# Patient Record
Sex: Female | Born: 1950 | Race: White | Hispanic: No | Marital: Single | Smoking: Former smoker
Health system: Southern US, Community
[De-identification: ages and names within clinical notes are randomized; demographics above are authoritative.]

## PROBLEM LIST (undated history)

## (undated) DIAGNOSIS — F329 Major depressive disorder, single episode, unspecified: Secondary | ICD-10-CM

## (undated) DIAGNOSIS — Z780 Asymptomatic menopausal state: Secondary | ICD-10-CM

## (undated) DIAGNOSIS — C801 Malignant (primary) neoplasm, unspecified: Secondary | ICD-10-CM

## (undated) DIAGNOSIS — O24419 Gestational diabetes mellitus in pregnancy, unspecified control: Secondary | ICD-10-CM

## (undated) DIAGNOSIS — K5790 Diverticulosis of intestine, part unspecified, without perforation or abscess without bleeding: Secondary | ICD-10-CM

## (undated) DIAGNOSIS — Z9889 Other specified postprocedural states: Secondary | ICD-10-CM

## (undated) DIAGNOSIS — D225 Melanocytic nevi of trunk: Secondary | ICD-10-CM

## (undated) DIAGNOSIS — J45909 Unspecified asthma, uncomplicated: Secondary | ICD-10-CM

## (undated) DIAGNOSIS — E782 Mixed hyperlipidemia: Secondary | ICD-10-CM

## (undated) DIAGNOSIS — R112 Nausea with vomiting, unspecified: Secondary | ICD-10-CM

## (undated) DIAGNOSIS — Z7184 Encounter for health counseling related to travel: Secondary | ICD-10-CM

## (undated) DIAGNOSIS — E039 Hypothyroidism, unspecified: Secondary | ICD-10-CM

## (undated) DIAGNOSIS — L719 Rosacea, unspecified: Secondary | ICD-10-CM

## (undated) DIAGNOSIS — M8580 Other specified disorders of bone density and structure, unspecified site: Secondary | ICD-10-CM

## (undated) DIAGNOSIS — M5432 Sciatica, left side: Secondary | ICD-10-CM

## (undated) DIAGNOSIS — F32A Depression, unspecified: Secondary | ICD-10-CM

## (undated) DIAGNOSIS — F419 Anxiety disorder, unspecified: Secondary | ICD-10-CM

## (undated) DIAGNOSIS — C44310 Basal cell carcinoma of skin of unspecified parts of face: Secondary | ICD-10-CM

## (undated) DIAGNOSIS — D2370 Other benign neoplasm of skin of unspecified lower limb, including hip: Secondary | ICD-10-CM

## (undated) DIAGNOSIS — F909 Attention-deficit hyperactivity disorder, unspecified type: Secondary | ICD-10-CM

## (undated) HISTORY — DX: Rosacea, unspecified: L71.9

## (undated) HISTORY — DX: Attention-deficit hyperactivity disorder, unspecified type: F90.9

## (undated) HISTORY — PX: OTHER SURGICAL HISTORY: SHX169

## (undated) HISTORY — DX: Sciatica, left side: M54.32

## (undated) HISTORY — DX: Gestational diabetes mellitus in pregnancy, unspecified control: O24.419

## (undated) HISTORY — DX: Melanocytic nevi of trunk: D22.5

## (undated) HISTORY — DX: Asymptomatic menopausal state: Z78.0

## (undated) HISTORY — DX: Hypothyroidism, unspecified: E03.9

## (undated) HISTORY — PX: TONSILLECTOMY AND ADENOIDECTOMY: SUR1326

## (undated) HISTORY — DX: Depression, unspecified: F32.A

## (undated) HISTORY — DX: Major depressive disorder, single episode, unspecified: F32.9

## (undated) HISTORY — DX: Diverticulosis of intestine, part unspecified, without perforation or abscess without bleeding: K57.90

## (undated) HISTORY — DX: Other benign neoplasm of skin of unspecified lower limb, including hip: D23.70

## (undated) HISTORY — PX: BROW LIFT: SHX178

## (undated) HISTORY — PX: DILATION AND CURETTAGE OF UTERUS: SHX78

## (undated) HISTORY — DX: Malignant (primary) neoplasm, unspecified: C80.1

## (undated) HISTORY — DX: Unspecified asthma, uncomplicated: J45.909

## (undated) HISTORY — PX: SKIN CANCER EXCISION: SHX779

## (undated) HISTORY — DX: Anxiety disorder, unspecified: F41.9

## (undated) HISTORY — DX: Mixed hyperlipidemia: E78.2

## (undated) HISTORY — DX: Other specified disorders of bone density and structure, unspecified site: M85.80

## (undated) HISTORY — DX: Basal cell carcinoma of skin of unspecified parts of face: C44.310

## (undated) HISTORY — DX: Encounter for health counseling related to travel: Z71.84

---

## 1998-11-23 ENCOUNTER — Other Ambulatory Visit: Admission: RE | Admit: 1998-11-23 | Discharge: 1998-11-23 | Payer: Self-pay | Admitting: Family Medicine

## 1999-04-11 ENCOUNTER — Encounter: Admission: RE | Admit: 1999-04-11 | Discharge: 1999-04-11 | Payer: Self-pay | Admitting: Family Medicine

## 1999-04-11 ENCOUNTER — Encounter: Payer: Self-pay | Admitting: Family Medicine

## 1999-04-11 ENCOUNTER — Encounter: Payer: Self-pay | Admitting: Sports Medicine

## 2000-02-04 ENCOUNTER — Encounter (INDEPENDENT_AMBULATORY_CARE_PROVIDER_SITE_OTHER): Payer: Self-pay | Admitting: Specialist

## 2000-02-04 ENCOUNTER — Other Ambulatory Visit: Admission: RE | Admit: 2000-02-04 | Discharge: 2000-02-04 | Payer: Self-pay | Admitting: Gastroenterology

## 2000-03-04 ENCOUNTER — Other Ambulatory Visit: Admission: RE | Admit: 2000-03-04 | Discharge: 2000-03-04 | Payer: Self-pay | Admitting: *Deleted

## 2000-03-04 ENCOUNTER — Encounter (INDEPENDENT_AMBULATORY_CARE_PROVIDER_SITE_OTHER): Payer: Self-pay | Admitting: Specialist

## 2001-01-05 ENCOUNTER — Other Ambulatory Visit: Admission: RE | Admit: 2001-01-05 | Discharge: 2001-01-05 | Payer: Self-pay | Admitting: Family Medicine

## 2002-02-04 ENCOUNTER — Other Ambulatory Visit: Admission: RE | Admit: 2002-02-04 | Discharge: 2002-02-04 | Payer: Self-pay | Admitting: Family Medicine

## 2002-02-04 ENCOUNTER — Encounter: Admission: RE | Admit: 2002-02-04 | Discharge: 2002-02-04 | Payer: Self-pay | Admitting: Family Medicine

## 2002-02-04 ENCOUNTER — Encounter: Payer: Self-pay | Admitting: Family Medicine

## 2003-03-17 ENCOUNTER — Other Ambulatory Visit: Admission: RE | Admit: 2003-03-17 | Discharge: 2003-03-17 | Payer: Self-pay | Admitting: Family Medicine

## 2004-11-22 ENCOUNTER — Other Ambulatory Visit: Admission: RE | Admit: 2004-11-22 | Discharge: 2004-11-22 | Payer: Self-pay | Admitting: Family Medicine

## 2005-07-10 ENCOUNTER — Ambulatory Visit: Payer: Self-pay | Admitting: Gastroenterology

## 2005-07-24 ENCOUNTER — Ambulatory Visit: Payer: Self-pay | Admitting: Gastroenterology

## 2008-04-25 ENCOUNTER — Other Ambulatory Visit: Admission: RE | Admit: 2008-04-25 | Discharge: 2008-04-25 | Payer: Self-pay | Admitting: Internal Medicine

## 2008-05-04 ENCOUNTER — Ambulatory Visit (HOSPITAL_COMMUNITY): Admission: RE | Admit: 2008-05-04 | Discharge: 2008-05-04 | Payer: Self-pay | Admitting: Internal Medicine

## 2008-12-14 ENCOUNTER — Ambulatory Visit: Payer: Self-pay | Admitting: Sports Medicine

## 2008-12-14 DIAGNOSIS — M722 Plantar fascial fibromatosis: Secondary | ICD-10-CM | POA: Insufficient documentation

## 2008-12-14 DIAGNOSIS — Q6689 Other  specified congenital deformities of feet: Secondary | ICD-10-CM | POA: Insufficient documentation

## 2008-12-14 DIAGNOSIS — M7660 Achilles tendinitis, unspecified leg: Secondary | ICD-10-CM | POA: Insufficient documentation

## 2009-03-15 ENCOUNTER — Ambulatory Visit: Payer: Self-pay | Admitting: Sports Medicine

## 2009-03-15 DIAGNOSIS — M2040 Other hammer toe(s) (acquired), unspecified foot: Secondary | ICD-10-CM | POA: Insufficient documentation

## 2009-03-15 HISTORY — DX: Other hammer toe(s) (acquired), unspecified foot: M20.40

## 2009-04-06 ENCOUNTER — Ambulatory Visit (HOSPITAL_COMMUNITY): Admission: RE | Admit: 2009-04-06 | Discharge: 2009-04-06 | Payer: Self-pay | Admitting: Internal Medicine

## 2009-04-17 ENCOUNTER — Ambulatory Visit: Payer: Self-pay | Admitting: Sports Medicine

## 2009-04-17 DIAGNOSIS — M7750 Other enthesopathy of unspecified foot: Secondary | ICD-10-CM | POA: Insufficient documentation

## 2010-03-06 ENCOUNTER — Ambulatory Visit: Payer: Self-pay | Admitting: Sports Medicine

## 2010-03-06 DIAGNOSIS — M25559 Pain in unspecified hip: Secondary | ICD-10-CM | POA: Insufficient documentation

## 2010-05-29 NOTE — Assessment & Plan Note (Signed)
Summary: 1/2 MARATHON IN DEC/RT. BACK, HIP KNEE   Vital Signs:  Patient profile:   60 year old female Height:      66 inches Weight:      170 pounds BMI:     27.54 BP sitting:   119 / 80  Vitals Entered By: Lillia Pauls [name] (March 06, 2010 9:02 AM)   History of Present Illness: 60 yo female here for for right sided gluteal pain and left sided hip weakness.  pt has been increasing her exercise regimen  more recently helping her become prepared for a 1/2 marathon she has in December. Pt states at the end of running on the days she runs she will have right sided gluteal pain the seems to cause some stiffness but usually imprves the next day or so.  Pt states she has had this for a while and denies that the pain is getting any worse. Pt though would like to know what it is and see if anyway to help it.  Pt states the pain seems mildly improved with orthotics and pt goes to intergrated therapy multiple times a week. New problem left sided hip weakness started after a 11 mile run on saturday. Pt states did not start til after the run and pt was at the store and noticed some trouble lifting the left leg while walking and then through the night.  Pt states on monday had more strength but then had some pain in the left hip and a little bit in the back.  Denies any pain shooting down the leg, numbness, tingling loss of function or bowel or bladder control. Of note pt does have hx of left sided bulging disc seen on MRI in 2003 at L4-L5  Preventive Screening-Counseling & Management  Alcohol-Tobacco     Smoking Status: never  Current Medications (verified): 1)  None  Allergies (verified): No Known Drug Allergies  Past History:  Past medical, surgical, family and social histories (including risk factors) reviewed, and no changes noted (except as noted below).  Family History: Reviewed history and no changes required.  Social History: Reviewed history and no changes required. Smoking  Status:  never  Review of Systems       denies fever, chills, nausea, vomiting, diarrhea or constipation   Physical Exam  General:  Well-developed,well-nourished,in no acute distress; alert,appropriate and cooperative throughout examination Msk:  right hip tight hamstrings with tender to palpation at insertion of hamstrings.  No swelling or discolration no pain with internal or external rotation of the hip, 5/5 strength, NVI, DTR intact, abduction and adduction good strength. Left hip:  no pain with internal or external rotation, mild + Faber, no gross deformity, negative SLT, mild focal finding of weakness of 4/5 strength with abduction, otherwise NVI.  widening of forefoot drop of MT arch bilat there is splaying of toes 1 and 2 There is some hammering of toes 4 and 5  review of Xrays did show a fusion at L1/2 but more degenerative change at L4/5 asnd 5/S1   Impression & Recommendations:  Problem # 1:  PAIN IN JOINT PELVIC REGION AND THIGH (ICD-719.45) likely due to poor hip abductor/gluteal medius strength.  No red flags seen that would think of back .  Pt likely ocmpensating on right side wich is giving tightening of quadratus laborum and hamstrings as well.  Pt already wearing custom orthotics, gave pt exercises to do to help strengthen muscles and told pt to continue integrated therapy.  Pt can  continue to do her same running regimen but Sacred need to back off on conditioning she has weekly for a little time. NSAIDS as needed.   Pt to f/u as needed    Orders Added: 1)  Est. Patient Level III [16109]

## 2010-07-11 ENCOUNTER — Encounter: Payer: Self-pay | Admitting: Gastroenterology

## 2010-07-17 NOTE — Letter (Signed)
Summary: Colonoscopy Letter  [facility]  [address] [place], [place] [address]   Phone: [phone]  Fax: [phone]      July 11, 2010 MRN: [id number]   Shannon [name] [address] [place], [place]  [address]   Dear [name],   According to your medical record, it is time for you to schedule a Colonoscopy. The American Cancer Society recommends this procedure as a method to detect early colon cancer. Patients with a family history of colon cancer, or a personal history of colon polyps or inflammatory bowel disease are at increased risk.  This letter has been generated based on the recommendations made at the time of your procedure. If you feel that in your particular situation this [place]ag[place] no longer apply, please contact our office.  Please call our office at [phone] to schedule this appointment or to update your records at your earliest convenience.  Thank you for cooperating with [place] to provide you with the very best care possible.   Sincerely,  Judie Petit T. Russella Dar, M.D.  [facility] Gastroenterology Division [phone]

## 2010-07-26 ENCOUNTER — Encounter (INDEPENDENT_AMBULATORY_CARE_PROVIDER_SITE_OTHER): Payer: Self-pay | Admitting: *Deleted

## 2010-07-31 NOTE — Letter (Signed)
Summary: Pre Visit Letter Revised  [facility]  [address] [place], [place] [address]   Phone: [phone]  Fax: [phone]        07/26/2010 MRN: [id number] Shannon [name] [address] [place], [place]  [address]             Procedure Date:  09/13/2010 @ 9:30   Recall colon-Dr. Russella Dar   Welcome to the Gastroenterology Division at [facility].    You are scheduled to see a nurse for your pre-procedure visit on 09/03/2010 at 2:00 on the 3rd floor at [facility], [address]  We ask that you try to arrive at our office 15 minutes prior to your appointment time to allow for check-in.  Please take a minute to review the attached form.  If you answer "Yes" to one or more of the questions on the first page, we ask that you call the person listed at your earliest opportunity.  If you answer "No" to all of the questions, please complete the rest of the form and bring it to your appointment.    Your nurse visit will consist of discussing your medical and surgical history, your immediate family medical history, and your medications.   If you are unable to list all of your medications on the form, please bring the medication bottles to your appointment and we will list them.  We will need to be aware of both prescribed and over the counter drugs.  We will need to know exact dosage information as well.    Please be prepared to read and sign documents such as consent forms, a financial agreement, and acknowledgement forms.  If necessary, and with your consent, a friend or relative is welcome to sit-in on the nurse visit with you.  Please bring your insurance card so that we Kadie make a copy of it.  If your insurance requires a referral to see a specialist, please bring your referral form from your primary care physician.  No co-pay is required for this nurse visit.     If you cannot keep your appointment, please call [phone] to cancel or reschedule prior to your  appointment date.  This allows [place] the opportunity to schedule an appointment for another patient in need of care.    Thank you for choosing [facility] for your medical needs.  We appreciate the opportunity to care for you.  Please visit [place] at our website  to learn more about our practice.  Sincerely, The Gastroenterology Division

## 2010-08-20 ENCOUNTER — Ambulatory Visit (AMBULATORY_SURGERY_CENTER): Payer: BC Managed Care – PPO | Admitting: *Deleted

## 2010-08-20 VITALS — Ht 66.0 in | Wt 179.2 lb

## 2010-08-20 DIAGNOSIS — Z8371 Family history of colonic polyps: Secondary | ICD-10-CM

## 2010-08-20 DIAGNOSIS — Z800 Family history of malignant neoplasm of digestive organs: Secondary | ICD-10-CM

## 2010-08-20 MED ORDER — PEG-KCL-NACL-NASULF-NA ASC-C 100 G PO SOLR: ORAL | Status: DC

## 2010-08-21 ENCOUNTER — Encounter: Payer: Self-pay | Admitting: Gastroenterology

## 2010-09-05 ENCOUNTER — Encounter: Payer: Self-pay | Admitting: Gastroenterology

## 2010-09-06 ENCOUNTER — Ambulatory Visit (AMBULATORY_SURGERY_CENTER): Payer: BC Managed Care – PPO | Admitting: Gastroenterology

## 2010-09-06 ENCOUNTER — Encounter: Payer: Self-pay | Admitting: Gastroenterology

## 2010-09-06 ENCOUNTER — Other Ambulatory Visit: Payer: Self-pay | Admitting: Gastroenterology

## 2010-09-06 ENCOUNTER — Other Ambulatory Visit (INDEPENDENT_AMBULATORY_CARE_PROVIDER_SITE_OTHER): Payer: BC Managed Care – PPO

## 2010-09-06 DIAGNOSIS — R197 Diarrhea, unspecified: Secondary | ICD-10-CM

## 2010-09-06 DIAGNOSIS — Z860100 Personal history of colon polyps, unspecified: Secondary | ICD-10-CM

## 2010-09-06 DIAGNOSIS — Z800 Family history of malignant neoplasm of digestive organs: Secondary | ICD-10-CM

## 2010-09-06 DIAGNOSIS — K5730 Diverticulosis of large intestine without perforation or abscess without bleeding: Secondary | ICD-10-CM

## 2010-09-06 DIAGNOSIS — Z1211 Encounter for screening for malignant neoplasm of colon: Secondary | ICD-10-CM

## 2010-09-06 MED ORDER — SODIUM CHLORIDE 0.9 % IV SOLN: 500.0000 mL | INTRAVENOUS | Status: DC

## 2010-09-06 NOTE — Patient Instructions (Signed)
Resume all medications. Information given for high fiber diet and diverticulosis

## 2010-09-06 NOTE — Progress Notes (Signed)
Pressure applied to abdomen to reach cecum.  

## 2010-09-07 ENCOUNTER — Telehealth: Payer: Self-pay | Admitting: *Deleted

## 2010-09-07 LAB — GLIA (IGA/G) + TTG IGA
Gliadin IgA: 7 U/mL (ref <20)
Gliadin IgG: 5.2 U/mL (ref <20)

## 2010-09-07 NOTE — Telephone Encounter (Signed)
Follow up Call- Patient questions:  Do you have a fever, pain , or abdominal swelling? no Pain Score  0 *  Have you tolerated food without any problems? yes  Have you been able to return to your normal activities? yes  Do you have any questions about your discharge instructions: Diet   no Medications  no Follow up visit  no  Do you have questions or concerns about your Care? yes   Patient states that she thought that she was discharged too soon.  States could barely walk when she got home.  Supervisor notified.  Actions: * If pain score is 4 or above: No action needed, pain <4.

## 2010-09-13 ENCOUNTER — Other Ambulatory Visit: Payer: Self-pay | Admitting: Gastroenterology

## 2010-09-13 ENCOUNTER — Telehealth: Payer: Self-pay | Admitting: Gastroenterology

## 2010-09-13 NOTE — Telephone Encounter (Signed)
Left message for patient to call back  

## 2010-09-14 NOTE — Telephone Encounter (Signed)
All questions about celiac disease answered.  She will call back for further questions or concerns

## 2010-09-14 NOTE — Telephone Encounter (Signed)
Left message for patient to call back  

## 2010-09-26 ENCOUNTER — Ambulatory Visit (INDEPENDENT_AMBULATORY_CARE_PROVIDER_SITE_OTHER): Payer: BC Managed Care – PPO | Admitting: Sports Medicine

## 2010-09-26 ENCOUNTER — Encounter: Payer: Self-pay | Admitting: Sports Medicine

## 2010-09-26 VITALS — BP 123/80

## 2010-09-26 DIAGNOSIS — M25561 Pain in right knee: Secondary | ICD-10-CM

## 2010-09-26 DIAGNOSIS — M25559 Pain in unspecified hip: Secondary | ICD-10-CM

## 2010-09-26 DIAGNOSIS — M2040 Other hammer toe(s) (acquired), unspecified foot: Secondary | ICD-10-CM

## 2010-09-26 DIAGNOSIS — M7750 Other enthesopathy of unspecified foot: Secondary | ICD-10-CM

## 2010-09-26 DIAGNOSIS — M25569 Pain in unspecified knee: Secondary | ICD-10-CM

## 2010-09-26 HISTORY — DX: Pain in unspecified knee: M25.569

## 2010-09-26 NOTE — Assessment & Plan Note (Signed)
I reassured her that I saw no signs of arthritis of the left hip. I did encourage her to restart exercises for the hip to include abduction rotation and step up exercises.  She also needs to continue doing stretches to loosen mostly hip rotation and the SI joints.  Recheck this in 2-3 months.

## 2010-09-26 NOTE — Assessment & Plan Note (Signed)
Please correlate with her transverse arch breakdown. We will add support to her sports orthotics. Consider making a dress orthotic for other shoes. I did not feel that surgery wasn't an option at this point.

## 2010-09-26 NOTE — Assessment & Plan Note (Signed)
Metatarsal cookie tried to give water support for the foot when she is with metatarsal pad. If this works well I think we should have metatarsal cookies to her sandals and other shoes.

## 2010-09-26 NOTE — Progress Notes (Signed)
  Subjective:    Patient ID: Shannon [name], female    DOB: 11/03/1950, 60 y.o.   MRN: [id number]  HPI  Pt here for evaluation of rt knee pain which goes across anterior portion knee.  Pain started around the same time she began cycling 6 years ago.  Only bothers her sometimes with cycling and if her form is not good when working legs in the gym.   She is planning a bike trip across country March and April of 2013.  Completed 1/2 marathon last fall.   Knee pain is worse with running.  Her other issue is left hip pain. This did resolve with a series of exercises she was given a visit last year. She has not been doing the exercises recently and the pain is a similar pattern. It starts on the outside of the left hip and sometimes radiates down the outer leg.  Third issue is that she has bilateral hammertoes. Her feet have felt better since being put in orthotics 2 years ago. However she only uses those were running in in her regular walking shoes and biking she she's getting some pressure over the feet. Metatarsal pad to help relieve some of the stress.  Review of Systems     Objective:   Physical Exam     Bilat cavus foot Hammering of toes 4 and 5 bilat but L > R Loss of transverse arch L > R  Weak abduction bilat Full flexion and extension of knees bilat Good quad strength RT Knee: Normal to inspection with no erythema or effusion or obvious bony abnormalities. Palpation normal with no warmth or joint line tenderness or patellar tenderness or condyle tenderness. ROM normal in flexion and extension and lower leg rotation. Ligaments with solid consistent endpoints including ACL, PCL, LCL, MCL. Negative Mcmurray's and provocative meniscal tests. Non painful patellar compression. Patellar and quadriceps tendons unremarkable. Hamstring and quadriceps strength is normal. Crepitation over rt knee with bending   Full hip flexion on lt Normal IR and ER rt and left hip Good hip flexion  strength bilaterally Adductor strength good SI joint moves better on rt than lt     Assessment & Plan:

## 2010-09-26 NOTE — Assessment & Plan Note (Signed)
I think the recurrence of right anterior knee pain is also related to her hip abduction weakness. She does need good arch support in all of her shoes and that probably contributes as well. She is given exercises for both the hip and the quadriceps.

## 2010-09-26 NOTE — Patient Instructions (Signed)
Do suggested hip exercises on both sides daily- abduction, rotation, and step up exercises. Pick 2 stretches for buttocks and low back area and do these daily

## 2011-03-26 ENCOUNTER — Ambulatory Visit (INDEPENDENT_AMBULATORY_CARE_PROVIDER_SITE_OTHER): Payer: BC Managed Care – PPO | Admitting: Sports Medicine

## 2011-03-26 VITALS — BP 100/60

## 2011-03-26 DIAGNOSIS — M7750 Other enthesopathy of unspecified foot: Secondary | ICD-10-CM

## 2011-03-26 NOTE — Assessment & Plan Note (Signed)
Her cycling shoes appear to be high-quality inappropriate that. She needs metatarsal pads in both shoes. Plan to add metatarsal cookie on the left shoe and metatarsal pad on the right shoe. We'll add this to the insoles that come with the cycling shoes. This calms we'll try this for 2-3 weeks and return for custom orthotics if she feels like she is not getting sufficient comfort.  We will followup as needed prior to this long bike ride

## 2011-03-26 NOTE — Patient Instructions (Signed)
Thank you for coming in today. Wear the pads in your bike shoes.  If it fits well stick it down.  Ride and see how it goes for 2 weeks.  Keep stuffing your shoes at night.  If what we did does not work come back.  Come back for orthotics soon.

## 2011-03-26 NOTE — Progress Notes (Signed)
[name] presents to clinic today for left foot pain with or cycling shoes. She is an active cyclist. She plans to right across [place] in March and April of 2013.  She recently had a new bite fit and purchased some new bicycling shoes.  She noted after 40 mile ride the other day she had some left forefoot pain along her metatarsal head. She comes to clinic today to wonder if she needs orthotics for her cycling shoes. She does have custom cushioned orthotics in her running shoes with metatarsal pads.    PMH reviewed.  ROS as above otherwise neg Medications reviewed. Current Outpatient Prescriptions  Medication Sig Dispense Refill  . Azelaic Acid (FINACEA) 15 % cream Apply topically 2 (two) times daily. After skin is thoroughly washed and patted dry, gently but thoroughly massage a thin film of azelaic acid cream into the affected area twice daily, in the morning and evening.       . Diclofenac Sodium (SOLARAZE) 3 % GEL Place onto the skin. 2xday / 3 month topically       . L-Glutamic Acid HCl POWD by Does not apply route. By mouth daily       . Levothyroxine Sodium (TIROSINT) 100 MCG CAPS Take by mouth. By mouth daily       . Cholecalciferol (VITAMIN D) 2000 UNITS CAPS Take by mouth daily.        . [employer]S BIOTIN PO Take 10,000 mcg by mouth daily.        [name] dextroamphetamine (DEXTROSTAT) 10 MG tablet Take 10 mg by mouth as needed. ADD       . escitalopram (LEXAPRO) 5 MG tablet Take 5 mg by mouth daily.        . Fish Oil OIL by Does not apply route daily. 1 tsp daily       . levothyroxine (SYNTHROID, LEVOTHROID) 75 MCG tablet Take 75 mcg by mouth daily.        . Probiotic Product (PROBIOTIC PO) Take 1 capsule by mouth daily.        [name] tretinoin (RETIN-A) 0.025 % cream Apply topically at bedtime.        [name] UNABLE TO FIND Take 1 capsule by mouth 2 (two) times daily. Med Name: GABA 100-200 mg       . UNABLE TO FIND Med Name: Iodoral 12.5 mg. Every other day (supplement)        Current  Facility-Administered Medications  Medication Dose Route Frequency Provider Last Rate Last Dose  . 0.9 %  sodium chloride infusion  500 mL Intravenous Continuous Eliezer Bottom., MD,FACG        Exam:  BP 100/60 Gen: Well NAD MSK: Collapse of the transverse arch with splayed toes bilaterally. Some tenderness to palpation along the metatarsal heads.   She has a very broad forefoot bilaterally She also has more lateral changes of early hammertoes

## 2011-03-27 NOTE — Progress Notes (Signed)
Addended by: Annita Brod on: 03/27/2011 02:41 PM   Modules accepted: Orders

## 2011-03-28 ENCOUNTER — Encounter: Payer: Self-pay | Admitting: Gastroenterology

## 2011-03-28 ENCOUNTER — Ambulatory Visit (INDEPENDENT_AMBULATORY_CARE_PROVIDER_SITE_OTHER): Payer: BC Managed Care – PPO | Admitting: Gastroenterology

## 2011-03-28 VITALS — BP 102/70 | HR 68 | Ht 66.0 in | Wt 168.0 lb

## 2011-03-28 DIAGNOSIS — L309 Dermatitis, unspecified: Secondary | ICD-10-CM

## 2011-03-28 DIAGNOSIS — K5900 Constipation, unspecified: Secondary | ICD-10-CM

## 2011-03-28 DIAGNOSIS — L259 Unspecified contact dermatitis, unspecified cause: Secondary | ICD-10-CM

## 2011-03-28 NOTE — Progress Notes (Signed)
History of Present Illness: This is a 60 year old female who relates a several year history of recurrent perianal irritation and rashes. She states she has been diagnosed with candida and skin fissure by her PCP, dermatologist and naturalist. Her symptoms have responded to steroid creams and anti-candidal creams at various times in the past. She has no perianal symptoms at this time. She brings with her food allergy testing and stool assays of digestion/absorption, gut immunity and gut metabolic function ordered by her naturalist in [place]. Calprotectin and pancreatic elastase 1 levels were normal. She notes a frequent difficulty in fully evacuating her stool. She underwent colonoscopy in [place] 2012 that  showed a tortuous and redundant colon to the ascending colon with mild diverticulosis and no other abnormalities. Denies weight loss, abdominal pain, diarrhea, change in stool caliber, melena, hematochezia, nausea, vomiting, dysphagia, reflux symptoms, chest pain.  Current Medications, Allergies, Past Medical History, Past Surgical History, Family History and Social History were reviewed in [employer] record.  Physical Exam: General: Well developed , well nourished, no acute distress Head: Normocephalic and atraumatic Eyes:  sclerae anicteric, EOMI Ears: Normal auditory acuity Mouth: No deformity or lesions Lungs: Clear throughout to auscultation Heart: Regular rate and rhythm; no murmurs, rubs or bruits Abdomen: Soft, non tender and non distended. No masses, hepatosplenomegaly or hernias noted. Normal Bowel sounds Rectal: Normal perianal skin, no external lesions, no internal lesions, Hemoccult-negative brown stool in the vault Musculoskeletal: Symmetrical with no gross deformities  Pulses:  Normal pulses noted Extremities: No clubbing, cyanosis, edema or deformities noted Neurological: Alert oriented x 4, grossly nonfocal Psychological:  Alert and cooperative. Normal mood  and affect  Assessment and Recommendations:  1. Recurrent perianal dermatitis. Her exam is normal today and she is asymptomatic. I advised her to followup with her dermatologist for recurrent symptoms as this appear to be a skin disorder.  2. Mild chronic constipation. Increase dietary fiber and water intake.

## 2011-03-28 NOTE — Patient Instructions (Addendum)
Continue current medications. cc: Lucky Cowboy, MD

## 2011-03-29 ENCOUNTER — Encounter: Payer: Self-pay | Admitting: Gastroenterology

## 2011-04-16 ENCOUNTER — Ambulatory Visit (INDEPENDENT_AMBULATORY_CARE_PROVIDER_SITE_OTHER): Payer: BC Managed Care – PPO | Admitting: Sports Medicine

## 2011-04-16 VITALS — BP 104/60

## 2011-04-16 DIAGNOSIS — M7750 Other enthesopathy of unspecified foot: Secondary | ICD-10-CM

## 2011-04-16 NOTE — Progress Notes (Signed)
  Subjective:    Patient ID: Shannon [name], female    DOB: 1951-02-11, 60 y.o.   MRN: [id number]  HPI Patient returns for custom orthotics to use on her bike shoes She plans right across the country and she is getting foot pain she rides at present She has good relief with her custom orthotics and walking and running shoes but she got doing much of running activity at this point In the back she has some metatarsalgia has been worse Her Achilles tendinitis and plantar fasciitis have pretty much resolved   Review of Systems     Objective:   Physical Exam No acute distress  Both forefeet show significant breakdown There are hammertoes laterally left more so than right Left transverse arch is fully collapsed but the metatarsal heads are mobile Right transverse arch is collapsed as well Right metatarsal heads 3 and 4 soon to be subluxed plantar area through the capsule Tenderness to palpation on standing or pressure on the forefoot       Assessment & Plan:

## 2011-04-16 NOTE — Assessment & Plan Note (Signed)
With chronic forefoot pain we decided to make her a special orthotic to set her bike shoes to see if we can lessen this She will continue to use orthotics in her walking and running shoes  Patient was fitted for a standard, cushioned, semi-rigid orthotic.  The orthotic was heated and the patient stood on the orthotic blank positioned on the orthotic stand. The patient was positioned in subtalar neutral position and 10 degrees of ankle dorsiflexion in a weight bearing stance. After molding, a stable [employer] EVA base was applied to the orthotic blank.   The blank was ground to a stable position for weight bearing. Size:8 dress orthotic blank base: [place] [employer]: none additional orthotic padding: small MT pads bilat onto orthotic surface  These felt comfortable after completion She will test them with biking to see if they will take the pressure off fore foot

## 2011-09-11 ENCOUNTER — Ambulatory Visit (INDEPENDENT_AMBULATORY_CARE_PROVIDER_SITE_OTHER): Payer: BC Managed Care – PPO | Admitting: Sports Medicine

## 2011-09-11 ENCOUNTER — Encounter: Payer: Self-pay | Admitting: Sports Medicine

## 2011-09-11 VITALS — BP 114/76 | HR 48 | Ht 66.0 in | Wt 164.0 lb

## 2011-09-11 DIAGNOSIS — M79671 Pain in right foot: Secondary | ICD-10-CM

## 2011-09-11 DIAGNOSIS — M79609 Pain in unspecified limb: Secondary | ICD-10-CM

## 2011-09-11 MED ORDER — MELOXICAM 15 MG PO TABS: ORAL_TABLET | ORAL | Status: DC

## 2011-09-11 MED ORDER — CAPSAICIN 0.1 % EX CREA: TOPICAL_CREAM | CUTANEOUS | Status: DC

## 2011-09-11 NOTE — Assessment & Plan Note (Addendum)
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  (1st MTP/medial cuneiform) NSAIDS prn. Topical capsaicin. Arch strap vs Ankle sleeve. RTC and inject 1st MT/medial cuneiform joint under guidance if no better.  Pt desires additional orthotics, we can have her f/u for this.

## 2011-09-11 NOTE — Progress Notes (Signed)
  Subjective:    Patient ID: Shannon [name], female    DOB: Jul 30, 1950, 61 y.o.   MRN: [id number]  HPI Has several issues to discuss, main issue is 10 months pain over RT TMT joint dorsum after inversion injury.  Hasn't tried any other tx for this.  Has custom orthotics that are comfortable.  Described as burning.  No numbness on plantar foot.  She completed bike ride x [place] Amount of burning is affected by shoe type   Review of Systems    No fevers, chills, night sweats, weight loss, chest pain, or shortness of breath.  Objective:   Physical Exam General:  Well developed, well nourished, and in no acute distress. Neuro:  Alert and oriented x3, extra-ocular muscles intact. Skin: Warm and dry, no rashes noted. Respiratory:  Not using accessory muscles, speaking in full sentences. Right  Ankle: No visible erythema or swelling. Range of motion is full in all directions. Strength is 5/5 in all directions. Stable lateral and medial ligaments; squeeze test and kleiger test unremarkable; Talar dome nontender; No pain at base of 5th MT; No tenderness over cuboid; No tenderness over N spot or navicular prominence No tenderness on posterior aspects of lateral and medial malleolus No sign of peroneal tendon subluxations; Negative tarsal tunnel tinel's Able to walk 4 steps.   TTP RT 1st MT/medial cuneiform joint.   TMT bossing.   Cavus foot.     Assessment & Plan:

## 2011-09-11 NOTE — Patient Instructions (Signed)
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

## 2012-03-10 ENCOUNTER — Encounter: Payer: Self-pay | Admitting: Sports Medicine

## 2012-03-10 ENCOUNTER — Ambulatory Visit (INDEPENDENT_AMBULATORY_CARE_PROVIDER_SITE_OTHER): Payer: BC Managed Care – PPO | Admitting: Sports Medicine

## 2012-03-10 VITALS — BP 117/79 | HR 54

## 2012-03-10 DIAGNOSIS — M7750 Other enthesopathy of unspecified foot: Secondary | ICD-10-CM

## 2012-03-10 DIAGNOSIS — M79673 Pain in unspecified foot: Secondary | ICD-10-CM

## 2012-03-10 DIAGNOSIS — M79609 Pain in unspecified limb: Secondary | ICD-10-CM

## 2012-03-10 NOTE — Progress Notes (Signed)
Patient ID: Shannon [name], female   DOB: Apr 28, 1951, 60 y.o.   MRN: [id number]  Patient returns for Bilateral foot pain Orthotics have worked well for past 2 yrs Now these are not keeping her forefoot from hurting after walking or jogging MT pads still help  PF sxs have stayed in control as long as she uses orthotics  AT is OK now  P Exam Bilateral forefoot pain on deep palpation over MTs No abnormal calluses now Standing shows marked loww of transverse arch bilat Splaying of toes with left > RT Hammertoes laterally  Standings shows loss of long arch

## 2012-03-10 NOTE — Assessment & Plan Note (Signed)
New orthotics created  Patient was fitted for a : standard, cushioned, semi-rigid orthotic. The orthotic was heated and afterward the patient stood on the orthotic blank positioned on the orthotic stand. The patient was positioned in subtalar neutral position and 10 degrees of ankle dorsiflexion in a weight bearing stance. After completion of molding, a stable base was applied to the orthotic blank. The blank was ground to a stable position for weight bearing. Size: 9 red EVA Base: blue EVA Posting: MT padding Additional orthotic padding: o  Time 40 mins  Walking gait was comfortable in these and in neutral position

## 2012-03-10 NOTE — Assessment & Plan Note (Signed)
New MT pads added to orthotics

## 2012-04-25 ENCOUNTER — Ambulatory Visit (INDEPENDENT_AMBULATORY_CARE_PROVIDER_SITE_OTHER): Payer: BC Managed Care – PPO | Admitting: Family Medicine

## 2012-04-25 VITALS — BP 116/73 | HR 73 | Temp 99.7°F | Resp 18 | Ht 66.5 in | Wt 181.0 lb

## 2012-04-25 DIAGNOSIS — J111 Influenza due to unidentified influenza virus with other respiratory manifestations: Secondary | ICD-10-CM

## 2012-04-25 DIAGNOSIS — R059 Cough, unspecified: Secondary | ICD-10-CM

## 2012-04-25 DIAGNOSIS — R05 Cough: Secondary | ICD-10-CM

## 2012-04-25 DIAGNOSIS — R509 Fever, unspecified: Secondary | ICD-10-CM

## 2012-04-25 LAB — POCT INFLUENZA A/B
Influenza A, POC: POSITIVE
Influenza B, POC: NEGATIVE

## 2012-04-25 MED ORDER — HYDROCODONE-HOMATROPINE 5-1.5 MG/5ML PO SYRP: ORAL_SOLUTION | ORAL | Status: DC

## 2012-04-25 MED ORDER — OSELTAMIVIR PHOSPHATE 75 MG PO CAPS: 75.0000 mg | ORAL_CAPSULE | Freq: Two times a day (BID) | ORAL | Status: DC

## 2012-04-25 NOTE — Progress Notes (Signed)
Subjective:    Patient ID: Shannon [name], female    DOB: August 27, 1950, 61 y.o.   MRN: [id number]  HPI Shannon [name] is a 61 y.o. female Started 2 nights ago - cough, burning with cough.  Fever yesterday with chills and bodyaches.  Sick contacts possible at resort recently. Was staying at resort recently, dtr who is pregnant - 6 months pregnant, and 2 and 1/61 yo dtr.  No flu shot this year.    Review of Systems  Constitutional: Positive for fever and chills.  Respiratory: Positive for cough. Negative for shortness of breath.   Neurological: Positive for headaches.      Objective:   Physical Exam  Vitals reviewed. Constitutional: She is oriented to person, place, and time. She appears well-developed and well-nourished. No distress.  HENT:  Head: Normocephalic and atraumatic.  Right Ear: Hearing, tympanic membrane, external ear and ear canal normal.  Left Ear: Hearing, tympanic membrane, external ear and ear canal normal.  Nose: Nose normal.  Mouth/Throat: Oropharynx is clear and moist. No oropharyngeal exudate.  Eyes: Conjunctivae normal and EOM are normal. Pupils are equal, round, and reactive to light.  Cardiovascular: Normal rate, regular rhythm, normal heart sounds and intact distal pulses.   No murmur heard. Pulmonary/Chest: Effort normal and breath sounds normal. No respiratory distress. She has no wheezes. She has no rhonchi.  Neurological: She is alert and oriented to person, place, and time.  Skin: Skin is warm and dry. No rash noted.  Psychiatric: She has a normal mood and affect. Her behavior is normal.      Results for orders placed in visit on 04/25/12  POCT INFLUENZA A/B      Component Value Range   Influenza A, POC Positive     Influenza B, POC Negative         Assessment & Plan:  Shannon [name] is a 61 y.o. female 1. Fever, unspecified  POCT Influenza A/B  2. Cough  POCT Influenza A/B, HYDROcodone-homatropine (HYCODAN) 5-1.5 MG/5ML syrup  3. Influenza  oseltamivir  (TAMIFLU) 75 MG capsule   Sx care, tamiflu, mucinex or delsym during day. Hycodan at night if needed. rtc precautions discussed.  Also advised about dtr and granddtr possible prophylaxis to discuss with their care providers.   Patient Instructions  Start mucinex or mucinex dm as needed for cough, cough syrup at night if needed. Start tamiflu tonight. Return to the clinic or go to the nearest emergency room if any of your symptoms worsen or new symptoms occur. Have your daughter discuss with her obgyn and granddaughter's provider whether prophylaxis with a flu medicine is needed.    Influenza, Adult Influenza ("the flu") is a viral infection of the respiratory tract. It occurs more often in winter months because people spend more time in close contact with one another. Influenza can make you feel very sick. Influenza easily spreads from person to person (contagious). CAUSES  Influenza is caused by a virus that infects the respiratory tract. You can catch the virus by breathing in droplets from an infected person's cough or sneeze. You can also catch the virus by touching something that was recently contaminated with the virus and then touching your mouth, nose, or eyes. SYMPTOMS  Symptoms typically last 4 to 10 days and Bailynn include:  Fever.  Chills.  Headache, body aches, and muscle aches.  Sore throat.  Chest discomfort and cough.  Poor appetite.  Weakness or feeling tired.  Dizziness.  Nausea or vomiting. DIAGNOSIS  Diagnosis of influenza is often made based on your history and a physical exam. A nose or throat swab test can be done to confirm the diagnosis. RISKS AND COMPLICATIONS You Yajaira be at risk for a more severe case of influenza if you smoke cigarettes, have diabetes, have chronic heart disease (such as heart failure) or lung disease (such as asthma), or if you have a weakened immune system. Elderly people and pregnant women are also at risk for more serious infections. The  most common complication of influenza is a lung infection (pneumonia). Sometimes, this complication can require emergency medical care and Addalyn be life-threatening. PREVENTION  An annual influenza vaccination (flu shot) is the best way to avoid getting influenza. An annual flu shot is now routinely recommended for all adults in the [place] TREATMENT  In mild cases, influenza goes away on its own. Treatment is directed at relieving symptoms. For more severe cases, your caregiver Maddux prescribe antiviral medicines to shorten the sickness. Antibiotic medicines are not effective, because the infection is caused by a virus, not by bacteria. HOME CARE INSTRUCTIONS  Only take over-the-counter or prescription medicines for pain, discomfort, or fever as directed by your caregiver.  Use a cool mist humidifier to make breathing easier.  Get plenty of rest until your temperature returns to normal. This usually takes 3 to 4 days.  Drink enough fluids to keep your urine clear or pale yellow.  Cover your mouth and nose when coughing or sneezing, and wash your hands well to avoid spreading the virus.  Stay home from work or school until your fever has been gone for at least 1 full day. SEEK MEDICAL CARE IF:   You have chest pain or a deep cough that worsens or produces more mucus.  You have nausea, vomiting, or diarrhea. SEEK IMMEDIATE MEDICAL CARE IF:   You have difficulty breathing, shortness of breath, or your skin or nails turn bluish.  You have severe neck pain or stiffness.  You have a severe headache, facial pain, or earache.  You have a worsening or recurring fever.  You have nausea or vomiting that cannot be controlled. MAKE SURE YOU:  Understand these instructions.  Will watch your condition.  Will get help right away if you are not doing well or get worse. Document Released: 04/12/2000 Document Revised: 10/15/2011 Document Reviewed: 07/15/2011 [facility] Patient Information 2013  [place], [place].

## 2012-04-25 NOTE — Patient Instructions (Addendum)
Start mucinex or mucinex dm as needed for cough, cough syrup at night if needed. Start tamiflu tonight. Return to the clinic or go to the nearest emergency room if any of your symptoms worsen or new symptoms occur. Have your daughter discuss with her obgyn and granddaughter's provider whether prophylaxis with a flu medicine is needed.    Influenza, Adult Influenza ("the flu") is a viral infection of the respiratory tract. It occurs more often in winter months because people spend more time in close contact with one another. Influenza can make you feel very sick. Influenza easily spreads from person to person (contagious). CAUSES  Influenza is caused by a virus that infects the respiratory tract. You can catch the virus by breathing in droplets from an infected person's cough or sneeze. You can also catch the virus by touching something that was recently contaminated with the virus and then touching your mouth, nose, or eyes. SYMPTOMS  Symptoms typically last 4 to 10 days and Kordelia include:  Fever.  Chills.  Headache, body aches, and muscle aches.  Sore throat.  Chest discomfort and cough.  Poor appetite.  Weakness or feeling tired.  Dizziness.  Nausea or vomiting. DIAGNOSIS  Diagnosis of influenza is often made based on your history and a physical exam. A nose or throat swab test can be done to confirm the diagnosis. RISKS AND COMPLICATIONS You Faryn be at risk for a more severe case of influenza if you smoke cigarettes, have diabetes, have chronic heart disease (such as heart failure) or lung disease (such as asthma), or if you have a weakened immune system. Elderly people and pregnant women are also at risk for more serious infections. The most common complication of influenza is a lung infection (pneumonia). Sometimes, this complication can require emergency medical care and Cristyn be life-threatening. PREVENTION  An annual influenza vaccination (flu shot) is the best way to avoid getting  influenza. An annual flu shot is now routinely recommended for all adults in the [place] TREATMENT  In mild cases, influenza goes away on its own. Treatment is directed at relieving symptoms. For more severe cases, your caregiver Jozalynn prescribe antiviral medicines to shorten the sickness. Antibiotic medicines are not effective, because the infection is caused by a virus, not by bacteria. HOME CARE INSTRUCTIONS  Only take over-the-counter or prescription medicines for pain, discomfort, or fever as directed by your caregiver.  Use a cool mist humidifier to make breathing easier.  Get plenty of rest until your temperature returns to normal. This usually takes 3 to 4 days.  Drink enough fluids to keep your urine clear or pale yellow.  Cover your mouth and nose when coughing or sneezing, and wash your hands well to avoid spreading the virus.  Stay home from work or school until your fever has been gone for at least 1 full day. SEEK MEDICAL CARE IF:   You have chest pain or a deep cough that worsens or produces more mucus.  You have nausea, vomiting, or diarrhea. SEEK IMMEDIATE MEDICAL CARE IF:   You have difficulty breathing, shortness of breath, or your skin or nails turn bluish.  You have severe neck pain or stiffness.  You have a severe headache, facial pain, or earache.  You have a worsening or recurring fever.  You have nausea or vomiting that cannot be controlled. MAKE SURE YOU:  Understand these instructions.  Will watch your condition.  Will get help right away if you are not doing well or get worse.  Document Released: 04/12/2000 Document Revised: 10/15/2011 Document Reviewed: 07/15/2011 [facility] Patient Information 2013 [place], [place].

## 2012-04-26 ENCOUNTER — Telehealth: Payer: Self-pay

## 2012-04-26 NOTE — Telephone Encounter (Signed)
Patient advised.

## 2012-04-26 NOTE — Telephone Encounter (Signed)
PT WOULD LIKE TO KNOW WHAT KIND OF FLU SHE HAD AND HOW LONG SHE IS GOING TO BE CONTAIGIOUS [phone]

## 2012-04-26 NOTE — Telephone Encounter (Signed)
1wk  Was positive for Flu A

## 2012-04-26 NOTE — Telephone Encounter (Signed)
LMOM to CB. 

## 2012-06-13 ENCOUNTER — Other Ambulatory Visit: Payer: Self-pay

## 2012-07-23 DIAGNOSIS — H2513 Age-related nuclear cataract, bilateral: Secondary | ICD-10-CM | POA: Insufficient documentation

## 2012-07-23 HISTORY — DX: Age-related nuclear cataract, bilateral: H25.13

## 2012-09-08 ENCOUNTER — Ambulatory Visit (INDEPENDENT_AMBULATORY_CARE_PROVIDER_SITE_OTHER): Payer: BC Managed Care – PPO | Admitting: Sports Medicine

## 2012-09-08 ENCOUNTER — Encounter: Payer: Self-pay | Admitting: Sports Medicine

## 2012-09-08 VITALS — BP 121/84 | HR 52 | Ht 66.0 in | Wt 176.0 lb

## 2012-09-08 DIAGNOSIS — S83207A Unspecified tear of unspecified meniscus, current injury, left knee, initial encounter: Secondary | ICD-10-CM | POA: Insufficient documentation

## 2012-09-08 DIAGNOSIS — M79609 Pain in unspecified limb: Secondary | ICD-10-CM

## 2012-09-08 DIAGNOSIS — M79671 Pain in right foot: Secondary | ICD-10-CM

## 2012-09-08 DIAGNOSIS — IMO0002 Reserved for concepts with insufficient information to code with codable children: Secondary | ICD-10-CM

## 2012-09-08 MED ORDER — MELOXICAM 15 MG PO TABS: ORAL_TABLET | ORAL | Status: DC

## 2012-09-08 NOTE — Progress Notes (Signed)
  Subjective:    Patient ID: Shannon [name], female    DOB: 08/03/1950, 62 y.o.   MRN: [id number]  Knee Pain     1. Left knee swelling. Noted some intermittent swelling on the medial aspect of left knee. Started just after completing a half marathon in December. Overall is slightly improved, but still flares with cycling, squats, or increased physical activity. There is not pain associated in this area. She does note some bilateral knee cap pain when standing on her bike or climbing stairs.  Denies popping, locking or instabilty, redness, fever, decreased ROM.   Review of Systems See HPI otherwise negative.    Objective:   Physical Exam  Musculoskeletal:  Knee ROM intact bilaterally.  Trace edema at medial joint line and at pes anserine area. Moderate left medial joint line tenderness.  Bilateral positive patellar compression test, crepitus noted. No excess joint laxity. No calf tenderness. No warmth, erythema.     MSK [place]: quad and patellar tendon intact. No effusion. Medial/posterior meniscus shows fragmentation and tear with pseudocyst formation. Pes anserine bursa without significant edema.      Assessment & Plan:

## 2012-09-08 NOTE — Patient Instructions (Addendum)
Do home exercises for strengthening to off load the mensicus. 1. Knee flexion limit to 30-45 degree 2. Lateral leg raises 3. Straight leg raises. Can add ankle weights when confident. 4. Quad raises.  Use compression sleeve for knee. Ice would help. Avoid running for one month at least.  Avoid standing on the bike and using high gears.

## 2012-09-08 NOTE — Assessment & Plan Note (Addendum)
Seen on [place] and explains current symptoms. Likely caused by distance running or forceful cycling. Discussed hip/quad strengthening exercises. Recommend compression sleeve. Modification of activities, to avoid running and cycling at high gears at least one month.   Reck in 6 to 8 weeks

## 2013-02-21 ENCOUNTER — Encounter (HOSPITAL_COMMUNITY): Payer: Self-pay | Admitting: Emergency Medicine

## 2013-02-21 ENCOUNTER — Emergency Department (HOSPITAL_COMMUNITY)
Admission: EM | Admit: 2013-02-21 | Discharge: 2013-02-21 | Disposition: A | Discharge status: Home / Self Care | Payer: BC Managed Care – PPO | Attending: Emergency Medicine | Admitting: Emergency Medicine

## 2013-02-21 DIAGNOSIS — Y9289 Other specified places as the place of occurrence of the external cause: Secondary | ICD-10-CM | POA: Insufficient documentation

## 2013-02-21 DIAGNOSIS — F909 Attention-deficit hyperactivity disorder, unspecified type: Secondary | ICD-10-CM | POA: Insufficient documentation

## 2013-02-21 DIAGNOSIS — F3289 Other specified depressive episodes: Secondary | ICD-10-CM | POA: Insufficient documentation

## 2013-02-21 DIAGNOSIS — Y9389 Activity, other specified: Secondary | ICD-10-CM | POA: Insufficient documentation

## 2013-02-21 DIAGNOSIS — F411 Generalized anxiety disorder: Secondary | ICD-10-CM | POA: Insufficient documentation

## 2013-02-21 DIAGNOSIS — Z789 Other specified health status: Secondary | ICD-10-CM | POA: Insufficient documentation

## 2013-02-21 DIAGNOSIS — H43399 Other vitreous opacities, unspecified eye: Secondary | ICD-10-CM | POA: Insufficient documentation

## 2013-02-21 DIAGNOSIS — H43391 Other vitreous opacities, right eye: Secondary | ICD-10-CM

## 2013-02-21 DIAGNOSIS — F329 Major depressive disorder, single episode, unspecified: Secondary | ICD-10-CM | POA: Insufficient documentation

## 2013-02-21 DIAGNOSIS — Z79899 Other long term (current) drug therapy: Secondary | ICD-10-CM | POA: Insufficient documentation

## 2013-02-21 DIAGNOSIS — Z85820 Personal history of malignant melanoma of skin: Secondary | ICD-10-CM | POA: Insufficient documentation

## 2013-02-21 DIAGNOSIS — IMO0002 Reserved for concepts with insufficient information to code with codable children: Secondary | ICD-10-CM | POA: Insufficient documentation

## 2013-02-21 DIAGNOSIS — Z8719 Personal history of other diseases of the digestive system: Secondary | ICD-10-CM | POA: Insufficient documentation

## 2013-02-21 DIAGNOSIS — Z87891 Personal history of nicotine dependence: Secondary | ICD-10-CM | POA: Insufficient documentation

## 2013-02-21 NOTE — ED Notes (Signed)
Pt hit right eye last night aND denies pain at this time but visual issues.

## 2013-02-21 NOTE — ED Provider Notes (Signed)
Medical screening examination/treatment/procedure(s) were performed by non-physician practitioner and as supervising physician I was immediately available for consultation/collaboration.   Kemesha Mosey T Coben Godshall, MD 02/21/13 2319 

## 2013-02-21 NOTE — ED Provider Notes (Signed)
CSN: [id number]     Arrival date & time 02/21/13  1703 History  This chart was scribed for non-physician practitioner Emilia Beck, PA-C working with Toy Baker, MD by Joaquin Music, ED Scribe. This patient was seen in room WTR8/WTR8 and the patient's care was started at 5:30 PM .    Chief Complaint  Patient presents with  . Eye Injury    right   The history is provided by the patient. No language interpreter was used.   HPI Comments: Shannon [name] is a 62 y.o. female who presents to the Emergency Department complaining of R eye injury onset 24 hours. Pt states she hit her eye with her car sun visor 24 hours ago. Pt states she wears eye contacts. She states she has visual disturbance to the R eye that she describes as "black dots and lines". She states the visual disturbances affect her ability to read. She states she put her contacts in today to see if the visual disturbances would change. She states "there was no change". She describes the visual disturbances as "somebody taking a marker and writing on your glasses. That is what I see". Pt is concerned with a torn retina. Pt denies pain in area. She denies ever having visual disturbances. Pt denies any other injuries. Her current Ophthalmologist is Dr. Julian Reil in [place], [place].  Pt states she has a hx of hypothyroid and depression.    Past Medical History  Diagnosis Date  . Anxiety   . Cancer     melanoma- lip 2011  . Depression   . ADD (attention deficit disorder with hyperactivity)   . Diverticulosis    Past Surgical History  Procedure Laterality Date  . Tonsillectomy and adenoidectomy    . Dilation and curettage of uterus     Family History  Problem Relation Age of Onset  . Colon polyps Mother   . Colon cancer Maternal Grandfather    History  Substance Use Topics  . Smoking status: Former Smoker    Quit date: 08/20/1970  . Smokeless tobacco: Never Used  . Alcohol Use: 0.0 oz/week    1-2 Glasses of  wine per week     Comment: rare   OB History   Grav Para Term Preterm Abortions TAB SAB Ect Mult Living                 Review of Systems  Eyes: Positive for visual disturbance. Negative for pain, discharge, redness and itching.  Musculoskeletal: Negative for neck stiffness.    Allergies  Review of patient's allergies indicates no known allergies.  Home Medications   Current Outpatient Rx  Name  Route  Sig  Dispense  Refill  . Azelaic Acid (FINACEA) 15 % cream   Topical   Apply topically 2 (two) times daily. After skin is thoroughly washed and patted dry, gently but thoroughly massage a thin film of azelaic acid cream into the affected area twice daily, in the morning and evening.          . Cholecalciferol (VITAMIN D) 2000 UNITS CAPS   Oral   Take by mouth daily.          . Coenzyme Q10 (CO Q 10) 100 MG CAPS   Oral   Take 1 capsule by mouth daily.         [name] dextroamphetamine (DEXTROSTAT) 10 MG tablet   Oral   Take 10 mg by mouth as needed. ADD          .  escitalopram (LEXAPRO) 5 MG tablet   Oral   Take 5 mg by mouth daily.           [name] levothyroxine (SYNTHROID, LEVOTHROID) 75 MCG tablet   Oral   Take 75 mcg by mouth daily before breakfast.         . Probiotic Product (PROBIOTIC PO)   Oral   Take 1 capsule by mouth daily.            Triage Vitals:BP 111/63  Pulse 56  Temp(Src) 98.1 F (36.7 C)  Resp 16  SpO2 98%  Physical Exam  Nursing note and vitals reviewed. Constitutional: She is oriented to person, place, and time. She appears well-developed and well-nourished. No distress.  HENT:  Head: Normocephalic and atraumatic.  Eyes: Conjunctivae and EOM are normal. Pupils are equal, round, and reactive to light. No scleral icterus.  Fundoscopic exam difficult to assess. No conjunctival injection.   Neck: Normal range of motion.  Cardiovascular: Normal rate and regular rhythm.   Pulmonary/Chest: Effort normal. No respiratory distress.   Musculoskeletal: Normal range of motion.  Neurological: She is alert and oriented to person, place, and time.  Skin: Skin is warm and dry.  Psychiatric: She has a normal mood and affect. Her behavior is normal.    ED Course  Procedures  DIAGNOSTIC STUDIES: Oxygen Saturation is 98% on RA, normal by my interpretation.    COORDINATION OF CARE: 5:36 PM-Discussed treatment plan which includes consult with attending provider for further evaluation. Will order visual acuity for pt. Pt agreed to plan.   Labs Review Labs Reviewed - No data to display Imaging Review No results found.  EKG Interpretation   None       MDM   1. Floaters, right     6:23 PM Visual acuity equal on both sides. No peripheral vision loss. I spoke with Dr. Karleen Hampshire who states she can follow up in the office tomorrow morning. Vitals stable and patient afebrile.   I personally performed the services described in this documentation, which was scribed in my presence. The recorded information has been reviewed and is accurate.     Emilia Beck, PA-C 02/21/13 1825

## 2013-02-25 ENCOUNTER — Encounter: Payer: Self-pay | Admitting: Internal Medicine

## 2013-03-04 ENCOUNTER — Other Ambulatory Visit: Payer: Self-pay

## 2013-03-08 ENCOUNTER — Ambulatory Visit: Payer: BC Managed Care – PPO | Admitting: Internal Medicine

## 2013-03-08 ENCOUNTER — Encounter: Payer: Self-pay | Admitting: Internal Medicine

## 2013-03-08 VITALS — BP 122/80 | HR 56 | Temp 98.1°F | Resp 18 | Ht 66.0 in | Wt 176.6 lb

## 2013-03-08 DIAGNOSIS — Z1212 Encounter for screening for malignant neoplasm of rectum: Secondary | ICD-10-CM

## 2013-03-08 DIAGNOSIS — F329 Major depressive disorder, single episode, unspecified: Secondary | ICD-10-CM | POA: Insufficient documentation

## 2013-03-08 DIAGNOSIS — R7401 Elevation of levels of liver transaminase levels: Secondary | ICD-10-CM

## 2013-03-08 DIAGNOSIS — Z0000 Encounter for general adult medical examination without abnormal findings: Secondary | ICD-10-CM

## 2013-03-08 DIAGNOSIS — Z79899 Other long term (current) drug therapy: Secondary | ICD-10-CM

## 2013-03-08 DIAGNOSIS — Z113 Encounter for screening for infections with a predominantly sexual mode of transmission: Secondary | ICD-10-CM

## 2013-03-08 DIAGNOSIS — E559 Vitamin D deficiency, unspecified: Secondary | ICD-10-CM

## 2013-03-08 DIAGNOSIS — Z23 Encounter for immunization: Secondary | ICD-10-CM

## 2013-03-08 DIAGNOSIS — F4323 Adjustment disorder with mixed anxiety and depressed mood: Secondary | ICD-10-CM

## 2013-03-08 DIAGNOSIS — E039 Hypothyroidism, unspecified: Secondary | ICD-10-CM | POA: Insufficient documentation

## 2013-03-08 DIAGNOSIS — R030 Elevated blood-pressure reading, without diagnosis of hypertension: Secondary | ICD-10-CM

## 2013-03-08 HISTORY — DX: Major depressive disorder, single episode, unspecified: F32.9

## 2013-03-08 MED ORDER — TERBINAFINE HCL 250 MG PO TABS: 250.0000 mg | ORAL_TABLET | Freq: Every day | ORAL | Status: DC

## 2013-03-08 NOTE — Progress Notes (Signed)
Patient ID: Shannon [name], female   DOB: [birth year], 62 y.o.   MRN: [id number]   HPI Patient presents for complete physical.  Patient's primary problem is hypothyroidism due to Hashimoto's. She is followed here and at her summer residence in [place] by a physician there who also supplements her with a compounded T3 pill.  Patient reports seasonal mood changes which in part she relates to the geographical changes from [place].   Other problems include Vitamin D Deficiency for which patient is supplementing Vitamin D.    Current outpatient prescriptions:GAMMA AMINOBUTYRIC ACID PO, Take 200 mg by mouth 2 (two) times daily., Disp: , Rfl: ;  LORazepam (ATIVAN) 2 MG tablet, Take 2 mg by mouth. 1/2-1 tab up to tid prn, Disp: , Rfl: ;  Magnesium Oxide (MAG-200 PO), Take 350 mg by mouth daily., Disp: , Rfl: ;  thyroid (ARMOUR THYROID) 15 MG tablet, Take by mouth. 7.5 mg qd, Disp: , Rfl: ;  Zinc Picolinate 50 MG TABS, Take 50 mg by mouth daily., Disp: , Rfl:  Azelaic Acid (FINACEA) 15 % cream, Apply topically 2 (two) times daily. After skin is thoroughly washed and patted dry, gently but thoroughly massage a thin film of azelaic acid cream into the affected area twice daily, in the morning and evening. , Disp: , Rfl: ;  Cholecalciferol (VITAMIN D) 2000 UNITS CAPS, Take by mouth daily. , Disp: , Rfl: ;  Coenzyme Q10 (CO Q 10) 100 MG CAPS, Take 1 capsule by mouth daily., Disp: , Rfl:  dextroamphetamine (DEXTROSTAT) 10 MG tablet, Take 10 mg by mouth as needed. ADD , Disp: , Rfl: ;  escitalopram (LEXAPRO) 5 MG tablet, Take 5 mg by mouth daily.  , Disp: , Rfl: ;  levothyroxine (SYNTHROID, LEVOTHROID) 75 MCG tablet, Take 50 mcg by mouth daily before breakfast. 100 mcg 4 days a week and 3 days a week, Disp: , Rfl: ;  Probiotic Product (PROBIOTIC PO), Take 1 capsule by mouth daily.  , Disp: , Rfl:  terbinafine (LAMISIL) 250 MG tablet, Take 1 tablet (250 mg total) by mouth daily. For 3 months for toenail fungus - need lab  test in 6 weeks to monitor liver, Disp: 90 tablet, Rfl: 0.    No Known Allergies  Past Medical History  Diagnosis Date  . Anxiety   . Cancer     melanoma- lip 2011  . Depression   . ADD (attention deficit disorder with hyperactivity)   . Diverticulosis   . Asthma   . Hypothyroidism   . Osteopenia   . Rosacea   . Menopause     Past Surgical History  Procedure Laterality Date  . Tonsillectomy and adenoidectomy    . Dilation and curettage of uterus      Family History  Problem Relation Age of Onset  . Colon polyps Mother   . Parkinson's disease Mother   . Colon cancer Maternal Grandfather   . Diabetes Father   . Emphysema Father   . Tuberculosis Father     History   Social History  . Marital Status: Single    Spouse Name: N/A    Number of Children: 2  . Years of Education: N/A   Occupational History  . Retired Programmer, systems    Social History Main Topics  . Smoking status: Never Smoker   . Smokeless tobacco: Never Used  . Alcohol Use: 0.0 oz/week    1-2 Glasses of wine per week     Comment: rare  .  Drug Use: No  . Sexual Activity: Not on file   Other Topics Concern  . Not on file   Social History Narrative  . No narrative on file    SYSTEMS REVIEW Constitutional: Denies fever, chills, weight loss/gain, headaches, insomnia, fatigue, night sweats, and change in appetite. Eyes: Denies redness, blurred vision, diplopia, discharge, itchy, watery eyes.  ENT: Denies discharge, congestion, post nasal drip, epistaxis, sore throat, earache, hearing loss, dental pain, Tinnitus, Vertigo, Sinus pain, snoring.  Cardio: Denies chest pain, palpitations, irregular heartbeat, syncope, dyspnea, diaphoresis, orthopnea, PND, claudication, edema Respiratory: denies cough, dyspnea, DOE, pleurisy, hoarseness, laryngitis, wheezing.  Gastrointestinal: Denies dysphagia, heartburn, reflux, water brash, pain, cramps, nausea, vomiting, bloating, diarrhea, constipation, hematemesis,  melena, hematochezia, jaundice, hemorrhoids Genitourinary: Denies dysuria, frequency, urgency, nocturia, hesitancy, discharge, hematuria, flank pain Musculoskeletal: Denies arthralgia, myalgia, stiffness, Jt. Swelling, pain, limp, and strain/sprain. Skin: Denies puritis, rash, hives, warts, acne, eczema, changing in skin lesion Neuro: Weakness, tremor, incoordination, spasms, paresthesia, pain Psychiatric: Denies confusion, memory loss, sensory loss Endocrine: Denies change in weight, skin, hair change, nocturia, and paresthesia, Diabetic Polys, visual blurring, hyper /hypo glycemic episodes.  Heme/Lymph: Excessive bleeding, bruising, enlarged lymph node   Physical Exam: Filed Vitals:   03/08/13 0953  BP: 122/80  Pulse: 56  Temp: 98.1 F (36.7 C)  Resp: 18    General Appearance: Well nourished, in no apparent distress. Eyes: PERRLA, EOMs, conjunctiva no swelling or erythema, normal fundi and vessels. Sinuses: No Frontal/maxillary tenderness ENT/Mouth: EACs Patent / TMs  nl. Nares clear without erythema, swelling, mucoid exudates. Good dentition. No erythema, swelling, or exudate on post pharynx. Tongue normal, non-obstructing. Tonsils not swollen or erythematous. Hearing normal.  Neck: Supple, thyroid normal. No bruits or JVD. Respiratory: Respiratory effort normal.  BS equal bilaterally without rales, rhonci, wheezing or stridor. Cardio: Heart sounds normal, regular rate and rhythm without murmurs, rubs or gallops. Peripheral pulses brisk and equal bilaterally, without edema. No aortic or femoral bruits. Chest: symmetric, with normal excursions and percussion. Breasts: Normal w/ abn. Masses or tenderness Abdomen: Flat, soft, with bowl sounds. Nontender, no guarding, rebound, hernias, masses, or organomegaly.  Lymphatics: Non tender without lymphadenopathy.  Musculoskeletal: Full ROM all peripheral extremities, joint stability, 5/5 strength, and normal gait. Skin: Warm, dry without  rashes, lesions, ecchymosis.  Neuro: Cranial nerves intact, reflexes equal bilaterally. Normal muscle tone, no cerebellar symptoms. Sensation intact.  Pysch: Awake and oriented X 3, normal affect, Insight and Judgment appropriate.   Assessment and Plan  1.Screening Annual Exam  2. Hypothyroidism  3. Anxiety/Depression  4. Vitamin D Deficiency - Supplement as discussed   Plan routine screening labs, EKG, hemoccult, Aortoscan

## 2013-03-08 NOTE — Patient Instructions (Signed)
Continue diet and meds as discussed. Further disposition pending results of labs.  Hypothyroidism The thyroid is a large gland located in the lower front of your neck. The thyroid gland helps control metabolism. Metabolism is how your body handles food. It controls metabolism with the hormone thyroxine. When this gland is underactive (hypothyroid), it produces too little hormone.  CAUSES These include:   Absence or destruction of thyroid tissue.  Goiter due to iodine deficiency.  Goiter due to medications.  Congenital defects (since birth).  Problems with the pituitary. This causes a lack of TSH (thyroid stimulating hormone). This hormone tells the thyroid to turn out more hormone. SYMPTOMS  Lethargy (feeling as though you have no energy)  Cold intolerance  Weight gain (in spite of normal food intake)  Dry skin  Coarse hair  Menstrual irregularity (if severe, Yanel lead to infertility)  Slowing of thought processes Cardiac problems are also caused by insufficient amounts of thyroid hormone. Hypothyroidism in the newborn is cretinism, and is an extreme form. It is important that this form be treated adequately and immediately or it will lead rapidly to retarded physical and mental development. DIAGNOSIS  To prove hypothyroidism, your caregiver Hiba do blood tests and ultrasound tests. Sometimes the signs are hidden. It Rotha be necessary for your caregiver to watch this illness with blood tests either before or after diagnosis and treatment. TREATMENT  Low levels of thyroid hormone are increased by using synthetic thyroid hormone. This is a safe, effective treatment. It usually takes about four weeks to gain the full effects of the medication. After you have the full effect of the medication, it will generally take another four weeks for problems to leave. Your caregiver Aida start you on low doses. If you have had heart problems the dose Leidy be gradually increased. It is generally not an  emergency to get rapidly to normal. HOME CARE INSTRUCTIONS   Take your medications as your caregiver suggests. Let your caregiver know of any medications you are taking or start taking. Your caregiver will help you with dosage schedules.  As your condition improves, your dosage needs Vicy increase. It will be necessary to have continuing blood tests as suggested by your caregiver.  Report all suspected medication side effects to your caregiver. SEEK MEDICAL CARE IF: Seek medical care if you develop:  Sweating.  Tremulousness (tremors).  Anxiety.  Rapid weight loss.  Heat intolerance.  Emotional swings.  Diarrhea.  Weakness. SEEK IMMEDIATE MEDICAL CARE IF:  You develop chest pain, an irregular heart beat (palpitations), or a rapid heart beat. MAKE SURE YOU:   Understand these instructions.  Will watch your condition.  Will get help right away if you are not doing well or get worse. Document Released: 04/15/2005 Document Revised: 07/08/2011 Document Reviewed: 12/04/2007 [facility] Patient Information 2014 [place], [place].

## 2013-03-09 LAB — BASIC METABOLIC PANEL WITH GFR
BUN: 20 mg/dL (ref 6–23)
Calcium: 9.2 mg/dL (ref 8.4–10.5)
GFR, Est African American: 77 mL/min
Glucose, Bld: 89 mg/dL (ref 70–99)
Potassium: 4.8 mEq/L (ref 3.5–5.3)

## 2013-03-09 LAB — URINALYSIS, COMPLETE
Bacteria, UA: NONE SEEN
Bilirubin Urine: NEGATIVE
Crystals: NONE SEEN
Glucose, UA: NEGATIVE mg/dL
Hgb urine dipstick: NEGATIVE
Ketones, ur: 15 mg/dL — AB
Protein, ur: NEGATIVE mg/dL
Specific Gravity, Urine: 1.023 (ref 1.005–1.030)
Urobilinogen, UA: 0.2 mg/dL (ref 0.0–1.0)
pH: 6.5 (ref 5.0–8.0)

## 2013-03-09 LAB — CBC WITH DIFFERENTIAL/PLATELET
Basophils Relative: 1 % (ref 0–1)
Eosinophils Relative: 3 % (ref 0–5)
HCT: 41.2 % (ref 36.0–46.0)
Hemoglobin: 14 g/dL (ref 12.0–15.0)
Lymphocytes Relative: 46 % (ref 12–46)
Lymphs Abs: 1.7 10*3/uL (ref 0.7–4.0)
MCH: 31.9 pg (ref 26.0–34.0)
MCHC: 34 g/dL (ref 30.0–36.0)
Monocytes Absolute: 0.2 10*3/uL (ref 0.1–1.0)
Monocytes Relative: 6 % (ref 3–12)
Neutro Abs: 1.7 10*3/uL (ref 1.7–7.7)
RBC: 4.39 MIL/uL (ref 3.87–5.11)

## 2013-03-09 LAB — HEPATITIS B SURFACE ANTIBODY,QUALITATIVE: Hep B S Ab: POSITIVE — AB

## 2013-03-09 LAB — LIPID PANEL
HDL: 60 mg/dL (ref >39)
LDL Cholesterol: 109 mg/dL — ABNORMAL HIGH (ref 0–99)
Total CHOL/HDL Ratio: 3 Ratio

## 2013-03-09 LAB — HEPATIC FUNCTION PANEL
Albumin: 4.4 g/dL (ref 3.5–5.2)
Alkaline Phosphatase: 61 U/L (ref 39–117)
Indirect Bilirubin: 1 mg/dL — ABNORMAL HIGH (ref 0.0–0.9)
Total Bilirubin: 1.2 mg/dL (ref 0.3–1.2)

## 2013-03-09 LAB — IRON AND TIBC
%SAT: 45 % (ref 20–55)
Iron: 146 ug/dL — ABNORMAL HIGH (ref 42–145)
TIBC: 322 ug/dL (ref 250–470)
UIBC: 176 ug/dL (ref 125–400)

## 2013-03-09 LAB — VITAMIN B12: Vitamin B-12: 524 pg/mL (ref 211–911)

## 2013-03-09 LAB — MICROALBUMIN / CREATININE URINE RATIO
Creatinine, Urine: 208.1 mg/dL
Microalb Creat Ratio: 2.4 mg/g (ref 0.0–30.0)

## 2013-03-09 LAB — MAGNESIUM: Magnesium: 2.1 mg/dL (ref 1.5–2.5)

## 2013-03-09 LAB — TSH: TSH: 0.937 u[IU]/mL (ref 0.350–4.500)

## 2013-03-09 LAB — HEPATITIS A ANTIBODY, TOTAL: Hep A Total Ab: REACTIVE — AB

## 2013-03-09 LAB — RPR

## 2013-03-10 LAB — ZINC: Zinc: 186 ug/dL — ABNORMAL HIGH (ref 60–130)

## 2013-03-11 LAB — HEPATITIS B E ANTIBODY: Hepatitis Be Antibody: NEGATIVE

## 2013-03-31 ENCOUNTER — Other Ambulatory Visit: Payer: Self-pay | Admitting: Physician Assistant

## 2013-03-31 MED ORDER — ESCITALOPRAM OXALATE 10 MG PO TABS: 10.0000 mg | ORAL_TABLET | Freq: Every day | ORAL | Status: DC

## 2013-04-20 ENCOUNTER — Encounter: Payer: Self-pay | Admitting: Sports Medicine

## 2013-04-20 ENCOUNTER — Ambulatory Visit (INDEPENDENT_AMBULATORY_CARE_PROVIDER_SITE_OTHER): Payer: BC Managed Care – PPO | Admitting: Sports Medicine

## 2013-04-20 VITALS — Ht 66.0 in | Wt 176.0 lb

## 2013-04-20 DIAGNOSIS — M25569 Pain in unspecified knee: Secondary | ICD-10-CM

## 2013-04-20 DIAGNOSIS — M25561 Pain in right knee: Secondary | ICD-10-CM

## 2013-04-20 NOTE — Assessment & Plan Note (Signed)
Some knee pain bilaterally now  Left meniscus injury seems to have resolved with little pain  Swelling is actually thickening of bursal sac of pes anserinne  Probably has some deg meniscal issues bilat but not clinically limiting  OK to keep up exercise but limit her degree of knee flexion  Some new padding to forefoot of orthotics/  We will make new pair soon

## 2013-04-20 NOTE — Progress Notes (Signed)
Sports Medicine Office Visit Note   Subjective:   Patient ID: Shannon [name], female  DOB: 05-21-1950, 62 y.o.. MRN: [id number]   Pt that comes today to f/u her knees issues and to receive advice about what activities and level of intensity she can do to avoid further injury.   Left knee: no pain but swelling that has never went away, moderately worsens with after intense training.   Right knee:  Mild intermittent pain on medial aspect. Certain positions triggers it. No noticeable swelling or redness.  PMHx significant for evaluation here in [name]/2013 with tear of meniscus of left knee.  Review of Systems:  Denies fever, chills, nausea, vomiting or other systemic symptoms.   Objective:   Physical Exam: Gen:  NAD MSK: Knees: moderated swelling in medial aspect below left knee.  This is over pes anserine bursa.. No erythema or calor. No knee deformity. ROM intact. Flexion up to 160 degrees. Mild crepitus with flexion extension. No joint laxity. Mcmurray is negative. Neurovascular intact.  Right Knee: no deformity, no erythema or edema. Intact ROM.  Also mild crepitus with flexion and extension. Mcmurray is positive but does not limit pt knee function. Neurovascular intact.  Flexion 150 deg She can do squat to 45 deg with no severe pain even while standing on 1 leg bilaterally  No leg discrepancy.  Feet: High longitudinal arches. Decreased transversal arches bilaterally.  Hips: normal ROM, no weakness. Normal SI joint.    Assessment & Plan:

## 2013-04-20 NOTE — Patient Instructions (Signed)
It has been a pleasure to see you today. Please don't do squats more than 45 degree angle.  You can run but with moderation.

## 2013-04-20 NOTE — Assessment & Plan Note (Addendum)
Discussion about extreme flexion of knees and duration as well as length of running. No other limitation of activity.  Orthotics fixed in office today.

## 2013-05-03 ENCOUNTER — Other Ambulatory Visit: Payer: Self-pay | Admitting: Internal Medicine

## 2013-05-03 DIAGNOSIS — B351 Tinea unguium: Secondary | ICD-10-CM

## 2013-05-03 DIAGNOSIS — Z79899 Other long term (current) drug therapy: Secondary | ICD-10-CM

## 2013-05-04 ENCOUNTER — Other Ambulatory Visit: Payer: BC Managed Care – PPO

## 2013-05-04 ENCOUNTER — Ambulatory Visit: Payer: BC Managed Care – PPO | Admitting: Sports Medicine

## 2013-05-04 DIAGNOSIS — Z79899 Other long term (current) drug therapy: Secondary | ICD-10-CM

## 2013-05-04 DIAGNOSIS — B351 Tinea unguium: Secondary | ICD-10-CM

## 2013-05-04 LAB — HEPATIC FUNCTION PANEL
ALBUMIN: 4.6 g/dL (ref 3.5–5.2)
ALT: 18 U/L (ref 0–35)
AST: 25 U/L (ref 0–37)
Alkaline Phosphatase: 68 U/L (ref 39–117)
BILIRUBIN TOTAL: 0.9 mg/dL (ref 0.3–1.2)
Bilirubin, Direct: 0.1 mg/dL (ref 0.0–0.3)
Indirect Bilirubin: 0.8 mg/dL (ref 0.0–0.9)
Total Protein: 6.7 g/dL (ref 6.0–8.3)

## 2013-05-05 ENCOUNTER — Other Ambulatory Visit: Payer: Self-pay | Admitting: Emergency Medicine

## 2013-05-05 MED ORDER — LORAZEPAM 2 MG PO TABS: 2.0000 mg | ORAL_TABLET | Freq: Three times a day (TID) | ORAL | Status: DC | PRN

## 2013-05-07 ENCOUNTER — Other Ambulatory Visit: Payer: Self-pay | Admitting: Physician Assistant

## 2013-05-09 ENCOUNTER — Other Ambulatory Visit: Payer: Self-pay | Admitting: Emergency Medicine

## 2013-05-25 ENCOUNTER — Telehealth: Payer: Self-pay | Admitting: *Deleted

## 2013-05-25 NOTE — Telephone Encounter (Signed)
Patient called and asked if we had received a lab report that was ordered by her endocrinologist in [place].  After looking in EPIC , no result seen.  Patient aware.

## 2013-06-25 ENCOUNTER — Other Ambulatory Visit: Payer: Self-pay | Admitting: Sports Medicine

## 2013-09-29 ENCOUNTER — Telehealth: Payer: Self-pay | Admitting: *Deleted

## 2013-09-29 ENCOUNTER — Other Ambulatory Visit: Payer: Self-pay | Admitting: Emergency Medicine

## 2013-09-29 MED ORDER — ESCITALOPRAM OXALATE 10 MG PO TABS: 5.0000 mg | ORAL_TABLET | Freq: Every day | ORAL | Status: DC

## 2013-09-29 NOTE — Telephone Encounter (Signed)
REFILL= LEXAPRO 10MG  #90   [facility] [place]   [phone]

## 2014-02-15 ENCOUNTER — Encounter: Payer: Self-pay | Admitting: Sports Medicine

## 2014-02-15 ENCOUNTER — Ambulatory Visit (INDEPENDENT_AMBULATORY_CARE_PROVIDER_SITE_OTHER): Payer: BC Managed Care – PPO | Admitting: Sports Medicine

## 2014-02-15 VITALS — BP 120/76 | HR 67 | Ht 66.0 in | Wt 174.0 lb

## 2014-02-15 DIAGNOSIS — R269 Unspecified abnormalities of gait and mobility: Secondary | ICD-10-CM

## 2014-02-15 NOTE — Progress Notes (Signed)
  Shannon [name] Shannon [name] - 63 y.o. female MRN [id number]  Date of birth: 1950/10/25  SUBJECTIVE:  Including CC & ROS.  Patient presents today because she was awarded a pair of free orthotics Patient's last orthotic from it one year ago and have been doing well Her only complaint having some dorsal fluid swelling after a long vacation where she did a lot of biking several weeks ago. The swelling has resolved and she is no longer having any pain   ROS: Review of systems otherwise negative except for information present in HPI  HISTORY: Past Medical, Surgical, Social, and Family History Reviewed & Updated per EMR. Pertinent Historical Findings include: History of Achilles tendinitis, plantar fasciitis, metatarsalgia  PHYSICAL EXAM:  VS: BP:120/76 mmHg  HR:67bpm  TEMP: ( )  RESP:   HT:5\' 6"  (167.6 cm)   WT:174 lb (78.926 kg)  BMI:28.1 FOOT EXAM:  General: well nourished Skin of LE: warm; dry, no rashes, lesions, ecchymosis or erythema. Vascular: Dorsal pedal pulses 2+ bilaterally Neurologically: Sensation to light touch lower extremities equal and intact bilaterally.  Observation - no ecchymosis, no edema, or hematoma present  Palpation: no pain Normal ankle motion and strength bilaterally  Extension/flexion 5/5 strength bilaterally in toes Weight-bearing foot exam:  First ray: nuetral  Second ray: nuetral Longitudinal arch: high Heel position: mild valgus Gait analysis:  Striking location: midfoot Foot motion: slight supination  Knee motion: neutral  Hip motion: neutral   ASSESSMENT & PLAN: See problem based charting & AVS for pt instructions. Orthotic Fitting and Adjustment note: Patient was fitted for a : standard, cushioned, semi-rigid orthotic.  The orthotic was heated and afterward the patient stood on the orthotic blank positioned on the orthotic stand.  The patient was positioned in subtalar neutral position and 10 degrees of ankle dorsiflexion in a weight bearing stance.  After  completion of molding, a stable base was applied to the orthotic blank.  The blank was ground to a stable position for weight bearing.  Size: 9 Base: Blue EVA Posting: none Additional orthotic padding: bilateral small metatarsal pad  No change to visit

## 2014-02-28 ENCOUNTER — Ambulatory Visit (INDEPENDENT_AMBULATORY_CARE_PROVIDER_SITE_OTHER): Payer: BC Managed Care – PPO | Admitting: Internal Medicine

## 2014-02-28 ENCOUNTER — Encounter: Payer: Self-pay | Admitting: Internal Medicine

## 2014-02-28 VITALS — BP 100/60 | HR 64 | Resp 16 | Ht 65.75 in | Wt 177.2 lb

## 2014-02-28 DIAGNOSIS — Z111 Encounter for screening for respiratory tuberculosis: Secondary | ICD-10-CM

## 2014-02-28 DIAGNOSIS — R6889 Other general symptoms and signs: Secondary | ICD-10-CM

## 2014-02-28 DIAGNOSIS — Z23 Encounter for immunization: Secondary | ICD-10-CM

## 2014-02-28 DIAGNOSIS — Z1212 Encounter for screening for malignant neoplasm of rectum: Secondary | ICD-10-CM

## 2014-02-28 DIAGNOSIS — Z8249 Family history of ischemic heart disease and other diseases of the circulatory system: Secondary | ICD-10-CM

## 2014-02-28 DIAGNOSIS — E559 Vitamin D deficiency, unspecified: Secondary | ICD-10-CM

## 2014-02-28 DIAGNOSIS — Z0001 Encounter for general adult medical examination with abnormal findings: Secondary | ICD-10-CM

## 2014-02-28 DIAGNOSIS — E039 Hypothyroidism, unspecified: Secondary | ICD-10-CM

## 2014-02-28 DIAGNOSIS — Z79899 Other long term (current) drug therapy: Secondary | ICD-10-CM

## 2014-02-28 HISTORY — DX: Vitamin D deficiency, unspecified: E55.9

## 2014-02-28 NOTE — Progress Notes (Signed)
Patient ID: Shannon [name], female   DOB: 09-May-1950, 63 y.o.   MRN: [id number]  Annual Screening Comprehensive Examination  This very nice 63 y.o.female presents for complete physical.  Patient has been followed for Hypothyroidism, mild Hyperlipidemia and Vitamin D Deficiency.   In 2005 , patient was dx'd with Hypothyroidism due to Hashimoto's Thyroiditis and was started on replacement therapy.   Patient's BP has been controlled at home and patient denies any cardiac symptoms as chest pain, palpitations, shortness of breath, dizziness or ankle swelling. Today's BP: 100/60 mmHg    Patient's hyperlipidemia is controlled with diet. Patient denies myalgias or other medication SE's. Last lipids were Total Chol 181; HDL 60; LDL  109*; Trig 58 on 03/08/2013:   Patient is screened for  prediabetes and patient denies reactive hypoglycemic symptoms, visual blurring, diabetic polys, or paresthesias. Last A1c was 5.4% in Dec 2013.   Finally, patient has history of Vitamin D Deficiency and last Vitamin D was  75 on 03/08/2013.  Medication Sig  . Cholecalciferol (VITAMIN D) 2000 UNITS CAPS Take by mouth 2 (two) times daily.   . Coenzyme Q10 (CO Q 10) 100 MG CAPS Take 1 capsule by mouth daily.  [name] escitalopram (LEXAPRO) 10 MG tablet Take 0.5 tablets (5 mg total) by mouth daily.  [name] GAMMA AMINOBUTYRIC ACID PO Take 200 mg by mouth 2 (two) times daily.  [name] LORazepam (ATIVAN) 2 MG tablet Take 1 tablet (2 mg total) by mouth every 8 (eight) hours as needed for anxiety. 1/2-1 tab up to tid prn  . Probiotic Product (PROBIOTIC PO) Take 1 capsule by mouth daily.    [name] thyroid (ARMOUR THYROID) 15 MG tablet Take by mouth. 7.5 mg qd   No Known Allergies  Past Medical History  Diagnosis Date  . Anxiety   . Cancer     melanoma- lip 2011  . Depression   . ADD (attention deficit disorder with hyperactivity)   . Diverticulosis   . Asthma   . Hypothyroidism   . Osteopenia   . Rosacea   . Menopause    Health  Maintenance  Topic Date Due  . PAP SMEAR  09/27/2012  . INFLUENZA VACCINE  11/27/2013  . MAMMOGRAM  05/20/2015  . COLONOSCOPY  09/06/2015  . TETANUS/TDAP  09/06/2019  . ZOSTAVAX  Completed   Immunization History  Administered Date(s) Administered  . Influenza Split 03/08/2013  . PPD Test 03/08/2013, 02/28/2014  . Pneumococcal-Unspecified 04/08/2011  . Tdap 09/05/2009  . Zoster 02/28/2014   Past Surgical History  Procedure Laterality Date  . Tonsillectomy and adenoidectomy    . Dilation and curettage of uterus     Family History  Problem Relation Age of Onset  . Colon polyps Mother   . Parkinson's disease Mother   . Colon cancer Maternal Grandfather   . Diabetes Father   . Emphysema Father    ASHD/MI Father died 68 yo with a "massive heart attack"   . Tuberculosis Father    History  Substance Use Topics  . Smoking status: Never Smoker   . Smokeless tobacco: Never Used  . Alcohol Use: 0.0 oz/week    1-2 Glasses of wine per week     Comment: rare    ROS Constitutional: Denies fever, chills, weight loss/gain, headaches, insomnia, fatigue, night sweats, and change in appetite. Eyes: Denies redness, blurred vision, diplopia, discharge, itchy, watery eyes.  ENT: Denies discharge, congestion, post nasal drip, epistaxis, sore throat, earache, hearing loss, dental pain, Tinnitus, Vertigo, Sinus  pain, snoring.  Cardio: Denies chest pain, palpitations, irregular heartbeat, syncope, dyspnea, diaphoresis, orthopnea, PND, claudication, edema Respiratory: denies cough, dyspnea, DOE, pleurisy, hoarseness, laryngitis, wheezing.  Gastrointestinal: Denies dysphagia, heartburn, reflux, water brash, pain, cramps, nausea, vomiting, bloating, diarrhea, constipation, hematemesis, melena, hematochezia, jaundice, hemorrhoids Genitourinary: Denies dysuria, frequency, urgency, nocturia, hesitancy, discharge, hematuria, flank pain Breast: Breast lumps, nipple discharge, bleeding.  Musculoskeletal:  Denies arthralgia, myalgia, stiffness, Jt. Swelling, pain, limp, and strain/sprain. Denies falls. Skin: Denies puritis, rash, hives, warts, acne, eczema, changing in skin lesion Neuro: No weakness, tremor, incoordination, spasms, paresthesia, pain Psychiatric: Denies confusion, memory loss, sensory loss. Denies Depression. Endocrine: Denies change in weight, skin, hair change, nocturia, and paresthesia, diabetic polys, visual blurring, hyper / hypo glycemic episodes.  Heme/Lymph: No excessive bleeding, bruising, enlarged lymph nodes.  Physical Exam  BP 100/60 mmHg  Pulse 64  Resp 16  Ht 5' 5.75"   Wt 177 lb 3.2 oz   BMI 28.82  LMP 2005  General Appearance: Well nourished and in no apparent distress. Eyes: PERRLA, EOMs, conjunctiva no swelling or erythema, normal fundi and vessels. Sinuses: No frontal/maxillary tenderness ENT/Mouth: EACs patent / TMs  nl. Nares clear without erythema, swelling, mucoid exudates. Oral hygiene is good. No erythema, swelling, or exudate. Tongue normal, non-obstructing. Tonsils not swollen or erythematous. Hearing normal.  Neck: Supple, thyroid normal. No bruits, nodes or JVD. Respiratory: Respiratory effort normal.  BS equal and clear bilateral without rales, rhonci, wheezing or stridor. Cardio: Heart sounds are normal with regular rate and rhythm and no murmurs, rubs or gallops. Peripheral pulses are normal and equal bilaterally without edema. No aortic or femoral bruits. Chest: symmetric with normal excursions and percussion. Breasts: Symmetric, without lumps, nipple discharge, retractions, or fibrocystic changes.  Abdomen: Flat, soft, with bowl sounds. Nontender, no guarding, rebound, hernias, masses, or organomegaly.  Lymphatics: Non tender without lymphadenopathy.  Musculoskeletal: Full ROM all peripheral extremities, joint stability, 5/5 strength, and normal gait. Skin: Warm and dry without rashes, lesions, cyanosis, clubbing or  ecchymosis.  Neuro:  Cranial nerves intact, reflexes equal bilaterally. Normal muscle tone, no cerebellar symptoms. Sensation intact.  Pysch: Awake and oriented X 3, normal affect, Insight and Judgment appropriate.  Assessment and Plan  1. Annual Screening Examination 2. Hypothyroidism 3. Vitamin D Deficiency   Continue prudent diet as discussed, weight control, BP monitoring, regular exercise, and medications. Discussed med's effects and SE's. Screening labs and tests as requested with regular follow-up as recommended.

## 2014-02-28 NOTE — Patient Instructions (Signed)
Recommend the book "The END of DIETING" by Dr Baker Janus   and the book "The END of DIABETES " by Dr Excell Seltzer  At [email or web address] - get book & Audio CD's      Being diabetic has a  300% increased risk for heart attack, stroke, cancer, and alzheimer- type vascular dementia. It is very important that you work harder with diet by avoiding all foods that are white except chicken & fish. Avoid white rice (brown & wild rice is OK), white potatoes (sweetpotatoes in moderation is OK), White bread or wheat bread or anything made out of white flour like bagels, donuts, rolls, buns, biscuits, cakes, pastries, cookies, pizza crust, and pasta (made from white flour & egg whites) - vegetarian pasta or spinach or wheat pasta is OK. Multigrain breads like Arnold's or [employer], or multigrain sandwich thins or flatbreads.  Diet, exercise and weight loss can reverse and cure diabetes in the early stages.  Diet, exercise and weight loss is very important in the control and prevention of complications of diabetes which affects every system in your body, ie. Brain - dementia/stroke, eyes - glaucoma/blindness, heart - heart attack/heart failure, kidneys - dialysis, stomach - gastric paralysis, intestines - malabsorption, nerves - severe painful neuritis, circulation - gangrene & loss of a leg(s), and finally cancer and Alzheimers.    I recommend avoid fried & greasy foods,  sweets/candy, white rice (brown or wild rice or Quinoa is OK), white potatoes (sweet potatoes are OK) - anything made from white flour - bagels, doughnuts, rolls, buns, biscuits,white and wheat breads, pizza crust and traditional pasta made of white flour & egg white(vegetarian pasta or spinach or wheat pasta is OK).  Multi-grain bread is OK - like multi-grain flat bread or sandwich thins. Avoid alcohol in excess. Exercise is also important.    Eat all the vegetables you want - avoid meat, especially red meat and dairy - especially cheese.  Cheese  is the most concentrated form of trans-fats which is the worst thing to clog up our arteries. Veggie cheese is OK which can be found in the fresh produce section at [employer] or [employer] or [employer]   Preventive Care for Adults A healthy lifestyle and preventive care can promote health and wellness. Preventive health guidelines for women include the following key practices.  A routine yearly physical is a good way to check with your health care provider about your health and preventive screening. It is a chance to share any concerns and updates on your health and to receive a thorough exam.  Visit your dentist for a routine exam and preventive care every 6 months. Brush your teeth twice a day and floss once a day. Good oral hygiene prevents tooth decay and gum disease.  The frequency of eye exams is based on your age, health, family medical history, use of contact lenses, and other factors. Follow your health care provider's recommendations for frequency of eye exams.  Eat a healthy diet. Foods like vegetables, fruits, whole grains, low-fat dairy products, and lean protein foods contain the nutrients you need without too many calories. Decrease your intake of foods high in solid fats, added sugars, and salt. Eat the right amount of calories for you.Get information about a proper diet from your health care provider, if necessary.  Regular physical exercise is one of the most important things you can do for your health. Most adults should get at least 150 minutes of moderate-intensity exercise (any activity that  increases your heart rate and causes you to sweat) each week. In addition, most adults need muscle-strengthening exercises on 2 or more days a week.  Maintain a healthy weight. The body mass index (BMI) is a screening tool to identify possible weight problems. It provides an estimate of body fat based on height and weight. Your health care provider can find your BMI and can help you  achieve or maintain a healthy weight.For adults 20 years and older:  A BMI below 18.5 is considered underweight.  A BMI of 18.5 to 24.9 is normal.  A BMI of 25 to 29.9 is considered overweight.  A BMI of 30 and above is considered obese.  Maintain normal blood lipids and cholesterol levels by exercising and minimizing your intake of saturated fat. Eat a balanced diet with plenty of fruit and vegetables. Blood tests for lipids and cholesterol should begin at age 92 and be repeated every 5 years. If your lipid or cholesterol levels are high, you are over 50, or you are at high risk for heart disease, you Clayton need your cholesterol levels checked more frequently.Ongoing high lipid and cholesterol levels should be treated with medicines if diet and exercise are not working.  If you smoke, find out from your health care provider how to quit. If you do not use tobacco, do not start.  Lung cancer screening is recommended for adults aged 47-80 years who are at high risk for developing lung cancer because of a history of smoking. A yearly low-dose CT scan of the lungs is recommended for people who have at least a 30-pack-year history of smoking and are a current smoker or have quit within the past 15 years. A pack year of smoking is smoking an average of 1 pack of cigarettes a day for 1 year (for example: 1 pack a day for 30 years or 2 packs a day for 15 years). Yearly screening should continue until the smoker has stopped smoking for at least 15 years. Yearly screening should be stopped for people who develop a health problem that would prevent them from having lung cancer treatment.  High blood pressure causes heart disease and increases the risk of stroke. Your blood pressure should be checked at least every 1 to 2 years. Ongoing high blood pressure should be treated with medicines if weight loss and exercise do not work.  If you are 49-49 years old, ask your health care provider if you should take  aspirin to prevent strokes.  Diabetes screening involves taking a blood sample to check your fasting blood sugar level. This should be done once every 3 years, after age 54, if you are within normal weight and without risk factors for diabetes. Testing should be considered at a younger age or be carried out more frequently if you are overweight and have at least 1 risk factor for diabetes.  Breast cancer screening is essential preventive care for women. You should practice "breast self-awareness." This means understanding the normal appearance and feel of your breasts and [place] include breast self-examination. Any changes detected, no matter how small, should be reported to a health care provider. Women in their 23s and 30s should have a clinical breast exam (CBE) by a health care provider as part of a regular health exam every 1 to 3 years. After age 1, women should have a CBE every year. Starting at age 71, women should consider having a mammogram (breast X-ray test) every year. Women who have a family history of  breast cancer should talk to their health care provider about genetic screening. Women at a high risk of breast cancer should talk to their health care providers about having an MRI and a mammogram every year.  Breast cancer gene (BRCA)-related cancer risk assessment is recommended for women who have family members with BRCA-related cancers. BRCA-related cancers include breast, ovarian, tubal, and peritoneal cancers. Having family members with these cancers [place] be associated with an increased risk for harmful changes (mutations) in the breast cancer genes BRCA1 and BRCA2. Results of the assessment will determine the need for genetic counseling and BRCA1 and BRCA2 testing.  Routine pelvic exams to screen for cancer are no longer recommended for nonpregnant women who are considered low risk for cancer of the pelvic organs (ovaries, uterus, and vagina) and who do not have symptoms. Ask your health  care provider if a screening pelvic exam is right for you.  If you have had past treatment for cervical cancer or a condition that could lead to cancer, you need Pap tests and screening for cancer for at least 20 years after your treatment. If Pap tests have been discontinued, your risk factors (such as having a new sexual partner) need to be reassessed to determine if screening should be resumed. Some women have medical problems that increase the chance of getting cervical cancer. In these cases, your health care provider [place] recommend more frequent screening and Pap tests.  Colorectal cancer can be detected and often prevented. Most routine colorectal cancer screening begins at the age of 12 years and continues through age 29 years. However, your health care provider [place] recommend screening at an earlier age if you have risk factors for colon cancer. On a yearly basis, your health care provider [place] provide home test kits to check for hidden blood in the stool. Use of a small camera at the end of a tube, to directly examine the colon (sigmoidoscopy or colonoscopy), can detect the earliest forms of colorectal cancer. Talk to your health care provider about this at age 11, when routine screening begins. Direct exam of the colon should be repeated every 5-10 years through age 13 years, unless early forms of pre-cancerous polyps or small growths are found.  Hepatitis C blood testing is recommended for all people born from [birth year] through 1965 and any individual with known risks for hepatitis C.  Pra  Osteoporosis is a disease in which the bones lose minerals and strength with aging. This can result in serious bone fractures or breaks. The risk of osteoporosis can be identified using a bone density scan. Women ages 37 years and over and women at risk for fractures or osteoporosis should discuss screening with their health care providers. Ask your health care provider whether you should take a calcium supplement  or vitamin D to reduce the rate of osteoporosis.  Menopause can be associated with physical symptoms and risks. Hormone replacement therapy is available to decrease symptoms and risks. You should talk to your health care provider about whether hormone replacement therapy is right for you.  Use sunscreen. Apply sunscreen liberally and repeatedly throughout the day. You should seek shade when your shadow is shorter than you. Protect yourself by wearing long sleeves, pants, a wide-brimmed hat, and sunglasses year round, whenever you are outdoors.  Once a month, do a whole body skin exam, using a mirror to look at the skin on your back. Tell your health care provider of new moles, moles that have irregular borders, moles that  are larger than a pencil eraser, or moles that have changed in shape or color.  Stay current with required vaccines (immunizations).  Influenza vaccine. All adults should be immunized every year.  Tetanus, diphtheria, and acellular pertussis (Td, Tdap) vaccine. Pregnant women should receive 1 dose of Tdap vaccine during each pregnancy. The dose should be obtained regardless of the length of time since the last dose. Immunization is preferred during the 27th-36th week of gestation. An adult who has not previously received Tdap or who does not know her vaccine status should receive 1 dose of Tdap. This initial dose should be followed by tetanus and diphtheria toxoids (Td) booster doses every 10 years. Adults with an unknown or incomplete history of completing a 3-dose immunization series with Td-containing vaccines should begin or complete a primary immunization series including a Tdap dose. Adults should receive a Td booster every 10 years.  Varicella vaccine. An adult without evidence of immunity to varicella should receive 2 doses or a second dose if she has previously received 1 dose. Pregnant females who do not have evidence of immunity should receive the first dose after  pregnancy. This first dose should be obtained before leaving the health care facility. The second dose should be obtained 4-8 weeks after the first dose.  Human papillomavirus (HPV) vaccine. Females aged 13-26 years who have not received the vaccine previously should obtain the 3-dose series. The vaccine is not recommended for use in pregnant females. However, pregnancy testing is not needed before receiving a dose. If a female is found to be pregnant after receiving a dose, no treatment is needed. In that case, the remaining doses should be delayed until after the pregnancy. Immunization is recommended for any person with an immunocompromised condition through the age of 56 years if she did not get any or all doses earlier. During the 3-dose series, the second dose should be obtained 4-8 weeks after the first dose. The third dose should be obtained 24 weeks after the first dose and 16 weeks after the second dose.  Zoster vaccine. One dose is recommended for adults aged 67 years or older unless certain conditions are present.  Measles, mumps, and rubella (MMR) vaccine. Adults born before [birth year] generally are considered immune to measles and mumps. Adults born in [birth year] or later should have 1 or more doses of MMR vaccine unless there is a contraindication to the vaccine or there is laboratory evidence of immunity to each of the three diseases. A routine second dose of MMR vaccine should be obtained at least 28 days after the first dose for students attending postsecondary schools, health care workers, or international travelers. People who received inactivated measles vaccine or an unknown type of measles vaccine during 1963-1967 should receive 2 doses of MMR vaccine. People who received inactivated mumps vaccine or an unknown type of mumps vaccine before 1979 and are at high risk for mumps infection should consider immunization with 2 doses of MMR vaccine. For females of childbearing age, rubella immunity should  be determined. If there is no evidence of immunity, females who are not pregnant should be vaccinated. If there is no evidence of immunity, females who are pregnant should delay immunization until after pregnancy. Unvaccinated health care workers born before [birth year] who lack laboratory evidence of measles, mumps, or rubella immunity or laboratory confirmation of disease should consider measles and mumps immunization with 2 doses of MMR vaccine or rubella immunization with 1 dose of MMR vaccine.  Pneumococcal 13-valent conjugate (PCV13) vaccine.  When indicated, a person who is uncertain of her immunization history and has no record of immunization should receive the PCV13 vaccine. An adult aged 73 years or older who has certain medical conditions and has not been previously immunized should receive 1 dose of PCV13 vaccine. This PCV13 should be followed with a dose of pneumococcal polysaccharide (PPSV23) vaccine. The PPSV23 vaccine dose should be obtained at least 8 weeks after the dose of PCV13 vaccine. An adult aged 68 years or older who has certain medical conditions and previously received 1 or more doses of PPSV23 vaccine should receive 1 dose of PCV13. The PCV13 vaccine dose should be obtained 1 or more years after the last PPSV23 vaccine dose.    Pneumococcal polysaccharide (PPSV23) vaccine. When PCV13 is also indicated, PCV13 should be obtained first. All adults aged 32 years and older should be immunized. An adult younger than age 71 years who has certain medical conditions should be immunized. Any person who resides in a nursing home or long-term care facility should be immunized. An adult smoker should be immunized. People with an immunocompromised condition and certain other conditions should receive both PCV13 and PPSV23 vaccines. People with human immunodeficiency virus (HIV) infection should be immunized as soon as possible after diagnosis. Immunization during chemotherapy or radiation therapy should  be avoided. Routine use of PPSV23 vaccine is not recommended for American Indians, [place] Natives, or people younger than 65 years unless there are medical conditions that require PPSV23 vaccine. When indicated, people who have unknown immunization and have no record of immunization should receive PPSV23 vaccine. One-time revaccination 5 years after the first dose of PPSV23 is recommended for people aged 19-64 years who have chronic kidney failure, nephrotic syndrome, asplenia, or immunocompromised conditions. People who received 1-2 doses of PPSV23 before age 33 years should receive another dose of PPSV23 vaccine at age 54 years or later if at least 5 years have passed since the previous dose. Doses of PPSV23 are not needed for people immunized with PPSV23 at or after age 33 years.  Preventive Services / Frequency   Ages 36 to 47 years  Blood pressure check.  Lipid and cholesterol check.  Lung cancer screening. / Every year if you are aged 58-80 years and have a 30-pack-year history of smoking and currently smoke or have quit within the past 15 years. Yearly screening is stopped once you have quit smoking for at least 15 years or develop a health problem that would prevent you from having lung cancer treatment.  Clinical breast exam.** / Every year after age 52 years.  BRCA-related cancer risk assessment.** / For women who have family members with a BRCA-related cancer (breast, ovarian, tubal, or peritoneal cancers).  Mammogram.** / Every year beginning at age 45 years and continuing for as long as you are in good health. Consult with your health care provider.  Pap test.** / Every 3 years starting at age 66 years through age 82 or 49 years with a history of 3 consecutive normal Pap tests.  HPV screening.** / Every 3 years from ages 78 years through ages 58 to 23 years with a history of 3 consecutive normal Pap tests.  Fecal occult blood test (FOBT) of stool. / Every year beginning at age 86  years and continuing until age 61 years. You [place] not need to do this test if you get a colonoscopy every 10 years.  Flexible sigmoidoscopy or colonoscopy.** / Every 5 years for a flexible sigmoidoscopy or every 10  years for a colonoscopy beginning at age 17 years and continuing until age 44 years.  Hepatitis C blood test.** / For all people born from [birth year] through 1965 and any individual with known risks for hepatitis C.  Skin self-exam. / Monthly.  Influenza vaccine. / Every year.  Tetanus, diphtheria, and acellular pertussis (Tdap/Td) vaccine.** / Consult your health care provider. Pregnant women should receive 1 dose of Tdap vaccine during each pregnancy. 1 dose of Td every 10 years.  Zoster vaccine.** / 1 dose for adults aged 33 years or older.  Pneumococcal 13-valent conjugate (PCV13) vaccine.** / Consult your health care provider.  Pneumococcal polysaccharide (PPSV23) vaccine.** / 1 to 2 doses if you smoke cigarettes or if you have certain conditions.  Meningococcal vaccine.** / Consult your health care provider.  Hepatitis A vaccine.** / Consult your health care provider.  Hepatitis B vaccine.** / Consult your health care provider.

## 2014-03-01 LAB — CBC WITH DIFFERENTIAL/PLATELET
Basophils Absolute: 0.1 10*3/uL (ref 0.0–0.1)
Basophils Relative: 3 % — ABNORMAL HIGH (ref 0–1)
EOS ABS: 0.1 10*3/uL (ref 0.0–0.7)
Eosinophils Relative: 4 % (ref 0–5)
HEMATOCRIT: 40.3 % (ref 36.0–46.0)
HEMOGLOBIN: 13.6 g/dL (ref 12.0–15.0)
LYMPHS ABS: 1.6 10*3/uL (ref 0.7–4.0)
Lymphocytes Relative: 54 % — ABNORMAL HIGH (ref 12–46)
MCH: 32.4 pg (ref 26.0–34.0)
MCHC: 33.7 g/dL (ref 30.0–36.0)
MCV: 96 fL (ref 78.0–100.0)
Monocytes Absolute: 0.2 10*3/uL (ref 0.1–1.0)
Monocytes Relative: 7 % (ref 3–12)
NEUTROS ABS: 0.9 10*3/uL — AB (ref 1.7–7.7)
NEUTROS PCT: 32 % — AB (ref 43–77)
Platelets: 171 10*3/uL (ref 150–400)
RBC: 4.2 MIL/uL (ref 3.87–5.11)
RDW: 12.8 % (ref 11.5–15.5)
WBC: 2.9 10*3/uL — ABNORMAL LOW (ref 4.0–10.5)

## 2014-03-01 LAB — LIPID PANEL
CHOL/HDL RATIO: 2.9 ratio
Cholesterol: 174 mg/dL (ref 0–200)
HDL: 61 mg/dL (ref >39)
LDL CALC: 103 mg/dL — AB (ref 0–99)
Triglycerides: 52 mg/dL (ref <150)
VLDL: 10 mg/dL (ref 0–40)

## 2014-03-01 LAB — IRON AND TIBC
%SAT: 39 % (ref 20–55)
IRON: 122 ug/dL (ref 42–145)
TIBC: 316 ug/dL (ref 250–470)
UIBC: 194 ug/dL (ref 125–400)

## 2014-03-01 LAB — URINALYSIS, MICROSCOPIC ONLY
Bacteria, UA: NONE SEEN
Casts: NONE SEEN
Crystals: NONE SEEN
SQUAMOUS EPITHELIAL / LPF: NONE SEEN

## 2014-03-01 LAB — MICROALBUMIN / CREATININE URINE RATIO
Creatinine, Urine: 99 mg/dL
Microalb Creat Ratio: 2 mg/g (ref 0.0–30.0)
Microalb, Ur: 0.2 mg/dL (ref <2.0)

## 2014-03-01 LAB — BASIC METABOLIC PANEL WITH GFR
BUN: 15 mg/dL (ref 6–23)
CO2: 29 meq/L (ref 19–32)
Calcium: 9.3 mg/dL (ref 8.4–10.5)
Chloride: 104 mEq/L (ref 96–112)
Creat: 0.96 mg/dL (ref 0.50–1.10)
GFR, Est African American: 73 mL/min
GFR, Est Non African American: 63 mL/min
GLUCOSE: 95 mg/dL (ref 70–99)
POTASSIUM: 4.4 meq/L (ref 3.5–5.3)
Sodium: 141 mEq/L (ref 135–145)

## 2014-03-01 LAB — VITAMIN D 25 HYDROXY (VIT D DEFICIENCY, FRACTURES): Vit D, 25-Hydroxy: 77 ng/mL (ref 30–89)

## 2014-03-01 LAB — HEPATIC FUNCTION PANEL
ALT: 21 U/L (ref 0–35)
AST: 28 U/L (ref 0–37)
Albumin: 4 g/dL (ref 3.5–5.2)
Alkaline Phosphatase: 58 U/L (ref 39–117)
BILIRUBIN INDIRECT: 0.9 mg/dL (ref 0.2–1.2)
Bilirubin, Direct: 0.2 mg/dL (ref 0.0–0.3)
Total Bilirubin: 1.1 mg/dL (ref 0.2–1.2)
Total Protein: 6.4 g/dL (ref 6.0–8.3)

## 2014-03-01 LAB — TSH: TSH: 1.652 u[IU]/mL (ref 0.350–4.500)

## 2014-03-01 LAB — HEMOGLOBIN A1C
HEMOGLOBIN A1C: 5.5 % (ref <5.7)
Mean Plasma Glucose: 111 mg/dL (ref <117)

## 2014-03-01 LAB — VITAMIN B12: Vitamin B-12: 581 pg/mL (ref 211–911)

## 2014-03-01 LAB — MAGNESIUM: Magnesium: 2.1 mg/dL (ref 1.5–2.5)

## 2014-03-01 LAB — INSULIN, FASTING: Insulin fasting, serum: 15.9 u[IU]/mL (ref 2.0–19.6)

## 2014-03-16 ENCOUNTER — Encounter: Payer: Self-pay | Admitting: Internal Medicine

## 2014-03-16 ENCOUNTER — Other Ambulatory Visit (INDEPENDENT_AMBULATORY_CARE_PROVIDER_SITE_OTHER): Payer: BC Managed Care – PPO | Admitting: *Deleted

## 2014-03-16 DIAGNOSIS — Z1212 Encounter for screening for malignant neoplasm of rectum: Secondary | ICD-10-CM

## 2014-03-16 LAB — POC HEMOCCULT BLD/STL (HOME/3-CARD/SCREEN)
Card #3 Fecal Occult Blood, POC: NEGATIVE
FECAL OCCULT BLD: NEGATIVE
FECAL OCCULT BLD: NEGATIVE

## 2014-03-16 LAB — TB SKIN TEST
Induration: 0 mm
TB Skin Test: NEGATIVE

## 2014-03-17 ENCOUNTER — Encounter: Payer: Self-pay | Admitting: Internal Medicine

## 2014-04-01 ENCOUNTER — Other Ambulatory Visit: Payer: Self-pay | Admitting: Physician Assistant

## 2014-04-05 ENCOUNTER — Other Ambulatory Visit: Payer: Self-pay | Admitting: *Deleted

## 2014-04-05 DIAGNOSIS — R899 Unspecified abnormal finding in specimens from other organs, systems and tissues: Secondary | ICD-10-CM

## 2014-04-06 ENCOUNTER — Other Ambulatory Visit: Payer: BC Managed Care – PPO

## 2014-04-06 DIAGNOSIS — R899 Unspecified abnormal finding in specimens from other organs, systems and tissues: Secondary | ICD-10-CM

## 2014-04-06 DIAGNOSIS — R799 Abnormal finding of blood chemistry, unspecified: Secondary | ICD-10-CM

## 2014-04-06 LAB — CBC WITH DIFFERENTIAL/PLATELET
BASOS ABS: 0 10*3/uL (ref 0.0–0.1)
Basophils Relative: 0 % (ref 0–1)
EOS PCT: 3 % (ref 0–5)
Eosinophils Absolute: 0.1 10*3/uL (ref 0.0–0.7)
HCT: 39.7 % (ref 36.0–46.0)
Hemoglobin: 13.7 g/dL (ref 12.0–15.0)
Lymphocytes Relative: 39 % (ref 12–46)
Lymphs Abs: 1.8 10*3/uL (ref 0.7–4.0)
MCH: 32.5 pg (ref 26.0–34.0)
MCHC: 34.5 g/dL (ref 30.0–36.0)
MCV: 94.1 fL (ref 78.0–100.0)
MPV: 8.2 fL — ABNORMAL LOW (ref 9.4–12.4)
Monocytes Absolute: 0.4 10*3/uL (ref 0.1–1.0)
Monocytes Relative: 8 % (ref 3–12)
NEUTROS PCT: 50 % (ref 43–77)
Neutro Abs: 2.3 10*3/uL (ref 1.7–7.7)
PLATELETS: 214 10*3/uL (ref 150–400)
RBC: 4.22 MIL/uL (ref 3.87–5.11)
RDW: 12.8 % (ref 11.5–15.5)
WBC: 4.6 10*3/uL (ref 4.0–10.5)

## 2014-05-16 ENCOUNTER — Telehealth: Payer: Self-pay | Admitting: *Deleted

## 2014-05-16 NOTE — Telephone Encounter (Signed)
ORDER FOR BMD TO [facility] FOR 05/23/14 AT 8:30 PLEASE SEND IF QUESTIONS CONTACT PT  THX

## 2014-08-27 ENCOUNTER — Other Ambulatory Visit: Payer: Self-pay | Admitting: Internal Medicine

## 2014-08-29 ENCOUNTER — Other Ambulatory Visit: Payer: Self-pay | Admitting: *Deleted

## 2014-08-29 MED ORDER — LORAZEPAM 2 MG PO TABS: 2.0000 mg | ORAL_TABLET | Freq: Three times a day (TID) | ORAL | Status: DC | PRN

## 2014-09-10 ENCOUNTER — Encounter: Payer: Self-pay | Admitting: *Deleted

## 2014-09-25 ENCOUNTER — Encounter: Payer: Self-pay | Admitting: *Deleted

## 2015-03-13 ENCOUNTER — Ambulatory Visit (INDEPENDENT_AMBULATORY_CARE_PROVIDER_SITE_OTHER): Payer: BLUE CROSS/BLUE SHIELD | Admitting: Internal Medicine

## 2015-03-13 ENCOUNTER — Encounter: Payer: Self-pay | Admitting: Internal Medicine

## 2015-03-13 VITALS — BP 106/68 | HR 64 | Temp 97.5°F | Resp 16 | Ht 65.75 in | Wt 182.2 lb

## 2015-03-13 DIAGNOSIS — Z6829 Body mass index (BMI) 29.0-29.9, adult: Secondary | ICD-10-CM

## 2015-03-13 DIAGNOSIS — R030 Elevated blood-pressure reading, without diagnosis of hypertension: Secondary | ICD-10-CM

## 2015-03-13 DIAGNOSIS — E559 Vitamin D deficiency, unspecified: Secondary | ICD-10-CM | POA: Diagnosis not present

## 2015-03-13 DIAGNOSIS — Z0000 Encounter for general adult medical examination without abnormal findings: Secondary | ICD-10-CM

## 2015-03-13 DIAGNOSIS — Z111 Encounter for screening for respiratory tuberculosis: Secondary | ICD-10-CM | POA: Diagnosis not present

## 2015-03-13 DIAGNOSIS — Z1212 Encounter for screening for malignant neoplasm of rectum: Secondary | ICD-10-CM

## 2015-03-13 DIAGNOSIS — E039 Hypothyroidism, unspecified: Secondary | ICD-10-CM

## 2015-03-13 DIAGNOSIS — E7800 Pure hypercholesterolemia, unspecified: Secondary | ICD-10-CM

## 2015-03-13 DIAGNOSIS — R5383 Other fatigue: Secondary | ICD-10-CM

## 2015-03-13 DIAGNOSIS — Z0001 Encounter for general adult medical examination with abnormal findings: Secondary | ICD-10-CM

## 2015-03-13 DIAGNOSIS — Z79899 Other long term (current) drug therapy: Secondary | ICD-10-CM | POA: Diagnosis not present

## 2015-03-13 DIAGNOSIS — IMO0001 Reserved for inherently not codable concepts without codable children: Secondary | ICD-10-CM

## 2015-03-13 DIAGNOSIS — R7309 Other abnormal glucose: Secondary | ICD-10-CM

## 2015-03-13 LAB — BASIC METABOLIC PANEL WITH GFR
BUN: 13 mg/dL (ref 7–25)
CHLORIDE: 104 mmol/L (ref 98–110)
CO2: 28 mmol/L (ref 20–31)
Calcium: 9.1 mg/dL (ref 8.6–10.4)
Creat: 0.81 mg/dL (ref 0.50–0.99)
GFR, EST NON AFRICAN AMERICAN: 77 mL/min (ref >=60)
GFR, Est African American: 89 mL/min (ref >=60)
GLUCOSE: 64 mg/dL — AB (ref 65–99)
POTASSIUM: 4.1 mmol/L (ref 3.5–5.3)
Sodium: 141 mmol/L (ref 135–146)

## 2015-03-13 LAB — IRON AND TIBC
%SAT: 42 % (ref 11–50)
IRON: 126 ug/dL (ref 45–160)
TIBC: 299 ug/dL (ref 250–450)
UIBC: 173 ug/dL (ref 125–400)

## 2015-03-13 LAB — CBC WITH DIFFERENTIAL/PLATELET
BASOS ABS: 0 10*3/uL (ref 0.0–0.1)
Basophils Relative: 1 % (ref 0–1)
EOS PCT: 3 % (ref 0–5)
Eosinophils Absolute: 0.1 10*3/uL (ref 0.0–0.7)
HEMATOCRIT: 40.4 % (ref 36.0–46.0)
HEMOGLOBIN: 13.7 g/dL (ref 12.0–15.0)
LYMPHS ABS: 1.6 10*3/uL (ref 0.7–4.0)
LYMPHS PCT: 37 % (ref 12–46)
MCH: 32.4 pg (ref 26.0–34.0)
MCHC: 33.9 g/dL (ref 30.0–36.0)
MCV: 95.5 fL (ref 78.0–100.0)
MPV: 8.7 fL (ref 8.6–12.4)
Monocytes Absolute: 0.2 10*3/uL (ref 0.1–1.0)
Monocytes Relative: 5 % (ref 3–12)
NEUTROS ABS: 2.4 10*3/uL (ref 1.7–7.7)
Neutrophils Relative %: 54 % (ref 43–77)
Platelets: 166 10*3/uL (ref 150–400)
RBC: 4.23 MIL/uL (ref 3.87–5.11)
RDW: 12.8 % (ref 11.5–15.5)
WBC: 4.4 10*3/uL (ref 4.0–10.5)

## 2015-03-13 LAB — HEPATIC FUNCTION PANEL
ALBUMIN: 4 g/dL (ref 3.6–5.1)
ALK PHOS: 61 U/L (ref 33–130)
ALT: 13 U/L (ref 6–29)
AST: 23 U/L (ref 10–35)
BILIRUBIN INDIRECT: 0.7 mg/dL (ref 0.2–1.2)
Bilirubin, Direct: 0.1 mg/dL (ref <=0.2)
TOTAL PROTEIN: 6.2 g/dL (ref 6.1–8.1)
Total Bilirubin: 0.8 mg/dL (ref 0.2–1.2)

## 2015-03-13 LAB — LIPID PANEL
Cholesterol: 179 mg/dL (ref 125–200)
HDL: 57 mg/dL (ref >=46)
LDL Cholesterol: 107 mg/dL (ref <130)
Total CHOL/HDL Ratio: 3.1 Ratio (ref <=5.0)
Triglycerides: 75 mg/dL (ref <150)
VLDL: 15 mg/dL (ref <30)

## 2015-03-13 LAB — HEMOGLOBIN A1C
HEMOGLOBIN A1C: 5.3 % (ref <5.7)
MEAN PLASMA GLUCOSE: 105 mg/dL (ref <117)

## 2015-03-13 LAB — TSH: TSH: 1.118 u[IU]/mL (ref 0.350–4.500)

## 2015-03-13 LAB — MAGNESIUM: Magnesium: 2.1 mg/dL (ref 1.5–2.5)

## 2015-03-13 NOTE — Progress Notes (Signed)
Patient ID: Shannon [name], female   DOB: 01/13/1951, 64 y.o.   MRN: [id number]   Annual Wellness/Preventative Visit  & Comprehensive Evaluation &  Examination     This very nice 64 y.o. DWF presents for presents for a Wellness/Preventative Visit & comprehensive evaluation and management of multiple medical co-morbidities.  Patient has been followed expectantly and screened for elevated BP, Cholesterol, Glucose and Vitamin D Deficiency.    Patient was found Hypothyroid in 2005 due to Hashimoto's thyroiditis and begun on thyroid replacement.  A previous doctor had placed her on [place] and she has been advised of my opinion that [place] Thyroid is not felt appropriate treatment as endorsed by the [employer] [facility] [employer]. Patient is also screened expectantly for elevated BP. Patient's BP has been controlled at home and patient denies any cardiac symptoms as chest pain, palpitations, shortness of breath, dizziness or ankle swelling. Today's BP: 106/68 mmHg      Patient's hyperlipidemia is fairly well controlled with diet. Last lipids were marginally at goal with Total Chol 174, HDL 61, Trig 52 and borderline elevated LDL 103.    Patient has hx/o gestational diabetes with her 2sd pregnancy 31 years ago and is monitored and screened expectantly since and patient denies reactive hypoglycemic symptoms, visual blurring, diabetic polys, or paresthesias. Last A1c was 5.5% in Nov 2015.      Finally, patient has history of Vitamin D Deficiency and last Vitamin D was 68 in Nov 2015.    Medication Sig  . VITAMIN D 2000 UNITS CAPS Take by mouth 2 (two) times daily.   [name] GAMMA AMINOBUTYRIC ACID PO Take 200 mg by mouth 2 (two) times daily.  [name] LEVOXYL 50 MCG tablet Takes 50 mcg on M,W,F and 100 mcg the other 4 days of the week.  [name] LEXAPRO 10 MG tablet TAKE 1 TABLET ONCE DAILY.  [name] LORazepam  2 MG tablet Take 1 tablet (2 mg total) by mouth every 8 (eight) hours as needed for anxiety.  [name] MAGNESIUM GLYCINATE  PLUS Take 100 mg by mouth 2 (two) times daily.  . meloxicam  15 MG tablet Take 15 mg by mouth daily. PRN  . Omega-3 FISH OIL Take 1,200 mg by mouth daily.  [name] PROBIOTIC Take 1 capsule by mouth daily.    Francia Greaves THYROID 15 MG tablet Take by mouth. 7.5 mg qd   Vit B12 1000 mcg tab daily   Influenzinum  takes 4 Pellets weekly to avoid taking the flu shot  . Coenzyme Q10 100 MG CAPS Take 1 capsule by mouth daily.   No Known Allergies   Past Medical History  Diagnosis Date  . Anxiety   . Cancer ([place])     melanoma- lip 2011  . Depression   . ADD (attention deficit disorder with hyperactivity)   . Diverticulosis   . Asthma   . Hypothyroidism   . Osteopenia   . Rosacea   . Menopause    Health Maintenance  Topic Date Due  . PAP SMEAR  10/17/2014  . INFLUENZA VACCINE  11/28/2014  . COLONOSCOPY  09/06/2015  . MAMMOGRAM  05/23/2016  . TETANUS/TDAP  09/06/2019  . ZOSTAVAX  Completed  . Hepatitis C Screening  Completed  . HIV Screening  Completed   Immunization History  Administered Date(s) Administered  . Influenza Split 03/08/2013  . Influenza-Unspecified 03/07/2014  . PPD Test 03/08/2013, 02/28/2014  . Pneumococcal-Unspecified 04/08/2011  . Tdap 09/05/2009  . Zoster 02/28/2014   Past Surgical  History  Procedure Laterality Date  . Tonsillectomy and adenoidectomy    . Dilation and curettage of uterus     Family History  Problem Relation Age of Onset  . Colon polyps Mother   . Parkinson's disease Mother   . Colon cancer Maternal Grandfather   . Diabetes Father   . Emphysema Father   . Tuberculosis Father    Social History  Substance Use Topics  . Smoking status: Never Smoker   . Smokeless tobacco: Never Used  . Alcohol Use: 0.0 oz/week    1-2 Glasses of wine per week     Comment: rare    ROS Constitutional: Denies fever, chills, weight loss/gain, headaches, insomnia,  night sweats, and change in appetite. Does c/o fatigue. Eyes: Denies redness, blurred vision,  diplopia, discharge, itchy, watery eyes.  ENT: Denies discharge, congestion, post nasal drip, epistaxis, sore throat, earache, hearing loss, dental pain, Tinnitus, Vertigo, Sinus pain, snoring.  Cardio: Denies chest pain, palpitations, irregular heartbeat, syncope, dyspnea, diaphoresis, orthopnea, PND, claudication, edema Respiratory: denies cough, dyspnea, DOE, pleurisy, hoarseness, laryngitis, wheezing.  Gastrointestinal: Denies dysphagia, heartburn, reflux, water brash, pain, cramps, nausea, vomiting, bloating, diarrhea, constipation, hematemesis, melena, hematochezia, jaundice, hemorrhoids Genitourinary: Denies dysuria, frequency, urgency, nocturia, hesitancy, discharge, hematuria, flank pain Breast: Breast lumps, nipple discharge, bleeding.  Musculoskeletal: Denies arthralgia, myalgia, stiffness, Jt. Swelling, pain, limp, and strain/sprain. Denies falls. Skin: Denies puritis, rash, hives, warts, acne, eczema, changing in skin lesion Neuro: No weakness, tremor, incoordination, spasms, paresthesia, pain Psychiatric: Denies confusion, memory loss, sensory loss. Denies Depression. Endocrine: Denies change in weight, skin, hair change, nocturia, and paresthesia, diabetic polys, visual blurring, hyper / hypo glycemic episodes.  Heme/Lymph: No excessive bleeding, bruising, enlarged lymph nodes.  Physical Exam  BP 106/68 mmHg  Pulse 64  Temp(Src) 97.5 F (36.4 C)  Resp 16  Ht 5' 5.75" (1.67 m)  Wt 182 lb 3.2 oz (82.645 kg)  BMI 29.63 kg/m2  LMP 03/29/2004  General Appearance: Well nourished and in no apparent distress. Eyes: PERRLA, EOMs, conjunctiva no swelling or erythema, normal fundi and vessels. Sinuses: No frontal/maxillary tenderness ENT/Mouth: EACs patent / TMs  nl. Nares clear without erythema, swelling, mucoid exudates. Oral hygiene is good. No erythema, swelling, or exudate. Tongue normal, non-obstructing. Tonsils not swollen or erythematous. Hearing normal.  Neck: Supple,  thyroid normal. No bruits, nodes or JVD. Respiratory: Respiratory effort normal.  BS equal and clear bilateral without rales, rhonci, wheezing or stridor. Cardio: Heart sounds are normal with regular rate and rhythm and no murmurs, rubs or gallops. Peripheral pulses are normal and equal bilaterally without edema. No aortic or femoral bruits. Chest: symmetric with normal excursions and percussion. Breasts: Symmetric, without lumps, nipple discharge, retractions, or fibrocystic changes.  Abdomen: Flat, soft, with bowel sounds. Nontender, no guarding, rebound, hernias, masses, or organomegaly.  Lymphatics: Non tender without lymphadenopathy.  Musculoskeletal: Full ROM all peripheral extremities, joint stability, 5/5 strength, and normal gait. Skin: Warm and dry without rashes, lesions, cyanosis, clubbing or  ecchymosis.  Neuro: Cranial nerves intact, reflexes equal bilaterally. Normal muscle tone, no cerebellar symptoms. Sensation intact.  Pysch: Alert and oriented X 3, normal affect, Insight and Judgment appropriate.   Assessment and Plan  1. Annual Preventative Visit  - Iron and TIBC - Urinalysis, Routine w reflex microscopic - CBC with Differential/Platelet - BASIC METABOLIC PANEL WITH GFR - Hepatic function panel - Magnesium - Lipid panel - TSH - Hemoglobin A1c - Insulin, random - VITAMIN D 25 Hydroxy   2. Elevated  BP  - TSH  3. Elevated cholesterol  - Lipid panel  4. Other abnormal glucose  - Hemoglobin A1c - Insulin, random  5. Vitamin D deficiency  - VITAMIN D 25 Hydroxy  6. Hypothyroidism   7. Screening for rectal cancer   8. Medication management  - Urinalysis, Routine w reflex microscopic  - CBC with Differential/Platelet - BASIC METABOLIC PANEL WITH GFR - Hepatic function panel - Magnesium  9. Other fatigue  - Iron and TIBC - TSH  10. Screening examination for pulmonary tuberculosis  - PPD  11. BMI 29.0-29.9,adult   Continue prudent diet  as discussed, weight control, BP monitoring, regular exercise, and medications. Discussed med's effects and SE's. Screening labs and tests as requested with regular follow-up as recommended. Patient declined recommended EKG & AoScan.   Patient declined recommended Fluvax. Over 40 minutes of exam, counseling, chart review was performed.

## 2015-03-13 NOTE — Patient Instructions (Signed)
Recommend Adult Low Dose Aspirin or   coated  Aspirin 81 mg daily   To reduce risk of Colon Cancer 20 %,   Skin Cancer 26 % ,   Melanoma 46%   and   Pancreatic cancer 60%   ++++++++++++++++++++++++++++++++++++++++++++++++++++++  Vitamin D goal   is between 70-100.   Please make sure that you are taking your Vitamin D as directed.   It is very important as a natural anti-inflammatory   helping hair, skin, and nails, as well as reducing stroke and heart attack risk.   It helps your bones and helps with mood.  It also decreases numerous cancer risks so please take it as directed.   Low Vit D is associated with a 200-300% higher risk for CANCER   and 200-300% higher risk for HEART   ATTACK  &  STROKE.   ......................................  It is also associated with higher death rate at younger ages,   autoimmune diseases like Rheumatoid arthritis, Lupus, Multiple Sclerosis.     Also many other serious conditions, like depression, Alzheimer's  Dementia, infertility, muscle aches, fatigue, fibromyalgia - just to name a few.  ++++++++++++++++++++++++++++++++++++++++++++++++  Recommend the book "The END of DIETING" by Dr Joel Fuhrman   & the book "The END of DIABETES " by Dr Joel Fuhrman  At [employer] - get book & Audio CD's     Being diabetic has a  300% increased risk for heart attack, stroke, cancer, and alzheimer- type vascular dementia. It is very important that you work harder with diet by avoiding all foods that are white. Avoid white rice (brown & wild rice is OK), white potatoes (sweetpotatoes in moderation is OK), White bread or wheat bread or anything made out of white flour like bagels, donuts, rolls, buns, biscuits, cakes, pastries, cookies, pizza crust, and pasta (made from white flour & egg whites) - vegetarian pasta or spinach or wheat pasta is OK. Multigrain breads like Arnold's or Pepperidge [employer], or multigrain sandwich thins or flatbreads.  Diet,  exercise and weight loss can reverse and cure diabetes in the early stages.  Diet, exercise and weight loss is very important in the control and prevention of complications of diabetes which affects every system in your body, ie. Brain - dementia/stroke, eyes - glaucoma/blindness, heart - heart attack/heart failure, kidneys - dialysis, stomach - gastric paralysis, intestines - malabsorption, nerves - severe painful neuritis, circulation - gangrene & loss of a leg(s), and finally cancer and Alzheimers.    I recommend avoid fried & greasy foods,  sweets/candy, white rice (brown or wild rice or Quinoa is OK), white potatoes (sweet potatoes are OK) - anything made from white flour - bagels, doughnuts, rolls, buns, biscuits,white and wheat breads, pizza crust and traditional pasta made of white flour & egg white(vegetarian pasta or spinach or wheat pasta is OK).  Multi-grain bread is OK - like multi-grain flat bread or sandwich thins. Avoid alcohol in excess. Exercise is also important.    Eat all the vegetables you want - avoid meat, especially red meat and dairy - especially cheese.  Cheese is the most concentrated form of trans-fats which is the worst thing to clog up our arteries. Veggie cheese is OK which can be found in the fresh produce section at [employer] or [employer] or [employer]  ++++++++++++++++++++++++++++++++++++++++++++++++++ DASH Eating Plan  DASH stands for "Dietary Approaches to Stop Hypertension."   The DASH eating plan is a healthy eating plan that has been shown to reduce high   blood pressure (hypertension). Additional health benefits Sonji include reducing the risk of type 2 diabetes mellitus, heart disease, and stroke. The DASH eating plan Mariell also help with weight loss.  WHAT DO I NEED TO KNOW ABOUT THE DASH EATING PLAN? For the DASH eating plan, you will follow these general guidelines:  Choose foods with a percent daily value for sodium of less than 5% (as listed on the food  label).  Use salt-free seasonings or herbs instead of table salt or sea salt.  Check with your health care provider or pharmacist before using salt substitutes.  Eat lower-sodium products, often labeled as "lower sodium" or "no salt added."  Eat fresh foods.  Eat more vegetables, fruits, and low-fat dairy products.    Choose whole grains. Look for the word "whole" as the first word in the ingredient list.  Choose fish   Limit sweets, desserts, sugars, and sugary drinks.  Choose heart-healthy fats.  Eat veggie cheese   Eat more home-cooked food and less restaurant, buffet, and fast food.  Limit fried foods.  Cook foods using methods other than frying.  Limit canned vegetables. If you do use them, rinse them well to decrease the sodium.  When eating at a restaurant, ask that your food be prepared with less salt, or no salt if possible.                      WHAT FOODS CAN I EAT?  Seek help from a dietitian for individual calorie needs.  Grains Whole grain or whole wheat bread. Brown rice. Whole grain or whole wheat pasta. Quinoa, bulgur, and whole grain cereals. Low-sodium cereals. Corn or whole wheat flour tortillas. Whole grain cornbread. Whole grain crackers. Low-sodium crackers.  Vegetables Fresh or frozen vegetables (raw, steamed, roasted, or grilled). Low-sodium or reduced-sodium tomato and vegetable juices. Low-sodium or reduced-sodium tomato sauce and paste. Low-sodium or reduced-sodium canned vegetables.   Fruits All fresh, canned (in natural juice), or frozen fruits.  Protein Products  All fish and seafood.  Dried beans, peas, or lentils. Unsalted nuts and seeds. Unsalted canned beans.  Dairy Low-fat dairy products, such as skim or 1% milk, 2% or reduced-fat cheeses, low-fat ricotta or cottage cheese, or plain low-fat yogurt. Low-sodium or reduced-sodium cheeses.  Fats and Oils Tub margarines without trans fats. Light or reduced-fat mayonnaise and salad  dressings (reduced sodium). Avocado. Safflower, olive, or canola oils. Natural peanut or almond butter.  Other Unsalted popcorn and pretzels. The items listed above Cicilia not be a complete list of recommended foods or beverages. Contact your dietitian for more options.  +++++++++++++++++++++++++++++++++++++++++++  WHAT FOODS ARE NOT RECOMMENDED?  Grains/ White flour or wheat flour  White bread. White pasta. White rice. Refined cornbread. Bagels and croissants. Crackers that contain trans fat.  Vegetables  Creamed or fried vegetables. Vegetables in a . Regular canned vegetables. Regular canned tomato sauce and paste. Regular tomato and vegetable juices.  Fruits Dried fruits. Canned fruit in light or heavy syrup. Fruit juice.  Meat and Other Protein Products Meat in general. Fatty cuts of meat. Ribs, chicken wings, bacon, sausage, bologna, salami, chitterlings, fatback, hot dogs, bratwurst, and packaged luncheon meats.  Dairy Whole or 2% milk, cream, half-and-half, and cream cheese. Whole-fat or sweetened yogurt. Full-fat cheeses or blue cheese. Nondairy creamers and whipped toppings. Processed cheese, cheese spreads, or cheese curds.  Condiments Onion and garlic salt, seasoned salt, table salt, and sea salt. Canned and packaged gravies. Worcestershire sauce. Tartar sauce.  Barbecue sauce. Teriyaki sauce. Soy sauce, including reduced sodium. Steak sauce. Fish sauce. Oyster sauce. Cocktail sauce. Horseradish. Ketchup and mustard. Meat flavorings and tenderizers. Bouillon cubes. Hot sauce. Tabasco sauce. Marinades. Taco seasonings. Relishes.  Fats and Oils Butter, stick margarine, lard, shortening, ghee, and bacon fat. Coconut, palm kernel, or palm oils. Regular salad dressings.  Pickles and olives. Salted popcorn and pretzels. The items listed above Maytte not be a complete list of foods and beverages to avoid.   Preventive Care for Adults  A healthy lifestyle and preventive care can  promote health and wellness. Preventive health guidelines for women include the following key practices.  A routine yearly physical is a good way to check with your health care provider about your health and preventive screening. It is a chance to share any concerns and updates on your health and to receive a thorough exam.  Visit your dentist for a routine exam and preventive care every 6 months. Brush your teeth twice a day and floss once a day. Good oral hygiene prevents tooth decay and gum disease.  The frequency of eye exams is based on your age, health, family medical history, use of contact lenses, and other factors. Follow your health care provider's recommendations for frequency of eye exams.  Eat a healthy diet. Foods like vegetables, fruits, whole grains, low-fat dairy products, and lean protein foods contain the nutrients you need without too many calories. Decrease your intake of foods high in solid fats, added sugars, and salt. Eat the right amount of calories for you.Get information about a proper diet from your health care provider, if necessary.  Regular physical exercise is one of the most important things you can do for your health. Most adults should get at least 150 minutes of moderate-intensity exercise (any activity that increases your heart rate and causes you to sweat) each week. In addition, most adults need muscle-strengthening exercises on 2 or more days a week.  Maintain a healthy weight. The body mass index (BMI) is a screening tool to identify possible weight problems. It provides an estimate of body fat based on height and weight. Your health care provider can find your BMI and can help you achieve or maintain a healthy weight.For adults 20 years and older:  A BMI below 18.5 is considered underweight.  A BMI of 18.5 to 24.9 is normal.  A BMI of 25 to 29.9 is considered overweight.  A BMI of 30 and above is considered obese.  Maintain normal blood lipids and  cholesterol levels by exercising and minimizing your intake of saturated fat. Eat a balanced diet with plenty of fruit and vegetables. Blood tests for lipids and cholesterol should begin at age 31 and be repeated every 5 years. If your lipid or cholesterol levels are high, you are over 50, or you are at high risk for heart disease, you Jennett need your cholesterol levels checked more frequently.Ongoing high lipid and cholesterol levels should be treated with medicines if diet and exercise are not working.  If you smoke, find out from your health care provider how to quit. If you do not use tobacco, do not start.  Lung cancer screening is recommended for adults aged 83-80 years who are at high risk for developing lung cancer because of a history of smoking. A yearly low-dose CT scan of the lungs is recommended for people who have at least a 30-pack-year history of smoking and are a current smoker or have quit within the past 15 years.  A pack year of smoking is smoking an average of 1 pack of cigarettes a day for 1 year (for example: 1 pack a day for 30 years or 2 packs a day for 15 years). Yearly screening should continue until the smoker has stopped smoking for at least 15 years. Yearly screening should be stopped for people who develop a health problem that would prevent them from having lung cancer treatment.  High blood pressure causes heart disease and increases the risk of stroke. Your blood pressure should be checked at least every 1 to 2 years. Ongoing high blood pressure should be treated with medicines if weight loss and exercise do not work.  If you are 61-30 years old, ask your health care provider if you should take aspirin to prevent strokes.  Diabetes screening involves taking a blood sample to check your fasting blood sugar level. This should be done once every 3 years, after age 9, if you are within normal weight and without risk factors for diabetes. Testing should be considered at a  younger age or be carried out more frequently if you are overweight and have at least 1 risk factor for diabetes.  Breast cancer screening is essential preventive care for women. You should practice "breast self-awareness." This means understanding the normal appearance and feel of your breasts and Ardean include breast self-examination. Any changes detected, no matter how small, should be reported to a health care provider. Women in their 74s and 30s should have a clinical breast exam (CBE) by a health care provider as part of a regular health exam every 1 to 3 years. After age 75, women should have a CBE every year. Starting at age 19, women should consider having a mammogram (breast X-ray test) every year. Women who have a family history of breast cancer should talk to their health care provider about genetic screening. Women at a high risk of breast cancer should talk to their health care providers about having an MRI and a mammogram every year.  Breast cancer gene (BRCA)-related cancer risk assessment is recommended for women who have family members with BRCA-related cancers. BRCA-related cancers include breast, ovarian, tubal, and peritoneal cancers. Having family members with these cancers Maricruz be associated with an increased risk for harmful changes (mutations) in the breast cancer genes BRCA1 and BRCA2. Results of the assessment will determine the need for genetic counseling and BRCA1 and BRCA2 testing.  Routine pelvic exams to screen for cancer are no longer recommended for nonpregnant women who are considered low risk for cancer of the pelvic organs (ovaries, uterus, and vagina) and who do not have symptoms. Ask your health care provider if a screening pelvic exam is right for you.  If you have had past treatment for cervical cancer or a condition that could lead to cancer, you need Pap tests and screening for cancer for at least 20 years after your treatment. If Pap tests have been discontinued, your  risk factors (such as having a new sexual partner) need to be reassessed to determine if screening should be resumed. Some women have medical problems that increase the chance of getting cervical cancer. In these cases, your health care provider Basilia recommend more frequent screening and Pap tests.  Colorectal cancer can be detected and often prevented. Most routine colorectal cancer screening begins at the age of 23 years and continues through age 42 years. However, your health care provider Caresse recommend screening at an earlier age if you have risk factors for colon  cancer. On a yearly basis, your health care provider Ebonye provide home test kits to check for hidden blood in the stool. Use of a small camera at the end of a tube, to directly examine the colon (sigmoidoscopy or colonoscopy), can detect the earliest forms of colorectal cancer. Talk to your health care provider about this at age 50, when routine screening begins. Direct exam of the colon should be repeated every 5-10 years through age 75 years, unless early forms of pre-cancerous polyps or small growths are found.  Hepatitis C blood testing is recommended for all people born from 1945 through 1965 and any individual with known risks for hepatitis C.  Pra  Osteoporosis is a disease in which the bones lose minerals and strength with aging. This can result in serious bone fractures or breaks. The risk of osteoporosis can be identified using a bone density scan. Women ages 65 years and over and women at risk for fractures or osteoporosis should discuss screening with their health care providers. Ask your health care provider whether you should take a calcium supplement or vitamin D to reduce the rate of osteoporosis.  Menopause can be associated with physical symptoms and risks. Hormone replacement therapy is available to decrease symptoms and risks. You should talk to your health care provider about whether hormone replacement therapy is right  for you.  Use sunscreen. Apply sunscreen liberally and repeatedly throughout the day. You should seek shade when your shadow is shorter than you. Protect yourself by wearing long sleeves, pants, a wide-brimmed hat, and sunglasses year round, whenever you are outdoors.  Once a month, do a whole body skin exam, using a mirror to look at the skin on your back. Tell your health care provider of new moles, moles that have irregular borders, moles that are larger than a pencil eraser, or moles that have changed in shape or color.  Stay current with required vaccines (immunizations).  Influenza vaccine. All adults should be immunized every year.  Tetanus, diphtheria, and acellular pertussis (Td, Tdap) vaccine. Pregnant women should receive 1 dose of Tdap vaccine during each pregnancy. The dose should be obtained regardless of the length of time since the last dose. Immunization is preferred during the 27th-36th week of gestation. An adult who has not previously received Tdap or who does not know her vaccine status should receive 1 dose of Tdap. This initial dose should be followed by tetanus and diphtheria toxoids (Td) booster doses every 10 years. Adults with an unknown or incomplete history of completing a 3-dose immunization series with Td-containing vaccines should begin or complete a primary immunization series including a Tdap dose. Adults should receive a Td booster every 10 years.  Varicella vaccine. An adult without evidence of immunity to varicella should receive 2 doses or a second dose if she has previously received 1 dose. Pregnant females who do not have evidence of immunity should receive the first dose after pregnancy. This first dose should be obtained before leaving the health care facility. The second dose should be obtained 4-8 weeks after the first dose.  Human papillomavirus (HPV) vaccine. Females aged 13-26 years who have not received the vaccine previously should obtain the 3-dose  series. The vaccine is not recommended for use in pregnant females. However, pregnancy testing is not needed before receiving a dose. If a female is found to be pregnant after receiving a dose, no treatment is needed. In that case, the remaining doses should be delayed until after the pregnancy. Immunization   is recommended for any person with an immunocompromised condition through the age of 26 years if she did not get any or all doses earlier. During the 3-dose series, the second dose should be obtained 4-8 weeks after the first dose. The third dose should be obtained 24 weeks after the first dose and 16 weeks after the second dose.  Zoster vaccine. One dose is recommended for adults aged 60 years or older unless certain conditions are present.  Measles, mumps, and rubella (MMR) vaccine. Adults born before 1957 generally are considered immune to measles and mumps. Adults born in 1957 or later should have 1 or more doses of MMR vaccine unless there is a contraindication to the vaccine or there is laboratory evidence of immunity to each of the three diseases. A routine second dose of MMR vaccine should be obtained at least 28 days after the first dose for students attending postsecondary schools, health care workers, or international travelers. People who received inactivated measles vaccine or an unknown type of measles vaccine during 1963-1967 should receive 2 doses of MMR vaccine. People who received inactivated mumps vaccine or an unknown type of mumps vaccine before 1979 and are at high risk for mumps infection should consider immunization with 2 doses of MMR vaccine. For females of childbearing age, rubella immunity should be determined. If there is no evidence of immunity, females who are not pregnant should be vaccinated. If there is no evidence of immunity, females who are pregnant should delay immunization until after pregnancy. Unvaccinated health care workers born before 1957 who lack laboratory  evidence of measles, mumps, or rubella immunity or laboratory confirmation of disease should consider measles and mumps immunization with 2 doses of MMR vaccine or rubella immunization with 1 dose of MMR vaccine.  Pneumococcal 13-valent conjugate (PCV13) vaccine. When indicated, a person who is uncertain of her immunization history and has no record of immunization should receive the PCV13 vaccine. An adult aged 19 years or older who has certain medical conditions and has not been previously immunized should receive 1 dose of PCV13 vaccine. This PCV13 should be followed with a dose of pneumococcal polysaccharide (PPSV23) vaccine. The PPSV23 vaccine dose should be obtained at least 8 weeks after the dose of PCV13 vaccine. An adult aged 19 years or older who has certain medical conditions and previously received 1 or more doses of PPSV23 vaccine should receive 1 dose of PCV13. The PCV13 vaccine dose should be obtained 1 or more years after the last PPSV23 vaccine dose.    Pneumococcal polysaccharide (PPSV23) vaccine. When PCV13 is also indicated, PCV13 should be obtained first. All adults aged 65 years and older should be immunized. An adult younger than age 65 years who has certain medical conditions should be immunized. Any person who resides in a nursing home or long-term care facility should be immunized. An adult smoker should be immunized. People with an immunocompromised condition and certain other conditions should receive both PCV13 and PPSV23 vaccines. People with human immunodeficiency virus (HIV) infection should be immunized as soon as possible after diagnosis. Immunization during chemotherapy or radiation therapy should be avoided. Routine use of PPSV23 vaccine is not recommended for American Indians, [place] Natives, or people younger than 65 years unless there are medical conditions that require PPSV23 vaccine. When indicated, people who have unknown immunization and have no record of immunization  should receive PPSV23 vaccine. One-time revaccination 5 years after the first dose of PPSV23 is recommended for people aged 19-64 years who have   chronic kidney failure, nephrotic syndrome, asplenia, or immunocompromised conditions. People who received 1-2 doses of PPSV23 before age 25 years should receive another dose of PPSV23 vaccine at age 49 years or later if at least 5 years have passed since the previous dose. Doses of PPSV23 are not needed for people immunized with PPSV23 at or after age 44 years.  Preventive Services / Frequency   Ages 69 to 14 years  Blood pressure check.  Lipid and cholesterol check.  Lung cancer screening. / Every year if you are aged 80-80 years and have a 30-pack-year history of smoking and currently smoke or have quit within the past 15 years. Yearly screening is stopped once you have quit smoking for at least 15 years or develop a health problem that would prevent you from having lung cancer treatment.  Clinical breast exam.** / Every year after age 58 years.  BRCA-related cancer risk assessment.** / For women who have family members with a BRCA-related cancer (breast, ovarian, tubal, or peritoneal cancers).  Mammogram.** / Every year beginning at age 64 years and continuing for as long as you are in good health. Consult with your health care provider.  Pap test.** / Every 3 years starting at age 7 years through age 67 or 40 years with a history of 3 consecutive normal Pap tests.  HPV screening.** / Every 3 years from ages 76 years through ages 72 to 31 years with a history of 3 consecutive normal Pap tests.  Fecal occult blood test (FOBT) of stool. / Every year beginning at age 92 years and continuing until age 73 years. You Shakeda not need to do this test if you get a colonoscopy every 10 years.  Flexible sigmoidoscopy or colonoscopy.** / Every 5 years for a flexible sigmoidoscopy or every 10 years for a colonoscopy beginning at age 91 years and continuing  until age 14 years.  Hepatitis C blood test.** / For all people born from [birth year] through 1965 and any individual with known risks for hepatitis C.  Skin self-exam. / Monthly.  Influenza vaccine. / Every year.  Tetanus, diphtheria, and acellular pertussis (Tdap/Td) vaccine.** / Consult your health care provider. Pregnant women should receive 1 dose of Tdap vaccine during each pregnancy. 1 dose of Td every 10 years.  Varicella vaccine.** / Consult your health care provider. Pregnant females who do not have evidence of immunity should receive the first dose after pregnancy.  Zoster vaccine.** / 1 dose for adults aged 39 years or older.  Pneumococcal 13-valent conjugate (PCV13) vaccine.** / Consult your health care provider.  Pneumococcal polysaccharide (PPSV23) vaccine.** / 1 to 2 doses if you smoke cigarettes or if you have certain conditions.  Meningococcal vaccine.** / Consult your health care provider.  Hepatitis A vaccine.** / Consult your health care provider.  Hepatitis B vaccine.** / Consult your health care provider. Screening for abdominal aortic aneurysm (AAA)  by ultrasound is recommended for people over 50 who have history of high blood pressure or who are current or former smokers.  armour thyroid  is not recommended to use by the [employer]    Most doctors greatly prefer synthetic thyroid hormones for a variety of reasons, mainly because: .Animal thyroid pills aren't purified, which means that they contain substances that aren't naturally found in humans. Researchers have yet to determine how these substances affect the human body. Synthetic pills, on the other hand, are a pure form of the hormones produced in your body. While they are  called "synthetic," these medications are actually identical to the compound (T4) that the body produces naturally. .The amounts of T4 and T3 in animal thyroid pills vary among manufacturers and brands, so they're not as  precise as their synthetic counterparts. This can make it even harder to find your ideal hormone dosage. Also, the preparations from any single manufacturer over time will also vary. For instance, a perfect dose one month Lilian be too much or too little a couple months down the road when the next batch comes on the market. .The hormone balances in animals aren't the same as humans, so it's difficult to argue that animal thyroid hormones are truly natural for humans.  For these reasons, the great majority of physicians believe synthetic thyroid hormones are the safer treatment option. Animal thyroid hormones, however, can still help manage hypothyroidism for patients who prefer that alternative knowing the disadvantages outlined above.

## 2015-03-14 LAB — URINALYSIS, ROUTINE W REFLEX MICROSCOPIC
BILIRUBIN URINE: NEGATIVE
Glucose, UA: NEGATIVE
Hgb urine dipstick: NEGATIVE
KETONES UR: NEGATIVE
Leukocytes, UA: NEGATIVE
Nitrite: NEGATIVE
PH: 7 (ref 5.0–8.0)
Protein, ur: NEGATIVE
SPECIFIC GRAVITY, URINE: 1.005 (ref 1.001–1.035)

## 2015-03-14 LAB — INSULIN, RANDOM: Insulin: 16.1 u[IU]/mL (ref 2.0–19.6)

## 2015-03-14 LAB — VITAMIN D 25 HYDROXY (VIT D DEFICIENCY, FRACTURES): VIT D 25 HYDROXY: 54 ng/mL (ref 30–100)

## 2015-03-17 ENCOUNTER — Encounter: Payer: Self-pay | Admitting: Internal Medicine

## 2015-03-17 LAB — TB SKIN TEST
INDURATION: 0 mm
TB Skin Test: NEGATIVE

## 2015-04-05 ENCOUNTER — Ambulatory Visit (INDEPENDENT_AMBULATORY_CARE_PROVIDER_SITE_OTHER): Payer: BLUE CROSS/BLUE SHIELD | Admitting: Sports Medicine

## 2015-04-05 ENCOUNTER — Ambulatory Visit
Admission: RE | Admit: 2015-04-05 | Discharge: 2015-04-05 | Disposition: A | Discharge status: Home / Self Care | Payer: BLUE CROSS/BLUE SHIELD | Source: Ambulatory Visit | Attending: Sports Medicine | Admitting: Sports Medicine

## 2015-04-05 ENCOUNTER — Other Ambulatory Visit: Payer: Self-pay | Admitting: Sports Medicine

## 2015-04-05 VITALS — BP 100/60 | Ht 66.0 in | Wt 180.0 lb

## 2015-04-05 DIAGNOSIS — M25561 Pain in right knee: Secondary | ICD-10-CM

## 2015-04-05 DIAGNOSIS — S99922D Unspecified injury of left foot, subsequent encounter: Secondary | ICD-10-CM | POA: Diagnosis not present

## 2015-04-05 NOTE — Progress Notes (Signed)
   Subjective:    Patient ID: Shannon [name], female    DOB: 07-Jan-1951, 64 y.o.   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 She injured the left fifth toe back in April and since then has had intermittent pain at the DIP joint. She has a hammertoe and notices discomfort with trying to passively extend it. X-rays done by her chiropractor 6 weeks after the injury were negative for fracture per her report. She is simply wanting to know if her symptoms will get better. She does not notice any swelling. No pain elsewhere in the foot. No pain at rest. Pain is most noticeable with wearing certain shoes.  She is also complaining of intermittent right knee pain. Pain is around the anterior medial portion of the knee. No swelling. Is been present intermittently for the past 12 years. She notices it primarily with going upstairs and at times will notice it while riding her bike. She has a long bike ride planned along the [facility] for next fall.    Review of Systems     Objective:   Physical Exam  Well-developed, well-nourished. No acute distress  Left foot: Examination of the left foot with attention to the left fifth toe shows an obvious hammertoe deformity. Mild tenderness to palpation at the dorsum of the DIP joint. No other bony or soft tissue tenderness to direct palpation. No clinical angulation or malrotation  Right knee: Full range of motion. No effusion. 3+ patellofemoral crepitus. Slight tenderness along the medial joint line but negative McMurray's. Good joint stability. Neurovascularly intact distally.  Ultrasound of the left fifth toe shows degenerative changes at the DIP joint but no obvious signs of fracture. Extensor tendon is intact.      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 I will go ahead and repeat a plain x-ray just to make sure that there is no obvious bony abnormality. I also want to get x-rays of her right knee specifically to evaluate the degree of patellofemoral osteoarthritis present. I've given her a comprehensive home exercise program with quad and hamstring strengthening exercises. Phone follow-up with the x-ray results once available.    Addendum: X-rays reviewed with the patient. She does have evidence of an old fracture at the base of the fifth middle phalanx as well as degenerative changes of the interphalangeal joints. She also has some mild patellofemoral DJD but the remainder of her knee looks good. Follow-up with me as needed.

## 2015-05-08 ENCOUNTER — Other Ambulatory Visit: Payer: Self-pay | Admitting: *Deleted

## 2015-05-08 DIAGNOSIS — Z1212 Encounter for screening for malignant neoplasm of rectum: Secondary | ICD-10-CM

## 2015-05-08 LAB — POC HEMOCCULT BLD/STL (HOME/3-CARD/SCREEN)
Card #2 Fecal Occult Blod, POC: NEGATIVE
Card #3 Fecal Occult Blood, POC: NEGATIVE
FECAL OCCULT BLD: NEGATIVE

## 2015-07-06 ENCOUNTER — Encounter: Payer: Self-pay | Admitting: Gastroenterology

## 2015-08-15 ENCOUNTER — Ambulatory Visit (INDEPENDENT_AMBULATORY_CARE_PROVIDER_SITE_OTHER): Payer: BLUE CROSS/BLUE SHIELD | Admitting: Physician Assistant

## 2015-08-15 ENCOUNTER — Encounter: Payer: Self-pay | Admitting: Physician Assistant

## 2015-08-15 VITALS — Ht 66.0 in

## 2015-08-15 DIAGNOSIS — R059 Cough, unspecified: Secondary | ICD-10-CM

## 2015-08-15 DIAGNOSIS — R05 Cough: Secondary | ICD-10-CM | POA: Diagnosis not present

## 2015-08-15 MED ORDER — DEXAMETHASONE SODIUM PHOSPHATE 100 MG/10ML IJ SOLN
10.0000 mg | Freq: Once | INTRAMUSCULAR | Status: AC
  Administered 2015-08-15: 10 mg via INTRAMUSCULAR

## 2015-08-15 MED ORDER — AZITHROMYCIN 250 MG PO TABS: ORAL_TABLET | ORAL | Status: AC

## 2015-08-15 MED ORDER — ALBUTEROL SULFATE HFA 108 (90 BASE) MCG/ACT IN AERS: 2.0000 | INHALATION_SPRAY | Freq: Four times a day (QID) | RESPIRATORY_TRACT | Status: DC | PRN

## 2015-08-15 MED ORDER — IPRATROPIUM-ALBUTEROL 0.5-2.5 (3) MG/3ML IN SOLN
3.0000 mL | Freq: Once | RESPIRATORY_TRACT | Status: AC
  Administered 2015-08-15: 3 mL via RESPIRATORY_TRACT

## 2015-08-15 NOTE — Progress Notes (Signed)
Subjective:    Patient ID: Shannon [name], female    DOB: 1951-03-06, 65 y.o.   MRN: [id number]  HPI 65 y.o. WF presents with fatigue/lower grade temp x 1 week. Feels fatigued, body aches, sore throat, ear aches, low grade temperature, cough, sinus congestion, hoarseness. Tylenol, cough drops, sinus immune booster from whole foods, some yellow mucus, some chest tightness. Felt like she was getting better but then felt worse this past afternoon.  Going to go hiking in [place] next week.   Height 5\' 6"  (1.676 m), last menstrual period 03/29/2004.  Past Medical History  Diagnosis Date  . Anxiety   . Cancer ([place])     melanoma- lip 2011  . Depression   . ADD (attention deficit disorder with hyperactivity)   . Diverticulosis   . Asthma   . Hypothyroidism   . Osteopenia   . Rosacea   . Menopause    Current Outpatient Prescriptions on File Prior to Visit  Medication Sig Dispense Refill  . Cholecalciferol (VITAMIN D) 2000 UNITS CAPS Take by mouth 2 (two) times daily.     [name] escitalopram (LEXAPRO) 5 MG tablet Take 5 mg by mouth.    [name] GAMMA AMINOBUTYRIC ACID PO Take 200 mg by mouth 2 (two) times daily.    [name] LEVOXYL 50 MCG tablet Takes 50 mcg on M,W,F and 100 mcg the other 4 days of the week.    [name] LORazepam (ATIVAN) 2 MG tablet Take 1 tablet (2 mg total) by mouth every 8 (eight) hours as needed for anxiety. 90 tablet 0  . MAGNESIUM GLYCINATE PLUS PO Take 100 mg by mouth 2 (two) times daily.    . meloxicam (MOBIC) 15 MG tablet Take 15 mg by mouth daily. PRN    . Omega-3 1000 MG CAPS Take 1 g by mouth.    . Omega-3 Fatty Acids (FISH OIL PO) Take 1,200 mg by mouth daily.    [name] OVER THE COUNTER MEDICATION Influenzinum 30x   4 pellets 1 time a week.    . Probiotic Product (PROBIOTIC PO) Take 1 capsule by mouth daily.      [name] thyroid (ARMOUR THYROID) 15 MG tablet Take by mouth. 7.5 mg qd    . Cyanocobalamin (VITAMIN B 12 PO) Take 1,000 mcg by mouth daily. Reported on 08/15/2015     No current  facility-administered medications on file prior to visit.    Review of Systems  Constitutional: Negative for chills and diaphoresis.  HENT: Positive for congestion, postnasal drip, sinus pressure and sneezing. Negative for ear pain and sore throat.   Respiratory: Positive for cough and chest tightness. Negative for shortness of breath and wheezing.   Cardiovascular: Negative.   Gastrointestinal: Negative.   Genitourinary: Negative.   Musculoskeletal: Negative for neck pain.  Neurological: Negative for headaches.       Objective:   Physical Exam  Constitutional: She is oriented to person, place, and time. She appears well-developed and well-nourished.  HENT:  Head: Normocephalic and atraumatic.  Right Ear: External ear normal.  Left Ear: External ear normal.  Nose: Nose normal.  Mouth/Throat: Oropharynx is clear and moist.  Eyes: Conjunctivae are normal. Pupils are equal, round, and reactive to light.  Neck: Normal range of motion. Neck supple.  Cardiovascular: Normal rate and regular rhythm.   Pulmonary/Chest: Effort normal. No respiratory distress. She has wheezes. She has no rales. She exhibits no tenderness.  Abdominal: Soft. Bowel sounds are normal.  Lymphadenopathy:    She has  no cervical adenopathy.  Neurological: She is alert and oriented to person, place, and time.  Skin: Skin is warm and dry.        Assessment & Plan:  1. Cough - azithromycin (ZITHROMAX) 250 MG tablet; Take 2 tablets (500 mg) on  Day 1,  followed by 1 tablet (250 mg) once daily on Days 2 through 5.  Dispense: 6 each; Refill: 1 - dexamethasone (DECADRON) injection 10 mg; Inject 1 mL (10 mg total) into the muscle once. - ipratropium-albuterol (DUONEB) 0.5-2.5 (3) MG/3ML nebulizer solution 3 mL; Take 3 mLs by nebulization once. - albuterol (PROVENTIL HFA;VENTOLIN HFA) 108 (90 Base) MCG/ACT inhaler; Inhale 2 puffs into the lungs every 6 (six) hours as needed for wheezing or shortness of breath.   Dispense: 1 Inhaler; Refill: 2

## 2015-08-15 NOTE — Patient Instructions (Signed)
Please pick one of the over the counter allergy medications below and take it once daily for allergies.  Claritin or loratadine cheapest but likely the weakest  Zyrtec or certizine at night because it can make you sleepy The strongest is allegra or fexafinadine  Cheapest at [facility], [employer], [employer]  HOW TO TREAT VIRAL COUGH AND COLD SYMPTOMS:  -Symptoms usually last at least 1 week with the worst symptoms being around day 4.  - colds usually start with a sore throat and end with a cough, and the cough can take 2 weeks to get better.  -No antibiotics are needed for colds, flu, sore throats, cough, bronchitis UNLESS symptoms are longer than 7 days OR if you are getting better then get drastically worse.  -There are a lot of combination medications (Dayquil, Nyquil, Vicks 44, tyelnol cold and sinus, ETC). Please look at the ingredients on the back so that you are treating the correct symptoms and not doubling up on medications/ingredients.    Medicines you can use  Nasal congestion  - pseudoephedrine (Sudafed)- behind the counter, do not use if you have high blood pressure, medicine that have -D in them.  - phenylephrine (Sudafed PE) -Dextormethorphan + chlorpheniramine (Coridcidin HBP)- okay if you have high blood pressure -Oxymetazoline (Afrin) nasal spray- LIMIT to 3 days -Saline nasal spray -Neti pot (used distilled or bottled water)  Ear pain/congestion  -pseudoephedrine (sudafed) - Nasonex/flonase nasal spray  Fever  -Acetaminophen (Tyelnol) -Ibuprofen (Advil, motrin, aleve)  Sore Throat  -Acetaminophen (Tyelnol) -Ibuprofen (Advil, motrin, aleve) -Drink a lot of water -Gargle with salt water - Rest your voice (don't talk) -Throat sprays -Cough drops  Body Aches  -Acetaminophen (Tyelnol) -Ibuprofen (Advil, motrin, aleve)  Headache  -Acetaminophen (Tyelnol) -Ibuprofen (Advil, motrin, aleve) - Exedrin, Exedrin Migraine  Allergy symptoms (cough, sneeze, runny nose, itchy  eyes) -Claritin or loratadine cheapest but likely the weakest  -Zyrtec or certizine at night because it can make you sleepy -The strongest is allegra or fexafinadine  Cheapest at [employer], [employer], [employer]  Cough  -Dextromethorphan (Delsym)- medicine that has DM in it -Guafenesin (Mucinex/Robitussin) - cough drops - drink lots of water  Chest Congestion  -Guafenesin (Mucinex/Robitussin)  Red Itchy Eyes  - Naphcon-A  Upset Stomach  - Bland diet (nothing spicy, greasy, fried, and high acid foods like tomatoes, oranges, berries) -OKAY- cereal, bread, soup, crackers, rice -Eat smaller more frequent meals -reduce caffeine, no alcohol -Loperamide (Imodium-AD) if diarrhea -Prevacid for heart burn  General health when sick  -Hydration -wash your hands frequently -keep surfaces clean -change pillow cases and sheets often -Get fresh air but do not exercise strenuously -Vitamin D, double up on it - Vitamin C -Zinc       

## 2015-08-17 ENCOUNTER — Other Ambulatory Visit: Payer: Self-pay | Admitting: Internal Medicine

## 2015-08-19 ENCOUNTER — Encounter: Payer: Self-pay | Admitting: *Deleted

## 2015-09-07 ENCOUNTER — Telehealth: Payer: Self-pay | Admitting: *Deleted

## 2015-09-07 MED ORDER — FLUCONAZOLE 150 MG PO TABS: ORAL_TABLET | ORAL | Status: DC

## 2015-09-07 NOTE — Telephone Encounter (Signed)
Patient called and states she needs an RX for yeast infection after taking antibiotics. Per Dr Melford Aase, send in an RX for [place].

## 2015-09-13 DIAGNOSIS — B070 Plantar wart: Secondary | ICD-10-CM | POA: Diagnosis not present

## 2015-09-13 DIAGNOSIS — D230 Other benign neoplasm of skin of lip: Secondary | ICD-10-CM | POA: Diagnosis not present

## 2015-09-13 DIAGNOSIS — I781 Nevus, non-neoplastic: Secondary | ICD-10-CM | POA: Diagnosis not present

## 2015-09-28 DIAGNOSIS — M542 Cervicalgia: Secondary | ICD-10-CM | POA: Diagnosis not present

## 2015-09-28 DIAGNOSIS — M9901 Segmental and somatic dysfunction of cervical region: Secondary | ICD-10-CM | POA: Diagnosis not present

## 2015-09-28 DIAGNOSIS — M9903 Segmental and somatic dysfunction of lumbar region: Secondary | ICD-10-CM | POA: Diagnosis not present

## 2015-09-28 DIAGNOSIS — M545 Low back pain: Secondary | ICD-10-CM | POA: Diagnosis not present

## 2015-10-02 DIAGNOSIS — M9901 Segmental and somatic dysfunction of cervical region: Secondary | ICD-10-CM | POA: Diagnosis not present

## 2015-10-02 DIAGNOSIS — M542 Cervicalgia: Secondary | ICD-10-CM | POA: Diagnosis not present

## 2015-10-02 DIAGNOSIS — M545 Low back pain: Secondary | ICD-10-CM | POA: Diagnosis not present

## 2015-10-02 DIAGNOSIS — M9904 Segmental and somatic dysfunction of sacral region: Secondary | ICD-10-CM | POA: Diagnosis not present

## 2015-10-06 DIAGNOSIS — M545 Low back pain: Secondary | ICD-10-CM | POA: Diagnosis not present

## 2015-10-06 DIAGNOSIS — M9904 Segmental and somatic dysfunction of sacral region: Secondary | ICD-10-CM | POA: Diagnosis not present

## 2015-10-06 DIAGNOSIS — M9901 Segmental and somatic dysfunction of cervical region: Secondary | ICD-10-CM | POA: Diagnosis not present

## 2015-10-06 DIAGNOSIS — M542 Cervicalgia: Secondary | ICD-10-CM | POA: Diagnosis not present

## 2015-10-16 DIAGNOSIS — M9903 Segmental and somatic dysfunction of lumbar region: Secondary | ICD-10-CM | POA: Diagnosis not present

## 2015-10-16 DIAGNOSIS — M542 Cervicalgia: Secondary | ICD-10-CM | POA: Diagnosis not present

## 2015-10-16 DIAGNOSIS — M545 Low back pain: Secondary | ICD-10-CM | POA: Diagnosis not present

## 2015-10-16 DIAGNOSIS — M9901 Segmental and somatic dysfunction of cervical region: Secondary | ICD-10-CM | POA: Diagnosis not present

## 2015-10-17 ENCOUNTER — Telehealth: Payer: Self-pay | Admitting: Internal Medicine

## 2015-10-17 NOTE — Telephone Encounter (Signed)
Shannon [name] [id number] patient of yours referred Shannon [name] to establish care with Dr. Charlett Blake. Please advise

## 2015-10-18 NOTE — Telephone Encounter (Signed)
OK to schedule new patient appointment for this patient.

## 2015-10-19 NOTE — Telephone Encounter (Signed)
LVM advising patient of message below °

## 2015-10-20 NOTE — Telephone Encounter (Signed)
Patient scheduled for 03/11/2016 at 2:30pm

## 2015-10-23 DIAGNOSIS — M5412 Radiculopathy, cervical region: Secondary | ICD-10-CM | POA: Diagnosis not present

## 2015-10-23 DIAGNOSIS — M9901 Segmental and somatic dysfunction of cervical region: Secondary | ICD-10-CM | POA: Diagnosis not present

## 2015-10-30 DIAGNOSIS — M542 Cervicalgia: Secondary | ICD-10-CM | POA: Diagnosis not present

## 2015-10-30 DIAGNOSIS — M9903 Segmental and somatic dysfunction of lumbar region: Secondary | ICD-10-CM | POA: Diagnosis not present

## 2015-10-30 DIAGNOSIS — M9901 Segmental and somatic dysfunction of cervical region: Secondary | ICD-10-CM | POA: Diagnosis not present

## 2015-10-30 DIAGNOSIS — M545 Low back pain: Secondary | ICD-10-CM | POA: Diagnosis not present

## 2015-11-06 DIAGNOSIS — M9901 Segmental and somatic dysfunction of cervical region: Secondary | ICD-10-CM | POA: Diagnosis not present

## 2015-11-06 DIAGNOSIS — M5412 Radiculopathy, cervical region: Secondary | ICD-10-CM | POA: Diagnosis not present

## 2015-11-24 DIAGNOSIS — E039 Hypothyroidism, unspecified: Secondary | ICD-10-CM | POA: Diagnosis not present

## 2015-12-04 DIAGNOSIS — M545 Low back pain: Secondary | ICD-10-CM | POA: Diagnosis not present

## 2015-12-04 DIAGNOSIS — M9901 Segmental and somatic dysfunction of cervical region: Secondary | ICD-10-CM | POA: Diagnosis not present

## 2015-12-04 DIAGNOSIS — M9904 Segmental and somatic dysfunction of sacral region: Secondary | ICD-10-CM | POA: Diagnosis not present

## 2015-12-04 DIAGNOSIS — M542 Cervicalgia: Secondary | ICD-10-CM | POA: Diagnosis not present

## 2015-12-07 ENCOUNTER — Encounter: Payer: Self-pay | Admitting: Gastroenterology

## 2015-12-18 DIAGNOSIS — M9901 Segmental and somatic dysfunction of cervical region: Secondary | ICD-10-CM | POA: Diagnosis not present

## 2015-12-18 DIAGNOSIS — M5412 Radiculopathy, cervical region: Secondary | ICD-10-CM | POA: Diagnosis not present

## 2016-01-03 DIAGNOSIS — E039 Hypothyroidism, unspecified: Secondary | ICD-10-CM | POA: Diagnosis not present

## 2016-01-07 DIAGNOSIS — Z23 Encounter for immunization: Secondary | ICD-10-CM | POA: Diagnosis not present

## 2016-02-22 ENCOUNTER — Ambulatory Visit (INDEPENDENT_AMBULATORY_CARE_PROVIDER_SITE_OTHER): Payer: Medicare Other | Admitting: Sports Medicine

## 2016-02-22 ENCOUNTER — Encounter: Payer: Self-pay | Admitting: Sports Medicine

## 2016-02-22 ENCOUNTER — Other Ambulatory Visit: Payer: Self-pay | Admitting: *Deleted

## 2016-02-22 ENCOUNTER — Ambulatory Visit (AMBULATORY_SURGERY_CENTER): Payer: Self-pay

## 2016-02-22 VITALS — Ht 65.25 in | Wt 174.0 lb

## 2016-02-22 VITALS — BP 102/71 | HR 50 | Ht 65.0 in | Wt 170.0 lb

## 2016-02-22 DIAGNOSIS — S92352A Displaced fracture of fifth metatarsal bone, left foot, initial encounter for closed fracture: Secondary | ICD-10-CM | POA: Diagnosis not present

## 2016-02-22 DIAGNOSIS — S92353A Displaced fracture of fifth metatarsal bone, unspecified foot, initial encounter for closed fracture: Secondary | ICD-10-CM | POA: Diagnosis not present

## 2016-02-22 DIAGNOSIS — Z800 Family history of malignant neoplasm of digestive organs: Secondary | ICD-10-CM

## 2016-02-22 MED ORDER — SUPREP BOWEL PREP KIT 17.5-3.13-1.6 GM/177ML PO SOLN: 1.0000 | Freq: Once | ORAL | 0 refills | Status: AC

## 2016-02-22 NOTE — Progress Notes (Signed)
No allergies to eggs or soy No past problems with anesthesia EXCEPT felt she was rushed out the door too quickly.  She lives alone. No diet meds No home oxygen  Declined emmi

## 2016-02-22 NOTE — Progress Notes (Signed)
  Shannon [name] - 65 y.o. female MRN [id number]  Date of birth: 1950/09/17  SUBJECTIVE:  Including CC & ROS.  CC: Left foot pain Presents with left foot pain that is been ongoing for 2 weeks after she rolled it while hiking. She was biking on the [facility] and on her day off rolled her left ankle. She had a lot of pain actually in the lateral aspect of her foot and it improved over the next few days. It was better when she wore her biking shoes.  It is worse when she walks on it for long periods of time. Denies any pain along the ankle. She reports that she had bruising along her ankle and along her second and third toes. She reports some pain at those areas. Denies any numbness or tingling.   ROS: No unexpected weight loss, fever, chills, swelling, instability, muscle pain, numbness/tingling, redness, otherwise see HPI   PMHx - Updated and reviewed.  Contributory factors include: Hypothyroid, vitamin D deficiency PSHx - Updated and reviewed.  Contributory factors include:  Negative FHx - Updated and reviewed.  Contributory factors include:  Negative Social Hx - Updated and reviewed. Contributory factors include: Enjoys biking Medications - reviewed   DATA REVIEWED: None  PHYSICAL EXAM:  VS: BP:102/71  HR:(!) 50bpm  TEMP: ( )  RESP:   HT:5\' 5"  (165.1 cm)   WT:170 lb (77.1 kg)  BMI:28.3 PHYSICAL EXAM: Gen: NAD, alert, cooperative with exam, well-appearing HEENT: clear conjunctiva,  CV:  no edema, capillary refill brisk, normal rate Resp: non-labored Skin: no rashes, normal turgor  Neuro: no gross deficits.  Psych:  alert and oriented  Ankle & Foot: Has some ecchymosis distal to the ankle on left. She had mild ecchymosis also along the second and third metatarsals. There was no erythema, ulcers, calluses, blister Arch: pes cavus   Achilles tendon without nodules or tenderness No swelling of retrocalcaneal bursa Mild pain at MT heads Mild pain at base of 5th MT; No tenderness over  cuboid; No tenderness over N spot or navicular prominence No tenderness on lateral and medial malleolus No sign of peroneal tendon subluxations or tenderness to palpation Full in plantarflexion, dorsiflexion, inversion, and eversion of the foot; flexion and extension of the toes Strength: 5/5 in all directions. Sensation: intact Vascular: intact w/ dorsalis pedis & posterior tibialis pulses 2+ Stable lateral and medial ligaments; Negative Anterior drawer test  Ultrasound: Limited ultrasound of her left foot reveals swelling and a small bone chip off of her base of her fifth metatarsal. No other fractures or swelling seen along the fifth metatarsal. No evidence of Jones fracture. Second and third metatarsal is with soft tissue swelling but no bony swelling or fractures present. Findings consistent with fifth metatarsal avulsion fracture and no evidence of a Jones fracture.  ASSESSMENT & PLAN:   Closed fracture of base of fifth metatarsal bone She can perform activities as tolerated. Biking is encouraged for cross-country training. We recommend wearing shoes with support. Recommend using orthotics as needed. Arch strap given for compression and support. Patient felt immediate relief.

## 2016-02-22 NOTE — Assessment & Plan Note (Addendum)
She can perform activities as tolerated. Biking is encouraged for cross-country training. We recommend wearing shoes with support. Recommend using orthotics as needed. Arch strap given for compression and support. Patient felt immediate relief.

## 2016-02-22 NOTE — Patient Instructions (Signed)
5th metatarsal avulsion fracture: do activities as tolerated.  Wear arch strap when doing activities where walking a lot.

## 2016-02-29 DIAGNOSIS — L821 Other seborrheic keratosis: Secondary | ICD-10-CM | POA: Diagnosis not present

## 2016-02-29 DIAGNOSIS — B079 Viral wart, unspecified: Secondary | ICD-10-CM | POA: Diagnosis not present

## 2016-02-29 DIAGNOSIS — Z23 Encounter for immunization: Secondary | ICD-10-CM | POA: Diagnosis not present

## 2016-03-06 DIAGNOSIS — H43393 Other vitreous opacities, bilateral: Secondary | ICD-10-CM | POA: Diagnosis not present

## 2016-03-06 DIAGNOSIS — H25813 Combined forms of age-related cataract, bilateral: Secondary | ICD-10-CM | POA: Diagnosis not present

## 2016-03-06 DIAGNOSIS — H52203 Unspecified astigmatism, bilateral: Secondary | ICD-10-CM | POA: Diagnosis not present

## 2016-03-06 DIAGNOSIS — H524 Presbyopia: Secondary | ICD-10-CM | POA: Diagnosis not present

## 2016-03-06 DIAGNOSIS — H02831 Dermatochalasis of right upper eyelid: Secondary | ICD-10-CM | POA: Diagnosis not present

## 2016-03-06 DIAGNOSIS — H5213 Myopia, bilateral: Secondary | ICD-10-CM | POA: Diagnosis not present

## 2016-03-06 DIAGNOSIS — H02834 Dermatochalasis of left upper eyelid: Secondary | ICD-10-CM | POA: Diagnosis not present

## 2016-03-07 ENCOUNTER — Encounter: Payer: Self-pay | Admitting: Gastroenterology

## 2016-03-07 ENCOUNTER — Ambulatory Visit (AMBULATORY_SURGERY_CENTER): Payer: Medicare Other | Admitting: Gastroenterology

## 2016-03-07 VITALS — BP 110/57 | HR 50 | Temp 97.5°F | Resp 18 | Ht 65.0 in | Wt 174.0 lb

## 2016-03-07 DIAGNOSIS — Z800 Family history of malignant neoplasm of digestive organs: Secondary | ICD-10-CM

## 2016-03-07 DIAGNOSIS — Z8371 Family history of colonic polyps: Secondary | ICD-10-CM

## 2016-03-07 DIAGNOSIS — Z1211 Encounter for screening for malignant neoplasm of colon: Secondary | ICD-10-CM | POA: Diagnosis not present

## 2016-03-07 DIAGNOSIS — E079 Disorder of thyroid, unspecified: Secondary | ICD-10-CM | POA: Diagnosis not present

## 2016-03-07 MED ORDER — SODIUM CHLORIDE 0.9 % IV SOLN: 500.0000 mL | INTRAVENOUS | Status: DC

## 2016-03-07 NOTE — Op Note (Signed)
[facility] Patient Name: Shannon [name] Procedure Date: 03/07/2016 9:41 AM MRN: [id number] Endoscopist: Ladene Artist , MD Age: 65 Referring MD:  Date of Birth: 04-Nov-1950 Gender: Female Account #: [id number] Procedure:                Colonoscopy Indications:              Screening for colon cancer: Family history of                            colorectal cancer in distant relative, Colon cancer                            screening in patient at increased risk: Family                            history of 1st-degree relative with colon polyps Medicines:                Monitored Anesthesia Care Procedure:                Pre-Anesthesia Assessment:                           - Prior to the procedure, a History and Physical                            was performed, and patient medications and                            allergies were reviewed. The patient's tolerance of                            previous anesthesia was also reviewed. The risks                            and benefits of the procedure and the sedation                            options and risks were discussed with the patient.                            All questions were answered, and informed consent                            was obtained. Prior Anticoagulants: The patient has                            taken no previous anticoagulant or antiplatelet                            agents. ASA Grade Assessment: II - A patient with                            mild systemic disease. After reviewing the risks  and benefits, the patient was deemed in                            satisfactory condition to undergo the procedure.                           After obtaining informed consent, the colonoscope                            was passed under direct vision. Throughout the                            procedure, the patient's blood pressure, pulse, and                            oxygen saturations  were monitored continuously. The                            Model PCF-H190DL [id number]) scope was introduced                            through the anus and advanced to the the cecum,                            identified by appendiceal orifice and ileocecal                            valve. The ileocecal valve, appendiceal orifice,                            and rectum were photographed. The quality of the                            bowel preparation was excellent. The colonoscopy                            was performed without difficulty. The patient                            tolerated the procedure well. Scope In: 9:54:35 AM Scope Out: 10:09:15 AM Scope Withdrawal Time: 0 hours 10 minutes 7 seconds  Total Procedure Duration: 0 hours 14 minutes 40 seconds  Findings:                 The perianal and digital rectal examinations were                            normal.                           A few small-mouthed diverticula were found in the                            sigmoid colon.  The exam was otherwise without abnormality on                            direct and retroflexion views. Complications:            No immediate complications. Estimated blood loss:                            None. Estimated Blood Loss:     Estimated blood loss: none. Impression:               - Mild sigmoid colon diverticulosis                           - The colon was otherwise normal on direct and                            retroflexion views.                           - No specimens collected. Recommendation:           - Given absence of polyps on todays and prior                            colonoscopies will plan for a repeat colonoscopy in                            10 years for screening purposes.                           - Patient has a contact number available for                            emergencies. The signs and symptoms of potential                             delayed complications were discussed with the                            patient. Return to normal activities tomorrow.                            Written discharge instructions were provided to the                            patient.                           - Resume previous diet.                           - Continue present medications. Ladene Artist, MD 03/07/2016 10:14:34 AM This report has been signed electronically.

## 2016-03-07 NOTE — Progress Notes (Signed)
Patient awakening,vss,report to rn 

## 2016-03-07 NOTE — Patient Instructions (Signed)
Impression/Recommendations:  Diverticulosis handout given to patient.  Repeat colonoscopy in 10 years for screening purposes.  YOU HAD AN ENDOSCOPIC PROCEDURE TODAY AT THE [facility]:   Refer to the procedure report that was given to you for any specific questions about what was found during the examination.  If the procedure report does not answer your questions, please call your gastroenterologist to clarify.  If you requested that your care partner not be given the details of your procedure findings, then the procedure report has been included in a sealed envelope for you to review at your convenience later.  YOU SHOULD EXPECT: Some feelings of bloating in the abdomen. Passage of more gas than usual.  Walking can help get rid of the air that was put into your GI tract during the procedure and reduce the bloating. If you had a lower endoscopy (such as a colonoscopy or flexible sigmoidoscopy) you Shannon [name] notice spotting of blood in your stool or on the toilet paper. If you underwent a bowel prep for your procedure, you Shannon [name] not have a normal bowel movement for a few days.  Please Note:  You might notice some irritation and congestion in your nose or some drainage.  This is from the oxygen used during your procedure.  There is no need for concern and it should clear up in a day or so.  SYMPTOMS TO REPORT IMMEDIATELY:   Following lower endoscopy (colonoscopy or flexible sigmoidoscopy):  Excessive amounts of blood in the stool  Significant tenderness or worsening of abdominal pains  Swelling of the abdomen that is new, acute  Fever of 100F or higher   For urgent or emergent issues, a gastroenterologist can be reached at any hour by calling [phone].   DIET:  We do recommend a small meal at first, but then you Shannon [name] proceed to your regular diet.  Drink plenty of fluids but you should avoid alcoholic beverages for 24 hours.  ACTIVITY:  You should plan to take it easy for the rest  of today and you should NOT DRIVE or use heavy machinery until tomorrow (because of the sedation medicines used during the test).    FOLLOW UP: Our staff will call the number listed on your records the next business day following your procedure to check on you and address any questions or concerns that you Shannon [name] have regarding the information given to you following your procedure. If we do not reach you, we will leave a message.  However, if you are feeling well and you are not experiencing any problems, there is no need to return our call.  We will assume that you have returned to your regular daily activities without incident.  If any biopsies were taken you will be contacted by phone or by letter within the next 1-3 weeks.  Please call us at [phone] if you have not heard about the biopsies in 3 weeks.    SIGNATURES/CONFIDENTIALITY: You and/or your care partner have signed paperwork which will be entered into your electronic medical record.  These signatures attest to the fact that that the information above on your After Visit Summary has been reviewed and is understood.  Full responsibility of the confidentiality of this discharge information lies with you and/or your care-partner. 

## 2016-03-08 ENCOUNTER — Telehealth: Payer: Self-pay | Admitting: *Deleted

## 2016-03-08 ENCOUNTER — Telehealth: Payer: Self-pay

## 2016-03-08 NOTE — Telephone Encounter (Signed)
Pre Visit call completed. 

## 2016-03-08 NOTE — Telephone Encounter (Signed)
  Follow up Call-  Call back number 03/07/2016  Post procedure Call Back phone  # [phone]  Permission to leave phone message Yes  Some recent data might be hidden     Patient questions:  Do you have a fever, pain , or abdominal swelling? No. Pain Score  0 *  Have you tolerated food without any problems? Yes.    Have you been able to return to your normal activities? yes  Do you have any questions about your discharge instructions: Diet   No. Medications  No. Follow up visit  No.  Do you have questions or concerns about your Care? No.  Actions: * If pain score is 4 or above: No action needed, pain <4.

## 2016-03-11 ENCOUNTER — Encounter: Payer: Self-pay | Admitting: Family Medicine

## 2016-03-11 ENCOUNTER — Ambulatory Visit (INDEPENDENT_AMBULATORY_CARE_PROVIDER_SITE_OTHER): Payer: Medicare Other | Admitting: Family Medicine

## 2016-03-11 VITALS — BP 98/72 | HR 52 | Temp 98.7°F | Ht 65.5 in | Wt 174.6 lb

## 2016-03-11 DIAGNOSIS — M25561 Pain in right knee: Secondary | ICD-10-CM

## 2016-03-11 DIAGNOSIS — E782 Mixed hyperlipidemia: Secondary | ICD-10-CM | POA: Diagnosis not present

## 2016-03-11 DIAGNOSIS — Z23 Encounter for immunization: Secondary | ICD-10-CM | POA: Diagnosis not present

## 2016-03-11 DIAGNOSIS — R739 Hyperglycemia, unspecified: Secondary | ICD-10-CM | POA: Diagnosis not present

## 2016-03-11 DIAGNOSIS — C439 Malignant melanoma of skin, unspecified: Secondary | ICD-10-CM

## 2016-03-11 DIAGNOSIS — T753XXA Motion sickness, initial encounter: Secondary | ICD-10-CM

## 2016-03-11 DIAGNOSIS — E559 Vitamin D deficiency, unspecified: Secondary | ICD-10-CM

## 2016-03-11 DIAGNOSIS — E079 Disorder of thyroid, unspecified: Secondary | ICD-10-CM

## 2016-03-11 DIAGNOSIS — E039 Hypothyroidism, unspecified: Secondary | ICD-10-CM

## 2016-03-11 DIAGNOSIS — K6289 Other specified diseases of anus and rectum: Secondary | ICD-10-CM | POA: Diagnosis not present

## 2016-03-11 DIAGNOSIS — Z7189 Other specified counseling: Secondary | ICD-10-CM

## 2016-03-11 DIAGNOSIS — Z7184 Encounter for health counseling related to travel: Secondary | ICD-10-CM

## 2016-03-11 DIAGNOSIS — F4323 Adjustment disorder with mixed anxiety and depressed mood: Secondary | ICD-10-CM

## 2016-03-11 HISTORY — DX: Mixed hyperlipidemia: E78.2

## 2016-03-11 MED ORDER — SCOPOLAMINE 1 MG/3DAYS TD PT72: 1.0000 | MEDICATED_PATCH | TRANSDERMAL | 0 refills | Status: DC

## 2016-03-11 MED ORDER — HYDROCORTISONE 2.5 % RE CREA: 1.0000 "application " | TOPICAL_CREAM | Freq: Two times a day (BID) | RECTAL | 0 refills | Status: DC

## 2016-03-11 NOTE — Progress Notes (Signed)
Patient ID: Shannon [name], female   DOB: 1951-03-06, 65 y.o.   MRN: [id number]   Subjective:    Patient ID: Shannon [name], female    DOB: [birth year], 65 y.o.   MRN: [id number]    HPI Patient is in today for new patient appointment. She has a PMH significant for thyroid disease, vitamin d deficiency, depression and anxiety. Also notes a recent left foot fracture. Also notes some trouble with rectal irritation bbut this has improved since stopping gluten a few months ago. She feels well today. No recent hospitalization or acute concerns. Denies CP/palp/SOB/HA/congestion/fevers/GI or GU c/o. Taking meds as prescribed  Past Medical History:  Diagnosis Date  . ADD (attention deficit disorder with hyperactivity)   . Anxiety   . Asthma   . Cancer ([place])    melanoma- lip 2011  . Depression   . Diverticulosis   . Hypothyroidism   . Menopause   . Osteopenia   . Rosacea     Past Surgical History:  Procedure Laterality Date  . blephoroplasty    . BROW LIFT    . DILATION AND CURETTAGE OF UTERUS    . TONSILLECTOMY AND ADENOIDECTOMY      Family History  Problem Relation Age of Onset  . Colon polyps Mother   . Parkinson's disease Mother   . Diabetes Father   . Emphysema Father   . Tuberculosis Father   . Colon cancer Maternal Grandfather   . Alcohol abuse Sister   . Cancer Sister     skin cancer, melanoma cell, basil cell  . ADD / ADHD Daughter   . Anxiety disorder Daughter   . Depression Daughter   . ADD / ADHD Son   . Anxiety disorder Son   . Depression Son   . Cancer Sister     Breast cancer  . Anxiety disorder Brother     Social History   Social History  . Marital status: Single    Spouse name: N/A  . Number of children: 2  . Years of education: N/A   Occupational History  . Retired Tourist information centre manager    Social History Main Topics  . Smoking status: Never Smoker  . Smokeless tobacco: Never Used  . Alcohol use 1.8 - 2.4 oz/week    1 - 2 Glasses of wine, 1 Cans of beer, 1 Shots  of liquor per week     Comment: rare  . Drug use: No  . Sexual activity: Not on file   Other Topics Concern  . Not on file   Social History Narrative  . No narrative on file    Outpatient Medications Prior to Visit  Medication Sig Dispense Refill  . Cholecalciferol (VITAMIN D) 2000 UNITS CAPS Take by mouth 2 (two) times daily.     [name] escitalopram (LEXAPRO) 5 MG tablet Take 5 mg by mouth.    [name] GAMMA AMINOBUTYRIC ACID PO Take 200 mg by mouth as needed. Up to bid    . LEVOXYL 50 MCG tablet Takes 50 mcg on M,W,F and 100 mcg the other 4 days of the week.    [name] LORazepam (ATIVAN) 2 MG tablet Take 1 tablet (2 mg total) by mouth every 8 (eight) hours as needed for anxiety. 90 tablet 0  . MAGNESIUM GLYCINATE PLUS PO Take 100 mg by mouth 2 (two) times daily.    . Omega-3 Fatty Acids (FISH OIL PO) Take 1,200 mg by mouth daily.    [name] OVER THE COUNTER MEDICATION Influenzinum 30x  4 pellets 1 time a week.    [name] OVER THE COUNTER MEDICATION thytrophin pmg daily    . Probiotic Product (PROBIOTIC PO) Take 1 capsule by mouth daily.      [name] thyroid (ARMOUR THYROID) 15 MG tablet Take by mouth. 7.5 mg qd     Facility-Administered Medications Prior to Visit  Medication Dose Route Frequency Provider Last Rate Last Dose  . 0.9 %  sodium chloride infusion  500 mL Intravenous Continuous Ladene Artist, MD        Allergies  Allergen Reactions  . Hydrocodone-Acetaminophen Nausea And Vomiting  . Gluten Meal     GI symptoms; has BM everytime she urinates  . Oxycodone Other (See Comments)    Review of Systems  Constitutional: Negative for fever.  Eyes: Negative for blurred vision.  Respiratory: Negative for cough and shortness of breath.   Cardiovascular: Negative for chest pain and palpitations.  Gastrointestinal: Negative for vomiting.  Musculoskeletal: Negative for back pain.  Skin: Negative for rash.  Neurological: Negative for loss of consciousness and headaches.       Objective:    Physical  Exam  Constitutional: She is oriented to person, place, and time. She appears well-developed and well-nourished. No distress.  HENT:  Head: Normocephalic and atraumatic.  Eyes: Conjunctivae are normal.  Neck: Normal range of motion. No thyromegaly present.  Cardiovascular: Normal rate and regular rhythm.   Pulmonary/Chest: Effort normal and breath sounds normal. She has no wheezes.  Abdominal: Soft. Bowel sounds are normal. There is no tenderness.  Musculoskeletal: Normal range of motion. She exhibits no edema or deformity.  Neurological: She is alert and oriented to person, place, and time.  Skin: Skin is warm and dry. She is not diaphoretic.  Psychiatric: She has a normal mood and affect.    BP 98/72 (BP Location: Left Arm, Patient Position: Sitting, Cuff Size: Normal)   Pulse (!) 52   Temp 98.7 F (37.1 C)   Ht 5' 5.5" (1.664 m)   Wt 174 lb 9.6 oz (79.2 kg)   LMP 03/29/2004   SpO2 96%   BMI 28.61 kg/m  Wt Readings from Last 3 Encounters:  03/11/16 174 lb 9.6 oz (79.2 kg)  03/07/16 174 lb (78.9 kg)  02/22/16 174 lb (78.9 kg)     Lab Results  Component Value Date   WBC 4.4 03/13/2015   HGB 13.7 03/13/2015   HCT 40.4 03/13/2015   PLT 166 03/13/2015   GLUCOSE 64 (L) 03/13/2015   CHOL 179 03/13/2015   TRIG 75 03/13/2015   HDL 57 03/13/2015   LDLCALC 107 03/13/2015   ALT 13 03/13/2015   AST 23 03/13/2015   NA 141 03/13/2015   K 4.1 03/13/2015   CL 104 03/13/2015   CREATININE 0.81 03/13/2015   BUN 13 03/13/2015   CO2 28 03/13/2015   TSH 1.118 03/13/2015   HGBA1C 5.3 03/13/2015   MICROALBUR 0.2 02/28/2014    Lab Results  Component Value Date   TSH 1.118 03/13/2015   Lab Results  Component Value Date   WBC 4.4 03/13/2015   HGB 13.7 03/13/2015   HCT 40.4 03/13/2015   MCV 95.5 03/13/2015   PLT 166 03/13/2015   Lab Results  Component Value Date   NA 141 03/13/2015   K 4.1 03/13/2015   CO2 28 03/13/2015   GLUCOSE 64 (L) 03/13/2015   BUN 13 03/13/2015    CREATININE 0.81 03/13/2015   BILITOT 0.8 03/13/2015   ALKPHOS 61 03/13/2015  AST 23 03/13/2015   ALT 13 03/13/2015   PROT 6.2 03/13/2015   ALBUMIN 4.0 03/13/2015   CALCIUM 9.1 03/13/2015   Lab Results  Component Value Date   CHOL 179 03/13/2015   Lab Results  Component Value Date   HDL 57 03/13/2015   Lab Results  Component Value Date   LDLCALC 107 03/13/2015   Lab Results  Component Value Date   TRIG 75 03/13/2015   Lab Results  Component Value Date   CHOLHDL 3.1 03/13/2015   Lab Results  Component Value Date   HGBA1C 5.3 03/13/2015       Assessment & Plan:   Problem List Items Addressed This Visit    None      I am having Ms. Etienne maintain her Probiotic Product (PROBIOTIC PO), Vitamin D, GAMMA AMINOBUTYRIC ACID PO, thyroid, LEVOXYL, MAGNESIUM GLYCINATE PLUS PO, Omega-3 Fatty Acids (FISH OIL PO), LORazepam, OVER THE COUNTER MEDICATION, escitalopram, and OVER THE COUNTER MEDICATION. We will continue to administer sodium chloride.  No orders of the defined types were placed in this encounter.    Magdalene Molly, [place]

## 2016-03-11 NOTE — Progress Notes (Signed)
Pre visit review using our clinic review tool, if applicable. No additional management support is needed unless otherwise documented below in the visit note. 

## 2016-03-11 NOTE — Patient Instructions (Addendum)
Gravol for nausea Ginger caps for nausea.  Try witch hazel astringent for irritation after bowel movement.  Preventive Care for Adults, Female A healthy lifestyle and preventive care can promote health and wellness. Preventive health guidelines for women include the following key practices.  A routine yearly physical is a good way to check with your health care provider about your health and preventive screening. It is a chance to share any concerns and updates on your health and to receive a thorough exam.  Visit your dentist for a routine exam and preventive care every 6 months. Brush your teeth twice a day and floss once a day. Good oral hygiene prevents tooth decay and gum disease.  The frequency of eye exams is based on your age, health, family medical history, use of contact lenses, and other factors. Follow your health care provider's recommendations for frequency of eye exams.  Eat a healthy diet. Foods like vegetables, fruits, whole grains, low-fat dairy products, and lean protein foods contain the nutrients you need without too many calories. Decrease your intake of foods high in solid fats, added sugars, and salt. Eat the right amount of calories for you.Get information about a proper diet from your health care provider, if necessary.  Regular physical exercise is one of the most important things you can do for your health. Most adults should get at least 150 minutes of moderate-intensity exercise (any activity that increases your heart rate and causes you to sweat) each week. In addition, most adults need muscle-strengthening exercises on 2 or more days a week.  Maintain a healthy weight. The body mass index (BMI) is a screening tool to identify possible weight problems. It provides an estimate of body fat based on height and weight. Your health care provider can find your BMI and can help you achieve or maintain a healthy weight.For adults 20 years and older:  A BMI below 18.5 is  considered underweight.  A BMI of 18.5 to 24.9 is normal.  A BMI of 25 to 29.9 is considered overweight.  A BMI of 30 and above is considered obese.  Maintain normal blood lipids and cholesterol levels by exercising and minimizing your intake of saturated fat. Eat a balanced diet with plenty of fruit and vegetables. Blood tests for lipids and cholesterol should begin at age 15 and be repeated every 5 years. If your lipid or cholesterol levels are high, you are over 50, or you are at high risk for heart disease, you Pattie need your cholesterol levels checked more frequently.Ongoing high lipid and cholesterol levels should be treated with medicines if diet and exercise are not working.  If you smoke, find out from your health care provider how to quit. If you do not use tobacco, do not start.  Lung cancer screening is recommended for adults aged 55-80 years who are at high risk for developing lung cancer because of a history of smoking. A yearly low-dose CT scan of the lungs is recommended for people who have at least a 30-pack-year history of smoking and are a current smoker or have quit within the past 15 years. A pack year of smoking is smoking an average of 1 pack of cigarettes a day for 1 year (for example: 1 pack a day for 30 years or 2 packs a day for 15 years). Yearly screening should continue until the smoker has stopped smoking for at least 15 years. Yearly screening should be stopped for people who develop a health problem that would prevent  them from having lung cancer treatment.  If you are pregnant, do not drink alcohol. If you are breastfeeding, be very cautious about drinking alcohol. If you are not pregnant and choose to drink alcohol, do not have more than 1 drink per day. One drink is considered to be 12 ounces (355 mL) of beer, 5 ounces (148 mL) of wine, or 1.5 ounces (44 mL) of liquor.  Avoid use of street drugs. Do not share needles with anyone. Ask for help if you need support or  instructions about stopping the use of drugs.  High blood pressure causes heart disease and increases the risk of stroke. Your blood pressure should be checked at least every 1 to 2 years. Ongoing high blood pressure should be treated with medicines if weight loss and exercise do not work.  If you are 21-58 years old, ask your health care provider if you should take aspirin to prevent strokes.  Diabetes screening is done by taking a blood sample to check your blood glucose level after you have not eaten for a certain period of time (fasting). If you are not overweight and you do not have risk factors for diabetes, you should be screened once every 3 years starting at age 84. If you are overweight or obese and you are 93-20 years of age, you should be screened for diabetes every year as part of your cardiovascular risk assessment.  Breast cancer screening is essential preventive care for women. You should practice "breast self-awareness." This means understanding the normal appearance and feel of your breasts and Davan include breast self-examination. Any changes detected, no matter how small, should be reported to a health care provider. Women in their 40s and 30s should have a clinical breast exam (CBE) by a health care provider as part of a regular health exam every 1 to 3 years. After age 62, women should have a CBE every year. Starting at age 23, women should consider having a mammogram (breast X-ray test) every year. Women who have a family history of breast cancer should talk to their health care provider about genetic screening. Women at a high risk of breast cancer should talk to their health care providers about having an MRI and a mammogram every year.  Breast cancer gene (BRCA)-related cancer risk assessment is recommended for women who have family members with BRCA-related cancers. BRCA-related cancers include breast, ovarian, tubal, and peritoneal cancers. Having family members with these  cancers Lesa be associated with an increased risk for harmful changes (mutations) in the breast cancer genes BRCA1 and BRCA2. Results of the assessment will determine the need for genetic counseling and BRCA1 and BRCA2 testing.  Your health care provider Paticia recommend that you be screened regularly for cancer of the pelvic organs (ovaries, uterus, and vagina). This screening involves a pelvic examination, including checking for microscopic changes to the surface of your cervix (Pap test). You Kinslea be encouraged to have this screening done every 3 years, beginning at age 69.  For women ages 23-65, health care providers Armonee recommend pelvic exams and Pap testing every 3 years, or they Andera recommend the Pap and pelvic exam, combined with testing for human papilloma virus (HPV), every 5 years. Some types of HPV increase your risk of cervical cancer. Testing for HPV Dierdre also be done on women of any age with unclear Pap test results.  Other health care providers Lovell not recommend any screening for nonpregnant women who are considered low risk for pelvic cancer and  who do not have symptoms. Ask your health care provider if a screening pelvic exam is right for you.  If you have had past treatment for cervical cancer or a condition that could lead to cancer, you need Pap tests and screening for cancer for at least 20 years after your treatment. If Pap tests have been discontinued, your risk factors (such as having a new sexual partner) need to be reassessed to determine if screening should resume. Some women have medical problems that increase the chance of getting cervical cancer. In these cases, your health care provider Raja recommend more frequent screening and Pap tests.  Colorectal cancer can be detected and often prevented. Most routine colorectal cancer screening begins at the age of 69 years and continues through age 49 years. However, your health care provider Reece recommend screening at an earlier age if you  have risk factors for colon cancer. On a yearly basis, your health care provider Laylana provide home test kits to check for hidden blood in the stool. Use of a small camera at the end of a tube, to directly examine the colon (sigmoidoscopy or colonoscopy), can detect the earliest forms of colorectal cancer. Talk to your health care provider about this at age 6, when routine screening begins. Direct exam of the colon should be repeated every 5-10 years through age 41 years, unless early forms of precancerous polyps or small growths are found.  People who are at an increased risk for hepatitis B should be screened for this virus. You are considered at high risk for hepatitis B if:  You were born in a country where hepatitis B occurs often. Talk with your health care provider about which countries are considered high risk.  Your parents were born in a high-risk country and you have not received a shot to protect against hepatitis B (hepatitis B vaccine).  You have HIV or AIDS.  You use needles to inject street drugs.  You live with, or have sex with, someone who has hepatitis B.  You get hemodialysis treatment.  You take certain medicines for conditions like cancer, organ transplantation, and autoimmune conditions.  Hepatitis C blood testing is recommended for all people born from [birth year] through 1965 and any individual with known risks for hepatitis C.  Practice safe sex. Use condoms and avoid high-risk sexual practices to reduce the spread of sexually transmitted infections (STIs). STIs include gonorrhea, chlamydia, syphilis, trichomonas, herpes, HPV, and human immunodeficiency virus (HIV). Herpes, HIV, and HPV are viral illnesses that have no cure. They can result in disability, cancer, and death.  You should be screened for sexually transmitted illnesses (STIs) including gonorrhea and chlamydia if:  You are sexually active and are younger than 24 years.  You are older than 24 years and your  health care provider tells you that you are at risk for this type of infection.  Your sexual activity has changed since you were last screened and you are at an increased risk for chlamydia or gonorrhea. Ask your health care provider if you are at risk.  If you are at risk of being infected with HIV, it is recommended that you take a prescription medicine daily to prevent HIV infection. This is called preexposure prophylaxis (PrEP). You are considered at risk if:  You are sexually active and do not regularly use condoms or know the HIV status of your partner(s).  You take drugs by injection.  You are sexually active with a partner who has HIV.  Talk  with your health care provider about whether you are at high risk of being infected with HIV. If you choose to begin PrEP, you should first be tested for HIV. You should then be tested every 3 months for as long as you are taking PrEP.  Osteoporosis is a disease in which the bones lose minerals and strength with aging. This can result in serious bone fractures or breaks. The risk of osteoporosis can be identified using a bone density scan. Women ages 37 years and over and women at risk for fractures or osteoporosis should discuss screening with their health care providers. Ask your health care provider whether you should take a calcium supplement or vitamin D to reduce the rate of osteoporosis.  Menopause can be associated with physical symptoms and risks. Hormone replacement therapy is available to decrease symptoms and risks. You should talk to your health care provider about whether hormone replacement therapy is right for you.  Use sunscreen. Apply sunscreen liberally and repeatedly throughout the day. You should seek shade when your shadow is shorter than you. Protect yourself by wearing long sleeves, pants, a wide-brimmed hat, and sunglasses year round, whenever you are outdoors.  Once a month, do a whole body skin exam, using a mirror to look  at the skin on your back. Tell your health care provider of new moles, moles that have irregular borders, moles that are larger than a pencil eraser, or moles that have changed in shape or color.  Stay current with required vaccines (immunizations).  Influenza vaccine. All adults should be immunized every year.  Tetanus, diphtheria, and acellular pertussis (Td, Tdap) vaccine. Pregnant women should receive 1 dose of Tdap vaccine during each pregnancy. The dose should be obtained regardless of the length of time since the last dose. Immunization is preferred during the 27th-36th week of gestation. An adult who has not previously received Tdap or who does not know her vaccine status should receive 1 dose of Tdap. This initial dose should be followed by tetanus and diphtheria toxoids (Td) booster doses every 10 years. Adults with an unknown or incomplete history of completing a 3-dose immunization series with Td-containing vaccines should begin or complete a primary immunization series including a Tdap dose. Adults should receive a Td booster every 10 years.  Varicella vaccine. An adult without evidence of immunity to varicella should receive 2 doses or a second dose if she has previously received 1 dose. Pregnant females who do not have evidence of immunity should receive the first dose after pregnancy. This first dose should be obtained before leaving the health care facility. The second dose should be obtained 4-8 weeks after the first dose.  Human papillomavirus (HPV) vaccine. Females aged 13-26 years who have not received the vaccine previously should obtain the 3-dose series. The vaccine is not recommended for use in pregnant females. However, pregnancy testing is not needed before receiving a dose. If a female is found to be pregnant after receiving a dose, no treatment is needed. In that case, the remaining doses should be delayed until after the pregnancy. Immunization is recommended for any person  with an immunocompromised condition through the age of 49 years if she did not get any or all doses earlier. During the 3-dose series, the second dose should be obtained 4-8 weeks after the first dose. The third dose should be obtained 24 weeks after the first dose and 16 weeks after the second dose.  Zoster vaccine. One dose is recommended for adults aged  60 years or older unless certain conditions are present.  Measles, mumps, and rubella (MMR) vaccine. Adults born before [birth year] generally are considered immune to measles and mumps. Adults born in [birth year] or later should have 1 or more doses of MMR vaccine unless there is a contraindication to the vaccine or there is laboratory evidence of immunity to each of the three diseases. A routine second dose of MMR vaccine should be obtained at least 28 days after the first dose for students attending postsecondary schools, health care workers, or international travelers. People who received inactivated measles vaccine or an unknown type of measles vaccine during 1963-1967 should receive 2 doses of MMR vaccine. People who received inactivated mumps vaccine or an unknown type of mumps vaccine before 1979 and are at high risk for mumps infection should consider immunization with 2 doses of MMR vaccine. For females of childbearing age, rubella immunity should be determined. If there is no evidence of immunity, females who are not pregnant should be vaccinated. If there is no evidence of immunity, females who are pregnant should delay immunization until after pregnancy. Unvaccinated health care workers born before [birth year] who lack laboratory evidence of measles, mumps, or rubella immunity or laboratory confirmation of disease should consider measles and mumps immunization with 2 doses of MMR vaccine or rubella immunization with 1 dose of MMR vaccine.  Pneumococcal 13-valent conjugate (PCV13) vaccine. When indicated, a person who is uncertain of his immunization history and has  no record of immunization should receive the PCV13 vaccine. All adults 36 years of age and older should receive this vaccine. An adult aged 63 years or older who has certain medical conditions and has not been previously immunized should receive 1 dose of PCV13 vaccine. This PCV13 should be followed with a dose of pneumococcal polysaccharide (PPSV23) vaccine. Adults who are at high risk for pneumococcal disease should obtain the PPSV23 vaccine at least 8 weeks after the dose of PCV13 vaccine. Adults older than 65 years of age who have normal immune system function should obtain the PPSV23 vaccine dose at least 1 year after the dose of PCV13 vaccine.  Pneumococcal polysaccharide (PPSV23) vaccine. When PCV13 is also indicated, PCV13 should be obtained first. All adults aged 67 years and older should be immunized. An adult younger than age 28 years who has certain medical conditions should be immunized. Any person who resides in a nursing home or long-term care facility should be immunized. An adult smoker should be immunized. People with an immunocompromised condition and certain other conditions should receive both PCV13 and PPSV23 vaccines. People with human immunodeficiency virus (HIV) infection should be immunized as soon as possible after diagnosis. Immunization during chemotherapy or radiation therapy should be avoided. Routine use of PPSV23 vaccine is not recommended for American Indians, [place] Natives, or people younger than 65 years unless there are medical conditions that require PPSV23 vaccine. When indicated, people who have unknown immunization and have no record of immunization should receive PPSV23 vaccine. One-time revaccination 5 years after the first dose of PPSV23 is recommended for people aged 19-64 years who have chronic kidney failure, nephrotic syndrome, asplenia, or immunocompromised conditions. People who received 1-2 doses of PPSV23 before age 70 years should receive another dose of PPSV23  vaccine at age 19 years or later if at least 5 years have passed since the previous dose. Doses of PPSV23 are not needed for people immunized with PPSV23 at or after age 57 years.  Meningococcal vaccine. Adults with asplenia or persistent  complement component deficiencies should receive 2 doses of quadrivalent meningococcal conjugate (MenACWY-D) vaccine. The doses should be obtained at least 2 months apart. Microbiologists working with certain meningococcal bacteria, Daphne recruits, people at risk during an outbreak, and people who travel to or live in countries with a high rate of meningitis should be immunized. A first-year college student up through age 1 years who is living in a residence hall should receive a dose if she did not receive a dose on or after her 16th birthday. Adults who have certain high-risk conditions should receive one or more doses of vaccine.  Hepatitis A vaccine. Adults who wish to be protected from this disease, have certain high-risk conditions, work with hepatitis A-infected animals, work in hepatitis A research labs, or travel to or work in countries with a high rate of hepatitis A should be immunized. Adults who were previously unvaccinated and who anticipate close contact with an international adoptee during the first 60 days after arrival in the [place] from a country with a high rate of hepatitis A should be immunized.  Hepatitis B vaccine. Adults who wish to be protected from this disease, have certain high-risk conditions, [place] be exposed to blood or other infectious body fluids, are household contacts or sex partners of hepatitis B positive people, are clients or workers in certain care facilities, or travel to or work in countries with a high rate of hepatitis B should be immunized.  Haemophilus influenzae type b (Hib) vaccine. A previously unvaccinated person with asplenia or sickle cell disease or having a scheduled splenectomy should receive 1 dose of Hib  vaccine. Regardless of previous immunization, a recipient of a hematopoietic stem cell transplant should receive a 3-dose series 6-12 months after her successful transplant. Hib vaccine is not recommended for adults with HIV infection. Preventive Services / Frequency Ages 19 to 68 years  Blood pressure check.** / Every 3-5 years.  Lipid and cholesterol check.** / Every 5 years beginning at age 16.  Clinical breast exam.** / Every 3 years for women in their 32s and 18s.  BRCA-related cancer risk assessment.** / For women who have family members with a BRCA-related cancer (breast, ovarian, tubal, or peritoneal cancers).  Pap test.** / Every 2 years from ages 59 through 41. Every 3 years starting at age 27 through age 62 or 67 with a history of 3 consecutive normal Pap tests.  HPV screening.** / Every 3 years from ages 39 through ages 42 to 47 with a history of 3 consecutive normal Pap tests.  Hepatitis C blood test.** / For any individual with known risks for hepatitis C.  Skin self-exam. / Monthly.  Influenza vaccine. / Every year.  Tetanus, diphtheria, and acellular pertussis (Tdap, Td) vaccine.** / Consult your health care provider. Pregnant women should receive 1 dose of Tdap vaccine during each pregnancy. 1 dose of Td every 10 years.  Varicella vaccine.** / Consult your health care provider. Pregnant females who do not have evidence of immunity should receive the first dose after pregnancy.  HPV vaccine. / 3 doses over 6 months, if 25 and younger. The vaccine is not recommended for use in pregnant females. However, pregnancy testing is not needed before receiving a dose.  Measles, mumps, rubella (MMR) vaccine.** / You need at least 1 dose of MMR if you were born in 1957 or later. You Shalamar also need a 2nd dose. For females of childbearing age, rubella immunity should be determined. If there is no evidence of immunity,  females who are not pregnant should be vaccinated. If there is no  evidence of immunity, females who are pregnant should delay immunization until after pregnancy.  Pneumococcal 13-valent conjugate (PCV13) vaccine.** / Consult your health care provider.  Pneumococcal polysaccharide (PPSV23) vaccine.** / 1 to 2 doses if you smoke cigarettes or if you have certain conditions.  Meningococcal vaccine.** / 1 dose if you are age 35 to 11 years and a Market researcher living in a residence hall, or have one of several medical conditions, you need to get vaccinated against meningococcal disease. You Chinwe also need additional booster doses.  Hepatitis A vaccine.** / Consult your health care provider.  Hepatitis B vaccine.** / Consult your health care provider.  Haemophilus influenzae type b (Hib) vaccine.** / Consult your health care provider. Ages 86 to 46 years  Blood pressure check.** / Every year.  Lipid and cholesterol check.** / Every 5 years beginning at age 40 years.  Lung cancer screening. / Every year if you are aged 83-80 years and have a 30-pack-year history of smoking and currently smoke or have quit within the past 15 years. Yearly screening is stopped once you have quit smoking for at least 15 years or develop a health problem that would prevent you from having lung cancer treatment.  Clinical breast exam.** / Every year after age 2 years.  BRCA-related cancer risk assessment.** / For women who have family members with a BRCA-related cancer (breast, ovarian, tubal, or peritoneal cancers).  Mammogram.** / Every year beginning at age 29 years and continuing for as long as you are in good health. Consult with your health care provider.  Pap test.** / Every 3 years starting at age 56 years through age 18 or 43 years with a history of 3 consecutive normal Pap tests.  HPV screening.** / Every 3 years from ages 53 years through ages 51 to 71 years with a history of 3 consecutive normal Pap tests.  Fecal occult blood test (FOBT) of stool. /  Every year beginning at age 36 years and continuing until age 51 years. You Tifani not need to do this test if you get a colonoscopy every 10 years.  Flexible sigmoidoscopy or colonoscopy.** / Every 5 years for a flexible sigmoidoscopy or every 10 years for a colonoscopy beginning at age 58 years and continuing until age 34 years.  Hepatitis C blood test.** / For all people born from [birth year] through 1965 and any individual with known risks for hepatitis C.  Skin self-exam. / Monthly.  Influenza vaccine. / Every year.  Tetanus, diphtheria, and acellular pertussis (Tdap/Td) vaccine.** / Consult your health care provider. Pregnant women should receive 1 dose of Tdap vaccine during each pregnancy. 1 dose of Td every 10 years.  Varicella vaccine.** / Consult your health care provider. Pregnant females who do not have evidence of immunity should receive the first dose after pregnancy.  Zoster vaccine.** / 1 dose for adults aged 23 years or older.  Measles, mumps, rubella (MMR) vaccine.** / You need at least 1 dose of MMR if you were born in 1957 or later. You Estrellita also need a second dose. For females of childbearing age, rubella immunity should be determined. If there is no evidence of immunity, females who are not pregnant should be vaccinated. If there is no evidence of immunity, females who are pregnant should delay immunization until after pregnancy.  Pneumococcal 13-valent conjugate (PCV13) vaccine.** / Consult your health care provider.  Pneumococcal polysaccharide (PPSV23) vaccine.** /  1 to 2 doses if you smoke cigarettes or if you have certain conditions.  Meningococcal vaccine.** / Consult your health care provider.  Hepatitis A vaccine.** / Consult your health care provider.  Hepatitis B vaccine.** / Consult your health care provider.  Haemophilus influenzae type b (Hib) vaccine.** / Consult your health care provider. Ages 41 years and over  Blood pressure check.** / Every year.  Lipid  and cholesterol check.** / Every 5 years beginning at age 16 years.  Lung cancer screening. / Every year if you are aged 53-80 years and have a 30-pack-year history of smoking and currently smoke or have quit within the past 15 years. Yearly screening is stopped once you have quit smoking for at least 15 years or develop a health problem that would prevent you from having lung cancer treatment.  Clinical breast exam.** / Every year after age 100 years.  BRCA-related cancer risk assessment.** / For women who have family members with a BRCA-related cancer (breast, ovarian, tubal, or peritoneal cancers).  Mammogram.** / Every year beginning at age 65 years and continuing for as long as you are in good health. Consult with your health care provider.  Pap test.** / Every 3 years starting at age 75 years through age 81 or 65 years with 3 consecutive normal Pap tests. Testing can be stopped between 65 and 70 years with 3 consecutive normal Pap tests and no abnormal Pap or HPV tests in the past 10 years.  HPV screening.** / Every 3 years from ages 53 years through ages 41 or 56 years with a history of 3 consecutive normal Pap tests. Testing can be stopped between 65 and 70 years with 3 consecutive normal Pap tests and no abnormal Pap or HPV tests in the past 10 years.  Fecal occult blood test (FOBT) of stool. / Every year beginning at age 66 years and continuing until age 93 years. You Arleigh not need to do this test if you get a colonoscopy every 10 years.  Flexible sigmoidoscopy or colonoscopy.** / Every 5 years for a flexible sigmoidoscopy or every 10 years for a colonoscopy beginning at age 79 years and continuing until age 56 years.  Hepatitis C blood test.** / For all people born from [birth year] through 1965 and any individual with known risks for hepatitis C.  Osteoporosis screening.** / A one-time screening for women ages 46 years and over and women at risk for fractures or osteoporosis.  Skin  self-exam. / Monthly.  Influenza vaccine. / Every year.  Tetanus, diphtheria, and acellular pertussis (Tdap/Td) vaccine.** / 1 dose of Td every 10 years.  Varicella vaccine.** / Consult your health care provider.  Zoster vaccine.** / 1 dose for adults aged 52 years or older.  Pneumococcal 13-valent conjugate (PCV13) vaccine.** / Consult your health care provider.  Pneumococcal polysaccharide (PPSV23) vaccine.** / 1 dose for all adults aged 4 years and older.  Meningococcal vaccine.** / Consult your health care provider.  Hepatitis A vaccine.** / Consult your health care provider.  Hepatitis B vaccine.** / Consult your health care provider.  Haemophilus influenzae type b (Hib) vaccine.** / Consult your health care provider. ** Family history and personal history of risk and conditions Arrionna change your health care provider's recommendations.   This information is not intended to replace advice given to you by your health care provider. Make sure you discuss any questions you have with your health care provider.   Document Released: 06/11/2001 Document Revised: 05/06/2014 Document Reviewed: 09/10/2010 Elsevier  Interactive Patient Education [employer].

## 2016-03-12 LAB — HEMOGLOBIN A1C: HEMOGLOBIN A1C: 5.4 % (ref 4.6–6.5)

## 2016-03-12 LAB — CBC
HEMATOCRIT: 40.5 % (ref 36.0–46.0)
Hemoglobin: 13.7 g/dL (ref 12.0–15.0)
MCHC: 33.9 g/dL (ref 30.0–36.0)
MCV: 95.3 fl (ref 78.0–100.0)
Platelets: 221 10*3/uL (ref 150.0–400.0)
RBC: 4.25 Mil/uL (ref 3.87–5.11)
RDW: 12.5 % (ref 11.5–15.5)
WBC: 5.4 10*3/uL (ref 4.0–10.5)

## 2016-03-12 LAB — COMPREHENSIVE METABOLIC PANEL
ALT: 20 U/L (ref 0–35)
AST: 29 U/L (ref 0–37)
Albumin: 4.4 g/dL (ref 3.5–5.2)
Alkaline Phosphatase: 67 U/L (ref 39–117)
BILIRUBIN TOTAL: 1 mg/dL (ref 0.2–1.2)
BUN: 16 mg/dL (ref 6–23)
CHLORIDE: 104 meq/L (ref 96–112)
CO2: 30 meq/L (ref 19–32)
Calcium: 9.4 mg/dL (ref 8.4–10.5)
Creatinine, Ser: 0.93 mg/dL (ref 0.40–1.20)
GFR: 64.21 mL/min (ref >60.00)
GLUCOSE: 83 mg/dL (ref 70–99)
Potassium: 4.5 mEq/L (ref 3.5–5.1)
Sodium: 141 mEq/L (ref 135–145)
Total Protein: 7 g/dL (ref 6.0–8.3)

## 2016-03-12 LAB — TSH: TSH: 2.76 u[IU]/mL (ref 0.35–4.50)

## 2016-03-12 LAB — LIPID PANEL
CHOL/HDL RATIO: 3
Cholesterol: 203 mg/dL — ABNORMAL HIGH (ref 0–200)
HDL: 58.2 mg/dL (ref >39.00)
LDL CALC: 133 mg/dL — AB (ref 0–99)
NONHDL: 144.71
TRIGLYCERIDES: 57 mg/dL (ref 0.0–149.0)
VLDL: 11.4 mg/dL (ref 0.0–40.0)

## 2016-03-12 LAB — T4, FREE: FREE T4: 0.78 ng/dL (ref 0.60–1.60)

## 2016-03-13 ENCOUNTER — Encounter: Payer: Self-pay | Admitting: Internal Medicine

## 2016-03-15 ENCOUNTER — Telehealth: Payer: Self-pay | Admitting: Family Medicine

## 2016-03-15 NOTE — Telephone Encounter (Signed)
Patient called requesting that she have her follow up appointment in April be a 30 min appointment. If this is approved I will add extra time or call patient to reschedule in a slot that allows for 30 mins. Please advise.   Patient phone: [phone]

## 2016-03-18 ENCOUNTER — Telehealth: Payer: Self-pay | Admitting: Family Medicine

## 2016-03-18 MED ORDER — ESCITALOPRAM OXALATE 5 MG PO TABS
5.0000 mg | ORAL_TABLET | Freq: Every day | ORAL | 2 refills | Status: DC
Start: 2016-03-18 — End: 2016-12-08

## 2016-03-18 NOTE — Telephone Encounter (Signed)
Refill done as requested to [facility] in [place]

## 2016-03-18 NOTE — Telephone Encounter (Signed)
Relation to [id number] Call back [phone] Pharmacy: [place], [place] [phone] (Phone) [phone] (Fax)    Reason for call:  Patient requesting a 90 day supply of  escitalopram (LEXAPRO) 5 MG tablet

## 2016-03-25 ENCOUNTER — Encounter: Payer: Self-pay | Admitting: Family Medicine

## 2016-03-25 DIAGNOSIS — Z7184 Encounter for health counseling related to travel: Secondary | ICD-10-CM

## 2016-03-25 DIAGNOSIS — Z0000 Encounter for general adult medical examination without abnormal findings: Secondary | ICD-10-CM | POA: Insufficient documentation

## 2016-03-25 HISTORY — DX: Encounter for health counseling related to travel: Z71.84

## 2016-03-25 NOTE — Assessment & Plan Note (Signed)
Doing well on current meds no changes 

## 2016-03-25 NOTE — Assessment & Plan Note (Signed)
Encouraged moist heat and gentle stretching as tolerated. Oswin try NSAIDs and prescription meds as directed and report if symptoms worsen or seek immediate care. Try topical treatments 

## 2016-03-25 NOTE — Assessment & Plan Note (Signed)
On Levothyroxine, continue to monitor 

## 2016-03-25 NOTE — Assessment & Plan Note (Signed)
Encouraged heart healthy diet, increase exercise, avoid trans fats, consider a krill oil cap daily 

## 2016-03-25 NOTE — Assessment & Plan Note (Signed)
Is traveling to [place] in December and will be on a cruise, is requesting Scopolamine patches for motion sickness is given

## 2016-03-25 NOTE — Assessment & Plan Note (Signed)
Maintain supplements and monitor

## 2016-04-02 DIAGNOSIS — D225 Melanocytic nevi of trunk: Secondary | ICD-10-CM | POA: Diagnosis not present

## 2016-04-02 DIAGNOSIS — D216 Benign neoplasm of connective and other soft tissue of trunk, unspecified: Secondary | ICD-10-CM | POA: Diagnosis not present

## 2016-04-17 ENCOUNTER — Encounter: Payer: Self-pay | Admitting: Internal Medicine

## 2016-05-10 ENCOUNTER — Ambulatory Visit (HOSPITAL_COMMUNITY)
Admission: EM | Admit: 2016-05-10 | Discharge: 2016-05-10 | Disposition: A | Discharge status: Home / Self Care | Payer: PRIVATE HEALTH INSURANCE | Attending: Family Medicine | Admitting: Family Medicine

## 2016-05-10 ENCOUNTER — Encounter (HOSPITAL_COMMUNITY): Payer: Self-pay | Admitting: *Deleted

## 2016-05-10 DIAGNOSIS — M542 Cervicalgia: Secondary | ICD-10-CM | POA: Diagnosis not present

## 2016-05-10 NOTE — Discharge Instructions (Signed)
Ice advil and plenty of fluids tonight and sat as needed, return if any concerns

## 2016-05-10 NOTE — ED Triage Notes (Signed)
Pt  Reports  It  Hurts  To  Swallow   As   Well
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
## 2016-05-13 ENCOUNTER — Ambulatory Visit (INDEPENDENT_AMBULATORY_CARE_PROVIDER_SITE_OTHER): Payer: Medicare Other | Admitting: Family Medicine

## 2016-05-13 ENCOUNTER — Encounter: Payer: Self-pay | Admitting: Family Medicine

## 2016-05-13 VITALS — BP 102/68 | HR 54 | Temp 98.0°F | Ht 65.0 in | Wt 171.8 lb

## 2016-05-13 DIAGNOSIS — S199XXA Unspecified injury of neck, initial encounter: Secondary | ICD-10-CM

## 2016-05-13 NOTE — Patient Instructions (Addendum)
If anything changes, let us know.   Heat on the area and stretching will be helpful.
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
## 2016-05-13 NOTE — Progress Notes (Signed)
Pre visit review using our clinic review tool, if applicable. No additional management support is needed unless otherwise documented below in the visit note. 

## 2016-05-14 ENCOUNTER — Telehealth: Payer: Self-pay

## 2016-05-14 NOTE — Telephone Encounter (Signed)
Called patient to follow up after her call to [facility] and to see if appointment could be scheduled for her. Left message on answering machine for return call.

## 2016-05-14 NOTE — Telephone Encounter (Signed)
Patient states she was seen for a follow up yesterday 05/13/16. She does not feel like she needs another follow up at this time, but appreciates someone checking on her.

## 2016-05-31 ENCOUNTER — Encounter: Payer: Self-pay | Admitting: Family Medicine

## 2016-05-31 DIAGNOSIS — M8589 Other specified disorders of bone density and structure, multiple sites: Secondary | ICD-10-CM | POA: Diagnosis not present

## 2016-05-31 DIAGNOSIS — Z1231 Encounter for screening mammogram for malignant neoplasm of breast: Secondary | ICD-10-CM | POA: Diagnosis not present

## 2016-05-31 LAB — HM MAMMOGRAPHY

## 2016-06-11 ENCOUNTER — Encounter: Payer: Self-pay | Admitting: Family Medicine

## 2016-06-11 ENCOUNTER — Telehealth: Payer: Self-pay | Admitting: Family Medicine

## 2016-06-11 NOTE — Telephone Encounter (Signed)
PCP received Bone Denisty Results/ PCP stated osteopenia notes/no changes from previous Patient is aware Copied and mailed to the patient. Original result sent to scan.

## 2016-08-05 ENCOUNTER — Encounter: Payer: Self-pay | Admitting: Family Medicine

## 2016-08-05 ENCOUNTER — Ambulatory Visit (INDEPENDENT_AMBULATORY_CARE_PROVIDER_SITE_OTHER): Payer: Medicare Other | Admitting: Family Medicine

## 2016-08-05 VITALS — BP 104/63 | HR 65 | Temp 98.2°F | Wt 165.2 lb

## 2016-08-05 DIAGNOSIS — E039 Hypothyroidism, unspecified: Secondary | ICD-10-CM

## 2016-08-05 DIAGNOSIS — N3281 Overactive bladder: Secondary | ICD-10-CM

## 2016-08-05 DIAGNOSIS — R350 Frequency of micturition: Secondary | ICD-10-CM

## 2016-08-05 DIAGNOSIS — E559 Vitamin D deficiency, unspecified: Secondary | ICD-10-CM | POA: Diagnosis not present

## 2016-08-05 DIAGNOSIS — E782 Mixed hyperlipidemia: Secondary | ICD-10-CM

## 2016-08-05 HISTORY — DX: Overactive bladder: N32.81

## 2016-08-05 NOTE — Progress Notes (Signed)
Patient ID: Shannon [name], female   DOB: 10-23-1950, 66 y.o.   MRN: [id number]   Subjective:  I acted as a Education administrator for Shannon Homans, MD. [place], [place]   Patient ID: Shannon [name], female    DOB: December 31, 1950, 66 y.o.   MRN: [id number]  Chief Complaint  Patient presents with  . Motion Sickness    64-month F/U.    HPI  Patient is in today for a 71-month F/U for motion sickness. Patient states that she has no current concerns with motion sickness. Patient has a Hx of hypothyroidism, Vitamin D deficiency, hyperlipidemia. Patient would like to have her thyroid levels rechecked. Also wants to discuss her cholesterol levels. Also believes that she either has allergies or sinus problems. No additional concerns noted at this time.No recent febrile illness or hospitalizations. Denies CP/palp/SOB/HA/fevers/GI or GU c/o. Taking meds as prescribed. Is noting some mild eye irritation and nasal congestion. No fevers or chills  Patient Care Team: Mosie Lukes, MD as PCP - General (Family Medicine)   Past Medical History:  Diagnosis Date  . ADD (attention deficit disorder with hyperactivity)   . Anxiety   . Asthma   . Cancer ([place])    melanoma- lip 2011  . Depression   . Diverticulosis   . Hyperlipidemia, mixed 03/11/2016  . Hypothyroidism   . Menopause   . Osteopenia   . Rosacea   . Travel advice encounter 03/25/2016    Past Surgical History:  Procedure Laterality Date  . blephoroplasty    . BROW LIFT    . DILATION AND CURETTAGE OF UTERUS    . SKIN CANCER EXCISION     right lower lip  . TONSILLECTOMY AND ADENOIDECTOMY      Family History  Problem Relation Age of Onset  . Colon polyps Mother   . Parkinson's disease Mother   . Diabetes Father   . Emphysema Father   . Tuberculosis Father   . Colon cancer Maternal Grandfather   . Alcohol abuse Sister   . Cancer Sister     skin cancer, melanoma cell, basil cell  . ADD / ADHD Daughter   . Anxiety disorder Daughter   . Depression Daughter     . ADD / ADHD Son   . Anxiety disorder Son   . Depression Son   . Cancer Sister     melanoma  . Anxiety disorder Brother     Social History   Social History  . Marital status: Single    Spouse name: N/A  . Number of children: 2  . Years of education: N/A   Occupational History  . Retired Tourist information centre manager    Social History Main Topics  . Smoking status: Never Smoker  . Smokeless tobacco: Never Used  . Alcohol use 1.8 - 2.4 oz/week    1 - 2 Glasses of wine, 1 Cans of beer, 1 Shots of liquor per week     Comment: rare  . Drug use: No  . Sexual activity: Not on file   Other Topics Concern  . Not on file   Social History Narrative   Retired from [employer].   No major dietary restrictions except avoid gluten.     Outpatient Medications Prior to Visit  Medication Sig Dispense Refill  . Cholecalciferol (VITAMIN D) 2000 UNITS CAPS Take by mouth 2 (two) times daily.     [name] escitalopram (LEXAPRO) 5 MG tablet Take 1 tablet (5 mg total) by mouth daily. 90 tablet 2  .  GAMMA AMINOBUTYRIC ACID PO Take 200 mg by mouth as needed. Up to bid    . hydrocortisone (PROCTOZONE-HC) 2.5 % rectal cream Place 1 application rectally 2 (two) times daily. 30 g 0  . LEVOXYL 50 MCG tablet Takes 50 mcg on everyday except Wednesday.    [name] LORazepam (ATIVAN) 2 MG tablet Take 1 tablet (2 mg total) by mouth every 8 (eight) hours as needed for anxiety. 90 tablet 0  . MAGNESIUM GLYCINATE PLUS PO Take 100 mg by mouth 2 (two) times daily.    [name] OVER THE COUNTER MEDICATION Influenzinum 30x   4 pellets 1 time a week.    [name] OVER THE COUNTER MEDICATION thytrophin pmg daily    . Probiotic Product (PROBIOTIC PO) Take 1 capsule by mouth daily.      [name] thyroid (ARMOUR THYROID) 15 MG tablet Take by mouth. 7.5 mg qd    . Omega-3 Fatty Acids (FISH OIL PO) Take 1,200 mg by mouth daily.    [name] scopolamine (TRANSDERM-SCOP, 1.5 MG,) 1 MG/3DAYS Place 1 patch (1.5 mg total) onto the skin every 3 (three) days. (Patient not  taking: Reported on 08/05/2016) 2 patch 0   Facility-Administered Medications Prior to Visit  Medication Dose Route Frequency Provider Last Rate Last Dose  . 0.9 %  sodium chloride infusion  500 mL Intravenous Continuous Ladene Artist, MD        Allergies  Allergen Reactions  . Hydrocodone-Acetaminophen Nausea And Vomiting  . Gluten Meal     GI symptoms; has BM everytime she urinates  . Oxycodone Other (See Comments)    Review of Systems  Constitutional: Negative for fever and malaise/fatigue.  HENT: Negative for congestion.   Eyes: Negative for blurred vision.  Respiratory: Negative for cough and shortness of breath.   Cardiovascular: Negative for chest pain, palpitations and leg swelling.  Gastrointestinal: Negative for vomiting.  Musculoskeletal: Negative for back pain.  Skin: Negative for rash.  Neurological: Negative for loss of consciousness and headaches.  Psychiatric/Behavioral: Negative for depression.       Objective:    Physical Exam  Constitutional: She is oriented to person, place, and time. She appears well-developed and well-nourished. No distress.  HENT:  Head: Normocephalic and atraumatic.  Eyes: Conjunctivae are normal.  Neck: Normal range of motion. No thyromegaly present.  Cardiovascular: Normal rate and regular rhythm.   Pulmonary/Chest: Effort normal and breath sounds normal. She has no wheezes.  Abdominal: Soft. Bowel sounds are normal. There is no tenderness.  Musculoskeletal: She exhibits no edema or deformity.  Neurological: She is alert and oriented to person, place, and time.  Skin: Skin is warm and dry. She is not diaphoretic.  Psychiatric: She has a normal mood and affect.    BP 104/63 (BP Location: Left Arm, Patient Position: Sitting, Cuff Size: Normal)   Pulse 65   Temp 98.2 F (36.8 C) (Oral)   Wt 165 lb 3.2 oz (74.9 kg)   LMP 03/29/2004   SpO2 97% Comment: RA  BMI 27.49 kg/m  Wt Readings from Last 3 Encounters:  08/05/16 165 lb  3.2 oz (74.9 kg)  05/13/16 171 lb 12.8 oz (77.9 kg)  03/11/16 174 lb 9.6 oz (79.2 kg)   BP Readings from Last 3 Encounters:  08/05/16 104/63  05/13/16 102/68  05/10/16 130/99     Immunization History  Administered Date(s) Administered  . Influenza Split 03/08/2013  . Influenza-Unspecified 03/07/2014  . PPD Test 03/08/2013, 02/28/2014, 03/13/2015  . Pneumococcal Conjugate-13 03/11/2016  .  Pneumococcal-Unspecified 04/08/2011  . Tdap 09/05/2009  . Zoster 02/28/2014    Health Maintenance  Topic Date Due  . PAP SMEAR  10/17/2014  . INFLUENZA VACCINE  11/27/2016  . PNA vac Low Risk Adult (2 of 2 - PPSV23) 03/11/2017  . MAMMOGRAM  05/31/2018  . TETANUS/TDAP  09/06/2019  . COLONOSCOPY  03/07/2026  . DEXA SCAN  Completed  . Hepatitis C Screening  Completed  . HIV Screening  Completed    Lab Results  Component Value Date   WBC 5.4 03/11/2016   HGB 13.7 03/11/2016   HCT 40.5 03/11/2016   PLT 221.0 03/11/2016   GLUCOSE 83 03/11/2016   CHOL 203 (H) 03/11/2016   TRIG 57.0 03/11/2016   HDL 58.20 03/11/2016   LDLCALC 133 (H) 03/11/2016   ALT 20 03/11/2016   AST 29 03/11/2016   NA 141 03/11/2016   K 4.5 03/11/2016   CL 104 03/11/2016   CREATININE 0.93 03/11/2016   BUN 16 03/11/2016   CO2 30 03/11/2016   TSH 2.76 03/11/2016   HGBA1C 5.4 03/11/2016   MICROALBUR 0.2 02/28/2014    Lab Results  Component Value Date   TSH 2.76 03/11/2016   Lab Results  Component Value Date   WBC 5.4 03/11/2016   HGB 13.7 03/11/2016   HCT 40.5 03/11/2016   MCV 95.3 03/11/2016   PLT 221.0 03/11/2016   Lab Results  Component Value Date   NA 141 03/11/2016   K 4.5 03/11/2016   CO2 30 03/11/2016   GLUCOSE 83 03/11/2016   BUN 16 03/11/2016   CREATININE 0.93 03/11/2016   BILITOT 1.0 03/11/2016   ALKPHOS 67 03/11/2016   AST 29 03/11/2016   ALT 20 03/11/2016   PROT 7.0 03/11/2016   ALBUMIN 4.4 03/11/2016   CALCIUM 9.4 03/11/2016   GFR 64.21 03/11/2016   Lab Results  Component  Value Date   CHOL 203 (H) 03/11/2016   Lab Results  Component Value Date   HDL 58.20 03/11/2016   Lab Results  Component Value Date   LDLCALC 133 (H) 03/11/2016   Lab Results  Component Value Date   TRIG 57.0 03/11/2016   Lab Results  Component Value Date   CHOLHDL 3 03/11/2016   Lab Results  Component Value Date   HGBA1C 5.4 03/11/2016         Assessment & Plan:   Problem List Items Addressed This Visit    Hypothyroidism    Labs wnl with last  Check. Check level today. She takes armour thyroid like with T3 and T4 and levothyroxine 50 mcg 6 days a week and a supplement with some bovine thyroid hormone in  It and she feels well      Relevant Orders   TSH   T4, free   Vitamin D deficiency    Continue daily supplements, check level      Relevant Orders   VITAMIN D 25 Hydroxy (Vit-D Deficiency, Fractures)   Hyperlipidemia, mixed    Encouraged heart healthy diet, increase exercise, avoid trans fats, consider a krill oil cap daily      Relevant Orders   Lipid panel   Urinary frequency - Primary    And some mild incontinence. Encouraged kegel exercises. And check urinalysis and urine culture.       Relevant Orders   Urinalysis   Urine Culture      I have discontinued Ms. Gaspar's Omega-3 Fatty Acids (FISH OIL PO) and scopolamine. I am also having her maintain her  Probiotic Product (PROBIOTIC PO), Vitamin D, GAMMA AMINOBUTYRIC ACID PO, thyroid, LEVOXYL, MAGNESIUM GLYCINATE PLUS PO, LORazepam, OVER THE COUNTER MEDICATION, OVER THE COUNTER MEDICATION, hydrocortisone, escitalopram, and Krill Oil. We will continue to administer sodium chloride.  Meds ordered this encounter  Medications  . DISCONTD: Krill Oil 500 MG CAPS    Sig: Take by mouth.  Javier Docker Oil 500 MG CAPS    [name] served as scribe during this visit. History, Physical and Plan performed by medical provider. Documentation and orders reviewed and attested to.  Shannon Homans, MD

## 2016-08-05 NOTE — Patient Instructions (Addendum)
Zinc, vitamin C, elderberry and aged or black garlic daily with congestion symptoms  Flonase/Fluticasone  Hypothyroidism Hypothyroidism is a disorder of the thyroid. The thyroid is a large gland that is located in the lower front of the neck. The thyroid releases hormones that control how the body works. With hypothyroidism, the thyroid does not make enough of these hormones. What are the causes? Causes of hypothyroidism [place] include:  Viral infections.  Pregnancy.  Your own defense system (immune system) attacking your thyroid.  Certain medicines.  Birth defects.  Past radiation treatments to your head or neck.  Past treatment with radioactive iodine.  Past surgical removal of part or all of your thyroid.  Problems with the gland that is located in the center of your brain (pituitary). What are the signs or symptoms? Signs and symptoms of hypothyroidism Jacqualyn include:  Feeling as though you have no energy (lethargy).  Inability to tolerate cold.  Weight gain that is not explained by a change in diet or exercise habits.  Dry skin.  Coarse hair.  Menstrual irregularity.  Slowing of thought processes.  Constipation.  Sadness or depression. How is this diagnosed? Your health care provider Amika diagnose hypothyroidism with blood tests and ultrasound tests. How is this treated? Hypothyroidism is treated with medicine that replaces the hormones that your body does not make. After you begin treatment, it Oney take several weeks for symptoms to go away. Follow these instructions at home:  Take medicines only as directed by your health care provider.  If you start taking any new medicines, tell your health care provider.  Keep all follow-up visits as directed by your health care provider. This is important. As your condition improves, your dosage needs Richele change. You will need to have blood tests regularly so that your health care provider can watch your condition. Contact  a health care provider if:  Your symptoms do not get better with treatment.  You are taking thyroid replacement medicine and:  You sweat excessively.  You have tremors.  You feel anxious.  You lose weight rapidly.  You cannot tolerate heat.  You have emotional swings.  You have diarrhea.  You feel weak. Get help right away if:  You develop chest pain.  You develop an irregular heartbeat. You develop a rapid heartbeat. Allergies, Adult An allergy is when your body's defense system (immune system) overreacts to an otherwise harmless substance (allergen) that you breathe in or eat or something that touches your skin. When you come into contact with something that you are allergic to, your immune system produces certain proteins (antibodies). These proteins cause cells to release chemicals (histamines) that trigger the symptoms of an allergic reaction. Allergies often affect the nasal passages (allergic rhinitis), eyes (allergic conjunctivitis), skin (atopic dermatitis), and stomach. Allergies can be mild or severe. Allergies cannot spread from person to person (are not contagious). They can develop at any age and Earnesteen be outgrown. What are the causes? Allergies can be caused by any substance that your immune system mistakenly targets as harmful. These Rettie include:  Outdoor allergens, such as pollen, grass, weeds, car exhaust, and mold spores.  Indoor allergens, such as dust, smoke, mold, and pet dander.  Foods, especially peanuts, milk, eggs, fish, shellfish, soy, nuts, and wheat.  Medicines, such as penicillin.  Skin irritants, such as detergents, chemicals, and latex.  Perfume.  Insect bites or stings. What increases the risk? You Maryfrances be at greater risk of allergies if other people in your family  have allergies. What are the signs or symptoms? Symptoms depend on what type of allergy you have. They Cassandra include:  Runny, stuffy nose.  Sneezing.  Itchy mouth, ears, or  throat.  Postnasal drip.  Sore throat.  Itchy, red, watery, or puffy eyes.  Skin rash or hives.  Stomach pain.  Vomiting.  Diarrhea.  Bloating.  Wheezing or coughing. People with a severe allergy to food, medicine, or an insect bite Caprice have a life-threatening allergic reaction (anaphylaxis). Symptoms of anaphylaxis include:  Hives.  Itching.  Flushed face.  Swollen lips, tongue, or mouth.  Tight or swollen throat.  Chest pain or tightness in the chest.  Trouble breathing or shortness of breath.  Rapid heartbeat.  Dizziness or fainting.  Vomiting.  Diarrhea.  Pain in the abdomen. How is this diagnosed? This condition is diagnosed based on:  Your symptoms.  Your family and medical history.  A physical exam. You Shanequia need to see a health care provider who specializes in treating allergies (allergist). You Journey also have tests, including:  Skin tests to see which allergens are causing your symptoms, such as:  Skin prick test. In this test, your skin is pricked with a tiny needle and exposed to small amounts of possible allergens to see if your skin reacts.  Intradermal skin test. In this test, a small amount of allergen is injected under your skin to see if your skin reacts.  Patch test. In this test, a small amount of allergen is placed on your skin and then your skin is covered with a bandage. Your health care provider will check your skin after a couple of days to see if a rash has developed.  Blood tests.  Challenges tests. In this test, you inhale a small amount of allergen by mouth to see if you have an allergic reaction. You Carmell also be asked to:  Keep a food diary. A food diary is a record of all the foods and drinks you have in a day and any symptoms you experience.  Practice an elimination diet. An elimination diet involves eliminating specific foods from your diet and then adding them back in one by one to find out if a certain food causes an  allergic reaction. How is this treated? Treatment for allergies depends on your symptoms. Treatment Lou include:  Cold compresses to soothe itching and swelling.  Eye drops.  Nasal sprays.  Using a saline spray or container (neti pot) to flush out the nose (nasal irrigation). These methods can help clear away mucus and keep the nasal passages moist.  Using a humidifier.  Oral antihistamines or other medicines to block allergic reaction and inflammation.  Skin creams to treat rashes or itching.  Diet changes to eliminate food allergy triggers.  Repeated exposure to tiny amounts of allergens to build up a tolerance and prevent future allergic reactions (immunotherapy). These include:  Allergy shots.  Oral treatment. This involves taking small doses of an allergen under the tongue (sublingual immunotherapy).  Emergency epinephrine injection (auto-injector) in case of an allergic emergency. This is a self-injectable, pre-measured medicine that must be given within the first few minutes of a serious allergic reaction. Follow these instructions at home:  Avoid known allergens whenever possible.  If you suffer from airborne allergens, wash out your nose daily. You can do this with a saline spray or a neti pot to flush out your nose (nasal irrigation).  Take over-the-counter and prescription medicines only as told by your health care  provider.  Keep all follow-up visits as told by your health care provider. This is important.  If you are at risk of a severe allergic reaction (anaphylaxis), keep your auto-injector with you at all times.  If you have ever had anaphylaxis, wear a medical alert bracelet or necklace that states you have a severe allergy. Contact a health care provider if:  Your symptoms do not improve with treatment. Get help right away if:  You have symptoms of anaphylaxis, such as:  Swollen mouth, tongue, or throat.  Pain or tightness in your chest.  Trouble  breathing or shortness of breath.  Dizziness or fainting.  Severe abdominal pain, vomiting, or diarrhea. This information is not intended to replace advice given to you by your health care provider. Make sure you discuss any questions you have with your health care provider. Document Released: 07/09/2002 Document Revised: 12/14/2015 Document Reviewed: 11/01/2015 Elsevier Interactive Patient Education  2017 [employer]. This information is not intended to replace advice given to you by your health care provider. Make sure you discuss any questions you have with your health care provider. Document Released: 04/15/2005 Document Revised: 09/21/2015 Document Reviewed: 08/31/2013 Elsevier Interactive Patient Education  2017 [employer].

## 2016-08-05 NOTE — Assessment & Plan Note (Addendum)
Labs wnl with last  Check. Check level today. She takes armour thyroid like with T3 and T4 and levothyroxine 50 mcg 6 days a week and a supplement with some bovine thyroid hormone in  It and she feels well

## 2016-08-05 NOTE — Assessment & Plan Note (Signed)
Continue daily supplements, check level

## 2016-08-05 NOTE — Assessment & Plan Note (Signed)
Encouraged heart healthy diet, increase exercise, avoid trans fats, consider a krill oil cap daily 

## 2016-08-05 NOTE — Assessment & Plan Note (Signed)
And some mild incontinence. Encouraged kegel exercises. And check urinalysis and urine culture.

## 2016-08-05 NOTE — Progress Notes (Signed)
Pre visit review using our clinic review tool, if applicable. No additional management support is needed unless otherwise documented below in the visit note. 

## 2016-08-06 ENCOUNTER — Encounter: Payer: Self-pay | Admitting: Family Medicine

## 2016-08-06 ENCOUNTER — Other Ambulatory Visit: Payer: Self-pay | Admitting: Family Medicine

## 2016-08-06 LAB — LIPID PANEL
CHOL/HDL RATIO: 3
Cholesterol: 173 mg/dL (ref 0–200)
HDL: 50.3 mg/dL (ref >39.00)
LDL CALC: 113 mg/dL — AB (ref 0–99)
NONHDL: 123.18
TRIGLYCERIDES: 53 mg/dL (ref 0.0–149.0)
VLDL: 10.6 mg/dL (ref 0.0–40.0)

## 2016-08-06 LAB — TSH: TSH: 2.91 u[IU]/mL (ref 0.35–4.50)

## 2016-08-06 LAB — URINALYSIS
Bilirubin Urine: NEGATIVE
Hgb urine dipstick: NEGATIVE
KETONES UR: NEGATIVE
Leukocytes, UA: NEGATIVE
Nitrite: NEGATIVE
PH: 6 (ref 5.0–8.0)
SPECIFIC GRAVITY, URINE: 1.025 (ref 1.000–1.030)
Total Protein, Urine: NEGATIVE
URINE GLUCOSE: NEGATIVE
Urobilinogen, UA: 0.2 (ref 0.0–1.0)

## 2016-08-06 LAB — URINE CULTURE: Organism ID, Bacteria: NO GROWTH

## 2016-08-06 LAB — T4, FREE: Free T4: 0.86 ng/dL (ref 0.60–1.60)

## 2016-08-06 MED ORDER — LEVOTHYROXINE SODIUM 50 MCG PO TABS: 50.0000 ug | ORAL_TABLET | Freq: Every day | ORAL | 1 refills | Status: DC

## 2016-08-07 LAB — VITAMIN D 25 HYDROXY (VIT D DEFICIENCY, FRACTURES): VITD: 98.87 ng/mL (ref 30.00–100.00)

## 2016-09-16 DIAGNOSIS — G5701 Lesion of sciatic nerve, right lower limb: Secondary | ICD-10-CM | POA: Diagnosis not present

## 2016-09-16 DIAGNOSIS — M9905 Segmental and somatic dysfunction of pelvic region: Secondary | ICD-10-CM | POA: Diagnosis not present

## 2016-09-16 DIAGNOSIS — M9904 Segmental and somatic dysfunction of sacral region: Secondary | ICD-10-CM | POA: Diagnosis not present

## 2016-09-16 DIAGNOSIS — M9903 Segmental and somatic dysfunction of lumbar region: Secondary | ICD-10-CM | POA: Diagnosis not present

## 2016-09-18 DIAGNOSIS — M9903 Segmental and somatic dysfunction of lumbar region: Secondary | ICD-10-CM | POA: Diagnosis not present

## 2016-09-18 DIAGNOSIS — G5701 Lesion of sciatic nerve, right lower limb: Secondary | ICD-10-CM | POA: Diagnosis not present

## 2016-09-18 DIAGNOSIS — M9904 Segmental and somatic dysfunction of sacral region: Secondary | ICD-10-CM | POA: Diagnosis not present

## 2016-09-18 DIAGNOSIS — M9905 Segmental and somatic dysfunction of pelvic region: Secondary | ICD-10-CM | POA: Diagnosis not present

## 2016-09-25 DIAGNOSIS — M9903 Segmental and somatic dysfunction of lumbar region: Secondary | ICD-10-CM | POA: Diagnosis not present

## 2016-09-25 DIAGNOSIS — M9905 Segmental and somatic dysfunction of pelvic region: Secondary | ICD-10-CM | POA: Diagnosis not present

## 2016-09-25 DIAGNOSIS — M9904 Segmental and somatic dysfunction of sacral region: Secondary | ICD-10-CM | POA: Diagnosis not present

## 2016-09-25 DIAGNOSIS — G5701 Lesion of sciatic nerve, right lower limb: Secondary | ICD-10-CM | POA: Diagnosis not present

## 2016-10-02 DIAGNOSIS — M9903 Segmental and somatic dysfunction of lumbar region: Secondary | ICD-10-CM | POA: Diagnosis not present

## 2016-10-02 DIAGNOSIS — M9904 Segmental and somatic dysfunction of sacral region: Secondary | ICD-10-CM | POA: Diagnosis not present

## 2016-10-02 DIAGNOSIS — M76892 Other specified enthesopathies of left lower limb, excluding foot: Secondary | ICD-10-CM | POA: Diagnosis not present

## 2016-10-02 DIAGNOSIS — G5702 Lesion of sciatic nerve, left lower limb: Secondary | ICD-10-CM | POA: Diagnosis not present

## 2016-10-04 DIAGNOSIS — M9906 Segmental and somatic dysfunction of lower extremity: Secondary | ICD-10-CM | POA: Diagnosis not present

## 2016-10-04 DIAGNOSIS — G5702 Lesion of sciatic nerve, left lower limb: Secondary | ICD-10-CM | POA: Diagnosis not present

## 2016-10-04 DIAGNOSIS — M545 Low back pain: Secondary | ICD-10-CM | POA: Diagnosis not present

## 2016-10-04 DIAGNOSIS — M9903 Segmental and somatic dysfunction of lumbar region: Secondary | ICD-10-CM | POA: Diagnosis not present

## 2016-10-04 DIAGNOSIS — M5412 Radiculopathy, cervical region: Secondary | ICD-10-CM | POA: Diagnosis not present

## 2016-10-04 DIAGNOSIS — M9901 Segmental and somatic dysfunction of cervical region: Secondary | ICD-10-CM | POA: Diagnosis not present

## 2016-10-10 DIAGNOSIS — M545 Low back pain: Secondary | ICD-10-CM | POA: Diagnosis not present

## 2016-10-10 DIAGNOSIS — M9904 Segmental and somatic dysfunction of sacral region: Secondary | ICD-10-CM | POA: Diagnosis not present

## 2016-10-10 DIAGNOSIS — G5702 Lesion of sciatic nerve, left lower limb: Secondary | ICD-10-CM | POA: Diagnosis not present

## 2016-10-10 DIAGNOSIS — M9903 Segmental and somatic dysfunction of lumbar region: Secondary | ICD-10-CM | POA: Diagnosis not present

## 2016-10-14 DIAGNOSIS — G5702 Lesion of sciatic nerve, left lower limb: Secondary | ICD-10-CM | POA: Diagnosis not present

## 2016-10-14 DIAGNOSIS — M9904 Segmental and somatic dysfunction of sacral region: Secondary | ICD-10-CM | POA: Diagnosis not present

## 2016-10-14 DIAGNOSIS — M9903 Segmental and somatic dysfunction of lumbar region: Secondary | ICD-10-CM | POA: Diagnosis not present

## 2016-10-14 DIAGNOSIS — M9905 Segmental and somatic dysfunction of pelvic region: Secondary | ICD-10-CM | POA: Diagnosis not present

## 2016-10-24 DIAGNOSIS — G5702 Lesion of sciatic nerve, left lower limb: Secondary | ICD-10-CM | POA: Diagnosis not present

## 2016-10-24 DIAGNOSIS — M545 Low back pain: Secondary | ICD-10-CM | POA: Diagnosis not present

## 2016-10-24 DIAGNOSIS — M9904 Segmental and somatic dysfunction of sacral region: Secondary | ICD-10-CM | POA: Diagnosis not present

## 2016-10-24 DIAGNOSIS — M9903 Segmental and somatic dysfunction of lumbar region: Secondary | ICD-10-CM | POA: Diagnosis not present

## 2016-11-19 DIAGNOSIS — M9903 Segmental and somatic dysfunction of lumbar region: Secondary | ICD-10-CM | POA: Diagnosis not present

## 2016-11-19 DIAGNOSIS — G5702 Lesion of sciatic nerve, left lower limb: Secondary | ICD-10-CM | POA: Diagnosis not present

## 2016-11-19 DIAGNOSIS — M9904 Segmental and somatic dysfunction of sacral region: Secondary | ICD-10-CM | POA: Diagnosis not present

## 2016-11-19 DIAGNOSIS — M9905 Segmental and somatic dysfunction of pelvic region: Secondary | ICD-10-CM | POA: Diagnosis not present

## 2016-11-20 DIAGNOSIS — E039 Hypothyroidism, unspecified: Secondary | ICD-10-CM | POA: Diagnosis not present

## 2016-12-08 ENCOUNTER — Other Ambulatory Visit: Payer: Self-pay | Admitting: Family Medicine

## 2016-12-19 DIAGNOSIS — M9905 Segmental and somatic dysfunction of pelvic region: Secondary | ICD-10-CM | POA: Diagnosis not present

## 2016-12-19 DIAGNOSIS — M7602 Gluteal tendinitis, left hip: Secondary | ICD-10-CM | POA: Diagnosis not present

## 2016-12-19 DIAGNOSIS — M9903 Segmental and somatic dysfunction of lumbar region: Secondary | ICD-10-CM | POA: Diagnosis not present

## 2016-12-19 DIAGNOSIS — M9904 Segmental and somatic dysfunction of sacral region: Secondary | ICD-10-CM | POA: Diagnosis not present

## 2016-12-23 DIAGNOSIS — M7602 Gluteal tendinitis, left hip: Secondary | ICD-10-CM | POA: Diagnosis not present

## 2017-01-02 DIAGNOSIS — M7602 Gluteal tendinitis, left hip: Secondary | ICD-10-CM | POA: Diagnosis not present

## 2017-01-02 DIAGNOSIS — M545 Low back pain: Secondary | ICD-10-CM | POA: Diagnosis not present

## 2017-01-16 DIAGNOSIS — M9905 Segmental and somatic dysfunction of pelvic region: Secondary | ICD-10-CM | POA: Diagnosis not present

## 2017-01-16 DIAGNOSIS — M9904 Segmental and somatic dysfunction of sacral region: Secondary | ICD-10-CM | POA: Diagnosis not present

## 2017-01-16 DIAGNOSIS — M7602 Gluteal tendinitis, left hip: Secondary | ICD-10-CM | POA: Diagnosis not present

## 2017-01-16 DIAGNOSIS — M9903 Segmental and somatic dysfunction of lumbar region: Secondary | ICD-10-CM | POA: Diagnosis not present

## 2017-01-21 DIAGNOSIS — M5412 Radiculopathy, cervical region: Secondary | ICD-10-CM | POA: Diagnosis not present

## 2017-01-21 DIAGNOSIS — M9901 Segmental and somatic dysfunction of cervical region: Secondary | ICD-10-CM | POA: Diagnosis not present

## 2017-02-17 DIAGNOSIS — H02831 Dermatochalasis of right upper eyelid: Secondary | ICD-10-CM | POA: Diagnosis not present

## 2017-02-17 DIAGNOSIS — H43813 Vitreous degeneration, bilateral: Secondary | ICD-10-CM | POA: Diagnosis not present

## 2017-02-17 DIAGNOSIS — H02834 Dermatochalasis of left upper eyelid: Secondary | ICD-10-CM | POA: Diagnosis not present

## 2017-02-17 DIAGNOSIS — H2513 Age-related nuclear cataract, bilateral: Secondary | ICD-10-CM | POA: Diagnosis not present

## 2017-02-17 DIAGNOSIS — H16293 Other keratoconjunctivitis, bilateral: Secondary | ICD-10-CM | POA: Diagnosis not present

## 2017-02-24 ENCOUNTER — Encounter: Payer: Self-pay | Admitting: Family Medicine

## 2017-02-24 ENCOUNTER — Ambulatory Visit (INDEPENDENT_AMBULATORY_CARE_PROVIDER_SITE_OTHER): Payer: Medicare Other | Admitting: Family Medicine

## 2017-02-24 VITALS — BP 98/62 | HR 59 | Temp 98.3°F | Resp 18 | Wt 173.8 lb

## 2017-02-24 DIAGNOSIS — M5136 Other intervertebral disc degeneration, lumbar region: Secondary | ICD-10-CM | POA: Insufficient documentation

## 2017-02-24 DIAGNOSIS — C439 Malignant melanoma of skin, unspecified: Secondary | ICD-10-CM

## 2017-02-24 DIAGNOSIS — E782 Mixed hyperlipidemia: Secondary | ICD-10-CM

## 2017-02-24 DIAGNOSIS — R739 Hyperglycemia, unspecified: Secondary | ICD-10-CM | POA: Insufficient documentation

## 2017-02-24 DIAGNOSIS — M5432 Sciatica, left side: Secondary | ICD-10-CM | POA: Diagnosis not present

## 2017-02-24 DIAGNOSIS — Z0000 Encounter for general adult medical examination without abnormal findings: Secondary | ICD-10-CM

## 2017-02-24 DIAGNOSIS — E559 Vitamin D deficiency, unspecified: Secondary | ICD-10-CM | POA: Diagnosis not present

## 2017-02-24 DIAGNOSIS — M51369 Other intervertebral disc degeneration, lumbar region without mention of lumbar back pain or lower extremity pain: Secondary | ICD-10-CM

## 2017-02-24 DIAGNOSIS — E039 Hypothyroidism, unspecified: Secondary | ICD-10-CM | POA: Diagnosis not present

## 2017-02-24 HISTORY — DX: Other intervertebral disc degeneration, lumbar region: M51.36

## 2017-02-24 HISTORY — DX: Hyperglycemia, unspecified: R73.9

## 2017-02-24 HISTORY — DX: Other intervertebral disc degeneration, lumbar region without mention of lumbar back pain or lower extremity pain: M51.369

## 2017-02-24 HISTORY — DX: Sciatica, left side: M54.32

## 2017-02-24 LAB — LIPID PANEL
CHOLESTEROL: 181 mg/dL (ref 0–200)
HDL: 56.4 mg/dL (ref >39.00)
LDL Cholesterol: 111 mg/dL — ABNORMAL HIGH (ref 0–99)
NonHDL: 124.36
Total CHOL/HDL Ratio: 3
Triglycerides: 68 mg/dL (ref 0.0–149.0)
VLDL: 13.6 mg/dL (ref 0.0–40.0)

## 2017-02-24 LAB — CBC
HEMATOCRIT: 41.5 % (ref 36.0–46.0)
Hemoglobin: 13.7 g/dL (ref 12.0–15.0)
MCHC: 32.9 g/dL (ref 30.0–36.0)
MCV: 98.8 fl (ref 78.0–100.0)
Platelets: 230 10*3/uL (ref 150.0–400.0)
RBC: 4.2 Mil/uL (ref 3.87–5.11)
RDW: 12.2 % (ref 11.5–15.5)
WBC: 5.6 10*3/uL (ref 4.0–10.5)

## 2017-02-24 LAB — COMPREHENSIVE METABOLIC PANEL
ALBUMIN: 4.2 g/dL (ref 3.5–5.2)
ALT: 11 U/L (ref 0–35)
AST: 19 U/L (ref 0–37)
Alkaline Phosphatase: 66 U/L (ref 39–117)
BILIRUBIN TOTAL: 0.8 mg/dL (ref 0.2–1.2)
BUN: 22 mg/dL (ref 6–23)
CO2: 30 mEq/L (ref 19–32)
CREATININE: 0.79 mg/dL (ref 0.40–1.20)
Calcium: 9.5 mg/dL (ref 8.4–10.5)
Chloride: 105 mEq/L (ref 96–112)
GFR: 77.28 mL/min (ref >60.00)
Glucose, Bld: 90 mg/dL (ref 70–99)
Potassium: 4.5 mEq/L (ref 3.5–5.1)
Sodium: 140 mEq/L (ref 135–145)
Total Protein: 6.7 g/dL (ref 6.0–8.3)

## 2017-02-24 LAB — T4, FREE: Free T4: 0.88 ng/dL (ref 0.60–1.60)

## 2017-02-24 LAB — HEMOGLOBIN A1C: HEMOGLOBIN A1C: 5.4 % (ref 4.6–6.5)

## 2017-02-24 LAB — VITAMIN D 25 HYDROXY (VIT D DEFICIENCY, FRACTURES): VITD: 58.16 ng/mL (ref 30.00–100.00)

## 2017-02-24 LAB — TSH: TSH: 1.25 u[IU]/mL (ref 0.35–4.50)

## 2017-02-24 MED ORDER — ESCITALOPRAM OXALATE 5 MG PO TABS: 5.0000 mg | ORAL_TABLET | Freq: Every day | ORAL | 1 refills | Status: DC

## 2017-02-24 NOTE — Assessment & Plan Note (Signed)
Check level Vitamin D today

## 2017-02-24 NOTE — Assessment & Plan Note (Signed)
Intermittent since January after she flew back from [place], with chiropractic and massage improved but not fully then it flared again after driving to [place]. Now she has the pain again daily. Encouraged to return to her PT and go to sports med if continues. Try Lidocaine patches

## 2017-02-24 NOTE — Progress Notes (Signed)
Subjective:  I acted as a Education administrator for Dr. Charlett Blake. [place], [place]   Patient ID: Shannon [name], female    DOB: December 16, 1950, 66 y.o.   MRN: [id number]  No chief complaint on file.   HPI  Patient is in today for Annual exam and follow-up on chronic medical conditions. Overall she feels well. She has been part of the remaining and has practitioners up there she sees as well. She has been taking some homeopathic and naturopathic medications for heavy metal issue she feels she had dating back years. Her greatest complaint today is of a left sided sciatica. Back in January she flew to [place] and upon arrival home has had a lot of posterior left hip pain and radiation down to her ankle. With massage and chiropractic improved only to recur when she drove to maintain. At this point she is back doing chiropractic and massage it is helping some but not enough and she is in agreement that she has decided to return to physical therapy which she has done previously with good results as well. If symptoms do not improve she will then return to sports medicine. She overall feels as well as she stopped quite some time and is less worried about her memory than she was last year. No recent febrile illness or acute hospitalizations. No polyuria or polydipsia. Denies CP/palp/SOB/HA/congestion/fevers/GI or GU c/o. Taking meds as prescribed. Doing well with ADLs at home  Patient Care Team: Mosie Lukes, MD as PCP - General (Family Medicine)   Past Medical History:  Diagnosis Date  . ADD (attention deficit disorder with hyperactivity)   . Anxiety   . Asthma   . Cancer ([place])    melanoma- lip 2011  . Depression   . Diverticulosis   . Hyperlipidemia, mixed 03/11/2016  . Hypothyroidism   . Menopause   . Osteopenia   . Rosacea   . Sciatica of left side 02/24/2017  . Travel advice encounter 03/25/2016    Past Surgical History:  Procedure Laterality Date  . blephoroplasty    . BROW LIFT    . DILATION AND  CURETTAGE OF UTERUS    . SKIN CANCER EXCISION     right lower lip  . TONSILLECTOMY AND ADENOIDECTOMY      Family History  Problem Relation Age of Onset  . Colon polyps Mother   . Parkinson's disease Mother   . Diabetes Father   . Emphysema Father   . Tuberculosis Father   . Colon cancer Maternal Grandfather   . Alcohol abuse Sister   . Cancer Sister        skin cancer, melanoma cell, basil cell  . ADD / ADHD Daughter   . Anxiety disorder Daughter   . Depression Daughter   . ADD / ADHD Son   . Anxiety disorder Son   . Depression Son   . Cancer Sister        melanoma  . Anxiety disorder Brother     Social History   Social History  . Marital status: Single    Spouse name: N/A  . Number of children: 2  . Years of education: N/A   Occupational History  . Retired Tourist information centre manager    Social History Main Topics  . Smoking status: Never Smoker  . Smokeless tobacco: Never Used  . Alcohol use 1.8 - 2.4 oz/week    1 - 2 Glasses of wine, 1 Cans of beer, 1 Shots of liquor per week     Comment:  rare  . Drug use: No  . Sexual activity: Not on file   Other Topics Concern  . Not on file   Social History Narrative   Retired from [employer].   No major dietary restrictions except avoid gluten.           Outpatient Medications Prior to Visit  Medication Sig Dispense Refill  . Cholecalciferol (VITAMIN D) 2000 UNITS CAPS Take by mouth 2 (two) times daily.     [name] GAMMA AMINOBUTYRIC ACID PO Take 200 mg by mouth as needed. Up to bid    . hydrocortisone (PROCTOZONE-HC) 2.5 % rectal cream Place 1 application rectally 2 (two) times daily. 30 g 0  . Krill Oil 500 MG CAPS     . levothyroxine (SYNTHROID, LEVOTHROID) 50 MCG tablet Take 1 tablet (50 mcg total) by mouth daily. 90 tablet 1  . LORazepam (ATIVAN) 2 MG tablet Take 1 tablet (2 mg total) by mouth every 8 (eight) hours as needed for anxiety. 90 tablet 0  . MAGNESIUM GLYCINATE PLUS PO Take 100 mg by mouth 2 (two) times daily.     [name] OVER THE COUNTER MEDICATION Influenzinum 30x   4 pellets 1 time a week.    . Probiotic Product (PROBIOTIC PO) Take 1 capsule by mouth daily.      [name] thyroid (ARMOUR THYROID) 15 MG tablet Take by mouth. 7.5 mg qd    . escitalopram (LEXAPRO) 5 MG tablet take 1 tablet by mouth daily 90 tablet 0  . OVER THE COUNTER MEDICATION thytrophin pmg daily    . 0.9 %  sodium chloride infusion      No facility-administered medications prior to visit.     Allergies  Allergen Reactions  . Hydrocodone-Acetaminophen Nausea And Vomiting  . Hydrocodone-Acetaminophen Nausea And Vomiting  . Gluten Meal Other (See Comments)    GI symptoms; has BM everytime she urinates GI symptoms; has BM everytime she urinates  . Oxycodone Other (See Comments)    Review of Systems  Constitutional: Negative for chills, fever and malaise/fatigue.  HENT: Negative for congestion and hearing loss.   Eyes: Negative for discharge.  Respiratory: Negative for cough, sputum production and shortness of breath.   Cardiovascular: Negative for chest pain, palpitations and leg swelling.  Gastrointestinal: Negative for abdominal pain, blood in stool, constipation, diarrhea, heartburn, nausea and vomiting.  Genitourinary: Negative for dysuria, frequency, hematuria and urgency.  Musculoskeletal: Positive for back pain and joint pain. Negative for falls and myalgias.  Skin: Negative for rash.  Neurological: Negative for dizziness, sensory change, loss of consciousness, weakness and headaches.  Endo/Heme/Allergies: Negative for environmental allergies. Does not bruise/bleed easily.  Psychiatric/Behavioral: Negative for depression and suicidal ideas. The patient is not nervous/anxious and does not have insomnia.        Objective:    Physical Exam  Constitutional: She is oriented to person, place, and time. She appears well-developed and well-nourished. No distress.  HENT:  Head: Normocephalic and atraumatic.  Eyes: Conjunctivae  are normal.  Neck: Neck supple. No thyromegaly present.  Cardiovascular: Normal rate, regular rhythm and normal heart sounds.   No murmur heard. Pulmonary/Chest: Effort normal and breath sounds normal. No respiratory distress.  Abdominal: Soft. Bowel sounds are normal. She exhibits no distension and no mass. There is no tenderness.  Musculoskeletal: She exhibits no edema.  Lymphadenopathy:    She has no cervical adenopathy.  Neurological: She is alert and oriented to person, place, and time.  Skin: Skin is warm and dry.  Psychiatric: She has a normal mood and affect. Her behavior is normal.    BP 98/62 (BP Location: Left Arm, Patient Position: Sitting, Cuff Size: Normal)   Pulse (!) 59   Temp 98.3 F (36.8 C) (Oral)   Resp 18   Wt 173 lb 12.8 oz (78.8 kg)   LMP 03/29/2004   SpO2 97%   BMI 28.92 kg/m  Wt Readings from Last 3 Encounters:  02/24/17 173 lb 12.8 oz (78.8 kg)  08/05/16 165 lb 3.2 oz (74.9 kg)  05/13/16 171 lb 12.8 oz (77.9 kg)   BP Readings from Last 3 Encounters:  02/24/17 98/62  08/05/16 104/63  05/13/16 102/68     Immunization History  Administered Date(s) Administered  . Influenza Split 03/08/2013  . Influenza-Unspecified 03/07/2014  . PPD Test 03/08/2013, 02/28/2014, 03/13/2015  . Pneumococcal Conjugate-13 03/11/2016  . Pneumococcal-Unspecified 04/08/2011  . Tdap 09/05/2009  . Zoster 02/28/2014    Health Maintenance  Topic Date Due  . INFLUENZA VACCINE  07/27/2017 (Originally 11/27/2016)  . PNA vac Low Risk Adult (2 of 2 - PPSV23) 03/11/2017  . MAMMOGRAM  05/31/2018  . TETANUS/TDAP  09/06/2019  . COLONOSCOPY  03/07/2026  . DEXA SCAN  Completed  . Hepatitis C Screening  Completed    Lab Results  Component Value Date   WBC 5.6 02/24/2017   HGB 13.7 02/24/2017   HCT 41.5 02/24/2017   PLT 230.0 02/24/2017   GLUCOSE 90 02/24/2017   CHOL 181 02/24/2017   TRIG 68.0 02/24/2017   HDL 56.40 02/24/2017   LDLCALC 111 (H) 02/24/2017   ALT 11  02/24/2017   AST 19 02/24/2017   NA 140 02/24/2017   K 4.5 02/24/2017   CL 105 02/24/2017   CREATININE 0.79 02/24/2017   BUN 22 02/24/2017   CO2 30 02/24/2017   TSH 1.25 02/24/2017   HGBA1C 5.4 02/24/2017   MICROALBUR 0.2 02/28/2014    Lab Results  Component Value Date   TSH 1.25 02/24/2017   Lab Results  Component Value Date   WBC 5.6 02/24/2017   HGB 13.7 02/24/2017   HCT 41.5 02/24/2017   MCV 98.8 02/24/2017   PLT 230.0 02/24/2017   Lab Results  Component Value Date   NA 140 02/24/2017   K 4.5 02/24/2017   CO2 30 02/24/2017   GLUCOSE 90 02/24/2017   BUN 22 02/24/2017   CREATININE 0.79 02/24/2017   BILITOT 0.8 02/24/2017   ALKPHOS 66 02/24/2017   AST 19 02/24/2017   ALT 11 02/24/2017   PROT 6.7 02/24/2017   ALBUMIN 4.2 02/24/2017   CALCIUM 9.5 02/24/2017   GFR 77.28 02/24/2017   Lab Results  Component Value Date   CHOL 181 02/24/2017   Lab Results  Component Value Date   HDL 56.40 02/24/2017   Lab Results  Component Value Date   LDLCALC 111 (H) 02/24/2017   Lab Results  Component Value Date   TRIG 68.0 02/24/2017   Lab Results  Component Value Date   CHOLHDL 3 02/24/2017   Lab Results  Component Value Date   HGBA1C 5.4 02/24/2017         Assessment & Plan:   Problem List Items Addressed This Visit    Hypothyroidism    On Levothyroxine, continue to monitor      Relevant Orders   CBC (Completed)   TSH (Completed)   T4, free (Completed)   Vitamin D deficiency    Check level Vitamin D today      Relevant  Orders   CBC (Completed)   Comprehensive metabolic panel (Completed)   VITAMIN D 25 Hydroxy (Vit-D Deficiency, Fractures) (Completed)   Hyperlipidemia, mixed    Encouraged heart healthy diet, increase exercise, avoid trans fats, consider a krill oil cap daily      Relevant Orders   CBC (Completed)   Lipid panel (Completed)   Melanoma of skin ([place])    Stage 0 follows annually with dermatology      Medicare welcome exam -  Primary    Patient denies any difficulties at home. No trouble with ADLs, depression or falls. See EMR for functional status screen and depression screen. No recent changes to vision or hearing. Is UTD with immunizations. Is UTD with screening. Discussed Advanced Directives. Encouraged heart healthy diet, exercise as tolerated and adequate sleep. See patient's problem list for health risk factors to monitor. See AVS for preventative healthcare recommendation schedule. Labs ordered today. She has advanced directives asked to bring [place] a copy. MGM in February 2018, repeat annually. Colonoscopy in 02/2016, repeat in 5 years. No recent vision or hearing changes.       Hyperglycemia    hgba1c acceptable, minimize simple carbs. Increase exercise as tolerated      Relevant Orders   CBC (Completed)   Comprehensive metabolic panel (Completed)   Hemoglobin A1c (Completed)   Sciatica of left side    Intermittent since January after she flew back from [place], with chiropractic and massage improved but not fully then it flared again after driving to [place]. Now she has the pain again daily. Encouraged to return to her PT and go to sports med if continues. Try Lidocaine patches      Relevant Medications   escitalopram (LEXAPRO) 5 MG tablet      I have changed Ms. Kepner's escitalopram. I am also having her maintain her Probiotic Product (PROBIOTIC PO), Vitamin D, GAMMA AMINOBUTYRIC ACID PO, thyroid, MAGNESIUM GLYCINATE PLUS PO, LORazepam, OVER THE COUNTER MEDICATION, hydrocortisone, Krill Oil, and levothyroxine. We will stop administering sodium chloride.  Meds ordered this encounter  Medications  . escitalopram (LEXAPRO) 5 MG tablet    Sig: Take 1 tablet (5 mg total) by mouth daily.    Dispense:  90 tablet    Refill:  1    [name] served as Education administrator during this visit. History, Physical and Plan performed by medical provider. Documentation and orders reviewed and attested to.  Penni Homans, MD

## 2017-02-24 NOTE — Assessment & Plan Note (Signed)
On Levothyroxine, continue to monitor 

## 2017-02-24 NOTE — Assessment & Plan Note (Addendum)
Patient denies any difficulties at home. No trouble with ADLs, depression or falls. See EMR for functional status screen and depression screen. No recent changes to vision or hearing. Is UTD with immunizations. Is UTD with screening. Discussed Advanced Directives. Encouraged heart healthy diet, exercise as tolerated and adequate sleep. See patient's problem list for health risk factors to monitor. See AVS for preventative healthcare recommendation schedule. Labs ordered today. She has advanced directives asked to bring [place] a copy. MGM in February 2018, repeat annually. Colonoscopy in 02/2016, repeat in 5 years. No recent vision or hearing changes.

## 2017-02-24 NOTE — Assessment & Plan Note (Signed)
hgba1c acceptable, minimize simple carbs. Increase exercise as tolerated.  

## 2017-02-24 NOTE — Assessment & Plan Note (Signed)
Stage 0 follows annually with dermatology

## 2017-02-24 NOTE — Patient Instructions (Addendum)
Lidocaine patches by Jenel Lucks, Icy Hot, Aspercreme makes them to the hip  New shingles shot, Shingrix is 2 shots over 2-6 months, can get at [place] 65 Years and Older, Female Preventive care refers to lifestyle choices and visits with your health care provider that can promote health and wellness. What does preventive care include?  A yearly physical exam. This is also called an annual well check.  Dental exams once or twice a year.  Routine eye exams. Ask your health care provider how often you should have your eyes checked.  Personal lifestyle choices, including: ? Daily care of your teeth and gums. ? Regular physical activity. ? Eating a healthy diet. ? Avoiding tobacco and drug use. ? Limiting alcohol use. ? Practicing safe sex. ? Taking low-dose aspirin every day. ? Taking vitamin and mineral supplements as recommended by your health care provider. What happens during an annual well check? The services and screenings done by your health care provider during your annual well check will depend on your age, overall health, lifestyle risk factors, and family history of disease. Counseling Your health care provider Makalya ask you questions about your:  Alcohol use.  Tobacco use.  Drug use.  Emotional well-being.  Home and relationship well-being.  Sexual activity.  Eating habits.  History of falls.  Memory and ability to understand (cognition).  Work and work Statistician.  Reproductive health.  Screening You Jeanae have the following tests or measurements:  Height, weight, and BMI.  Blood pressure.  Lipid and cholesterol levels. These Evelena be checked every 5 years, or more frequently if you are over 59 years old.  Skin check.  Lung cancer screening. You Aranda have this screening every year starting at age 78 if you have a 30-pack-year history of smoking and currently smoke or have quit within the past 15 years.  Fecal occult blood test  (FOBT) of the stool. You Media have this test every year starting at age 37.  Flexible sigmoidoscopy or colonoscopy. You Roniqua have a sigmoidoscopy every 5 years or a colonoscopy every 10 years starting at age 10.  Hepatitis C blood test.  Hepatitis B blood test.  Sexually transmitted disease (STD) testing.  Diabetes screening. This is done by checking your blood sugar (glucose) after you have not eaten for a while (fasting). You Temitope have this done every 1-3 years.  Bone density scan. This is done to screen for osteoporosis. You Aella have this done starting at age 66.  Mammogram. This Oluwadara be done every 1-2 years. Talk to your health care provider about how often you should have regular mammograms.  Talk with your health care provider about your test results, treatment options, and if necessary, the need for more tests. Vaccines Your health care provider Marcel recommend certain vaccines, such as:  Influenza vaccine. This is recommended every year.  Tetanus, diphtheria, and acellular pertussis (Tdap, Td) vaccine. You Niyah need a Td booster every 10 years.  Varicella vaccine. You Garyn need this if you have not been vaccinated.  Zoster vaccine. You Maneh need this after age 10.  Measles, mumps, and rubella (MMR) vaccine. You Isabele need at least one dose of MMR if you were born in 1957 or later. You Neka also need a second dose.  Pneumococcal 13-valent conjugate (PCV13) vaccine. One dose is recommended after age 86.  Pneumococcal polysaccharide (PPSV23) vaccine. One dose is recommended after age 30.  Meningococcal vaccine. You Eliane need this if you have  certain conditions.  Hepatitis A vaccine. You Mariana need this if you have certain conditions or if you travel or work in places where you Jamarria be exposed to hepatitis A.  Hepatitis B vaccine. You Meliza need this if you have certain conditions or if you travel or work in places where you Sora be exposed to hepatitis B.  Haemophilus influenzae type b  (Hib) vaccine. You Jalyne need this if you have certain conditions.  Talk to your health care provider about which screenings and vaccines you need and how often you need them. This information is not intended to replace advice given to you by your health care provider. Make sure you discuss any questions you have with your health care provider. Document Released: 05/12/2015 Document Revised: 01/03/2016 Document Reviewed: 02/14/2015 Elsevier Interactive Patient Education  2017 [employer].

## 2017-02-24 NOTE — Assessment & Plan Note (Signed)
Encouraged heart healthy diet, increase exercise, avoid trans fats, consider a krill oil cap daily 

## 2017-02-27 DIAGNOSIS — D2371 Other benign neoplasm of skin of right lower limb, including hip: Secondary | ICD-10-CM | POA: Diagnosis not present

## 2017-02-27 DIAGNOSIS — D2261 Melanocytic nevi of right upper limb, including shoulder: Secondary | ICD-10-CM | POA: Diagnosis not present

## 2017-02-27 DIAGNOSIS — Z86008 Personal history of in-situ neoplasm of other site: Secondary | ICD-10-CM | POA: Diagnosis not present

## 2017-02-27 DIAGNOSIS — Z23 Encounter for immunization: Secondary | ICD-10-CM | POA: Diagnosis not present

## 2017-02-27 DIAGNOSIS — Z86018 Personal history of other benign neoplasm: Secondary | ICD-10-CM | POA: Diagnosis not present

## 2017-02-27 DIAGNOSIS — D2272 Melanocytic nevi of left lower limb, including hip: Secondary | ICD-10-CM | POA: Diagnosis not present

## 2017-02-27 DIAGNOSIS — L821 Other seborrheic keratosis: Secondary | ICD-10-CM | POA: Diagnosis not present

## 2017-02-27 DIAGNOSIS — D2271 Melanocytic nevi of right lower limb, including hip: Secondary | ICD-10-CM | POA: Diagnosis not present

## 2017-02-27 DIAGNOSIS — D225 Melanocytic nevi of trunk: Secondary | ICD-10-CM | POA: Diagnosis not present

## 2017-02-27 DIAGNOSIS — D2361 Other benign neoplasm of skin of right upper limb, including shoulder: Secondary | ICD-10-CM | POA: Diagnosis not present

## 2017-02-27 DIAGNOSIS — D2230 Melanocytic nevi of unspecified part of face: Secondary | ICD-10-CM | POA: Diagnosis not present

## 2017-04-01 ENCOUNTER — Encounter: Payer: Self-pay | Admitting: Sports Medicine

## 2017-04-01 ENCOUNTER — Ambulatory Visit (INDEPENDENT_AMBULATORY_CARE_PROVIDER_SITE_OTHER): Payer: Medicare Other | Admitting: Sports Medicine

## 2017-04-01 DIAGNOSIS — M67952 Unspecified disorder of synovium and tendon, left thigh: Secondary | ICD-10-CM | POA: Diagnosis present

## 2017-04-01 HISTORY — DX: Unspecified disorder of synovium and tendon, left thigh: M67.952

## 2017-04-01 MED ORDER — AMITRIPTYLINE HCL 25 MG PO TABS: 25.0000 mg | ORAL_TABLET | Freq: Every day | ORAL | 1 refills | Status: DC

## 2017-04-01 NOTE — Assessment & Plan Note (Signed)
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

## 2017-04-01 NOTE — Patient Instructions (Signed)
Given Hip handouts

## 2017-04-01 NOTE — Progress Notes (Signed)
CC: left hip pain and sciatica  Patient with sxs since last Jan. Treated by Dr Hardin Negus and another chiropractor Probably initially started with piriiformis syndrome as her sxs did improve with ART and other manual work Now hurts if she sits too long With walking - increased radiation down lateral leg to knee In past she had some sciatic sxs radiating all the way to the foot No true classic sciatic sxs now but more lateral No specific injury but was exercising and doing a lot of biking at onset  ROS Denies low back pain No cough sneze pain No numbness into foot  PE NAD.  Pleasant F BP 120/76   Ht 5\' 5"  (1.651 m)   Wt 165 lb (74.8 kg)   LMP 03/29/2004   BMI 27.46 kg/m   Neg SLR to 90 deg Hip - full ROM no pain with testing FABER tighter on left TTP just behind grt troch on left only No real TTP over piriformis Piriformis stretch with no pain  Hip abduction weakness significant on left Hip rotation and flexion oare strong Glut Max is strong as is TFL  Gait with minimal limp

## 2017-04-19 IMAGING — CR DG KNEE COMPLETE 4+V*R*
1 series · 1 of 1 positions shown · non-contrast
Comparison: No prior.

CLINICAL DATA: Chronic knee pain. No known injury. Initial
evaluation.

EXAM:
RIGHT KNEE - COMPLETE 4+ VIEW

[view not recorded]
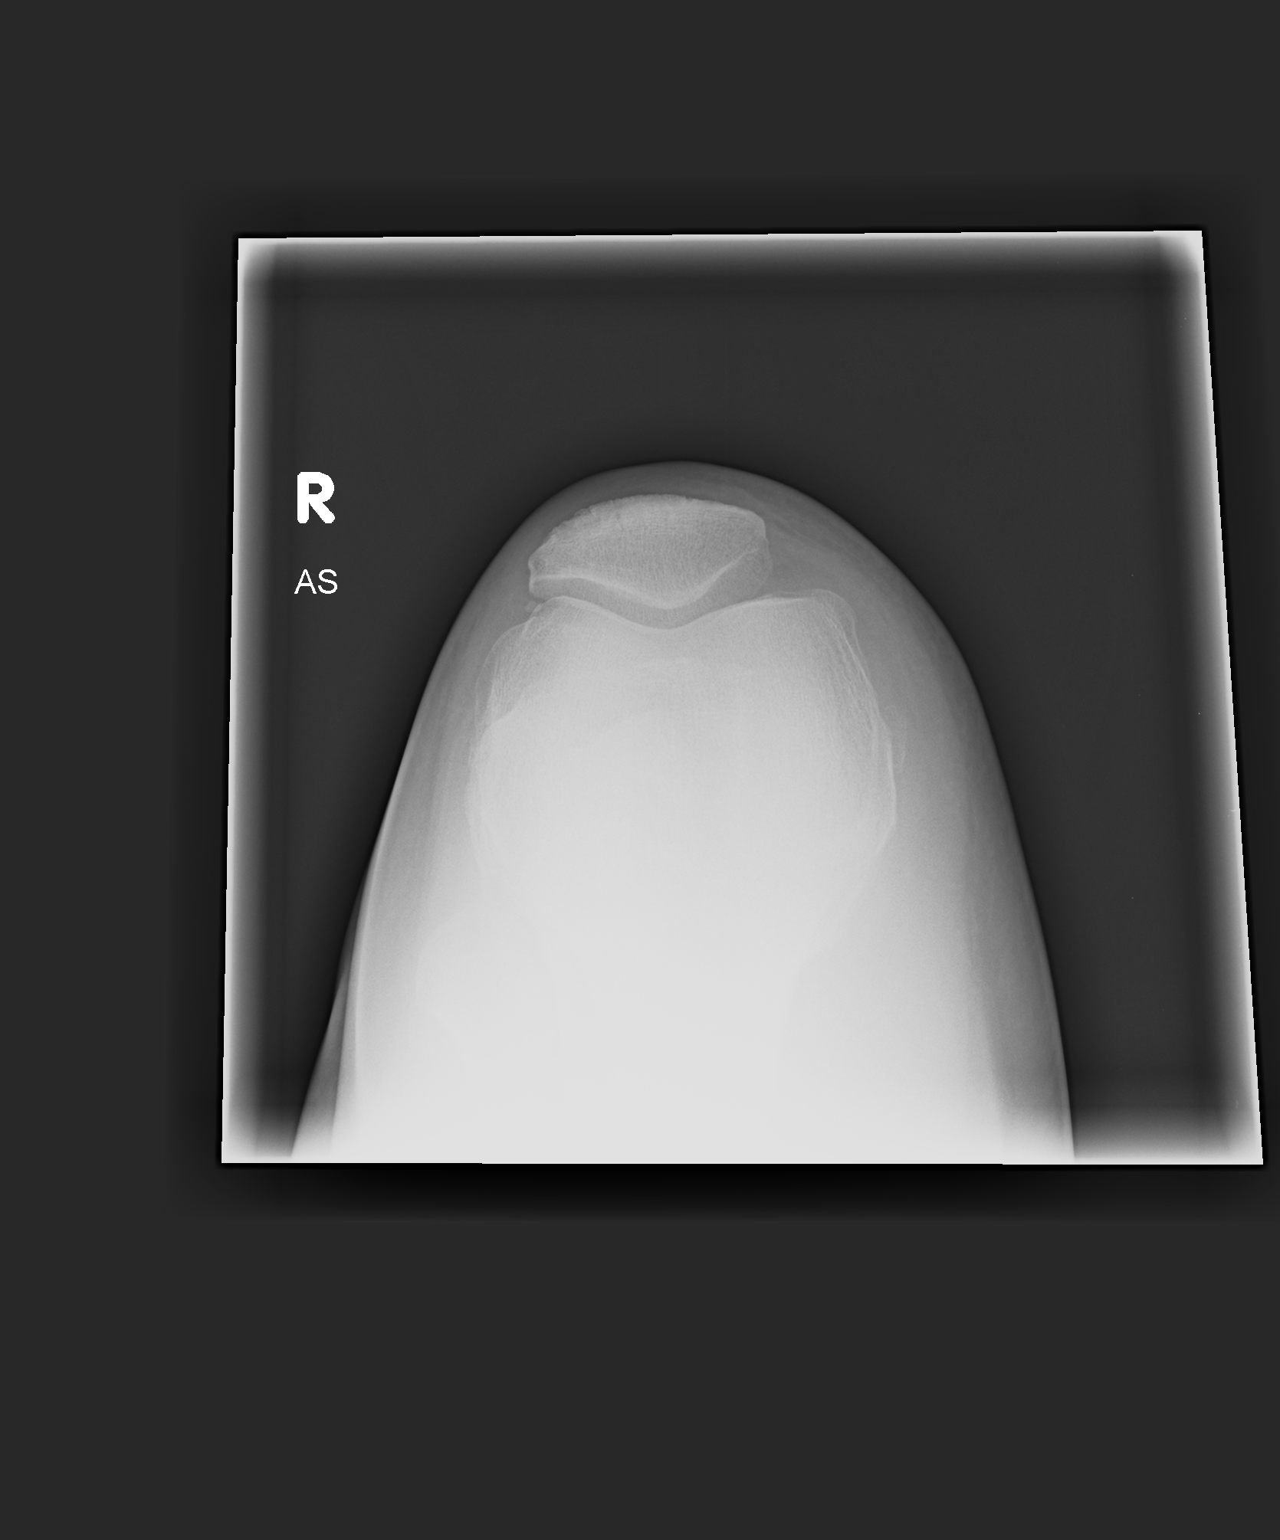

[1 of 1 positions shown; findings below may reference images not displayed]

FINDINGS: Calcification the region the medial collateral ligament consistent
with old injury. No acute bony or joint abnormality identified. Mild
patellofemoral degenerative change. Small loose body cannot be
excluded. No effusion.
IMPRESSION: 1. Calcification in the region the medial collateral ligament
consistent with old injury.

2. Mild patellofemoral degenerative change. Small loose body cannot
be excluded .

## 2017-04-19 IMAGING — CR DG TOE 5TH 2+V*L*
3 series · 3 of 3 positions shown · non-contrast
Comparison: None.

CLINICAL DATA: Left fifth toe pain after injury July 2014.

EXAM:
DG TOE 5TH LEFT

[t toes ap left]
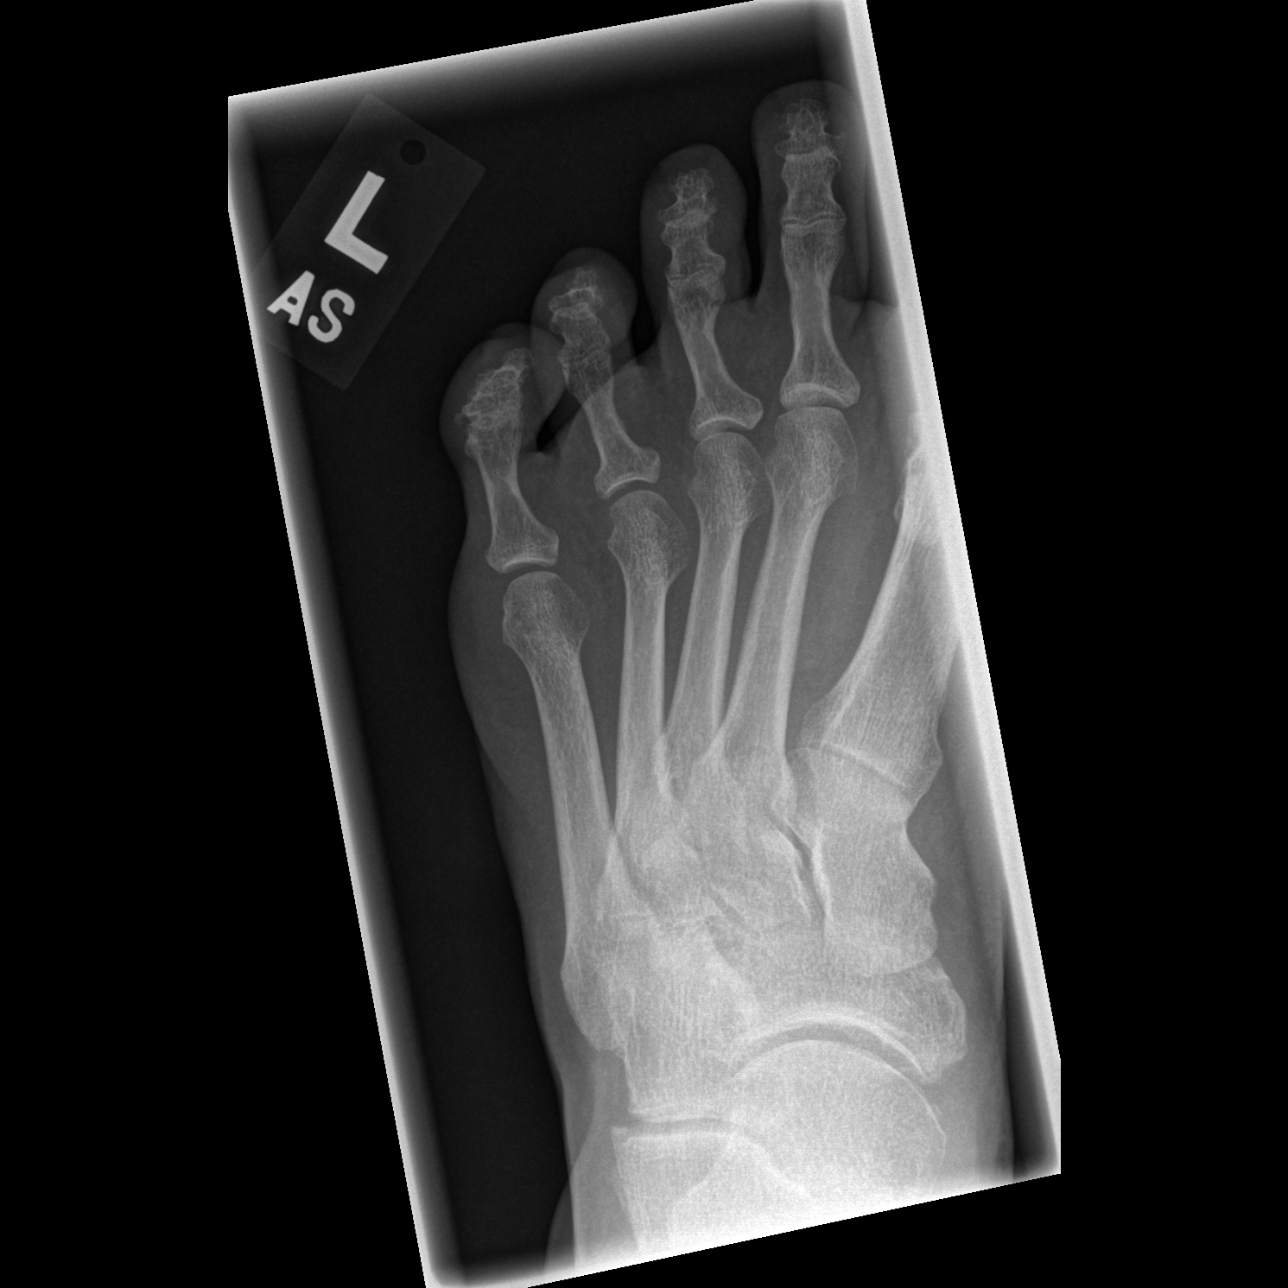

[t toes oblique left]
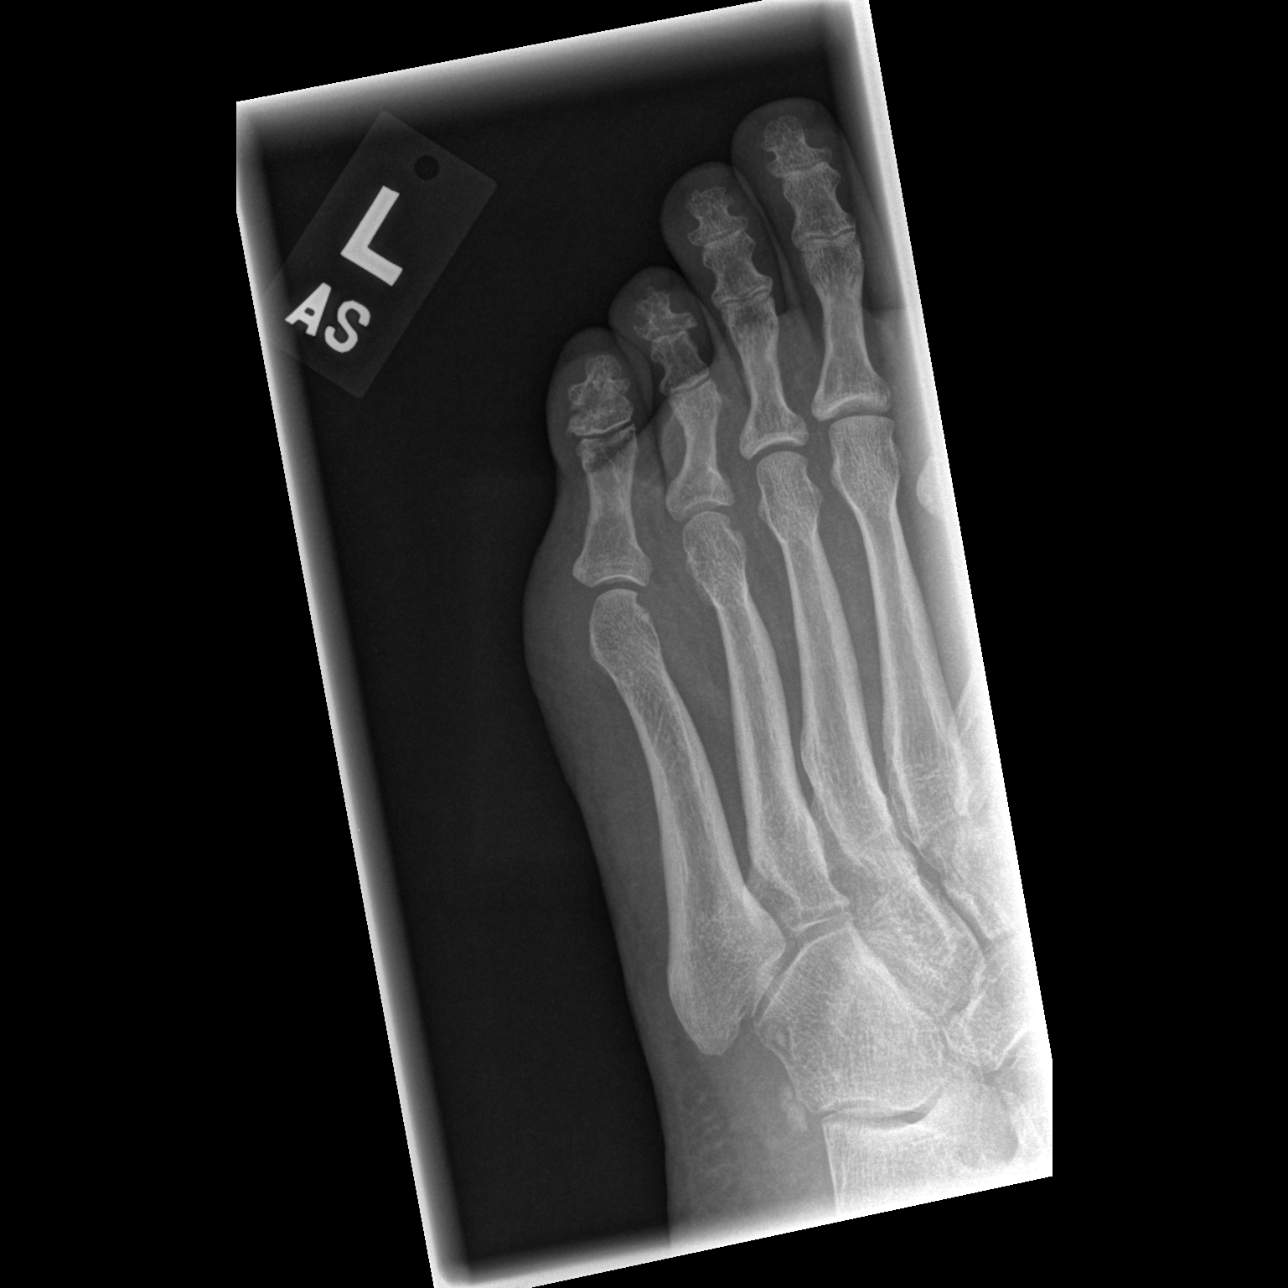

[t toes lateral left]
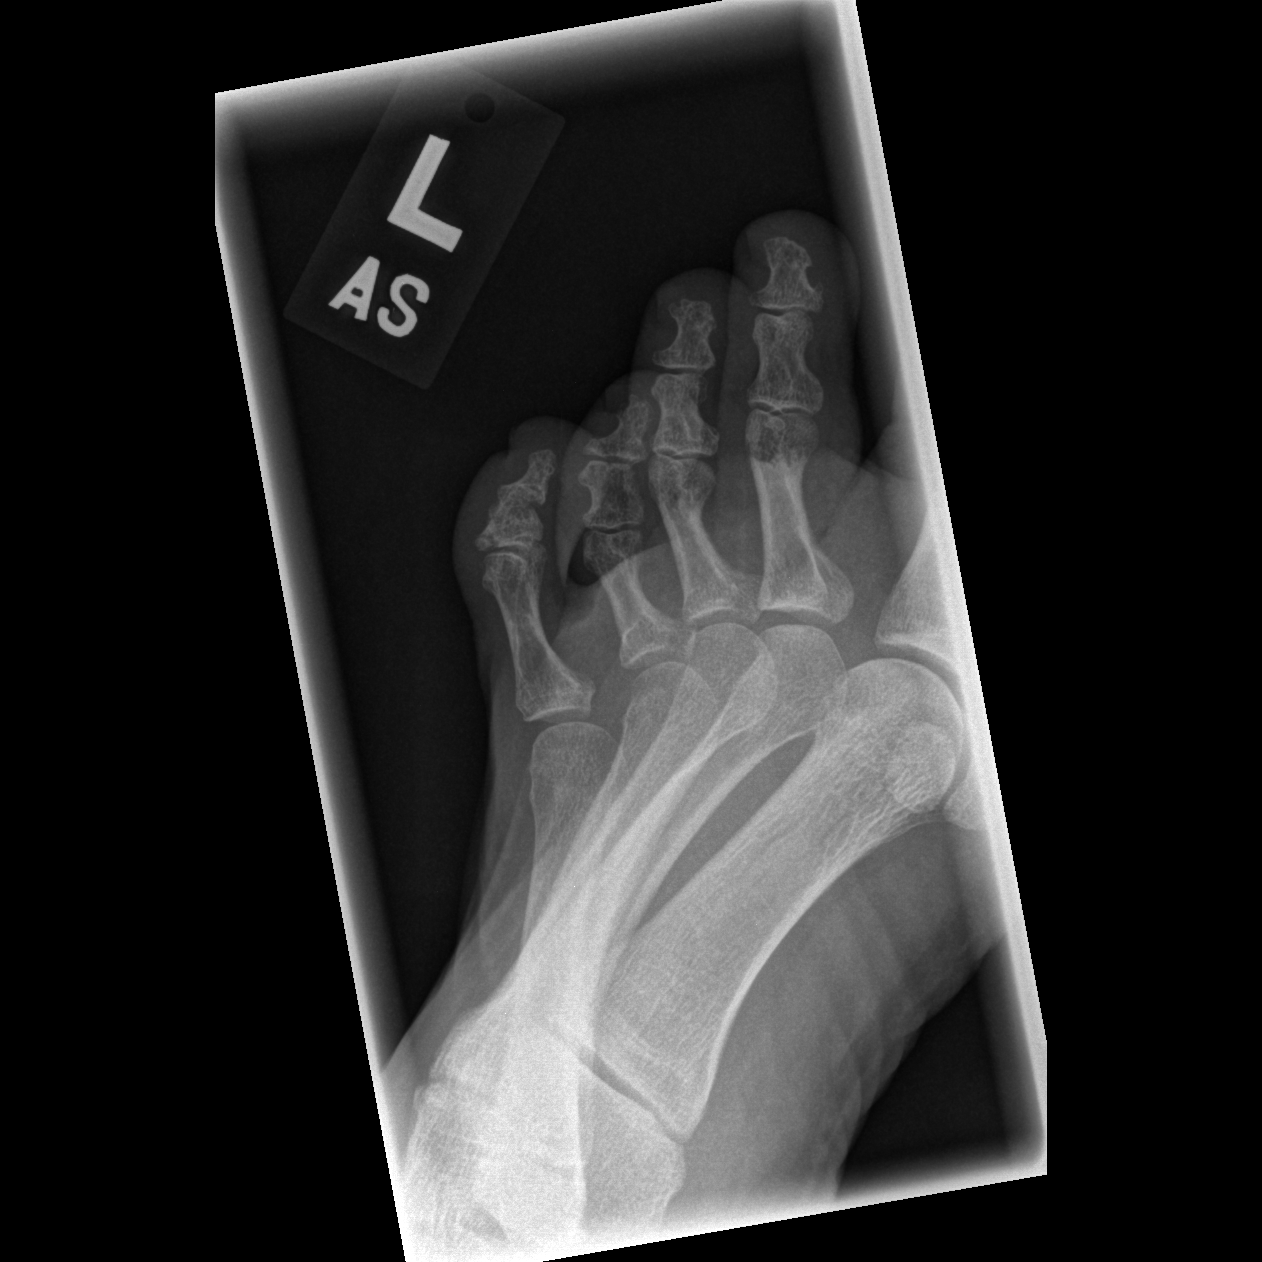

[3 of 3 positions shown; findings below may reference images not displayed]

FINDINGS: There are degenerative changes of the interphalangeal joints. There
is likely a fused fifth middle distal phalanges as there is a small
fragment over the lateral base of the fifth middle phalanx likely
due to old injury. No acute fracture or dislocation.
IMPRESSION: No acute findings.

Tiny fragment adjacent the base of the fifth middle phalanx likely
due to old injury.

Degenerative changes of the interphalangeal joints.

## 2017-05-13 ENCOUNTER — Encounter: Payer: Self-pay | Admitting: Sports Medicine

## 2017-05-13 ENCOUNTER — Ambulatory Visit (INDEPENDENT_AMBULATORY_CARE_PROVIDER_SITE_OTHER): Payer: Medicare Other | Admitting: Sports Medicine

## 2017-05-13 VITALS — BP 104/74 | Ht 65.5 in | Wt 170.0 lb

## 2017-05-13 DIAGNOSIS — M6798 Unspecified disorder of synovium and tendon, other site: Secondary | ICD-10-CM

## 2017-05-13 DIAGNOSIS — M67951 Unspecified disorder of synovium and tendon, right thigh: Secondary | ICD-10-CM

## 2017-05-13 MED ORDER — GABAPENTIN 300 MG PO CAPS: 300.0000 mg | ORAL_CAPSULE | Freq: Every day | ORAL | 1 refills | Status: DC

## 2017-05-13 NOTE — Progress Notes (Signed)
Chief complaint: Left posterior hip and leg pain follow-up 12 months  History of present illness: Shannon [name] Is a 67 year old female who presents to the sports medicine office today for follow-up of left posterior hip and leg pain.  She reports that symptoms have been ongoing for the last 12 months.  She initially presented here for first evaluation last month. She reports that for several months she has been doing chiropractic and massage therapy, doing of both here and in [place], where she spends half of the year.  She is very active with biking, has recently done hiking expeditions to [place] and [place]. At last appointment, she Ultimately diagnosed with gluteus medius syndrome, was started on amitriptyline 25 mg nightly and was given hip abductor exercises to do at home. She reports initially that she was really weak with hip abductors, is only able to do 2 sets of 10 lateral leg raises, has now worked her way up to 3 sets of 15. She reports that she did not really notice much difference with the amitriptyline, other than making her drowsy at nighttime.Today, she reports her pain being 2/10.  With symptoms being ongoing for the last 12 months, she wonders ifthis is all coming from any type of lumbar radiculopathy. Back in December 2000, she did have MRI of her lumbar spine, which did show a small soft disc herniation at L4-5 to the left,, extending into the left neural foramen and compressing the exiting left L4 nerve root.  She also has moderately severe degenerative disc disease at L3-4 with some slight spurring and bulging. 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 She reports that she has a planned trip to [place] and [place] next month.   Review of systems:  As stated above  Interval past medical history, surgical history, family history, and social history obtained and unchanged.  Physical exam: Vital signs are reviewed and are documented in the  chart Gen.: Alert, oriented, appears stated age, in no apparent distress HEENT: Moist oral mucosa Respiratory: Normal respirations, able to speak in full sentences Cardiac: Regular rate, distal pulses 2+ Integumentary: No rashes on visible skin:  Neurologic: She does have weakness with hip abductor, quadriceps, hamstring strength on both sides, sensation intact in bilateral lower extremities Psych: Normal affect, mood is described as good Musculoskeletal: Inspection of left hip reveals no obvious deformity or muscle atrophy, no warmth, erythema, ecchymosis, or effusion, she is tender palpation over the greater trochanter on the left side, she also has some slight tenderness over the piriformis muscle, straight leg negative, FABER and FADIR negative, logroll negative, normal hip internal and external range of motion, she does walk with a slight antalgic gait favoring the right side; Inspection of both of her feet reveal that she does have bilateral pes cavus foot, with toe splaying at toes 1 through 3 bilaterally, with hammering of toes 4-5  Assessment and plan: 1. Left gluteus medius syndrome 2. 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 B/L pes cavus  Plan: Did discuss options with Aleece today to include repeat MRI of her lumbar spine, given the findings noted back in 2000.  Discussed this is mainly to be use for any type of planned surgical intervention.   She does not feel that she is at that point yet, thus decided to hold off on repeat MRI at this time.  Discussed continued  exercises involving her hip abductor, quadriceps, hamstrings.  In regards to the  nerve pain management, will have her started on gabapentin 300 mg nightly. Discussed caution with combination of Lexapro 5 mg daily.  Discussed to monitor for any type of mood changes. We will have her follow-up in 2 weeks, with planned ultrasound at that time,  specifically evaluating her greater trochanteric region. Will plan this prior to her trip to [place] and [place]. Otherwise, will have her follow up sooner as needed.   Greater than 50% of 30 minutes was spent in direct patient coordination of care and counseling regarding the above diagnosis and plan.   [name], M.D. [facility]

## 2017-05-28 ENCOUNTER — Encounter: Payer: Self-pay | Admitting: Family Medicine

## 2017-05-28 ENCOUNTER — Ambulatory Visit (INDEPENDENT_AMBULATORY_CARE_PROVIDER_SITE_OTHER): Payer: Medicare Other | Admitting: Family Medicine

## 2017-05-28 VITALS — BP 98/68 | Ht 65.5 in | Wt 168.0 lb

## 2017-05-28 DIAGNOSIS — M5432 Sciatica, left side: Secondary | ICD-10-CM

## 2017-05-28 DIAGNOSIS — M67952 Unspecified disorder of synovium and tendon, left thigh: Secondary | ICD-10-CM | POA: Diagnosis not present

## 2017-05-28 NOTE — Progress Notes (Signed)
Chief complaint: Follow up of left posterior hip and leg pain x 12 months  History of present illness: Shannon [name] is a 67 year old female who presents to sports medicine office today for follow-up of left posterior hip and leg pain.  In brief review, symptoms have been present for the last 12 months.  She has undergone chiropractic massage therapy, both here and in [place], where she spends half of the year. She is very active with biking, has recently done hiking expeditions to [place] and [place]. She plans to go on a trip to [place] next week.  Has been diagnosed with left gluteus medius syndrome.  She was started on amitriptyline 25 mg nightly was given hip abductor exercises do at home at initial evaluation back on 04/01/17.  If no interval improvement in symptoms, she returned for reevaluation back on 05/13/17.  She was started on gabapentin 300 milligrams nightly and was told to discontinue amitriptyline.  She reports that symptoms have slightly improved in regards to allowing her to sleep at nighttime.  She does feel overall symptoms have slightly worsened with left lateral hip pain.  She reports lateral step ups are causing pain and is now doing 2 sets of 20 with side leg raises.  He does not report of any interval injury or trauma.  She feels pain traveling down the lateral aspect of left lower extremity to her knee.  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  At last appointment, she decided to hold off on repeat MRI.  Today, she questions whether she wants to pursue an MRI of her L-spine given symptoms.  Review of systems:  As stated above  Interval past medical history, surgical history, family history, and social history obtained and unchanged.  Physical exam: Vital signs are reviewed and are documented in the chart Gen.: Alert, oriented, appears stated age, in no apparent distress HEENT:  Moist oral mucosa Respiratory: Normal respirations, able to speak in full sentences Cardiac: Regular rate, distal pulses 2+ Integumentary: No rashes on visible skin:  Neurologic: She continue to have slight weakness with hip abduction and hip flexion on the left side, would categorize strength as 4+/5, otherwise strength 5/5 in bilateral lower extremities, sensation 2+ in bilateral lower extremities Psych: Normal affect, mood is described as good Musculoskeletal: Inspection of left hip reveals no obvious deformity or muscle atrophy, no warmth, erythema, ecchymosis, or effusion, she is tender palpation over the greater trochanter on the left side, she also has some slight tenderness over the piriformis muscle posteriorly and distal tensor fascia latae anteriorly, straight leg negative, FABER and FADIR negative, logroll negative, normal hip internal and external range of motion, she does walk with a slight antalgic gait favoring the right side  Limited musculoskeletal ultrasound was performed in the office today of her left lateral hip. 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 -She does very minimal area of hypoechoic changes seen at the gluteus minimus and gluteus medius insertion on the greater trochanter -She does have slight hypoechoic changes noted on the tensor fascia lata  Impression: Hypoechoic changes of gluteus medius and minimus consistent with gluteus medius syndrome  Interpreted By: [name], MD   Assessment and plan: 1. Left gluteus medius syndrome 2. 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 B/L pes cavus  Plan: Reviewed  ultrasound with Shannon [name] today. Discussed ultrasound fits symptoms consistent with gluteus medius syndrome. Discussed continue with HEP at home and working with physical therapy. Discussed holding off on lateral step ups if it causing her pain, but to continue with the other  exercises. Discussed option of formal referral to Shannon [name] after she returns from [place]. I discussed to have her continue with the gabapentin 300 mg nightly. She reports to me that after thinking about MRI, she would like to have repeat MRI of her L-spine done, preferentially before she leaves next week. Do feel this is very reasonable option given her MRI L-spine history and chronicity of symptoms. Fortunately, we were able to get this set up prior to her trip next week. Will call her after MRI results. Will plan to have her return to office after her trip to [place] or sooner as needed.   [name], M.D. [facility]

## 2017-05-30 ENCOUNTER — Ambulatory Visit
Admission: RE | Admit: 2017-05-30 | Discharge: 2017-05-30 | Disposition: A | Discharge status: Home / Self Care | Payer: Medicare Other | Source: Ambulatory Visit | Attending: Family Medicine | Admitting: Family Medicine

## 2017-05-30 DIAGNOSIS — M5432 Sciatica, left side: Secondary | ICD-10-CM

## 2017-05-30 DIAGNOSIS — M48061 Spinal stenosis, lumbar region without neurogenic claudication: Secondary | ICD-10-CM | POA: Diagnosis not present

## 2017-05-30 DIAGNOSIS — M67952 Unspecified disorder of synovium and tendon, left thigh: Secondary | ICD-10-CM

## 2017-06-02 DIAGNOSIS — Z803 Family history of malignant neoplasm of breast: Secondary | ICD-10-CM | POA: Diagnosis not present

## 2017-06-02 DIAGNOSIS — Z1231 Encounter for screening mammogram for malignant neoplasm of breast: Secondary | ICD-10-CM | POA: Diagnosis not present

## 2017-06-04 ENCOUNTER — Encounter: Payer: Self-pay | Admitting: Family Medicine

## 2017-06-04 ENCOUNTER — Ambulatory Visit (INDEPENDENT_AMBULATORY_CARE_PROVIDER_SITE_OTHER): Payer: Medicare Other | Admitting: Family Medicine

## 2017-06-04 VITALS — BP 104/74 | Ht 65.5 in | Wt 167.0 lb

## 2017-06-04 DIAGNOSIS — E039 Hypothyroidism, unspecified: Secondary | ICD-10-CM | POA: Diagnosis not present

## 2017-06-04 DIAGNOSIS — M5137 Other intervertebral disc degeneration, lumbosacral region: Secondary | ICD-10-CM

## 2017-06-04 MED ORDER — GABAPENTIN 300 MG PO CAPS: 300.0000 mg | ORAL_CAPSULE | Freq: Three times a day (TID) | ORAL | 0 refills | Status: DC | PRN

## 2017-06-04 NOTE — Progress Notes (Signed)
Chief complaint: Review MRI results of her L-spine for left lower back, anterior, and posterior hip pain x 13 months  History of present illness: Shannon [name] is a 67 year old female who presents to the sports medicine office today for review of her lumbar spine MRI results.  She has seen Dr. Oneida Alar and myself a few times previously for left gluteus medius syndrome.  I did see her last week for follow-up of this pain.  Ultrasound at that time did show hypoechoic changes of the gluteus medius and minimus consistent with gluteus medius syndrome.  Discussed option of getting an MRI of her lumbar spine due to her history of congenital L1-L2 fusion and her having pain most recently in her left anterior hip.  She was agreeable to this, especially given the fact that she is going to [place] tomorrow.  She reports that her hip pain has improved slightly.  She is still using the gabapentin 300 mg nightly.  She reports that she does use occasionally.  No interval injury or trauma.  Review of systems:  As stated above  Interval past medical history, surgical history, family history, and social history obtained and unchanged.  Physical exam: Vital signs are reviewed and are documented in the chart Gen.: Alert, oriented, appears stated age, in no apparent distress HEENT: Moist oral mucosa Respiratory: Normal respirations, able to speak in full sentences Cardiac: Regular rate, distal pulses 2+ Integumentary: No rashes on visible skin:  Neurologic: She continues to have slight weakness with hip abduction and hip flexion on the left side, would categorize strength as 4+/5, otherwise strength 5/5 in bilateral lower extremities, sensation 2+ in bilateral lower extremities Psych: Normal affect, mood is described as good Musculoskeletal: Inspection of left hip reveals no obvious deformity or muscle atrophy, no warmth, erythema, ecchymosis, or effusion, she is tender palpation over the greater trochanter on the left side,  she also has some slight tenderness over the piriformis muscle posteriorly and distal tensor fascia latae anteriorly, full hip IR and ER  MRI of her lumbar spine without contrast was performed on 05/30/17.  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  There is moderate spinal stenosis in multiple areas of the lumbar spine, particularly at L2-3 and L4-5.  It also shows multiple areas of severe facet arthropathy, most notable at L4-L5 and L5-S1.  Assessment and plan: 1. Left gluteus medius syndrome 2.  Multilevel degenerative lumbar disc disease, moderate spinal stenosis at L2-L3 and L4-L5, with suspected clinical symptoms of nerve root impingement at L2 and L4 3.  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 B/L pes cavus  Plan: Reviewed MRI results with Shannon [name] in the office today.  Discussed key findings of the MRI as noted above.  I do think she has a few pain generators that are causing causing her left hip and leg pain.  Do feel that given the nerve root impingement suspected at L2 and L4 this would go along partly with the pain that she is having her left anterior hip as well as going down the side.  She is currently on gabapentin 300 mg nightly.  While on her trip, discussed that she can increase this to 300 mg 3 times daily. New prescription was given for her today.  She already has appointment scheduled to see Dr. Oneida Alar when she returns.  At that time, discussed having her being started with formal  physical therapy with Shannon [name].  I do not necessarily feel at this time she needs to urgently see an orthopedic spine surgeon.  This is an option if she wants a second opinion evaluation.  Greater than 50% of 15 minutes was spent in direct, face-to-face discussion with patient, reviewing the MRI results, counseling, and coordination of care in light of the  MRI results.   [name], M.D. [facility]

## 2017-07-14 DIAGNOSIS — H5213 Myopia, bilateral: Secondary | ICD-10-CM | POA: Diagnosis not present

## 2017-07-14 DIAGNOSIS — H02833 Dermatochalasis of right eye, unspecified eyelid: Secondary | ICD-10-CM | POA: Diagnosis not present

## 2017-07-14 DIAGNOSIS — H02836 Dermatochalasis of left eye, unspecified eyelid: Secondary | ICD-10-CM | POA: Diagnosis not present

## 2017-07-14 DIAGNOSIS — H43393 Other vitreous opacities, bilateral: Secondary | ICD-10-CM | POA: Diagnosis not present

## 2017-07-14 DIAGNOSIS — H52203 Unspecified astigmatism, bilateral: Secondary | ICD-10-CM | POA: Diagnosis not present

## 2017-07-14 DIAGNOSIS — H25813 Combined forms of age-related cataract, bilateral: Secondary | ICD-10-CM | POA: Diagnosis not present

## 2017-07-14 DIAGNOSIS — H524 Presbyopia: Secondary | ICD-10-CM | POA: Diagnosis not present

## 2017-07-15 ENCOUNTER — Encounter: Payer: Self-pay | Admitting: Sports Medicine

## 2017-07-15 ENCOUNTER — Ambulatory Visit (INDEPENDENT_AMBULATORY_CARE_PROVIDER_SITE_OTHER): Payer: Medicare Other | Admitting: Sports Medicine

## 2017-07-15 VITALS — BP 102/70 | Ht 65.5 in | Wt 172.0 lb

## 2017-07-15 DIAGNOSIS — M67952 Unspecified disorder of synovium and tendon, left thigh: Secondary | ICD-10-CM

## 2017-07-15 DIAGNOSIS — M5137 Other intervertebral disc degeneration, lumbosacral region: Secondary | ICD-10-CM

## 2017-07-15 DIAGNOSIS — M5136 Other intervertebral disc degeneration, lumbar region: Secondary | ICD-10-CM | POA: Diagnosis not present

## 2017-07-15 MED ORDER — GABAPENTIN 300 MG PO CAPS: 300.0000 mg | ORAL_CAPSULE | Freq: Three times a day (TID) | ORAL | 5 refills | Status: DC

## 2017-07-15 NOTE — Assessment & Plan Note (Signed)
We will ask her to increase frequency of gabapentin to bid to tid 300 mgm Use based on amount of radiating sxs  Trial of PT to work on a good HEP

## 2017-07-15 NOTE — Progress Notes (Signed)
CC; F/U of left hip pain   Patient has hx of left and rt glut med syndrome We did MRI recently and found lots of degenerative and some congential change in lumbar spine Gabapentin 300 hs helped w pain Did a trip to [place] and had more pain with travel Increase in gabapentin to bid or tid really helped  Also beneficial was massage She feels stronger and has done HEP Hip abduction exercise makes pain less as well  ROS Pain still radiates to lat left knee at times Yoga positions are pain free but sometimes cause soreness later No weakness located in legs Biking lessens pain but gets delayed onset pain  PE Pleasant F in NAD BP 102/70   Ht 5' 5.5" (1.664 m)   Wt 172 lb (78 kg)   LMP 03/29/2004   BMI 28.19 kg/m   Full ROM of left hip SLR neg bilat Able to do planks no pain Able to do back bridge no pain  Tight in left lean Only 20 deg of back extension Some lumbar scoliosis  Strength has returned to normal on hip abduction and other tests

## 2017-07-15 NOTE — Assessment & Plan Note (Signed)
By testing she has regained the strength Now with some pain at times but much less  tendinopathy likely resolved

## 2017-07-28 DIAGNOSIS — M545 Low back pain: Secondary | ICD-10-CM | POA: Diagnosis not present

## 2017-08-07 DIAGNOSIS — M545 Low back pain: Secondary | ICD-10-CM | POA: Diagnosis not present

## 2017-08-08 ENCOUNTER — Telehealth: Payer: Self-pay | Admitting: Family Medicine

## 2017-08-08 NOTE — Telephone Encounter (Signed)
Called patients cell phone, phone did not ring. Called patients house number left message letting patient know that we can not order labs for another facility unless [facility] on [address].  She can come in and receive lab orders written on a rx pad and take where she would like.

## 2017-08-08 NOTE — Telephone Encounter (Signed)
Copied from [place] [id number] [phone]. Topic: Quick Communication - See Telephone Encounter >> Aug 08, 2017  9:30 AM Robina Ade, Helene Kelp D wrote: CRM for notification. See Telephone encounter for: 08/08/17. Patient called and would like an order to be put in for her to lab TSH, T3 & T4 done at [facility] on [address]. Please call patient back.

## 2017-08-14 DIAGNOSIS — M545 Low back pain: Secondary | ICD-10-CM | POA: Diagnosis not present

## 2017-08-18 ENCOUNTER — Encounter: Payer: Self-pay | Admitting: Family Medicine

## 2017-08-18 ENCOUNTER — Other Ambulatory Visit: Payer: Medicare Other

## 2017-08-18 DIAGNOSIS — E039 Hypothyroidism, unspecified: Secondary | ICD-10-CM

## 2017-08-19 ENCOUNTER — Other Ambulatory Visit: Payer: Medicare Other

## 2017-08-19 DIAGNOSIS — M47816 Spondylosis without myelopathy or radiculopathy, lumbar region: Secondary | ICD-10-CM | POA: Diagnosis not present

## 2017-08-19 DIAGNOSIS — M549 Dorsalgia, unspecified: Secondary | ICD-10-CM | POA: Diagnosis not present

## 2017-08-19 DIAGNOSIS — M5134 Other intervertebral disc degeneration, thoracic region: Secondary | ICD-10-CM | POA: Diagnosis not present

## 2017-08-19 DIAGNOSIS — M5136 Other intervertebral disc degeneration, lumbar region: Secondary | ICD-10-CM | POA: Diagnosis not present

## 2017-08-19 DIAGNOSIS — M4155 Other secondary scoliosis, thoracolumbar region: Secondary | ICD-10-CM | POA: Diagnosis not present

## 2017-08-19 DIAGNOSIS — Q761 Klippel-Feil syndrome: Secondary | ICD-10-CM | POA: Diagnosis not present

## 2017-08-19 DIAGNOSIS — M546 Pain in thoracic spine: Secondary | ICD-10-CM | POA: Diagnosis not present

## 2017-08-19 DIAGNOSIS — Z6828 Body mass index (BMI) 28.0-28.9, adult: Secondary | ICD-10-CM | POA: Diagnosis not present

## 2017-08-19 DIAGNOSIS — M47814 Spondylosis without myelopathy or radiculopathy, thoracic region: Secondary | ICD-10-CM | POA: Diagnosis not present

## 2017-08-19 DIAGNOSIS — M5416 Radiculopathy, lumbar region: Secondary | ICD-10-CM | POA: Diagnosis not present

## 2017-08-20 DIAGNOSIS — H02831 Dermatochalasis of right upper eyelid: Secondary | ICD-10-CM | POA: Diagnosis not present

## 2017-08-20 DIAGNOSIS — H02834 Dermatochalasis of left upper eyelid: Secondary | ICD-10-CM | POA: Diagnosis not present

## 2017-08-21 DIAGNOSIS — M545 Low back pain: Secondary | ICD-10-CM | POA: Diagnosis not present

## 2017-08-21 DIAGNOSIS — E039 Hypothyroidism, unspecified: Secondary | ICD-10-CM | POA: Diagnosis not present

## 2017-08-25 ENCOUNTER — Ambulatory Visit (INDEPENDENT_AMBULATORY_CARE_PROVIDER_SITE_OTHER): Payer: Medicare Other | Admitting: Family

## 2017-08-25 ENCOUNTER — Encounter: Payer: Self-pay | Admitting: Family

## 2017-08-25 VITALS — BP 120/72 | HR 79 | Temp 99.7°F | Wt 171.0 lb

## 2017-08-25 DIAGNOSIS — J209 Acute bronchitis, unspecified: Secondary | ICD-10-CM | POA: Diagnosis not present

## 2017-08-25 MED ORDER — ALBUTEROL SULFATE HFA 108 (90 BASE) MCG/ACT IN AERS: 2.0000 | INHALATION_SPRAY | Freq: Four times a day (QID) | RESPIRATORY_TRACT | 0 refills | Status: DC | PRN

## 2017-08-25 MED ORDER — FLUTICASONE PROPIONATE 50 MCG/ACT NA SUSP: 2.0000 | Freq: Every day | NASAL | 6 refills | Status: DC

## 2017-08-25 MED ORDER — CEFDINIR 300 MG PO CAPS: 300.0000 mg | ORAL_CAPSULE | Freq: Two times a day (BID) | ORAL | 0 refills | Status: DC

## 2017-08-25 NOTE — Progress Notes (Signed)
Shannon [name] is a 67 y.o. female with the following history as recorded in EpicCare:  Patient Active Problem List   Diagnosis Date Noted  . 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  . Hyperglycemia 02/24/2017  . Lumbar degenerative disc disease 02/24/2017  . Urinary frequency 08/05/2016  . Medicare welcome exam 03/25/2016  . Hyperlipidemia, mixed 03/11/2016  . Melanoma of skin ([place]) 03/11/2016  . BMI 29.0-29.9,adult 03/13/2015  . Vitamin D deficiency 02/28/2014  . Hypothyroidism 03/08/2013  . Adjustment disorder with mixed anxiety and depressed mood 03/08/2013  . Nuclear sclerotic cataract of both eyes 07/23/2012  . Knee pain 09/26/2010  . HAMMER TOE, ACQUIRED 03/15/2009    Current Outpatient Medications  Medication Sig Dispense Refill  . Cholecalciferol (VITAMIN D) 2000 UNITS CAPS Take by mouth 2 (two) times daily.     [name] escitalopram (LEXAPRO) 5 MG tablet Take 1 tablet (5 mg total) by mouth daily. 90 tablet 1  . gabapentin (NEURONTIN) 300 MG capsule Take 1 capsule (300 mg total) by mouth 3 (three) times daily. 90 capsule 5  . GAMMA AMINOBUTYRIC ACID PO Take 200 mg by mouth as needed. Up to bid    . hydrocortisone (PROCTOZONE-HC) 2.5 % rectal cream Place 1 application rectally 2 (two) times daily. 30 g 0  . Krill Oil 500 MG CAPS     . levothyroxine (SYNTHROID, LEVOTHROID) 50 MCG tablet Take 1 tablet (50 mcg total) by mouth daily. 90 tablet 1  . LORazepam (ATIVAN) 2 MG tablet Take 1 tablet (2 mg total) by mouth every 8 (eight) hours as needed for anxiety. 90 tablet 0  . MAGNESIUM GLYCINATE PLUS PO Take 100 mg by mouth 2 (two) times daily.    [name] OVER THE COUNTER MEDICATION Influenzinum 30x   4 pellets 1 time a week.    . thyroid (ARMOUR THYROID) 15 MG tablet Take by mouth. 7.5 mg qd    . albuterol (PROVENTIL HFA;VENTOLIN HFA) 108 (90 Base) MCG/ACT inhaler Inhale 2 puffs into the lungs every 6 (six) hours as needed for wheezing or shortness of breath. 1 Inhaler 0  . amitriptyline  (ELAVIL) 25 MG tablet Take 1 tablet (25 mg total) by mouth at bedtime. (Patient not taking: Reported on 05/28/2017) 30 tablet 1  . cefdinir (OMNICEF) 300 MG capsule Take 1 capsule (300 mg total) by mouth 2 (two) times daily. 20 capsule 0  . fluticasone (FLONASE) 50 MCG/ACT nasal spray Place 2 sprays into both nostrils daily. 16 g 6  . Probiotic Product (PROBIOTIC PO) Take 1 capsule by mouth daily.       No current facility-administered medications for this visit.     Allergies: Hydrocodone-acetaminophen; Hydrocodone-acetaminophen; Gluten meal; and Oxycodone  Past Medical History:  Diagnosis Date  . ADD (attention deficit disorder with hyperactivity)   . Anxiety   . Asthma   . Cancer ([place])    melanoma- lip 2011  . Depression   . Diverticulosis   . Hyperlipidemia, mixed 03/11/2016  . Hypothyroidism   . Menopause   . Osteopenia   . Rosacea   . Sciatica of left side 02/24/2017  . Travel advice encounter 03/25/2016    Past Surgical History:  Procedure Laterality Date  . blephoroplasty    . BROW LIFT    . DILATION AND CURETTAGE OF UTERUS    . SKIN CANCER EXCISION     right lower lip  . TONSILLECTOMY AND ADENOIDECTOMY      Family History  Problem Relation Age of Onset  .  Colon polyps Mother   . Parkinson's disease Mother   . Diabetes Father   . Emphysema Father   . Tuberculosis Father   . Colon cancer Maternal Grandfather   . Alcohol abuse Sister   . Cancer Sister        skin cancer, melanoma cell, basil cell  . ADD / ADHD Daughter   . Anxiety disorder Daughter   . Depression Daughter   . ADD / ADHD Son   . Anxiety disorder Son   . Depression Son   . Cancer Sister        melanoma  . Anxiety disorder Brother     Social History   Tobacco Use  . Smoking status: Never Smoker  . Smokeless tobacco: Never Used  Substance Use Topics  . Alcohol use: Yes    Alcohol/week: 1.8 - 2.4 oz    Types: 1 - 2 Glasses of wine, 1 Cans of beer, 1 Shots of liquor per week    Comment:  rare    Subjective:  Patient presents with worsening cough/ congestion; started at the end of last week; heard herself wheezing last night; not prone to asthma/ allergies; no OTC medications; leaving for [place] today- lives there 6 months out of the year; running a low grade fever;  Has had problems with vertigo for the past 6 weeks- seemed to start after flying; feels "off balance." Does not describe as sensation as the room is spinning around her; worse with movement;   Objective:  Vitals:   08/25/17 0819  BP: 120/72  Pulse: 79  Temp: 99.7 F (37.6 C)  SpO2: 98%  Weight: 171 lb (77.6 kg)    General: Well developed, well nourished, in no acute distress  Skin : Warm and dry.  Head: Normocephalic and atraumatic  Eyes: Sclera and conjunctiva clear; pupils round and reactive to light; extraocular movements intact  Ears: External normal; canals clear; tympanic membranes normal  Oropharynx: Pink, supple. No suspicious lesions  Neck: Supple without thyromegaly, adenopathy  Lungs: Respirations unlabored; clear to auscultation bilaterally without wheeze, rales, rhonchi  CVS exam: normal rate and regular rhythm.  Neurologic: Alert and oriented; speech intact; face symmetrical; moves all extremities well; CNII-XII intact without focal deficit  Assessment:  1. Acute bronchitis, unspecified organism     Plan:  Rx for Flonase and albuterol; increase fluids, rest; Rx for Omnicef 300 mg bid x 10 days- hold and fill if worsening;  She is leaving for [place] today for the next 6 months- follow-up with provider there as needed until she returns to [facility];   No follow-ups on file.  No orders of the defined types were placed in this encounter.   Requested Prescriptions   Signed Prescriptions Disp Refills  . fluticasone (FLONASE) 50 MCG/ACT nasal spray 16 g 6    Sig: Place 2 sprays into both nostrils daily.  [name] albuterol (PROVENTIL HFA;VENTOLIN HFA) 108 (90 Base) MCG/ACT inhaler 1 Inhaler 0    Sig:  Inhale 2 puffs into the lungs every 6 (six) hours as needed for wheezing or shortness of breath.  . cefdinir (OMNICEF) 300 MG capsule 20 capsule 0    Sig: Take 1 capsule (300 mg total) by mouth 2 (two) times daily.

## 2017-11-17 DIAGNOSIS — E039 Hypothyroidism, unspecified: Secondary | ICD-10-CM | POA: Diagnosis not present

## 2017-11-19 DIAGNOSIS — M9902 Segmental and somatic dysfunction of thoracic region: Secondary | ICD-10-CM | POA: Diagnosis not present

## 2017-11-19 DIAGNOSIS — M5414 Radiculopathy, thoracic region: Secondary | ICD-10-CM | POA: Diagnosis not present

## 2017-12-03 ENCOUNTER — Telehealth: Payer: Self-pay | Admitting: Family Medicine

## 2017-12-03 MED ORDER — ESCITALOPRAM OXALATE 5 MG PO TABS: 5.0000 mg | ORAL_TABLET | Freq: Every day | ORAL | 1 refills | Status: DC

## 2017-12-03 NOTE — Telephone Encounter (Signed)
Copied from [place] [id number]. Topic: Quick Communication - Rx Refill/Question >> Dec 03, 2017 10:42 AM Rutherford Nail, NT wrote: **Patient is in [place] for the summer and states that she is leaving on Monday(12/08/17) to go to [place] for a month. Please advise.**  Medication: escitalopram (LEXAPRO) 5 MG tablet   Has the patient contacted their pharmacy? Yes.   (Agent: If no, request that the patient contact the pharmacy for the refill.) (Agent: If yes, when and what did the pharmacy advise?)  Preferred Pharmacy (with phone number or street name): [id number] - [place], [place]: Please be advised that RX refills [place] take up to 3 business days. We ask that you follow-up with your pharmacy.

## 2018-01-07 DIAGNOSIS — E039 Hypothyroidism, unspecified: Secondary | ICD-10-CM | POA: Diagnosis not present

## 2018-01-07 DIAGNOSIS — E349 Endocrine disorder, unspecified: Secondary | ICD-10-CM | POA: Diagnosis not present

## 2018-01-07 DIAGNOSIS — E559 Vitamin D deficiency, unspecified: Secondary | ICD-10-CM | POA: Diagnosis not present

## 2018-01-09 DIAGNOSIS — M5412 Radiculopathy, cervical region: Secondary | ICD-10-CM | POA: Diagnosis not present

## 2018-01-09 DIAGNOSIS — M9901 Segmental and somatic dysfunction of cervical region: Secondary | ICD-10-CM | POA: Diagnosis not present

## 2018-02-02 DIAGNOSIS — M5412 Radiculopathy, cervical region: Secondary | ICD-10-CM | POA: Diagnosis not present

## 2018-02-02 DIAGNOSIS — M9901 Segmental and somatic dysfunction of cervical region: Secondary | ICD-10-CM | POA: Diagnosis not present

## 2018-03-02 ENCOUNTER — Ambulatory Visit (INDEPENDENT_AMBULATORY_CARE_PROVIDER_SITE_OTHER): Payer: Medicare Other | Admitting: Family Medicine

## 2018-03-02 ENCOUNTER — Encounter: Payer: Self-pay | Admitting: Family Medicine

## 2018-03-02 VITALS — BP 106/64 | HR 58 | Temp 97.9°F | Resp 16 | Ht 65.5 in | Wt 176.0 lb

## 2018-03-02 DIAGNOSIS — E039 Hypothyroidism, unspecified: Secondary | ICD-10-CM

## 2018-03-02 DIAGNOSIS — Z77011 Contact with and (suspected) exposure to lead: Secondary | ICD-10-CM | POA: Diagnosis not present

## 2018-03-02 DIAGNOSIS — E782 Mixed hyperlipidemia: Secondary | ICD-10-CM | POA: Diagnosis not present

## 2018-03-02 DIAGNOSIS — E559 Vitamin D deficiency, unspecified: Secondary | ICD-10-CM

## 2018-03-02 DIAGNOSIS — K9041 Non-celiac gluten sensitivity: Secondary | ICD-10-CM

## 2018-03-02 DIAGNOSIS — Z79899 Other long term (current) drug therapy: Secondary | ICD-10-CM

## 2018-03-02 DIAGNOSIS — Z0000 Encounter for general adult medical examination without abnormal findings: Secondary | ICD-10-CM

## 2018-03-02 DIAGNOSIS — R011 Cardiac murmur, unspecified: Secondary | ICD-10-CM

## 2018-03-02 DIAGNOSIS — F4322 Adjustment disorder with anxiety: Secondary | ICD-10-CM

## 2018-03-02 DIAGNOSIS — R739 Hyperglycemia, unspecified: Secondary | ICD-10-CM | POA: Diagnosis not present

## 2018-03-02 DIAGNOSIS — Z6829 Body mass index (BMI) 29.0-29.9, adult: Secondary | ICD-10-CM

## 2018-03-02 DIAGNOSIS — F4323 Adjustment disorder with mixed anxiety and depressed mood: Secondary | ICD-10-CM

## 2018-03-02 DIAGNOSIS — C439 Malignant melanoma of skin, unspecified: Secondary | ICD-10-CM

## 2018-03-02 DIAGNOSIS — Z23 Encounter for immunization: Secondary | ICD-10-CM

## 2018-03-02 HISTORY — DX: Contact with and (suspected) exposure to lead: Z77.011

## 2018-03-02 HISTORY — DX: Cardiac murmur, unspecified: R01.1

## 2018-03-02 LAB — VITAMIN D 25 HYDROXY (VIT D DEFICIENCY, FRACTURES): VITD: 68.89 ng/mL (ref 30.00–100.00)

## 2018-03-02 LAB — CBC
HEMATOCRIT: 41.3 % (ref 36.0–46.0)
HEMOGLOBIN: 14.1 g/dL (ref 12.0–15.0)
MCHC: 34 g/dL (ref 30.0–36.0)
MCV: 97.2 fl (ref 78.0–100.0)
Platelets: 229 10*3/uL (ref 150.0–400.0)
RBC: 4.24 Mil/uL (ref 3.87–5.11)
RDW: 12.3 % (ref 11.5–15.5)
WBC: 4.1 10*3/uL (ref 4.0–10.5)

## 2018-03-02 LAB — COMPREHENSIVE METABOLIC PANEL
ALT: 15 U/L (ref 0–35)
AST: 22 U/L (ref 0–37)
Albumin: 4.5 g/dL (ref 3.5–5.2)
Alkaline Phosphatase: 61 U/L (ref 39–117)
BILIRUBIN TOTAL: 1 mg/dL (ref 0.2–1.2)
BUN: 16 mg/dL (ref 6–23)
CALCIUM: 9.7 mg/dL (ref 8.4–10.5)
CHLORIDE: 104 meq/L (ref 96–112)
CO2: 29 meq/L (ref 19–32)
Creatinine, Ser: 0.87 mg/dL (ref 0.40–1.20)
GFR: 68.93 mL/min (ref >60.00)
Glucose, Bld: 88 mg/dL (ref 70–99)
Potassium: 4.4 mEq/L (ref 3.5–5.1)
SODIUM: 140 meq/L (ref 135–145)
TOTAL PROTEIN: 6.6 g/dL (ref 6.0–8.3)

## 2018-03-02 LAB — HEMOGLOBIN A1C: Hgb A1c MFr Bld: 5.3 % (ref 4.6–6.5)

## 2018-03-02 LAB — LIPID PANEL
CHOL/HDL RATIO: 3
Cholesterol: 188 mg/dL (ref 0–200)
HDL: 57.3 mg/dL (ref >39.00)
LDL CALC: 115 mg/dL — AB (ref 0–99)
NonHDL: 130.61
TRIGLYCERIDES: 79 mg/dL (ref 0.0–149.0)
VLDL: 15.8 mg/dL (ref 0.0–40.0)

## 2018-03-02 MED ORDER — LORAZEPAM 2 MG PO TABS: 2.0000 mg | ORAL_TABLET | Freq: Three times a day (TID) | ORAL | 0 refills | Status: DC | PRN

## 2018-03-02 NOTE — Progress Notes (Signed)
Subjective:    Patient ID: Shannon [name], female    DOB: 27-Oct-1950, 67 y.o.   MRN: [id number]  Chief Complaint  Patient presents with  . Annual Exam    HPI Patient is in today for  Annual preventative exam and follow-up on chronic medical concerns.  These include hyperlipidemia, hypothyroidism, anxiety and more.  She reports staying active and trying to maintain a heart healthy diet.  She denies polyuria or polydipsia.  Does endorse some fatigue.  No recent hospitalization.  She is managing her activities of daily living well.  No acute concerns. Denies CP/palp/SOB/HA/congestion/fevers/GI or GU c/o. Taking meds as prescribed  Past Medical History:  Diagnosis Date  . ADD (attention deficit disorder with hyperactivity)   . Anxiety   . Asthma   . Cancer ([place])    melanoma- lip 2011  . Depression   . Diverticulosis   . Hyperlipidemia, mixed 03/11/2016  . Hypothyroidism   . Menopause   . Osteopenia   . Rosacea   . Sciatica of left side 02/24/2017  . Travel advice encounter 03/25/2016    Past Surgical History:  Procedure Laterality Date  . blephoroplasty    . BROW LIFT    . DILATION AND CURETTAGE OF UTERUS    . SKIN CANCER EXCISION     right lower lip  . TONSILLECTOMY AND ADENOIDECTOMY      Family History  Problem Relation Age of Onset  . Colon polyps Mother   . Parkinson's disease Mother   . Diabetes Father   . Emphysema Father   . Tuberculosis Father   . Colon cancer Maternal Grandfather   . Alcohol abuse Sister   . Cancer Sister        skin cancer, melanoma cell, basil cell  . ADD / ADHD Daughter   . Anxiety disorder Daughter   . Depression Daughter   . ADD / ADHD Son   . Anxiety disorder Son   . Depression Son   . Cancer Sister        melanoma  . Anxiety disorder Brother     Social History   Socioeconomic History  . Marital status: Single    Spouse name: Not on file  . Number of children: 2  . Years of education: Not on file  . Highest education  level: Not on file  Occupational History  . Occupation: Retired Ecologist  . Financial resource strain: Not on file  . Food insecurity:    Worry: Not on file    Inability: Not on file  . Transportation needs:    Medical: Not on file    Non-medical: Not on file  Tobacco Use  . Smoking status: Never Smoker  . Smokeless tobacco: Never Used  Substance and Sexual Activity  . Alcohol use: Yes    Alcohol/week: 3.0 - 4.0 standard drinks    Types: 1 - 2 Glasses of wine, 1 Cans of beer, 1 Shots of liquor per week    Comment: rare  . Drug use: No  . Sexual activity: Not on file  Lifestyle  . Physical activity:    Days per week: Not on file    Minutes per session: Not on file  . Stress: Not on file  Relationships  . Social connections:    Talks on phone: Not on file    Gets together: Not on file    Attends religious service: Not on file    Active member of club or organization:  Not on file    Attends meetings of clubs or organizations: Not on file    Relationship status: Not on file  . Intimate partner violence:    Fear of current or ex partner: Not on file    Emotionally abused: Not on file    Physically abused: Not on file    Forced sexual activity: Not on file  Other Topics Concern  . Not on file  Social History Narrative   Retired from [employer].   No major dietary restrictions except avoid gluten.        Outpatient Medications Prior to Visit  Medication Sig Dispense Refill  . albuterol (PROVENTIL HFA;VENTOLIN HFA) 108 (90 Base) MCG/ACT inhaler Inhale 2 puffs into the lungs every 6 (six) hours as needed for wheezing or shortness of breath. 1 Inhaler 0  . Cholecalciferol (VITAMIN D) 2000 UNITS CAPS Take by mouth 2 (two) times daily.     [name] escitalopram (LEXAPRO) 5 MG tablet Take 1 tablet (5 mg total) by mouth daily. 90 tablet 1  . fluticasone (FLONASE) 50 MCG/ACT nasal spray Place 2 sprays into both nostrils daily. 16 g 6  . GAMMA AMINOBUTYRIC ACID  PO Take 200 mg by mouth as needed. Up to bid    . hydrocortisone (PROCTOZONE-HC) 2.5 % rectal cream Place 1 application rectally 2 (two) times daily. 30 g 0  . Krill Oil 500 MG CAPS     . levothyroxine (SYNTHROID, LEVOTHROID) 50 MCG tablet Take 1 tablet (50 mcg total) by mouth daily. 90 tablet 1  . MAGNESIUM GLYCINATE PLUS PO Take 100 mg by mouth 2 (two) times daily.    [name] OVER THE COUNTER MEDICATION Influenzinum 30x   4 pellets 1 time a week.    . Probiotic Product (PROBIOTIC PO) Take 1 capsule by mouth daily.      [name] thyroid (ARMOUR THYROID) 15 MG tablet Take by mouth. 7.5 mg qd    . cefdinir (OMNICEF) 300 MG capsule Take 1 capsule (300 mg total) by mouth 2 (two) times daily. 20 capsule 0  . LORazepam (ATIVAN) 2 MG tablet Take 1 tablet (2 mg total) by mouth every 8 (eight) hours as needed for anxiety. 90 tablet 0  . amitriptyline (ELAVIL) 25 MG tablet Take 1 tablet (25 mg total) by mouth at bedtime. (Patient not taking: Reported on 05/28/2017) 30 tablet 1  . gabapentin (NEURONTIN) 300 MG capsule Take 1 capsule (300 mg total) by mouth 3 (three) times daily. 90 capsule 5   No facility-administered medications prior to visit.     Allergies  Allergen Reactions  . Hydrocodone-Acetaminophen Nausea And Vomiting  . Hydrocodone-Acetaminophen Nausea And Vomiting  . Gluten Meal Other (See Comments)    GI symptoms; has BM everytime she urinates GI symptoms; has BM everytime she urinates  . Oxycodone Other (See Comments)    Review of Systems  Constitutional: Positive for malaise/fatigue. Negative for chills and fever.  HENT: Negative for congestion and hearing loss.   Eyes: Negative for discharge.  Respiratory: Negative for cough, sputum production and shortness of breath.   Cardiovascular: Negative for chest pain, palpitations and leg swelling.  Gastrointestinal: Negative for abdominal pain, blood in stool, constipation, diarrhea, heartburn, nausea and vomiting.  Genitourinary: Negative for  dysuria, frequency, hematuria and urgency.  Musculoskeletal: Negative for back pain, falls and myalgias.  Skin: Negative for rash.  Neurological: Negative for dizziness, sensory change, loss of consciousness, weakness and headaches.  Endo/Heme/Allergies: Negative for environmental allergies. Does not bruise/bleed easily.  Psychiatric/Behavioral: Negative for depression and suicidal ideas. The patient is not nervous/anxious and does not have insomnia.        Objective:    Physical Exam  Constitutional: She is oriented to person, place, and time. No distress.  HENT:  Head: Normocephalic and atraumatic.  Right Ear: External ear normal.  Left Ear: External ear normal.  Nose: Nose normal.  Mouth/Throat: Oropharynx is clear and moist. No oropharyngeal exudate.  Eyes: Pupils are equal, round, and reactive to light. Conjunctivae are normal. Right eye exhibits no discharge. Left eye exhibits no discharge. No scleral icterus.  Neck: Normal range of motion. Neck supple. No thyromegaly present.  Cardiovascular: Normal rate, regular rhythm, normal heart sounds and intact distal pulses.  No murmur heard. Pulmonary/Chest: Effort normal and breath sounds normal. No respiratory distress. She has no wheezes. She has no rales.  Abdominal: Soft. Bowel sounds are normal. She exhibits no distension and no mass. There is no tenderness.  Musculoskeletal: Normal range of motion. She exhibits no edema or tenderness.  Lymphadenopathy:    She has no cervical adenopathy.  Neurological: She is alert and oriented to person, place, and time. She has normal reflexes. She displays normal reflexes. No cranial nerve deficit. Coordination normal.  Skin: Skin is warm and dry. No rash noted. She is not diaphoretic.    BP 106/64 (BP Location: Right Arm, Patient Position: Sitting, Cuff Size: Small)   Pulse (!) 58   Temp 97.9 F (36.6 C) (Oral)   Resp 16   Ht 5' 5.5" (1.664 m)   Wt 176 lb (79.8 kg)   LMP 03/29/2004    SpO2 98%   BMI 28.84 kg/m  Wt Readings from Last 3 Encounters:  03/02/18 176 lb (79.8 kg)  08/25/17 171 lb (77.6 kg)  07/15/17 172 lb (78 kg)     Lab Results  Component Value Date   WBC 4.1 03/02/2018   HGB 14.1 03/02/2018   HCT 41.3 03/02/2018   PLT 229.0 03/02/2018   GLUCOSE 88 03/02/2018   CHOL 188 03/02/2018   TRIG 79.0 03/02/2018   HDL 57.30 03/02/2018   LDLCALC 115 (H) 03/02/2018   ALT 15 03/02/2018   AST 22 03/02/2018   NA 140 03/02/2018   K 4.4 03/02/2018   CL 104 03/02/2018   CREATININE 0.87 03/02/2018   BUN 16 03/02/2018   CO2 29 03/02/2018   TSH 1.25 02/24/2017   HGBA1C 5.3 03/02/2018   MICROALBUR 0.2 02/28/2014    Lab Results  Component Value Date   TSH 1.25 02/24/2017   Lab Results  Component Value Date   WBC 4.1 03/02/2018   HGB 14.1 03/02/2018   HCT 41.3 03/02/2018   MCV 97.2 03/02/2018   PLT 229.0 03/02/2018   Lab Results  Component Value Date   NA 140 03/02/2018   K 4.4 03/02/2018   CO2 29 03/02/2018   GLUCOSE 88 03/02/2018   BUN 16 03/02/2018   CREATININE 0.87 03/02/2018   BILITOT 1.0 03/02/2018   ALKPHOS 61 03/02/2018   AST 22 03/02/2018   ALT 15 03/02/2018   PROT 6.6 03/02/2018   ALBUMIN 4.5 03/02/2018   CALCIUM 9.7 03/02/2018   GFR 68.93 03/02/2018   Lab Results  Component Value Date   CHOL 188 03/02/2018   Lab Results  Component Value Date   HDL 57.30 03/02/2018   Lab Results  Component Value Date   LDLCALC 115 (H) 03/02/2018   Lab Results  Component Value Date   TRIG 79.0  03/02/2018   Lab Results  Component Value Date   CHOLHDL 3 03/02/2018   Lab Results  Component Value Date   HGBA1C 5.3 03/02/2018       Assessment & Plan:   Problem List Items Addressed This Visit    Hypothyroidism    Follows with [place], [place] endocrinology will request labs and last note      Adjustment disorder with mixed anxiety and depressed mood    Is allowed a refill on her Lorazepam which she has not needed in several years.  UDS and contract are up to date       Vitamin D deficiency    Supplement and monitor      Relevant Orders   Comprehensive metabolic panel (Completed)   VITAMIN D 25 Hydroxy (Vit-D Deficiency, Fractures) (Completed)   BMI 29.0-29.9,adult    Encouraged DASH diet, decrease po intake and increase exercise as tolerated. Needs 7-8 hours of sleep nightly. Avoid trans fats, eat small, frequent meals every 4-5 hours with lean proteins, complex carbs and healthy fats. Minimize simple carbs      Hyperlipidemia, mixed    Encouraged heart healthy diet, increase exercise, avoid trans fats, consider a krill oil cap daily      Relevant Orders   Lipid panel (Completed)   Melanoma of skin ([place])    Is following with dematology      Relevant Medications   LORazepam (ATIVAN) 2 MG tablet   Preventative health care    Patient encouraged to maintain heart healthy diet, regular exercise, adequate sleep. Consider daily probiotics. Take medications as prescribed. Labs ordered and reviewed.       Hyperglycemia    hgba1c acceptable, minimize simple carbs. Increase exercise as tolerated. Continue current meds      Relevant Orders   Hemoglobin A1c (Completed)   Lead exposure    Uses Mineral clear supplement, does work with alternative practitioners.       Relevant Orders   CBC (Completed)   Heart murmur    New onset, was noted by her endocrinologist but is not audible today.       Other Visit Diagnoses    Gluten intolerance    -  Primary   Relevant Orders   Ambulatory referral to Genetics   Encounter for long-term (current) use of high-risk medication       Relevant Orders   Pain Mgmt, Profile 8 w/Conf, U   Need for vaccination against Streptococcus pneumoniae       Relevant Orders   Pneumococcal polysaccharide vaccine 23-valent greater than or equal to 2yo subcutaneous/IM (Completed)      I have discontinued Sevannah H. Bernath's amitriptyline, gabapentin, and cefdinir. I am also having her  maintain her Probiotic Product (PROBIOTIC PO), Vitamin D, GAMMA AMINOBUTYRIC ACID PO, thyroid, MAGNESIUM GLYCINATE PLUS PO, OVER THE COUNTER MEDICATION, hydrocortisone, Krill Oil, levothyroxine, fluticasone, albuterol, escitalopram, and LORazepam.  Meds ordered this encounter  Medications  . DISCONTD: LORazepam (ATIVAN) 2 MG tablet    Sig: Take 1 tablet (2 mg total) by mouth every 8 (eight) hours as needed for anxiety.    Dispense:  40 tablet    Refill:  0  . LORazepam (ATIVAN) 2 MG tablet    Sig: Take 1 tablet (2 mg total) by mouth every 8 (eight) hours as needed for anxiety.    Dispense:  40 tablet    Refill:  0     Penni Homans, MD

## 2018-03-02 NOTE — Assessment & Plan Note (Signed)
Supplement and monitor 

## 2018-03-02 NOTE — Assessment & Plan Note (Signed)
Encouraged heart healthy diet, increase exercise, avoid trans fats, consider a krill oil cap daily 

## 2018-03-02 NOTE — Assessment & Plan Note (Addendum)
New onset, was noted by her endocrinologist but is not audible today.

## 2018-03-02 NOTE — Assessment & Plan Note (Signed)
Is following with dematology

## 2018-03-02 NOTE — Patient Instructions (Addendum)
Shingrix is the new shingles, 2 shots over 2-6 months at pharmacy.   Preventive Care 67 Years and Older, Female Preventive care refers to lifestyle choices and visits with your health care provider that can promote health and wellness. What does preventive care include?  A yearly physical exam. This is also called an annual well check.  Dental exams once or twice a year.  Routine eye exams. Ask your health care provider how often you should have your eyes checked.  Personal lifestyle choices, including: ? Daily care of your teeth and gums. ? Regular physical activity. ? Eating a healthy diet. ? Avoiding tobacco and drug use. ? Limiting alcohol use. ? Practicing safe sex. ? Taking low-dose aspirin every day. ? Taking vitamin and mineral supplements as recommended by your health care provider. What happens during an annual well check? The services and screenings done by your health care provider during your annual well check will depend on your age, overall health, lifestyle risk factors, and family history of disease. Counseling Your health care provider Ainslie ask you questions about your:  Alcohol use.  Tobacco use.  Drug use.  Emotional well-being.  Home and relationship well-being.  Sexual activity.  Eating habits.  History of falls.  Memory and ability to understand (cognition).  Work and work Statistician.  Reproductive health.  Screening You Tarnesha have the following tests or measurements:  Height, weight, and BMI.  Blood pressure.  Lipid and cholesterol levels. These Teneka be checked every 5 years, or more frequently if you are over 67 years old.  Skin check.  Lung cancer screening. You Cashmere have this screening every year starting at age 46 if you have a 30-pack-year history of smoking and currently smoke or have quit within the past 15 years.  Fecal occult blood test (FOBT) of the stool. You Kynisha have this test every year starting at age 31.  Flexible  sigmoidoscopy or colonoscopy. You Tabria have a sigmoidoscopy every 5 years or a colonoscopy every 10 years starting at age 39.  Hepatitis C blood test.  Hepatitis B blood test.  Sexually transmitted disease (STD) testing.  Diabetes screening. This is done by checking your blood sugar (glucose) after you have not eaten for a while (fasting). You Mirai have this done every 1-3 years.  Bone density scan. This is done to screen for osteoporosis. You Yazmina have this done starting at age 2.  Mammogram. This Miski be done every 1-2 years. Talk to your health care provider about how often you should have regular mammograms.  Talk with your health care provider about your test results, treatment options, and if necessary, the need for more tests. Vaccines Your health care provider Karolina recommend certain vaccines, such as:  Influenza vaccine. This is recommended every year.  Tetanus, diphtheria, and acellular pertussis (Tdap, Td) vaccine. You Telesia need a Td booster every 10 years.  Varicella vaccine. You Leandrea need this if you have not been vaccinated.  Zoster vaccine. You Kaleigha need this after age 67.  Measles, mumps, and rubella (MMR) vaccine. You Ayanni need at least one dose of MMR if you were born in 1957 or later. You Chardonay also need a second dose.  Pneumococcal 13-valent conjugate (PCV13) vaccine. One dose is recommended after age 41.  Pneumococcal polysaccharide (PPSV23) vaccine. One dose is recommended after age 62.  Meningococcal vaccine. You Terianne need this if you have certain conditions.  Hepatitis A vaccine. You Waylynn need this if you have certain conditions or  if you travel or work in places where you Alix be exposed to hepatitis A.  Hepatitis B vaccine. You Anaiyah need this if you have certain conditions or if you travel or work in places where you Alba be exposed to hepatitis B.  Haemophilus influenzae type b (Hib) vaccine. You Terrah need this if you have certain conditions.  Talk to your health  care provider about which screenings and vaccines you need and how often you need them. This information is not intended to replace advice given to you by your health care provider. Make sure you discuss any questions you have with your health care provider. Document Released: 05/12/2015 Document Revised: 01/03/2016 Document Reviewed: 02/14/2015 Elsevier Interactive Patient Education  [employer].

## 2018-03-02 NOTE — Assessment & Plan Note (Addendum)
Uses Mineral clear supplement, does work with alternative practitioners.

## 2018-03-02 NOTE — Assessment & Plan Note (Signed)
Encouraged DASH diet, decrease po intake and increase exercise as tolerated. Needs 7-8 hours of sleep nightly. Avoid trans fats, eat small, frequent meals every 4-5 hours with lean proteins, complex carbs and healthy fats. Minimize simple carbs 

## 2018-03-02 NOTE — Assessment & Plan Note (Signed)
Follows with [place], [place] endocrinology will request labs and last note

## 2018-03-02 NOTE — Assessment & Plan Note (Signed)
Is allowed a refill on her Lorazepam which she has not needed in several years. UDS and contract are up to date

## 2018-03-02 NOTE — Assessment & Plan Note (Signed)
hgba1c acceptable, minimize simple carbs. Increase exercise as tolerated. Continue current meds 

## 2018-03-02 NOTE — Assessment & Plan Note (Signed)
Patient encouraged to maintain heart healthy diet, regular exercise, adequate sleep. Consider daily probiotics. Take medications as prescribed. Labs ordered and reviewed 

## 2018-03-03 LAB — PAIN MGMT, PROFILE 8 W/CONF, U
6 Acetylmorphine: NEGATIVE ng/mL (ref <10)
ALCOHOL METABOLITES: NEGATIVE ng/mL (ref <500)
Amphetamines: NEGATIVE ng/mL (ref <500)
BENZODIAZEPINES: NEGATIVE ng/mL (ref <100)
Buprenorphine, Urine: NEGATIVE ng/mL (ref <5)
COCAINE METABOLITE: NEGATIVE ng/mL (ref <150)
CREATININE: 41.7 mg/dL
MARIJUANA METABOLITE: NEGATIVE ng/mL (ref <20)
MDMA: NEGATIVE ng/mL (ref <500)
Opiates: NEGATIVE ng/mL (ref <100)
Oxidant: NEGATIVE ug/mL (ref <200)
Oxycodone: NEGATIVE ng/mL (ref <100)
PH: 6.74 (ref 4.5–9.0)

## 2018-03-05 ENCOUNTER — Ambulatory Visit: Payer: Medicare Other | Admitting: *Deleted

## 2018-03-09 ENCOUNTER — Ambulatory Visit: Payer: Medicare Other | Admitting: *Deleted

## 2018-03-10 DIAGNOSIS — Z23 Encounter for immunization: Secondary | ICD-10-CM | POA: Diagnosis not present

## 2018-03-10 DIAGNOSIS — D225 Melanocytic nevi of trunk: Secondary | ICD-10-CM | POA: Diagnosis not present

## 2018-03-10 DIAGNOSIS — Z86018 Personal history of other benign neoplasm: Secondary | ICD-10-CM | POA: Diagnosis not present

## 2018-03-10 DIAGNOSIS — D2271 Melanocytic nevi of right lower limb, including hip: Secondary | ICD-10-CM | POA: Diagnosis not present

## 2018-03-10 DIAGNOSIS — D2371 Other benign neoplasm of skin of right lower limb, including hip: Secondary | ICD-10-CM | POA: Diagnosis not present

## 2018-03-10 DIAGNOSIS — D2361 Other benign neoplasm of skin of right upper limb, including shoulder: Secondary | ICD-10-CM | POA: Diagnosis not present

## 2018-03-10 DIAGNOSIS — D2272 Melanocytic nevi of left lower limb, including hip: Secondary | ICD-10-CM | POA: Diagnosis not present

## 2018-03-10 DIAGNOSIS — Z86008 Personal history of in-situ neoplasm of other site: Secondary | ICD-10-CM | POA: Diagnosis not present

## 2018-03-10 DIAGNOSIS — D2261 Melanocytic nevi of right upper limb, including shoulder: Secondary | ICD-10-CM | POA: Diagnosis not present

## 2018-03-10 DIAGNOSIS — L570 Actinic keratosis: Secondary | ICD-10-CM | POA: Diagnosis not present

## 2018-03-11 NOTE — Progress Notes (Addendum)
Subjective:   Shannon [name] is a 67 y.o. female who presents for Medicare Annual (Subsequent) preventive examination.  Pt loves to Travel. Going to [place] in Feb. Pt lives in [place] during the summer.  Review of Systems: No ROS.  Medicare Wellness Visit. Additional risk factors are reflected in the social history. Cardiac Risk Factors include: advanced age (>32men, >1 women);dyslipidemia Sleep patterns: no issues.  Home Safety/Smoke Alarms: Feels safe in home. Smoke alarms in place. Lives alone in 2 story home.  Female:        Mammo- Pt reports last 05/2017.      Dexa scan-utd        CCS- 03/07/16-normal      Objective:     Vitals: BP 112/76 (BP Location: Left Arm, Patient Position: Sitting, Cuff Size: Normal)   Pulse 65   Ht 5\' 6"  (1.676 m)   Wt 177 lb 6.4 oz (80.5 kg)   LMP 03/29/2004   SpO2 95%   BMI 28.63 kg/m   Body mass index is 28.63 kg/m.  Advanced Directives 03/12/2018 02/24/2017 02/22/2016 02/22/2016 04/05/2015  Does Patient Have a Medical Advance Directive? Yes Yes Yes Yes No  Type of Paramedic of [place];Living will [place];Living will [place];Living will [place];Living will -  Does patient want to make changes to medical advance directive? - No - Patient declined - - -  Copy of [place] in Chart? Yes - validated most recent copy scanned in chart (See row information) No - copy requested No - copy requested - -  Would patient like information on creating a medical advance directive? - - - - No - patient declined information    Tobacco Social History   Tobacco Use  Smoking Status Never Smoker  Smokeless Tobacco Never Used     Counseling given: Not Answered   Clinical Intake: Pain : No/denies pain   Past Medical History:  Diagnosis Date  . ADD (attention deficit disorder with hyperactivity)   . Anxiety   . Asthma   . Cancer ([place])    melanoma- lip 2011  . Depression   . Diverticulosis   . Hyperlipidemia, mixed 03/11/2016  . Hypothyroidism   . Menopause   . Osteopenia   . Rosacea   . Sciatica of left side 02/24/2017  . Travel advice encounter 03/25/2016   Past Surgical History:  Procedure Laterality Date  . blephoroplasty    . BROW LIFT    . DILATION AND CURETTAGE OF UTERUS    . SKIN CANCER EXCISION     right lower lip  . TONSILLECTOMY AND ADENOIDECTOMY     Family History  Problem Relation Age of Onset  . Colon polyps Mother   . Parkinson's disease Mother   . Diabetes Father   . Emphysema Father   . Tuberculosis Father   . Colon cancer Maternal Grandfather   . Alcohol abuse Sister   . Cancer Sister        skin cancer, melanoma cell, basil cell  . ADD / ADHD Daughter   . Anxiety disorder Daughter   . Depression Daughter   . ADD / ADHD Son   . Anxiety disorder Son   . Depression Son   . Cancer Sister        melanoma  . Anxiety disorder Brother    Social History   Socioeconomic History  . Marital status: Single    Spouse name: Not on  file  . Number of children: 2  . Years of education: Not on file  . Highest education level: Not on file  Occupational History  . Occupation: Retired Ecologist  . Financial resource strain: Not on file  . Food insecurity:    Worry: Not on file    Inability: Not on file  . Transportation needs:    Medical: Not on file    Non-medical: Not on file  Tobacco Use  . Smoking status: Never Smoker  . Smokeless tobacco: Never Used  Substance and Sexual Activity  . Alcohol use: Yes    Alcohol/week: 3.0 - 4.0 standard drinks    Types: 1 - 2 Glasses of wine, 1 Cans of beer, 1 Shots of liquor per week    Comment: not every night.  . Drug use: No  . Sexual activity: Not on file  Lifestyle  . Physical activity:    Days per week: Not on file    Minutes per session: Not on file  . Stress: Not on file  Relationships  . Social connections:    Talks on  phone: Not on file    Gets together: Not on file    Attends religious service: Not on file    Active member of club or organization: Not on file    Attends meetings of clubs or organizations: Not on file    Relationship status: Not on file  Other Topics Concern  . Not on file  Social History Narrative   Retired from [employer].   No major dietary restrictions except avoid gluten.        Outpatient Encounter Medications as of 03/12/2018  Medication Sig  . Cholecalciferol (VITAMIN D) 2000 UNITS CAPS Take by mouth 2 (two) times daily.   [name] escitalopram (LEXAPRO) 5 MG tablet Take 1 tablet (5 mg total) by mouth daily.  [name] GAMMA AMINOBUTYRIC ACID PO Take 200 mg by mouth as needed. Up to bid  . hydrocortisone (PROCTOZONE-HC) 2.5 % rectal cream Place 1 application rectally 2 (two) times daily.  Javier Docker Oil 500 MG CAPS   . levothyroxine (SYNTHROID, LEVOTHROID) 50 MCG tablet Take 1 tablet (50 mcg total) by mouth daily.  [name] MAGNESIUM GLYCINATE PLUS PO Take 100 mg by mouth 2 (two) times daily.  [name] OVER THE COUNTER MEDICATION Influenzinum 30x   4 pellets 1 time a week.  . thyroid (ARMOUR THYROID) 15 MG tablet Take by mouth. 7.5 mg qd  . albuterol (PROVENTIL HFA;VENTOLIN HFA) 108 (90 Base) MCG/ACT inhaler Inhale 2 puffs into the lungs every 6 (six) hours as needed for wheezing or shortness of breath. (Patient not taking: Reported on 03/12/2018)  . fluticasone (FLONASE) 50 MCG/ACT nasal spray Place 2 sprays into both nostrils daily. (Patient not taking: Reported on 03/12/2018)  . LORazepam (ATIVAN) 2 MG tablet Take 1 tablet (2 mg total) by mouth every 8 (eight) hours as needed for anxiety. (Patient not taking: Reported on 03/12/2018)  . [DISCONTINUED] Probiotic Product (PROBIOTIC PO) Take 1 capsule by mouth daily.     No facility-administered encounter medications on file as of 03/12/2018.     Activities of Daily Living In your present state of health, do you have any difficulty performing  the following activities: 03/12/2018  Hearing? N  Vision? N  Difficulty concentrating or making decisions? N  Walking or climbing stairs? N  Dressing or bathing? N  Doing errands, shopping? N  Preparing Food and eating ? N  Using the Toilet?  N  In the past six months, have you accidently leaked urine? N  Do you have problems with loss of bowel control? N  Managing your Medications? N  Managing your Finances? N  Housekeeping or managing your Housekeeping? N  Some recent data might be hidden    Patient Care Team: Mosie Lukes, MD as PCP - General (Family Medicine)    Assessment:   This is a routine wellness examination for Joel. Physical assessment deferred to PCP.  Exercise Activities and Dietary recommendations Current Exercise Habits: Home exercise routine, Type of exercise: walking, Time (Minutes): 30, Frequency (Times/Week): 3, Weekly Exercise (Minutes/Week): 90, Intensity: Mild, Exercise limited by: None identified   Diet (meal preparation, eat out, water intake, caffeinated beverages, dairy products, fruits and vegetables): in general, a "healthy" diet  , well balanced     Goals    . Patient Stated     Control sugar intake        Fall Risk Fall Risk  03/12/2018 02/24/2017 02/22/2016 04/05/2015 03/13/2015  Falls in the past year? 0 No No No No  Risk for fall due to : - - - Other (Comment) -    Depression Screen PHQ 2/9 Scores 03/12/2018 02/24/2017 02/22/2016 04/05/2015  PHQ - 2 Score 0 0 0 0  Exception Documentation - - - Other- indicate reason in comment box     Cognitive Function Ad8 score reviewed for issues:  Issues making decisions:no  Less interest in hobbies / activities:no  Repeats questions, stories (family complaining):no  Trouble using ordinary gadgets (microwave, computer, phone):no  Forgets the month or year: no  Mismanaging finances: no  Remembering appts:no  Daily problems with thinking and/or memory:no Ad8 score is=0         Immunization History  Administered Date(s) Administered  . Influenza Split 03/08/2013  . Influenza-Unspecified 03/07/2014  . PPD Test 03/08/2013, 02/28/2014, 03/13/2015  . Pneumococcal Conjugate-13 03/11/2016  . Pneumococcal Polysaccharide-23 03/02/2018  . Pneumococcal-Unspecified 04/08/2011  . Tdap 09/05/2009  . Zoster 02/28/2014   Screening Tests Health Maintenance  Topic Date Due  . INFLUENZA VACCINE  03/01/2019 (Originally 11/27/2017)  . MAMMOGRAM  05/31/2018  . TETANUS/TDAP  09/06/2019  . COLONOSCOPY  03/07/2026  . DEXA SCAN  Completed  . Hepatitis C Screening  Completed  . PNA vac Low Risk Adult  Completed      Plan:    Please schedule your next medicare wellness visit with me in 1 yr.  Continue to eat heart healthy diet (full of fruits, vegetables, whole grains, lean protein, water--limit salt, fat, and sugar intake) and increase physical activity as tolerated.  Please have bone density scan added on to your mammogram.  I will ask Dr.Blyth to contact you about your genetic testing question  I have personally reviewed and noted the following in the patient's chart:   . Medical and social history . Use of alcohol, tobacco or illicit drugs  . Current medications and supplements . Functional ability and status . Nutritional status . Physical activity . Advanced directives . List of other physicians . Hospitalizations, surgeries, and ER visits in previous 12 months . Vitals . Screenings to include cognitive, depression, and falls . Referrals and appointments  In addition, I have reviewed and discussed with patient certain preventive protocols, quality metrics, and best practice recommendations. A written personalized care plan for preventive services as well as general preventive health recommendations were provided to patient.     Shela Nevin, [place]  03/12/2018   Medical  screening examination/treatment was performed by qualified clinical staff member  and as supervising physician I was immediately available for consultation/collaboration. I have reviewed documentation and agree with assessment and plan.  Penni Homans, MD

## 2018-03-12 ENCOUNTER — Ambulatory Visit (INDEPENDENT_AMBULATORY_CARE_PROVIDER_SITE_OTHER): Payer: Medicare Other | Admitting: *Deleted

## 2018-03-12 ENCOUNTER — Encounter: Payer: Self-pay | Admitting: *Deleted

## 2018-03-12 VITALS — BP 112/76 | HR 65 | Ht 66.0 in | Wt 177.4 lb

## 2018-03-12 DIAGNOSIS — Z0000 Encounter for general adult medical examination without abnormal findings: Secondary | ICD-10-CM

## 2018-03-12 NOTE — Patient Instructions (Signed)
Please schedule your next medicare wellness visit with me in 1 yr.  Continue to eat heart healthy diet (full of fruits, vegetables, whole grains, lean protein, water--limit salt, fat, and sugar intake) and increase physical activity as tolerated.  Please have bone density scan added on to your mammogram.  I will ask Dr.Blyth to contact you about your genetic testing question   [name] , Thank you for taking time to come for your Medicare Wellness Visit. I appreciate your ongoing commitment to your health goals. Please review the following plan we discussed and let me know if I can assist you in the future.   These are the goals we discussed: Goals    . Patient Stated     Control sugar intake        This is a list of the screening recommended for you and due dates:  Health Maintenance  Topic Date Due  . Flu Shot  03/01/2019*  . Mammogram  05/31/2018  . Tetanus Vaccine  09/06/2019  . Colon Cancer Screening  03/07/2026  . DEXA scan (bone density measurement)  Completed  .  Hepatitis C: One time screening is recommended by Center for Disease Control  (CDC) for  adults born from [birth year] through 1965.   Completed  . Pneumonia vaccines  Completed  *Topic was postponed. The date shown is not the original due date.    Health Maintenance for Postmenopausal Women Menopause is a normal process in which your reproductive ability comes to an end. This process happens gradually over a span of months to years, usually between the ages of 73 and 27. Menopause is complete when you have missed 12 consecutive menstrual periods. It is important to talk with your health care provider about some of the most common conditions that affect postmenopausal women, such as heart disease, cancer, and bone loss (osteoporosis). Adopting a healthy lifestyle and getting preventive care can help to promote your health and wellness. Those actions can also lower your chances of developing some of these common  conditions. What should I know about menopause? During menopause, you Shannon [name] experience a number of symptoms, such as:  Moderate-to-severe hot flashes.  Night sweats.  Decrease in sex drive.  Mood swings.  Headaches.  Tiredness.  Irritability.  Memory problems.  Insomnia.  Choosing to treat or not to treat menopausal changes is an individual decision that you make with your health care provider. What should I know about hormone replacement therapy and supplements? Hormone therapy products are effective for treating symptoms that are associated with menopause, such as hot flashes and night sweats. Hormone replacement carries certain risks, especially as you become older. If you are thinking about using estrogen or estrogen with progestin treatments, discuss the benefits and risks with your health care provider. What should I know about heart disease and stroke? Heart disease, heart attack, and stroke become more likely as you age. This Aracelly be due, in part, to the hormonal changes that your body experiences during menopause. These can affect how your body processes dietary fats, triglycerides, and cholesterol. Heart attack and stroke are both medical emergencies. There are many things that you can do to help prevent heart disease and stroke:  Have your blood pressure checked at least every 1-2 years. High blood pressure causes heart disease and increases the risk of stroke.  If you are 40-17 years old, ask your health care provider if you should take aspirin to prevent a heart attack or a stroke.  Do not  use any tobacco products, including cigarettes, chewing tobacco, or electronic cigarettes. If you need help quitting, ask your health care provider.  It is important to eat a healthy diet and maintain a healthy weight. ? Be sure to include plenty of vegetables, fruits, low-fat dairy products, and lean protein. ? Avoid eating foods that are high in solid fats, added sugars, or salt  (sodium).  Get regular exercise. This is one of the most important things that you can do for your health. ? Try to exercise for at least 150 minutes each week. The type of exercise that you do should increase your heart rate and make you sweat. This is known as moderate-intensity exercise. ? Try to do strengthening exercises at least twice each week. Do these in addition to the moderate-intensity exercise.  Know your numbers.Ask your health care provider to check your cholesterol and your blood glucose. Continue to have your blood tested as directed by your health care provider.  What should I know about cancer screening? There are several types of cancer. Take the following steps to reduce your risk and to catch any cancer development as early as possible. Breast Cancer  Practice breast self-awareness. ? This means understanding how your breasts normally appear and feel. ? It also means doing regular breast self-exams. Let your health care provider know about any changes, no matter how small.  If you are 19 or older, have a clinician do a breast exam (clinical breast exam or CBE) every year. Depending on your age, family history, and medical history, it [name] be recommended that you also have a yearly breast X-ray (mammogram).  If you have a family history of breast cancer, talk with your health care provider about genetic screening.  If you are at high risk for breast cancer, talk with your health care provider about having an MRI and a mammogram every year.  Breast cancer (BRCA) gene test is recommended for women who have family members with BRCA-related cancers. Results of the assessment will determine the need for genetic counseling and BRCA1 and for BRCA2 testing. BRCA-related cancers include these types: ? Breast. This occurs in males or females. ? Ovarian. ? Tubal. This Alveria also be called fallopian tube cancer. ? Cancer of the abdominal or pelvic lining (peritoneal  cancer). ? Prostate. ? Pancreatic.  Cervical, Uterine, and Ovarian Cancer Your health care provider [name] recommend that you be screened regularly for cancer of the pelvic organs. These include your ovaries, uterus, and vagina. This screening involves a pelvic exam, which includes checking for microscopic changes to the surface of your cervix (Pap test).  For women ages 21-65, health care providers Inocencia recommend a pelvic exam and a Pap test every three years. For women ages 5-65, they Halei recommend the Pap test and pelvic exam, combined with testing for human papilloma virus (HPV), every five years. Some types of HPV increase your risk of cervical cancer. Testing for HPV Jakala also be done on women of any age who have unclear Pap test results.  Other health care providers Lonita not recommend any screening for nonpregnant women who are considered low risk for pelvic cancer and have no symptoms. Ask your health care provider if a screening pelvic exam is right for you.  If you have had past treatment for cervical cancer or a condition that could lead to cancer, you need Pap tests and screening for cancer for at least 20 years after your treatment. If Pap tests have been discontinued for you,  your risk factors (such as having a new sexual partner) need to be reassessed to determine if you should start having screenings again. Some women have medical problems that increase the chance of getting cervical cancer. In these cases, your health care provider Zarria recommend that you have screening and Pap tests more often.  If you have a family history of uterine cancer or ovarian cancer, talk with your health care provider about genetic screening.  If you have vaginal bleeding after reaching menopause, tell your health care provider.  There are currently no reliable tests available to screen for ovarian cancer.  Lung Cancer Lung cancer screening is recommended for adults 82-47 years old who are at high risk for  lung cancer because of a history of smoking. A yearly low-dose CT scan of the lungs is recommended if you:  Currently smoke.  Have a history of at least 30 pack-years of smoking and you currently smoke or have quit within the past 15 years. A pack-year is smoking an average of one pack of cigarettes per day for one year.  Yearly screening should:  Continue until it has been 15 years since you quit.  Stop if you develop a health problem that would prevent you from having lung cancer treatment.  Colorectal Cancer  This type of cancer can be detected and can often be prevented.  Routine colorectal cancer screening usually begins at age 22 and continues through age 24.  If you have risk factors for colon cancer, your health care provider Latana recommend that you be screened at an earlier age.  If you have a family history of colorectal cancer, talk with your health care provider about genetic screening.  Your health care provider Deiondra also recommend using home test kits to check for hidden blood in your stool.  A small camera at the end of a tube can be used to examine your colon directly (sigmoidoscopy or colonoscopy). This is done to check for the earliest forms of colorectal cancer.  Direct examination of the colon should be repeated every 5-10 years until age 75. However, if early forms of precancerous polyps or small growths are found or if you have a family history or genetic risk for colorectal cancer, you Misbah need to be screened more often.  Skin Cancer  Check your skin from head to toe regularly.  Monitor any moles. Be sure to tell your health care provider: ? About any new moles or changes in moles, especially if there is a change in a mole's shape or color. ? If you have a mole that is larger than the size of a pencil eraser.  If any of your family members has a history of skin cancer, especially at a young age, talk with your health care provider about genetic  screening.  Always use sunscreen. Apply sunscreen liberally and repeatedly throughout the day.  Whenever you are outside, protect yourself by wearing long sleeves, pants, a wide-brimmed hat, and sunglasses.  What should I know about osteoporosis? Osteoporosis is a condition in which bone destruction happens more quickly than new bone creation. After menopause, you Bergen be at an increased risk for osteoporosis. To help prevent osteoporosis or the bone fractures that can happen because of osteoporosis, the following is recommended:  If you are 49-9 years old, get at least 1,000 mg of calcium and at least 600 mg of vitamin D per day.  If you are older than age 66 but younger than age 83, get at least  1,200 mg of calcium and at least 600 mg of vitamin D per day.  If you are older than age 70, get at least 1,200 mg of calcium and at least 800 mg of vitamin D per day.  Smoking and excessive alcohol intake increase the risk of osteoporosis. Eat foods that are rich in calcium and vitamin D, and do weight-bearing exercises several times each week as directed by your health care provider. What should I know about how menopause affects my mental health? Depression Malli occur at any age, but it is more common as you become older. Common symptoms of depression include:  Low or sad mood.  Changes in sleep patterns.  Changes in appetite or eating patterns.  Feeling an overall lack of motivation or enjoyment of activities that you previously enjoyed.  Frequent crying spells.  Talk with your health care provider if you think that you are experiencing depression. What should I know about immunizations? It is important that you get and maintain your immunizations. These include:  Tetanus, diphtheria, and pertussis (Tdap) booster vaccine.  Influenza every year before the flu season begins.  Pneumonia vaccine.  Shingles vaccine.  Your health care provider Bellatrix also recommend other  immunizations. This information is not intended to replace advice given to you by your health care provider. Make sure you discuss any questions you have with your health care provider. Document Released: 06/07/2005 Document Revised: 11/03/2015 Document Reviewed: 01/17/2015 Elsevier Interactive Patient Education  2018 Elsevier Inc.  

## 2018-03-18 ENCOUNTER — Telehealth: Payer: Self-pay | Admitting: Family Medicine

## 2018-03-18 NOTE — Telephone Encounter (Signed)
Copied from [place] [phone]. Topic: Quick Communication - See Telephone Encounter >> Mar 18, 2018  1:13 PM Antonieta Iba C wrote: CRM for notification. See Telephone encounter for: 03/18/18.  Pt called in to be advised. Pt says that she had her flu shot in the office and would like to know how long do she have to wait to have her shingles vac?   ALSO, pt says in her ov she and PCP discuss having genetic testing completed, pt says that she still have not heard anything further about it.   Please advise.

## 2018-03-19 NOTE — Telephone Encounter (Signed)
Spoke with about testing let her know Dr Charlett Blake did place the orders to the Cancer center and they do not do genetic testing on patients

## 2018-03-30 DIAGNOSIS — E559 Vitamin D deficiency, unspecified: Secondary | ICD-10-CM | POA: Diagnosis not present

## 2018-03-30 DIAGNOSIS — E349 Endocrine disorder, unspecified: Secondary | ICD-10-CM | POA: Diagnosis not present

## 2018-03-30 DIAGNOSIS — E039 Hypothyroidism, unspecified: Secondary | ICD-10-CM | POA: Diagnosis not present

## 2018-03-30 LAB — VITAMIN D 25 HYDROXY (VIT D DEFICIENCY, FRACTURES): Vit D, 25-Hydroxy: 57

## 2018-03-30 LAB — TSH: TSH: 3.16 (ref 0.41–5.90)

## 2018-04-09 ENCOUNTER — Encounter: Payer: Self-pay | Admitting: Family Medicine

## 2018-04-16 DIAGNOSIS — M5416 Radiculopathy, lumbar region: Secondary | ICD-10-CM | POA: Diagnosis not present

## 2018-04-21 DIAGNOSIS — J00 Acute nasopharyngitis [common cold]: Secondary | ICD-10-CM | POA: Diagnosis not present

## 2018-04-21 DIAGNOSIS — J309 Allergic rhinitis, unspecified: Secondary | ICD-10-CM | POA: Diagnosis not present

## 2018-04-21 DIAGNOSIS — J069 Acute upper respiratory infection, unspecified: Secondary | ICD-10-CM | POA: Diagnosis not present

## 2018-04-30 DIAGNOSIS — M5416 Radiculopathy, lumbar region: Secondary | ICD-10-CM | POA: Diagnosis not present

## 2018-05-01 DIAGNOSIS — R5383 Other fatigue: Secondary | ICD-10-CM | POA: Diagnosis not present

## 2018-05-04 DIAGNOSIS — M5416 Radiculopathy, lumbar region: Secondary | ICD-10-CM | POA: Diagnosis not present

## 2018-05-07 ENCOUNTER — Telehealth: Payer: Self-pay | Admitting: *Deleted

## 2018-05-07 NOTE — Telephone Encounter (Signed)
Received Physician Orders from [facility]; forwarded to provider/SLS 01/09

## 2018-05-14 DIAGNOSIS — M5416 Radiculopathy, lumbar region: Secondary | ICD-10-CM | POA: Diagnosis not present

## 2018-05-20 DIAGNOSIS — H524 Presbyopia: Secondary | ICD-10-CM | POA: Diagnosis not present

## 2018-05-20 DIAGNOSIS — H25813 Combined forms of age-related cataract, bilateral: Secondary | ICD-10-CM | POA: Diagnosis not present

## 2018-05-20 DIAGNOSIS — H02834 Dermatochalasis of left upper eyelid: Secondary | ICD-10-CM | POA: Diagnosis not present

## 2018-05-20 DIAGNOSIS — H43393 Other vitreous opacities, bilateral: Secondary | ICD-10-CM | POA: Diagnosis not present

## 2018-05-20 DIAGNOSIS — H5213 Myopia, bilateral: Secondary | ICD-10-CM | POA: Diagnosis not present

## 2018-05-20 DIAGNOSIS — H52203 Unspecified astigmatism, bilateral: Secondary | ICD-10-CM | POA: Diagnosis not present

## 2018-05-20 DIAGNOSIS — H18211 Corneal edema secondary to contact lens, right eye: Secondary | ICD-10-CM | POA: Diagnosis not present

## 2018-05-20 DIAGNOSIS — H02831 Dermatochalasis of right upper eyelid: Secondary | ICD-10-CM | POA: Diagnosis not present

## 2018-05-27 DIAGNOSIS — M5416 Radiculopathy, lumbar region: Secondary | ICD-10-CM | POA: Diagnosis not present

## 2018-06-03 DIAGNOSIS — Z1231 Encounter for screening mammogram for malignant neoplasm of breast: Secondary | ICD-10-CM | POA: Diagnosis not present

## 2018-06-03 DIAGNOSIS — M8589 Other specified disorders of bone density and structure, multiple sites: Secondary | ICD-10-CM | POA: Diagnosis not present

## 2018-06-03 DIAGNOSIS — Z803 Family history of malignant neoplasm of breast: Secondary | ICD-10-CM | POA: Diagnosis not present

## 2018-06-03 DIAGNOSIS — Z8262 Family history of osteoporosis: Secondary | ICD-10-CM | POA: Diagnosis not present

## 2018-06-03 LAB — HM MAMMOGRAPHY

## 2018-06-03 LAB — HM DEXA SCAN

## 2018-06-04 ENCOUNTER — Other Ambulatory Visit: Payer: Self-pay | Admitting: Family Medicine

## 2018-06-04 DIAGNOSIS — M5416 Radiculopathy, lumbar region: Secondary | ICD-10-CM | POA: Diagnosis not present

## 2018-06-08 ENCOUNTER — Encounter: Payer: Self-pay | Admitting: Family

## 2018-06-08 ENCOUNTER — Telehealth: Payer: Self-pay

## 2018-06-08 ENCOUNTER — Telehealth: Payer: Self-pay | Admitting: *Deleted

## 2018-06-08 ENCOUNTER — Ambulatory Visit (INDEPENDENT_AMBULATORY_CARE_PROVIDER_SITE_OTHER): Payer: Medicare Other | Admitting: Family

## 2018-06-08 VITALS — BP 116/70 | HR 63 | Temp 98.1°F | Ht 66.0 in | Wt 177.0 lb

## 2018-06-08 DIAGNOSIS — M5134 Other intervertebral disc degeneration, thoracic region: Secondary | ICD-10-CM | POA: Diagnosis not present

## 2018-06-08 DIAGNOSIS — R42 Dizziness and giddiness: Secondary | ICD-10-CM | POA: Diagnosis not present

## 2018-06-08 DIAGNOSIS — Q761 Klippel-Feil syndrome: Secondary | ICD-10-CM | POA: Diagnosis not present

## 2018-06-08 DIAGNOSIS — M47816 Spondylosis without myelopathy or radiculopathy, lumbar region: Secondary | ICD-10-CM | POA: Diagnosis not present

## 2018-06-08 DIAGNOSIS — M5416 Radiculopathy, lumbar region: Secondary | ICD-10-CM | POA: Diagnosis not present

## 2018-06-08 DIAGNOSIS — M47814 Spondylosis without myelopathy or radiculopathy, thoracic region: Secondary | ICD-10-CM | POA: Diagnosis not present

## 2018-06-08 DIAGNOSIS — M5136 Other intervertebral disc degeneration, lumbar region: Secondary | ICD-10-CM | POA: Diagnosis not present

## 2018-06-08 DIAGNOSIS — M4155 Other secondary scoliosis, thoracolumbar region: Secondary | ICD-10-CM | POA: Diagnosis not present

## 2018-06-08 NOTE — Telephone Encounter (Signed)
Received Bone Density results from [facility]; forwarded to provider/SLS 02/10

## 2018-06-08 NOTE — Telephone Encounter (Signed)
Copied from [place] [phone]. Topic: Appointment Scheduling - Scheduling Inquiry for Clinic >> Jun 03, 2018 11:05 AM Margot Ables wrote: Reason for CRM: pt called stating mild dizziness since before Christmas (mostly getting up from laying flat or rolling to left side). She is fine up and moving, no dizziness on driving, riding bike, daily activities. It started around the same time she had an URI and shoulder pain (was treated). She is hoping to be worked in with Dr. Charlett Blake before traveling to [place] and [place]. She is leaving town 2/18. Please advise if Dr. Charlett Blake can see her (cannot come 06/04/2018). Pt states that if Dr. Charlett Blake cannot see her it would be convenient to go to [facility] office if that's ok with Dr. Charlett Blake. Or a recommendation from her of who to see. Please advise.      Anderson Malta can you call patient and have her see an [place]

## 2018-06-08 NOTE — Progress Notes (Signed)
Shannon [name] is a 68 y.o. female with the following history as recorded in EpicCare:  Patient Active Problem List   Diagnosis Date Noted  . Lead exposure 03/02/2018  . Heart murmur 03/02/2018  . 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  . Hyperglycemia 02/24/2017  . Lumbar degenerative disc disease 02/24/2017  . Urinary frequency 08/05/2016  . Preventative health care 03/25/2016  . Hyperlipidemia, mixed 03/11/2016  . Melanoma of skin ([place]) 03/11/2016  . BMI 29.0-29.9,adult 03/13/2015  . Vitamin D deficiency 02/28/2014  . Hypothyroidism 03/08/2013  . Adjustment disorder with mixed anxiety and depressed mood 03/08/2013  . Nuclear sclerotic cataract of both eyes 07/23/2012  . Knee pain 09/26/2010  . HAMMER TOE, ACQUIRED 03/15/2009    Current Outpatient Medications  Medication Sig Dispense Refill  . Cholecalciferol (VITAMIN D) 2000 UNITS CAPS Take by mouth 2 (two) times daily.     [name] escitalopram (LEXAPRO) 5 MG tablet TAKE 1 TABLET ONCE DAILY. 90 tablet 0  . fluticasone (FLONASE) 50 MCG/ACT nasal spray Place 2 sprays into both nostrils daily. 16 g 6  . GAMMA AMINOBUTYRIC ACID PO Take 200 mg by mouth as needed. Up to bid    . hydrocortisone (PROCTOZONE-HC) 2.5 % rectal cream Place 1 application rectally 2 (two) times daily. 30 g 0  . Krill Oil 500 MG CAPS     . levothyroxine (SYNTHROID, LEVOTHROID) 50 MCG tablet Take 1 tablet (50 mcg total) by mouth daily. 90 tablet 1  . MAGNESIUM GLYCINATE PLUS PO Take 100 mg by mouth 2 (two) times daily.    [name] OVER THE COUNTER MEDICATION Influenzinum 30x   4 pellets 1 time a week.    . thyroid (ARMOUR THYROID) 15 MG tablet Take by mouth. 7.5 mg qd    . gabapentin (NEURONTIN) 300 MG capsule     . LORazepam (ATIVAN) 2 MG tablet Take 1 tablet (2 mg total) by mouth every 8 (eight) hours as needed for anxiety. (Patient not taking: Reported on 03/12/2018) 40 tablet 0   No current facility-administered medications for this visit.     Allergies:  Hydrocodone-acetaminophen; Hydrocodone-acetaminophen; Gluten meal; and Oxycodone  Past Medical History:  Diagnosis Date  . ADD (attention deficit disorder with hyperactivity)   . Anxiety   . Asthma   . Cancer ([place])    melanoma- lip 2011  . Depression   . Diverticulosis   . Hyperlipidemia, mixed 03/11/2016  . Hypothyroidism   . Menopause   . Osteopenia   . Rosacea   . Sciatica of left side 02/24/2017  . Travel advice encounter 03/25/2016    Past Surgical History:  Procedure Laterality Date  . blephoroplasty    . BROW LIFT    . DILATION AND CURETTAGE OF UTERUS    . SKIN CANCER EXCISION     right lower lip  . TONSILLECTOMY AND ADENOIDECTOMY      Family History  Problem Relation Age of Onset  . Colon polyps Mother   . Parkinson's disease Mother   . Diabetes Father   . Emphysema Father   . Tuberculosis Father   . Colon cancer Maternal Grandfather   . Alcohol abuse Sister   . Cancer Sister        skin cancer, melanoma cell, basil cell  . ADD / ADHD Daughter   . Anxiety disorder Daughter   . Depression Daughter   . ADD / ADHD Son   . Anxiety disorder Son   . Depression Son   . Cancer Sister  melanoma  . Anxiety disorder Brother     Social History   Tobacco Use  . Smoking status: Never Smoker  . Smokeless tobacco: Never Used  Substance Use Topics  . Alcohol use: Yes    Alcohol/week: 3.0 - 4.0 standard drinks    Types: 1 - 2 Glasses of wine, 1 Cans of beer, 1 Shots of liquor per week    Comment: not every night.    Subjective:  Patient presents with concerns for dizziness on and off x 6 weeks; is concerned because she is leaving for [place]/ [place] next week; notes in the beginning, felt like the room was spinning around her- now feels that symptoms are improved- just feel off balance; most noticeable with getting up/ changing positions; no history of hypertension; does feel like vision Shannon [name] not be as good in the past month; no chest pain, no shortness  of breath; does not feel that her heart is beating irregularly; In reviewing notes from the past year, patient mentioned that she had 6 week history of feeling dizzy at [place] in April 2019- she was leaving for [place] the next day and was encouraged to follow-up there; notes symptoms resolved and did not need to see her provider there.    Objective:  Vitals:   06/08/18 1135  BP: 116/70  Pulse: 63  Temp: 98.1 F (36.7 C)  TempSrc: Oral  SpO2: 96%  Weight: 177 lb (80.3 kg)  Height: 5\' 6"  (1.676 m)    General: Well developed, well nourished, in no acute distress  Skin : Warm and dry.  Head: Normocephalic and atraumatic  Eyes: Sclera and conjunctiva clear; pupils round and reactive to light; extraocular movements intact  Ears: External normal; canals clear; tympanic membranes normal  Oropharynx: Pink, supple. No suspicious lesions  Neck: Supple without thyromegaly, adenopathy  Lungs: Respirations unlabored; clear to auscultation bilaterally without wheeze, rales, rhonchi  CVS exam: normal rate and regular rhythm.  Neurologic: Alert and oriented; speech intact; face symmetrical; moves all extremities well; CNII-XII intact without focal deficit   Assessment:  1. Dizziness     Plan:  Do feel that patient can travel next week- will not have time to get testing done before she leaves; suspect symptoms are related to her inner ear- Shannon [name] benefit from vestibular therapy/ she plans to see her chiropractor this week for possible manipulation that has been beneficial in the past; Am concerned about the recurrent episodes of dizziness she has mentioned to me/ she notes she has not had to see her PCP about them- symptoms usually resolve; discussed brain imaging, carotid dopplers, vestibular therapy- she will plan to see her PCP when she gets back from trip to [place]/ [place] to discuss; she notes she would prefer her PCP to manage testing/ follow-up with I agree is appropriate.     Spent 30+  minutes with patient reviewing symptoms/ treatment plans; No follow-ups on file.  No orders of the defined types were placed in this encounter.   Requested Prescriptions    No prescriptions requested or ordered in this encounter

## 2018-06-09 ENCOUNTER — Telehealth: Payer: Self-pay | Admitting: *Deleted

## 2018-06-09 NOTE — Telephone Encounter (Signed)
Received Bone Density results from [facility]; forwarded to provider/SLS 02/11  

## 2018-06-11 ENCOUNTER — Telehealth: Payer: Self-pay | Admitting: *Deleted

## 2018-06-11 DIAGNOSIS — M5416 Radiculopathy, lumbar region: Secondary | ICD-10-CM | POA: Diagnosis not present

## 2018-06-11 NOTE — Telephone Encounter (Signed)
Received Mammogram results from [facility]; forwarded to provider/SLS 02/13  

## 2018-06-19 ENCOUNTER — Encounter: Payer: Self-pay | Admitting: Family Medicine

## 2018-07-23 ENCOUNTER — Ambulatory Visit: Payer: Medicare Other | Admitting: Family Medicine

## 2018-08-24 ENCOUNTER — Other Ambulatory Visit: Payer: Self-pay | Admitting: Family Medicine

## 2018-09-01 DIAGNOSIS — M5416 Radiculopathy, lumbar region: Secondary | ICD-10-CM | POA: Diagnosis not present

## 2018-09-09 DIAGNOSIS — Z03818 Encounter for observation for suspected exposure to other biological agents ruled out: Secondary | ICD-10-CM | POA: Diagnosis not present

## 2018-09-09 DIAGNOSIS — M5416 Radiculopathy, lumbar region: Secondary | ICD-10-CM | POA: Diagnosis not present

## 2018-09-10 ENCOUNTER — Other Ambulatory Visit: Payer: Self-pay

## 2018-09-10 ENCOUNTER — Other Ambulatory Visit: Payer: Self-pay | Admitting: Sports Medicine

## 2018-09-10 ENCOUNTER — Encounter: Payer: Self-pay | Admitting: Sports Medicine

## 2018-09-10 ENCOUNTER — Ambulatory Visit
Admission: RE | Admit: 2018-09-10 | Discharge: 2018-09-10 | Disposition: A | Discharge status: Home / Self Care | Payer: Medicare Other | Source: Ambulatory Visit | Attending: Sports Medicine | Admitting: Sports Medicine

## 2018-09-10 ENCOUNTER — Ambulatory Visit (INDEPENDENT_AMBULATORY_CARE_PROVIDER_SITE_OTHER): Payer: Medicare Other | Admitting: Sports Medicine

## 2018-09-10 ENCOUNTER — Ambulatory Visit (HOSPITAL_COMMUNITY): Payer: Medicare Other

## 2018-09-10 VITALS — BP 104/70 | Ht 65.0 in | Wt 170.0 lb

## 2018-09-10 DIAGNOSIS — M542 Cervicalgia: Secondary | ICD-10-CM

## 2018-09-10 DIAGNOSIS — R52 Pain, unspecified: Secondary | ICD-10-CM

## 2018-09-10 HISTORY — DX: Cervicalgia: M54.2

## 2018-09-10 NOTE — Progress Notes (Signed)
CC: neck pain  Patient has progressively noted more difficulty looking over left shoulder Neck pain with normal activities Neck pain at HS Sometimes pain with biking if she has to look over shoulder Feels tight in upper neck Has tried dry needling x 4 on trapezius with no relief Tried some PT and muscle release - temporarily helps but back by next day (Sees Katrine Coho who really helps back and jeremy phiilips - both have helped lots of issues but not getting relief with the neck problems)  Past Hx LDDD Other chronic illnesses reviewed and stable  ROS Denies weakness in hand or arm No numbness or tingling in arm or hand  PE Pleasant F in NAD BP 104/70   Ht 5\' 5"  (1.651 m)   Wt 170 lb (77.1 kg)   LMP 03/29/2004   BMI 28.29 kg/m   Neck Limited extension Full flexion Rotation to left is < 50% and about 60% norm to RT Lateral bend only 10 deg left and 20 RT  Neuro testing C5 to T1 normal Reflexes normal  Significant trapezius spasm on left

## 2018-09-10 NOTE — Assessment & Plan Note (Signed)
XR HEP Ibuprofen Still uses gabapentin for Lumbar DDD when needed  Can try for cervical pain  Posture empahsized as she does tend toward head forward position  Reck 6 weeks

## 2018-09-10 NOTE — Patient Instructions (Signed)
Neck exercises Isometric resistance in normal posture forward, back and side to side 5 to 10 repeats Each for a count of 5 to 10  Shake outs - 30 sec. X 3  Stretches - I and T for 5 deep breaths and can repeat  Posture cues are the following: Cheek bone over breast bone Shoulders back with ears over midline

## 2018-09-24 DIAGNOSIS — M5416 Radiculopathy, lumbar region: Secondary | ICD-10-CM | POA: Diagnosis not present

## 2018-09-30 DIAGNOSIS — M5416 Radiculopathy, lumbar region: Secondary | ICD-10-CM | POA: Diagnosis not present

## 2018-10-07 DIAGNOSIS — E349 Endocrine disorder, unspecified: Secondary | ICD-10-CM | POA: Diagnosis not present

## 2018-10-07 DIAGNOSIS — E559 Vitamin D deficiency, unspecified: Secondary | ICD-10-CM | POA: Diagnosis not present

## 2018-10-07 DIAGNOSIS — E039 Hypothyroidism, unspecified: Secondary | ICD-10-CM | POA: Diagnosis not present

## 2018-10-07 DIAGNOSIS — M5416 Radiculopathy, lumbar region: Secondary | ICD-10-CM | POA: Diagnosis not present

## 2018-10-13 DIAGNOSIS — M5416 Radiculopathy, lumbar region: Secondary | ICD-10-CM | POA: Diagnosis not present

## 2018-10-13 DIAGNOSIS — Z20828 Contact with and (suspected) exposure to other viral communicable diseases: Secondary | ICD-10-CM | POA: Diagnosis not present

## 2018-10-20 DIAGNOSIS — M9903 Segmental and somatic dysfunction of lumbar region: Secondary | ICD-10-CM | POA: Diagnosis not present

## 2018-10-20 DIAGNOSIS — M9905 Segmental and somatic dysfunction of pelvic region: Secondary | ICD-10-CM | POA: Diagnosis not present

## 2018-10-20 DIAGNOSIS — M9904 Segmental and somatic dysfunction of sacral region: Secondary | ICD-10-CM | POA: Diagnosis not present

## 2018-10-20 DIAGNOSIS — M545 Low back pain: Secondary | ICD-10-CM | POA: Diagnosis not present

## 2018-10-27 DIAGNOSIS — M9903 Segmental and somatic dysfunction of lumbar region: Secondary | ICD-10-CM | POA: Diagnosis not present

## 2018-10-27 DIAGNOSIS — M9905 Segmental and somatic dysfunction of pelvic region: Secondary | ICD-10-CM | POA: Diagnosis not present

## 2018-10-27 DIAGNOSIS — M9904 Segmental and somatic dysfunction of sacral region: Secondary | ICD-10-CM | POA: Diagnosis not present

## 2018-10-27 DIAGNOSIS — M5442 Lumbago with sciatica, left side: Secondary | ICD-10-CM | POA: Diagnosis not present

## 2018-10-27 DIAGNOSIS — M545 Low back pain: Secondary | ICD-10-CM | POA: Diagnosis not present

## 2018-11-02 DIAGNOSIS — M5442 Lumbago with sciatica, left side: Secondary | ICD-10-CM | POA: Diagnosis not present

## 2018-11-02 DIAGNOSIS — M9905 Segmental and somatic dysfunction of pelvic region: Secondary | ICD-10-CM | POA: Diagnosis not present

## 2018-11-02 DIAGNOSIS — M9903 Segmental and somatic dysfunction of lumbar region: Secondary | ICD-10-CM | POA: Diagnosis not present

## 2018-11-02 DIAGNOSIS — M9904 Segmental and somatic dysfunction of sacral region: Secondary | ICD-10-CM | POA: Diagnosis not present

## 2018-11-02 DIAGNOSIS — M545 Low back pain: Secondary | ICD-10-CM | POA: Diagnosis not present

## 2018-11-11 DIAGNOSIS — M9905 Segmental and somatic dysfunction of pelvic region: Secondary | ICD-10-CM | POA: Diagnosis not present

## 2018-11-11 DIAGNOSIS — M9904 Segmental and somatic dysfunction of sacral region: Secondary | ICD-10-CM | POA: Diagnosis not present

## 2018-11-11 DIAGNOSIS — M5442 Lumbago with sciatica, left side: Secondary | ICD-10-CM | POA: Diagnosis not present

## 2018-11-11 DIAGNOSIS — E039 Hypothyroidism, unspecified: Secondary | ICD-10-CM | POA: Diagnosis not present

## 2018-11-11 DIAGNOSIS — M9903 Segmental and somatic dysfunction of lumbar region: Secondary | ICD-10-CM | POA: Diagnosis not present

## 2018-11-11 DIAGNOSIS — E349 Endocrine disorder, unspecified: Secondary | ICD-10-CM | POA: Diagnosis not present

## 2018-11-17 DIAGNOSIS — M9901 Segmental and somatic dysfunction of cervical region: Secondary | ICD-10-CM | POA: Diagnosis not present

## 2018-11-17 DIAGNOSIS — M545 Low back pain: Secondary | ICD-10-CM | POA: Diagnosis not present

## 2018-11-17 DIAGNOSIS — H8110 Benign paroxysmal vertigo, unspecified ear: Secondary | ICD-10-CM | POA: Diagnosis not present

## 2018-11-17 DIAGNOSIS — M9903 Segmental and somatic dysfunction of lumbar region: Secondary | ICD-10-CM | POA: Diagnosis not present

## 2018-12-01 ENCOUNTER — Other Ambulatory Visit: Payer: Self-pay | Admitting: Family Medicine

## 2018-12-01 DIAGNOSIS — M542 Cervicalgia: Secondary | ICD-10-CM | POA: Diagnosis not present

## 2018-12-01 DIAGNOSIS — M9904 Segmental and somatic dysfunction of sacral region: Secondary | ICD-10-CM | POA: Diagnosis not present

## 2018-12-01 DIAGNOSIS — M5442 Lumbago with sciatica, left side: Secondary | ICD-10-CM | POA: Diagnosis not present

## 2018-12-01 DIAGNOSIS — M9901 Segmental and somatic dysfunction of cervical region: Secondary | ICD-10-CM | POA: Diagnosis not present

## 2018-12-01 MED ORDER — ESCITALOPRAM OXALATE 5 MG PO TABS: 5.0000 mg | ORAL_TABLET | Freq: Every day | ORAL | 0 refills | Status: DC

## 2018-12-01 NOTE — Telephone Encounter (Signed)
Rx sent 

## 2018-12-01 NOTE — Telephone Encounter (Signed)
Copied from [place] [phone]. Topic: Quick Communication - Rx Refill/Question >> Dec 01, 2018  1:46 PM Nils Flack, [name] wrote: Medication: escitalopram (LEXAPRO) 5 MG tablet #90  Has the patient contacted their pharmacy? Yes.   (Agent: If no, request that the patient contact the pharmacy for the refill.) (Agent: If yes, when and what did the pharmacy advise?)  Preferred Pharmacy (with phone number or street name): [facility]   Agent: Please be advised that RX refills Magie take up to 3 business days. We ask that you follow-up with your pharmacy.

## 2018-12-05 ENCOUNTER — Other Ambulatory Visit: Payer: Self-pay | Admitting: Family Medicine

## 2018-12-07 MED ORDER — ESCITALOPRAM OXALATE 5 MG PO TABS: 5.0000 mg | ORAL_TABLET | Freq: Every day | ORAL | 0 refills | Status: DC

## 2018-12-16 DIAGNOSIS — M5442 Lumbago with sciatica, left side: Secondary | ICD-10-CM | POA: Diagnosis not present

## 2018-12-16 DIAGNOSIS — M9901 Segmental and somatic dysfunction of cervical region: Secondary | ICD-10-CM | POA: Diagnosis not present

## 2018-12-16 DIAGNOSIS — M542 Cervicalgia: Secondary | ICD-10-CM | POA: Diagnosis not present

## 2018-12-16 DIAGNOSIS — M9904 Segmental and somatic dysfunction of sacral region: Secondary | ICD-10-CM | POA: Diagnosis not present

## 2018-12-23 ENCOUNTER — Encounter: Payer: Self-pay | Admitting: Family Medicine

## 2018-12-29 NOTE — Telephone Encounter (Signed)
Awaiting on PCP to sign forms

## 2019-01-05 ENCOUNTER — Telehealth: Payer: Self-pay | Admitting: Family Medicine

## 2019-01-05 NOTE — Telephone Encounter (Signed)
Copied from [place] [phone]. Topic: General - Other >> Jan 05, 2019  4:22 PM Keene Breath wrote: Reason for CRM: Patient called to ask if the forms she requested have been sign and mailed.  Patient would like for the nurse or doctor to notify her once these forms have been mailed.  CB# [phone]

## 2019-01-06 DIAGNOSIS — M542 Cervicalgia: Secondary | ICD-10-CM | POA: Diagnosis not present

## 2019-01-06 DIAGNOSIS — M9901 Segmental and somatic dysfunction of cervical region: Secondary | ICD-10-CM | POA: Diagnosis not present

## 2019-01-06 DIAGNOSIS — M9904 Segmental and somatic dysfunction of sacral region: Secondary | ICD-10-CM | POA: Diagnosis not present

## 2019-01-06 DIAGNOSIS — M5442 Lumbago with sciatica, left side: Secondary | ICD-10-CM | POA: Diagnosis not present

## 2019-01-07 NOTE — Telephone Encounter (Signed)
Have you seen these forms? 

## 2019-01-07 NOTE — Telephone Encounter (Signed)
Yes I did them a couple of days ago. Not sure where they are now.

## 2019-01-08 NOTE — Telephone Encounter (Signed)
Patient notified

## 2019-01-08 NOTE — Telephone Encounter (Signed)
Faxed paperwork over today with confirmation today

## 2019-01-14 DIAGNOSIS — Z23 Encounter for immunization: Secondary | ICD-10-CM | POA: Diagnosis not present

## 2019-01-19 DIAGNOSIS — M5442 Lumbago with sciatica, left side: Secondary | ICD-10-CM | POA: Diagnosis not present

## 2019-01-19 DIAGNOSIS — M9901 Segmental and somatic dysfunction of cervical region: Secondary | ICD-10-CM | POA: Diagnosis not present

## 2019-01-19 DIAGNOSIS — M542 Cervicalgia: Secondary | ICD-10-CM | POA: Diagnosis not present

## 2019-01-19 DIAGNOSIS — M9903 Segmental and somatic dysfunction of lumbar region: Secondary | ICD-10-CM | POA: Diagnosis not present

## 2019-02-16 DIAGNOSIS — M5416 Radiculopathy, lumbar region: Secondary | ICD-10-CM | POA: Diagnosis not present

## 2019-02-25 DIAGNOSIS — M5416 Radiculopathy, lumbar region: Secondary | ICD-10-CM | POA: Diagnosis not present

## 2019-03-04 ENCOUNTER — Other Ambulatory Visit: Payer: Self-pay | Admitting: Sports Medicine

## 2019-03-04 DIAGNOSIS — M5416 Radiculopathy, lumbar region: Secondary | ICD-10-CM | POA: Diagnosis not present

## 2019-03-08 ENCOUNTER — Encounter: Payer: Medicare Other | Admitting: Family Medicine

## 2019-03-10 DIAGNOSIS — M5416 Radiculopathy, lumbar region: Secondary | ICD-10-CM | POA: Diagnosis not present

## 2019-03-16 ENCOUNTER — Ambulatory Visit: Payer: Medicare Other | Admitting: *Deleted

## 2019-03-18 ENCOUNTER — Ambulatory Visit: Payer: Medicare Other | Admitting: *Deleted

## 2019-03-18 DIAGNOSIS — L719 Rosacea, unspecified: Secondary | ICD-10-CM | POA: Diagnosis not present

## 2019-03-18 DIAGNOSIS — D2272 Melanocytic nevi of left lower limb, including hip: Secondary | ICD-10-CM | POA: Diagnosis not present

## 2019-03-18 DIAGNOSIS — D2261 Melanocytic nevi of right upper limb, including shoulder: Secondary | ICD-10-CM | POA: Diagnosis not present

## 2019-03-18 DIAGNOSIS — D2371 Other benign neoplasm of skin of right lower limb, including hip: Secondary | ICD-10-CM | POA: Diagnosis not present

## 2019-03-18 DIAGNOSIS — D225 Melanocytic nevi of trunk: Secondary | ICD-10-CM | POA: Diagnosis not present

## 2019-03-18 DIAGNOSIS — D2239 Melanocytic nevi of other parts of face: Secondary | ICD-10-CM | POA: Diagnosis not present

## 2019-03-18 DIAGNOSIS — Z86008 Personal history of in-situ neoplasm of other site: Secondary | ICD-10-CM | POA: Diagnosis not present

## 2019-03-18 DIAGNOSIS — D485 Neoplasm of uncertain behavior of skin: Secondary | ICD-10-CM | POA: Diagnosis not present

## 2019-03-18 DIAGNOSIS — D2271 Melanocytic nevi of right lower limb, including hip: Secondary | ICD-10-CM | POA: Diagnosis not present

## 2019-03-18 DIAGNOSIS — Z23 Encounter for immunization: Secondary | ICD-10-CM | POA: Diagnosis not present

## 2019-03-18 DIAGNOSIS — Z86018 Personal history of other benign neoplasm: Secondary | ICD-10-CM | POA: Diagnosis not present

## 2019-03-18 DIAGNOSIS — D2361 Other benign neoplasm of skin of right upper limb, including shoulder: Secondary | ICD-10-CM | POA: Diagnosis not present

## 2019-03-23 DIAGNOSIS — M5416 Radiculopathy, lumbar region: Secondary | ICD-10-CM | POA: Diagnosis not present

## 2019-03-29 NOTE — Progress Notes (Signed)
Virtual Visit via Video Note  I connected with patient on 03/30/19 at  3:00 PM EST by audio enabled telemedicine application and verified that I am speaking with the correct person using two identifiers.   THIS ENCOUNTER IS A VIRTUAL VISIT DUE TO COVID-19 - PATIENT WAS NOT SEEN IN THE OFFICE. PATIENT HAS CONSENTED TO VIRTUAL VISIT / TELEMEDICINE VISIT   Location of patient: home  Location of provider: office  I discussed the limitations of evaluation and management by telemedicine and the availability of in person appointments. The patient expressed understanding and agreed to proceed.   Subjective:   Shannon [name] is a 68 y.o. female who presents for Medicare Annual (Subsequent) preventive examination.  Review of Systems:  Home Safety/Smoke Alarms: Feels safe in home. Smoke alarms in place.  Lives alone in 2 story home.    Female:   Mammo- 06/03/18       Dexa scan-   06/03/18     CCS- 03/07/16- normal    Objective:     Vitals: Unable to assess. This visit is enabled though telemedicine due to Covid 19.   Advanced Directives 03/30/2019 03/12/2018 02/24/2017 02/22/2016 02/22/2016 04/05/2015  Does Patient Have a Medical Advance Directive? Yes Yes Yes Yes Yes No  Type of Paramedic of [place];Living will [place];Living will [place];Living will [place];Living will [place];Living will -  Does patient want to make changes to medical advance directive? No - Patient declined - No - Patient declined - - -  Copy of [place] in Chart? Yes - validated most recent copy scanned in chart (See row information) Yes - validated most recent copy scanned in chart (See row information) No - copy requested No - copy requested - -  Would patient like information on creating a medical advance directive? - - - - - No - patient declined information    Tobacco Social History    Tobacco Use  Smoking Status Never Smoker  Smokeless Tobacco Never Used     Counseling given: Not Answered   Clinical Intake: Pain : No/denies pain   Past Medical History:  Diagnosis Date   ADD (attention deficit disorder with hyperactivity)    Anxiety    Asthma    Cancer ([place])    melanoma- lip 2011   Depression    Diverticulosis    Hyperlipidemia, mixed 03/11/2016   Hypothyroidism    Menopause    Osteopenia    Rosacea    Sciatica of left side 02/24/2017   Travel advice encounter 03/25/2016   Past Surgical History:  Procedure Laterality Date   blephoroplasty     BROW LIFT     DILATION AND CURETTAGE OF UTERUS     SKIN CANCER EXCISION     right lower lip   TONSILLECTOMY AND ADENOIDECTOMY     Family History  Problem Relation Age of Onset   Colon polyps Mother    Parkinson's disease Mother    Diabetes Father    Emphysema Father    Tuberculosis Father    Colon cancer Maternal Grandfather    Alcohol abuse Sister    Cancer Sister        skin cancer, melanoma cell, basil cell   ADD / ADHD Daughter    Anxiety disorder Daughter    Depression Daughter    ADD / ADHD Son    Anxiety disorder Son    Depression Son  Cancer Sister        melanoma   Anxiety disorder Brother    Social History   Socioeconomic History   Marital status: Single    Spouse name: Not on file   Number of children: 2   Years of education: Not on file   Highest education level: Not on file  Occupational History   Occupation: Retired Probation officer strain: Not on file   Food insecurity    Worry: Not on file    Inability: Not on Lexicographer needs    Medical: Not on file    Non-medical: Not on file  Tobacco Use   Smoking status: Never Smoker   Smokeless tobacco: Never Used  Substance and Sexual Activity   Alcohol use: Yes    Alcohol/week: 3.0 - 4.0 standard drinks    Types: 1 - 2 Glasses of wine,  1 Cans of beer, 1 Shots of liquor per week    Comment: not every night.   Drug use: No   Sexual activity: Not on file  Lifestyle   Physical activity    Days per week: Not on file    Minutes per session: Not on file   Stress: Not on file  Relationships   Social connections    Talks on phone: Not on file    Gets together: Not on file    Attends religious service: Not on file    Active member of club or organization: Not on file    Attends meetings of clubs or organizations: Not on file    Relationship status: Not on file  Other Topics Concern   Not on file  Social History Narrative   Retired from [employer].   No major dietary restrictions except avoid gluten.        Outpatient Encounter Medications as of 03/30/2019  Medication Sig   Cholecalciferol (VITAMIN D) 2000 UNITS CAPS Take by mouth 2 (two) times daily.    escitalopram (LEXAPRO) 5 MG tablet Take 1 tablet (5 mg total) by mouth daily. NEEDS OV FOR FURTHER REFILLS   gabapentin (NEURONTIN) 300 MG capsule TAKE (1) CAPSULE THREE TIMES DAILY.   GAMMA AMINOBUTYRIC ACID PO Take 200 mg by mouth as needed. Up to bid   hydrocortisone (PROCTOZONE-HC) 2.5 % rectal cream Place 1 application rectally 2 (two) times daily.   Krill Oil 500 MG CAPS    LORazepam (ATIVAN) 2 MG tablet Take 1 tablet (2 mg total) by mouth every 8 (eight) hours as needed for anxiety.   MAGNESIUM GLYCINATE PLUS PO Take 100 mg by mouth 2 (two) times daily.   thyroid (ARMOUR THYROID) 15 MG tablet Take by mouth. 7.5 mg qd   levothyroxine (SYNTHROID, LEVOTHROID) 50 MCG tablet Take 1 tablet (50 mcg total) by mouth daily.   nystatin ointment (MYCOSTATIN)    [DISCONTINUED] fluticasone (FLONASE) 50 MCG/ACT nasal spray Place 2 sprays into both nostrils daily.   [DISCONTINUED] OVER THE COUNTER MEDICATION Influenzinum 30x   4 pellets 1 time a week.   No facility-administered encounter medications on file as of 03/30/2019.     Activities of  Daily Living In your present state of health, do you have any difficulty performing the following activities: 03/30/2019  Hearing? N  Vision? N  Difficulty concentrating or making decisions? N  Walking or climbing stairs? N  Dressing or bathing? N  Doing errands, shopping? N  Preparing Food and eating ? N  Using the Toilet?  N  In the past six months, have you accidently leaked urine? N  Do you have problems with loss of bowel control? N  Managing your Medications? N  Managing your Finances? N  Housekeeping or managing your Housekeeping? N  Some recent data might be hidden    Patient Care Team: Mosie Lukes, MD as PCP - General (Family Medicine)    Assessment:   This is a routine wellness examination for [place]. Physical assessment deferred to PCP.  Exercise Activities and Dietary recommendations Current Exercise Habits: Home exercise routine, Type of exercise: walking, Time (Minutes): 30, Frequency (Times/Week): 3, Weekly Exercise (Minutes/Week): 90, Exercise limited by: None identified   Diet (meal preparation, eat out, water intake, caffeinated beverages, dairy products, fruits and vegetables): in general, a "healthy" diet  , well balanced   Goals     Patient Stated     Control sugar intake        Fall Risk Fall Risk  03/12/2018 02/24/2017 02/22/2016 04/05/2015 03/13/2015  Falls in the past year? 0 No No No No  Risk for fall due to : - - - Other (Comment) -    Depression Screen PHQ 2/9 Scores 03/12/2018 02/24/2017 02/22/2016 04/05/2015  PHQ - 2 Score 0 0 0 0  Exception Documentation - - - Other- indicate reason in comment box     Cognitive Functiond Ad8 score reviewed for issues:  Issues making decisions:no  Less interest in hobbies / activities: no  Repeats questions, stories (family complaining):no  Trouble using ordinary gadgets (microwave, computer, phone):no  Forgets the month or year: no  Mismanaging finances: no  Remembering appts:no  Daily  problems with thinking and/or memory:no Ad8 score is=0        Immunization History  Administered Date(s) Administered   Influenza Split 03/08/2013   Influenza-Unspecified 03/07/2014, 03/10/2018   PPD Test 03/08/2013, 02/28/2014, 03/13/2015   Pneumococcal Conjugate-13 03/11/2016   Pneumococcal Polysaccharide-23 03/02/2018   Pneumococcal-Unspecified 04/08/2011   Tdap 09/05/2009   Zoster 02/28/2014   Zoster Recombinat (Shingrix) 03/18/2018, 05/26/2018    Screening Tests Health Maintenance  Topic Date Due   INFLUENZA VACCINE  11/28/2018   TETANUS/TDAP  09/06/2019   MAMMOGRAM  06/03/2020   COLONOSCOPY  03/07/2026   DEXA SCAN  Completed   Hepatitis C Screening  Completed   PNA vac Low Risk Adult  Completed      Plan:   See you next year!  Continue to eat heart healthy diet (full of fruits, vegetables, whole grains, lean protein, water--limit salt, fat, and sugar intake) and increase physical activity as tolerated.  Continue doing brain stimulating activities (puzzles, reading, adult coloring books, staying active) to keep memory sharp.    I have personally reviewed and noted the following in the patients chart:    Medical and social history  Use of alcohol, tobacco or illicit drugs   Current medications and supplements  Functional ability and status  Nutritional status  Physical activity  Advanced directives  List of other physicians  Hospitalizations, surgeries, and ER visits in previous 12 months  Vitals  Screenings to include cognitive, depression, and falls  Referrals and appointments  In addition, I have reviewed and discussed with patient certain preventive protocols, quality metrics, and best practice recommendations. A written personalized care plan for preventive services as well as general preventive health recommendations were provided to patient.     Shela Nevin, [place]  03/30/2019

## 2019-03-30 ENCOUNTER — Ambulatory Visit (INDEPENDENT_AMBULATORY_CARE_PROVIDER_SITE_OTHER): Payer: Medicare Other | Admitting: *Deleted

## 2019-03-30 ENCOUNTER — Encounter: Payer: Self-pay | Admitting: *Deleted

## 2019-03-30 ENCOUNTER — Other Ambulatory Visit: Payer: Self-pay

## 2019-03-30 DIAGNOSIS — Z0000 Encounter for general adult medical examination without abnormal findings: Secondary | ICD-10-CM | POA: Diagnosis not present

## 2019-03-30 NOTE — Patient Instructions (Addendum)
See Shannon next year!  Continue to eat heart healthy diet (full of fruits, vegetables, whole grains, lean protein, water--limit salt, fat, and sugar intake) and increase physical activity as tolerated.  Continue doing brain stimulating activities (puzzles, reading, adult coloring books, staying active) to keep memory sharp.    Shannon [name] , Thank Shannon for taking time to come for your Medicare Wellness Visit. I appreciate your ongoing commitment to your health goals. Please review the following plan we discussed and let me know if I can assist Shannon in the future.   These are the goals we discussed: Goals    . Patient Stated     Control sugar intake        This is a list of the screening recommended for Shannon and due dates:  Health Maintenance  Topic Date Due  . Flu Shot  11/28/2018  . Tetanus Vaccine  09/06/2019  . Mammogram  06/03/2020  . Colon Cancer Screening  03/07/2026  . DEXA scan (bone density measurement)  Completed  .  Hepatitis C: One time screening is recommended by Center for Disease Control  (CDC) for  adults born from [birth year] through 1965.   Completed  . Pneumonia vaccines  Completed    Preventive Care 14 Years and Older, Female Preventive care refers to lifestyle choices and visits with your health care provider that can promote health and wellness. This includes:  A yearly physical exam. This is also called an annual well check.  Regular dental and eye exams.  Immunizations.  Screening for certain conditions.  Healthy lifestyle choices, such as diet and exercise. What can I expect for my preventive care visit? Physical exam Your health care provider will check:  Height and weight. These Shannon [name] be used to calculate body mass index (BMI), which is a measurement that tells if Shannon are at a healthy weight.  Heart rate and blood pressure.  Your skin for abnormal spots. Counseling Your health care provider Shannon [name] ask Shannon questions about:  Alcohol, tobacco, and drug  use.  Emotional well-being.  Home and relationship well-being.  Sexual activity.  Eating habits.  History of falls.  Memory and ability to understand (cognition).  Work and work Statistician.  Pregnancy and menstrual history. What immunizations do I need?  Influenza (flu) vaccine  This is recommended every year. Tetanus, diphtheria, and pertussis (Tdap) vaccine  Shannon [name] need a Td booster every 10 years. Varicella (chickenpox) vaccine  Shannon Shannon [name] need this vaccine if Shannon have not already been vaccinated. Zoster (shingles) vaccine  Shannon Shannon [name] need this after age 61. Pneumococcal conjugate (PCV13) vaccine  One dose is recommended after age 50. Pneumococcal polysaccharide (PPSV23) vaccine  One dose is recommended after age 61. Measles, mumps, and rubella (MMR) vaccine  Shannon Shannon [name] need at least one dose of MMR if Shannon were born in 1957 or later. Shannon Shannon [name] also need a second dose. Meningococcal conjugate (MenACWY) vaccine  Shannon Shannon [name] need this if Shannon have certain conditions. Hepatitis A vaccine  Shannon Shannon [name] need this if Shannon have certain conditions or if Shannon travel or work in places where Shannon Shannon [name] be exposed to hepatitis A. Hepatitis B vaccine  Shannon Shannon [name] need this if Shannon have certain conditions or if Shannon travel or work in places where Shannon Shannon [name] be exposed to hepatitis B. Haemophilus influenzae type b (Hib) vaccine  Shannon Shannon [name] need this if Shannon have certain conditions. Shannon Shannon [name] receive vaccines as individual doses or as more than one vaccine together in  one shot (combination vaccines). Talk with your health care provider about the risks and benefits of combination vaccines. What tests do I need? Blood tests  Lipid and cholesterol levels. These Shannon [name] be checked every 5 years, or more frequently depending on your overall health.  Hepatitis C test.  Hepatitis B test. Screening  Lung cancer screening. Shannon Shannon [name] have this screening every year starting at age 21 if Shannon have a 30-pack-year history of  smoking and currently smoke or have quit within the past 15 years.  Colorectal cancer screening. All adults should have this screening starting at age 66 and continuing until age 42. Your health care provider Shannon [name] recommend screening at age 53 if Shannon are at increased risk. Shannon will have tests every 1-10 years, depending on your results and the type of screening test.  Diabetes screening. This is done by checking your blood sugar (glucose) after Shannon have not eaten for a while (fasting). Shannon Shannon [name] have this done every 1-3 years.  Mammogram. This Shannon [name] be done every 1-2 years. Talk with your health care provider about how often Shannon should have regular mammograms.  BRCA-related cancer screening. This Shannon [name] be done if Shannon have a family history of breast, ovarian, tubal, or peritoneal cancers. Other tests  Sexually transmitted disease (STD) testing.  Bone density scan. This is done to screen for osteoporosis. Shannon [name] have this done starting at age 83. Follow these instructions at home: Eating and drinking  Eat a diet that includes fresh fruits and vegetables, whole grains, lean protein, and low-fat dairy products. Limit your intake of foods with high amounts of sugar, saturated fats, and salt.  Take vitamin and mineral supplements as recommended by your health care provider.  Do not drink alcohol if your health care provider tells Shannon not to drink.  If Shannon drink alcohol: ? Limit how much Shannon have to 0-1 drink a day. ? Be aware of how much alcohol is in your drink. In the [place] one drink equals one 12 oz bottle of beer (355 mL), one 5 oz glass of wine (148 mL), or one 1 oz glass of hard liquor (44 mL). Lifestyle  Take daily care of your teeth and gums.  Stay active. Exercise for at least 30 minutes on 5 or more days each week.  Do not use any products that contain nicotine or tobacco, such as cigarettes, e-cigarettes, and chewing tobacco. If Shannon need help quitting, ask your health care  provider.  If Shannon are sexually active, practice safe sex. Use a condom or other form of protection in order to prevent STIs (sexually transmitted infections).  Talk with your health care provider about taking a low-dose aspirin or statin. What's next?  Go to your health care provider once a year for a well check visit.  Ask your health care provider how often Shannon should have your eyes and teeth checked.  Stay up to date on all vaccines. This information is not intended to replace advice given to Shannon by your health care provider. Make sure Shannon discuss any questions Shannon have with your health care provider. Document Released: 05/12/2015 Document Revised: 04/09/2018 Document Reviewed: 04/09/2018 Elsevier Patient Education  2020 [employer].

## 2019-04-02 ENCOUNTER — Other Ambulatory Visit: Payer: Self-pay

## 2019-04-05 ENCOUNTER — Encounter: Payer: Medicare Other | Admitting: Family Medicine

## 2019-04-06 ENCOUNTER — Ambulatory Visit: Payer: Medicare Other | Admitting: Family Medicine

## 2019-04-07 ENCOUNTER — Encounter: Payer: Self-pay | Admitting: Family Medicine

## 2019-04-07 DIAGNOSIS — E559 Vitamin D deficiency, unspecified: Secondary | ICD-10-CM | POA: Diagnosis not present

## 2019-04-07 DIAGNOSIS — E039 Hypothyroidism, unspecified: Secondary | ICD-10-CM | POA: Diagnosis not present

## 2019-04-07 DIAGNOSIS — E349 Endocrine disorder, unspecified: Secondary | ICD-10-CM | POA: Diagnosis not present

## 2019-04-08 ENCOUNTER — Ambulatory Visit: Payer: Medicare Other | Admitting: Family Medicine

## 2019-04-08 ENCOUNTER — Ambulatory Visit (INDEPENDENT_AMBULATORY_CARE_PROVIDER_SITE_OTHER): Payer: Medicare Other | Admitting: Family Medicine

## 2019-04-08 ENCOUNTER — Other Ambulatory Visit: Payer: Self-pay

## 2019-04-08 ENCOUNTER — Other Ambulatory Visit (HOSPITAL_COMMUNITY)
Admission: RE | Admit: 2019-04-08 | Discharge: 2019-04-08 | Disposition: A | Discharge status: Home / Self Care | Payer: Medicare Other | Source: Ambulatory Visit | Attending: Family Medicine | Admitting: Family Medicine

## 2019-04-08 DIAGNOSIS — R0989 Other specified symptoms and signs involving the circulatory and respiratory systems: Secondary | ICD-10-CM

## 2019-04-08 DIAGNOSIS — Z1151 Encounter for screening for human papillomavirus (HPV): Secondary | ICD-10-CM | POA: Insufficient documentation

## 2019-04-08 DIAGNOSIS — Z124 Encounter for screening for malignant neoplasm of cervix: Secondary | ICD-10-CM

## 2019-04-08 DIAGNOSIS — Z780 Asymptomatic menopausal state: Secondary | ICD-10-CM | POA: Insufficient documentation

## 2019-04-08 DIAGNOSIS — E039 Hypothyroidism, unspecified: Secondary | ICD-10-CM | POA: Diagnosis not present

## 2019-04-08 DIAGNOSIS — R739 Hyperglycemia, unspecified: Secondary | ICD-10-CM

## 2019-04-08 DIAGNOSIS — E782 Mixed hyperlipidemia: Secondary | ICD-10-CM

## 2019-04-08 DIAGNOSIS — R42 Dizziness and giddiness: Secondary | ICD-10-CM

## 2019-04-08 DIAGNOSIS — E559 Vitamin D deficiency, unspecified: Secondary | ICD-10-CM

## 2019-04-08 DIAGNOSIS — Z0000 Encounter for general adult medical examination without abnormal findings: Secondary | ICD-10-CM | POA: Diagnosis not present

## 2019-04-08 MED ORDER — ESCITALOPRAM OXALATE 10 MG PO TABS: 10.0000 mg | ORAL_TABLET | Freq: Every day | ORAL | 5 refills | Status: DC

## 2019-04-08 MED ORDER — LORAZEPAM 2 MG PO TABS: 2.0000 mg | ORAL_TABLET | Freq: Three times a day (TID) | ORAL | 0 refills | Status: DC | PRN

## 2019-04-08 NOTE — Assessment & Plan Note (Signed)
Pap today, no concerns on exam.  

## 2019-04-08 NOTE — Patient Instructions (Addendum)
Multivitamin with minerals, selenium, zinc 30-50 mg vitamin d and c.  Quercetin helps to absorb the Coyle makes all of these  ECASA 81 mg daily   Melatonin 1-5 mg at bedtime   Omron Blood pressure, upper arm  Pulse oximeter, want oxygen in the 90s  Weekly vials   [email or web address]  SL Vitamin B12 Preventive Care 65 Years and Older, Female Preventive care refers to lifestyle choices and visits with your health care provider that can promote health and wellness. This includes:  A yearly physical exam. This is also called an annual well check.  Regular dental and eye exams.  Immunizations.  Screening for certain conditions.  Healthy lifestyle choices, such as diet and exercise. What can I expect for my preventive care visit? Physical exam Your health care provider will check:  Height and weight. These Jaena be used to calculate body mass index (BMI), which is a measurement that tells if you are at a healthy weight.  Heart rate and blood pressure.  Your skin for abnormal spots. Counseling Your health care provider Ashyia ask you questions about:  Alcohol, tobacco, and drug use.  Emotional well-being.  Home and relationship well-being.  Sexual activity.  Eating habits.  History of falls.  Memory and ability to understand (cognition).  Work and work Statistician.  Pregnancy and menstrual history. What immunizations do I need?  Influenza (flu) vaccine  This is recommended every year. Tetanus, diphtheria, and pertussis (Tdap) vaccine  You Lina need a Td booster every 10 years. Varicella (chickenpox) vaccine  You Latorsha need this vaccine if you have not already been vaccinated. Zoster (shingles) vaccine  You Shantae need this after age 66. Pneumococcal conjugate (PCV13) vaccine  One dose is recommended after age 33. Pneumococcal polysaccharide (PPSV23) vaccine  One dose is recommended after age 22. Measles, mumps, and rubella (MMR) vaccine  You  Markan need at least one dose of MMR if you were born in 1957 or later. You Etna also need a second dose. Meningococcal conjugate (MenACWY) vaccine  You Ophia need this if you have certain conditions. Hepatitis A vaccine  You Trenisha need this if you have certain conditions or if you travel or work in places where you Delrae be exposed to hepatitis A. Hepatitis B vaccine  You Francille need this if you have certain conditions or if you travel or work in places where you Ellagrace be exposed to hepatitis B. Haemophilus influenzae type b (Hib) vaccine  You Gabrille need this if you have certain conditions. You Silveria receive vaccines as individual doses or as more than one vaccine together in one shot (combination vaccines). Talk with your health care provider about the risks and benefits of combination vaccines. What tests do I need? Blood tests  Lipid and cholesterol levels. These Eleena be checked every 5 years, or more frequently depending on your overall health.  Hepatitis C test.  Hepatitis B test. Screening  Lung cancer screening. You Solara have this screening every year starting at age 43 if you have a 30-pack-year history of smoking and currently smoke or have quit within the past 15 years.  Colorectal cancer screening. All adults should have this screening starting at age 48 and continuing until age 41. Your health care provider Katya recommend screening at age 43 if you are at increased risk. You will have tests every 1-10 years, depending on your results and the type of screening test.  Diabetes screening. This is done by checking your blood sugar (  glucose) after you have not eaten for a while (fasting). You Emery have this done every 1-3 years.  Mammogram. This Martin be done every 1-2 years. Talk with your health care provider about how often you should have regular mammograms.  BRCA-related cancer screening. This Aleta be done if you have a family history of breast, ovarian, tubal, or peritoneal cancers. Other  tests  Sexually transmitted disease (STD) testing.  Bone density scan. This is done to screen for osteoporosis. You Kissa have this done starting at age 21. Follow these instructions at home: Eating and drinking  Eat a diet that includes fresh fruits and vegetables, whole grains, lean protein, and low-fat dairy products. Limit your intake of foods with high amounts of sugar, saturated fats, and salt.  Take vitamin and mineral supplements as recommended by your health care provider.  Do not drink alcohol if your health care provider tells you not to drink.  If you drink alcohol: ? Limit how much you have to 0-1 drink a day. ? Be aware of how much alcohol is in your drink. In the [place] one drink equals one 12 oz bottle of beer (355 mL), one 5 oz glass of wine (148 mL), or one 1 oz glass of hard liquor (44 mL). Lifestyle  Take daily care of your teeth and gums.  Stay active. Exercise for at least 30 minutes on 5 or more days each week.  Do not use any products that contain nicotine or tobacco, such as cigarettes, e-cigarettes, and chewing tobacco. If you need help quitting, ask your health care provider.  If you are sexually active, practice safe sex. Use a condom or other form of protection in order to prevent STIs (sexually transmitted infections).  Talk with your health care provider about taking a low-dose aspirin or statin. What's next?  Go to your health care provider once a year for a well check visit.  Ask your health care provider how often you should have your eyes and teeth checked.  Stay up to date on all vaccines. This information is not intended to replace advice given to you by your health care provider. Make sure you discuss any questions you have with your health care provider. Document Released: 05/12/2015 Document Revised: 04/09/2018 Document Reviewed: 04/09/2018 Elsevier Patient Education  [place].  Vertigo Vertigo is the feeling that you or your  surroundings are moving when they are not. This feeling can come and go at any time. Vertigo often goes away on its own. Vertigo can be dangerous if it occurs while you are doing something that could endanger you or others, such as driving or operating machinery. Your health care provider will do tests to determine the cause of your vertigo. Tests will also help your health care provider decide how best to treat your condition. Follow these instructions at home: Eating and drinking      Drink enough fluid to keep your urine pale yellow.  Do not drink alcohol. Activity  Return to your normal activities as told by your health care provider. Ask your health care provider what activities are safe for you.  In the morning, first sit up on the side of the bed. When you feel okay, stand slowly while you hold onto something until you know that your balance is fine.  Move slowly. Avoid sudden body or head movements or certain positions, as told by your health care provider.  If you have trouble walking or keeping your balance, try using a  cane for stability. If you feel dizzy or unstable, sit down right away.  Avoid doing any tasks that would cause danger to you or others if vertigo occurs.  Avoid bending down if you feel dizzy. Place items in your home so that they are easy for you to reach without leaning over.  Do not drive or use heavy machinery if you feel dizzy. General instructions  Take over-the-counter and prescription medicines only as told by your health care provider.  Keep all follow-up visits as told by your health care provider. This is important. Contact a health care provider if:  Your medicines do not relieve your vertigo or they make it worse.  You have a fever.  Your condition gets worse or you develop new symptoms.  Your family or friends notice any behavioral changes.  Your nausea or vomiting gets worse.  You have numbness or a prickling and tingling sensation  in part of your body. Get help right away if you:  Have difficulty moving or speaking.  Are always dizzy.  Faint.  Develop severe headaches.  Have weakness in your hands, arms, or legs.  Have changes in your hearing or vision.  Develop a stiff neck.  Develop sensitivity to light. Summary  Vertigo is the feeling that you or your surroundings are moving when they are not.  Your health care provider will do tests to determine the cause of your vertigo.  Follow instructions for home care. You Keenya be told to avoid certain tasks, positions, or movements.  Contact a health care provider if your medicines do not relieve your symptoms, or if you have a fever, nausea, vomiting, or changes in behavior.  Get help right away if you have severe headaches or difficulty speaking, or you develop hearing or vision problems. This information is not intended to replace advice given to you by your health care provider. Make sure you discuss any questions you have with your health care provider. Document Released: 01/23/2005 Document Revised: 03/09/2018 Document Reviewed: 03/09/2018 Elsevier Patient Education  2020 [employer].

## 2019-04-09 LAB — CBC
HCT: 41.6 % (ref 36.0–46.0)
Hemoglobin: 14 g/dL (ref 12.0–15.0)
MCHC: 33.6 g/dL (ref 30.0–36.0)
MCV: 97.7 fl (ref 78.0–100.0)
Platelets: 253 10*3/uL (ref 150.0–400.0)
RBC: 4.26 Mil/uL (ref 3.87–5.11)
RDW: 12.1 % (ref 11.5–15.5)
WBC: 6.3 10*3/uL (ref 4.0–10.5)

## 2019-04-09 LAB — LIPID PANEL
Cholesterol: 227 mg/dL — ABNORMAL HIGH (ref 0–200)
HDL: 60.5 mg/dL (ref >39.00)
LDL Cholesterol: 151 mg/dL — ABNORMAL HIGH (ref 0–99)
NonHDL: 166.84
Total CHOL/HDL Ratio: 4
Triglycerides: 81 mg/dL (ref 0.0–149.0)
VLDL: 16.2 mg/dL (ref 0.0–40.0)

## 2019-04-09 LAB — COMPREHENSIVE METABOLIC PANEL
ALT: 19 U/L (ref 0–35)
AST: 27 U/L (ref 0–37)
Albumin: 4.6 g/dL (ref 3.5–5.2)
Alkaline Phosphatase: 58 U/L (ref 39–117)
BUN: 18 mg/dL (ref 6–23)
CO2: 29 mEq/L (ref 19–32)
Calcium: 9.8 mg/dL (ref 8.4–10.5)
Chloride: 103 mEq/L (ref 96–112)
Creatinine, Ser: 0.93 mg/dL (ref 0.40–1.20)
GFR: 59.85 mL/min — ABNORMAL LOW (ref >60.00)
Glucose, Bld: 89 mg/dL (ref 70–99)
Potassium: 4.6 mEq/L (ref 3.5–5.1)
Sodium: 139 mEq/L (ref 135–145)
Total Bilirubin: 1.1 mg/dL (ref 0.2–1.2)
Total Protein: 7.3 g/dL (ref 6.0–8.3)

## 2019-04-09 LAB — HEMOGLOBIN A1C: Hgb A1c MFr Bld: 5.3 % (ref 4.6–6.5)

## 2019-04-09 LAB — THYROID PEROXIDASE ANTIBODY: Thyroperoxidase Ab SerPl-aCnc: 140 IU/mL — ABNORMAL HIGH (ref <9)

## 2019-04-12 ENCOUNTER — Other Ambulatory Visit: Payer: Self-pay

## 2019-04-12 ENCOUNTER — Ambulatory Visit (HOSPITAL_BASED_OUTPATIENT_CLINIC_OR_DEPARTMENT_OTHER)
Admission: RE | Admit: 2019-04-12 | Discharge: 2019-04-12 | Disposition: A | Discharge status: Home / Self Care | Payer: Medicare Other | Source: Ambulatory Visit | Attending: Family Medicine | Admitting: Family Medicine

## 2019-04-12 DIAGNOSIS — M5416 Radiculopathy, lumbar region: Secondary | ICD-10-CM | POA: Diagnosis not present

## 2019-04-12 DIAGNOSIS — R42 Dizziness and giddiness: Secondary | ICD-10-CM | POA: Insufficient documentation

## 2019-04-12 DIAGNOSIS — R0989 Other specified symptoms and signs involving the circulatory and respiratory systems: Secondary | ICD-10-CM | POA: Diagnosis not present

## 2019-04-12 HISTORY — DX: Dizziness and giddiness: R42

## 2019-04-12 HISTORY — DX: Other specified symptoms and signs involving the circulatory and respiratory systems: R09.89

## 2019-04-12 LAB — CYTOLOGY - PAP
Comment: NEGATIVE
Diagnosis: NEGATIVE
High risk HPV: NEGATIVE

## 2019-04-12 NOTE — Assessment & Plan Note (Signed)
Supplement and monitor 

## 2019-04-12 NOTE — Progress Notes (Signed)
Subjective:    Patient ID: Shannon [name], female    DOB: March 31, 1951, 68 y.o.   MRN: [id number]  No chief complaint on file.   HPI Patient is in today for annual preventative exam with pap smear and follow up on chronic medical concerns including hyperlipidemia, depression asthma and more. No female or gyn concerns. No breast lesions or pain. No recent febrile illness or hospitalizations. No polyuria or polydipsia. Denies CP/palp/SOB/HA/congestion/fevers/GI or GU c/o. Taking meds as prescribed. She notes she is struggling with increased anxiety and anhedonia with the pandemic. Is considering increase her Lexapro some. She has had some infrequent episodes of a dizzy sensation. Is maintaining quarantine well. Is trying to maintain a heart healthy diet and stays active  Past Medical History:  Diagnosis Date  . ADD (attention deficit disorder with hyperactivity)   . Anxiety   . Asthma   . Cancer ([place])    melanoma- lip 2011  . Depression   . Diverticulosis   . Hyperlipidemia, mixed 03/11/2016  . Hypothyroidism   . Menopause   . Osteopenia   . Rosacea   . Sciatica of left side 02/24/2017  . Travel advice encounter 03/25/2016    Past Surgical History:  Procedure Laterality Date  . blephoroplasty    . BROW LIFT    . DILATION AND CURETTAGE OF UTERUS    . SKIN CANCER EXCISION     right lower lip  . TONSILLECTOMY AND ADENOIDECTOMY      Family History  Problem Relation Age of Onset  . Colon polyps Mother   . Parkinson's disease Mother   . Diabetes Father   . Emphysema Father   . Tuberculosis Father   . Colon cancer Maternal Grandfather   . Alcohol abuse Sister   . Cancer Sister        skin cancer, melanoma cell, basil cell  . ADD / ADHD Daughter   . Anxiety disorder Daughter   . Depression Daughter   . ADD / ADHD Son   . Anxiety disorder Son   . Depression Son   . Cancer Sister        melanoma  . Anxiety disorder Brother     Social History   Socioeconomic History  .  Marital status: Single    Spouse name: Not on file  . Number of children: 2  . Years of education: Not on file  . Highest education level: Not on file  Occupational History  . Occupation: Retired Tourist information centre manager  Tobacco Use  . Smoking status: Never Smoker  . Smokeless tobacco: Never Used  Substance and Sexual Activity  . Alcohol use: Yes    Alcohol/week: 3.0 - 4.0 standard drinks    Types: 1 - 2 Glasses of wine, 1 Cans of beer, 1 Shots of liquor per week    Comment: not every night.  . Drug use: No  . Sexual activity: Not on file  Other Topics Concern  . Not on file  Social History Narrative   Retired from [employer].   No major dietary restrictions except avoid gluten.       Social Determinants of Health   Financial Resource Strain:   . Difficulty of Paying Living Expenses: Not on file  Food Insecurity:   . Worried About Charity fundraiser in the Last Year: Not on file  . Ran Out of Food in the Last Year: Not on file  Transportation Needs:   . Lack of Transportation (Medical): Not on file  .  Lack of Transportation (Non-Medical): Not on file  Physical Activity:   . Days of Exercise per Week: Not on file  . Minutes of Exercise per Session: Not on file  Stress:   . Feeling of Stress : Not on file  Social Connections:   . Frequency of Communication with Friends and Family: Not on file  . Frequency of Social Gatherings with Friends and Family: Not on file  . Attends Religious Services: Not on file  . Active Member of Clubs or Organizations: Not on file  . Attends Archivist Meetings: Not on file  . Marital Status: Not on file  Intimate Partner Violence:   . Fear of Current or Ex-Partner: Not on file  . Emotionally Abused: Not on file  . Physically Abused: Not on file  . Sexually Abused: Not on file    Outpatient Medications Prior to Visit  Medication Sig Dispense Refill  . Cholecalciferol (VITAMIN D) 2000 UNITS CAPS Take by mouth 2 (two) times daily.      [name] gabapentin (NEURONTIN) 300 MG capsule TAKE (1) CAPSULE THREE TIMES DAILY. 90 capsule 0  . GAMMA AMINOBUTYRIC ACID PO Take 200 mg by mouth as needed. Up to bid    . hydrocortisone (PROCTOZONE-HC) 2.5 % rectal cream Place 1 application rectally 2 (two) times daily. 30 g 0  . Krill Oil 500 MG CAPS     . levothyroxine (SYNTHROID, LEVOTHROID) 50 MCG tablet Take 1 tablet (50 mcg total) by mouth daily. 90 tablet 1  . MAGNESIUM GLYCINATE PLUS PO Take 100 mg by mouth 2 (two) times daily.    [name] nystatin ointment (MYCOSTATIN)     . thyroid (ARMOUR THYROID) 15 MG tablet Take by mouth. 7.5 mg qd    . escitalopram (LEXAPRO) 5 MG tablet Take 1 tablet (5 mg total) by mouth daily. NEEDS OV FOR FURTHER REFILLS 90 tablet 0  . LORazepam (ATIVAN) 2 MG tablet Take 1 tablet (2 mg total) by mouth every 8 (eight) hours as needed for anxiety. 40 tablet 0   No facility-administered medications prior to visit.    Allergies  Allergen Reactions  . Hydrocodone-Acetaminophen Nausea And Vomiting  . Hydrocodone-Acetaminophen Nausea And Vomiting  . Gluten Meal Other (See Comments)    GI symptoms; has BM everytime she urinates GI symptoms; has BM everytime she urinates  . Oxycodone Other (See Comments)    Review of Systems  Constitutional: Negative for fever and malaise/fatigue.  HENT: Negative for congestion.   Eyes: Negative for blurred vision.  Respiratory: Negative for shortness of breath.   Cardiovascular: Negative for chest pain, palpitations and leg swelling.  Gastrointestinal: Negative for abdominal pain, blood in stool and nausea.  Genitourinary: Negative for dysuria and frequency.  Musculoskeletal: Negative for falls.  Skin: Negative for rash.  Neurological: Positive for dizziness. Negative for loss of consciousness and headaches.  Endo/Heme/Allergies: Negative for environmental allergies.  Psychiatric/Behavioral: Negative for depression. The patient is not nervous/anxious.        Objective:      Physical Exam Constitutional:      General: She is not in acute distress.    Appearance: She is well-developed.  HENT:     Head: Normocephalic and atraumatic.  Eyes:     Conjunctiva/sclera: Conjunctivae normal.  Neck:     Thyroid: No thyromegaly.     Vascular: Carotid bruit present.     Comments: Bilateral bruit Cardiovascular:     Rate and Rhythm: Normal rate and regular rhythm.  Heart sounds: Normal heart sounds. No murmur.  Pulmonary:     Effort: Pulmonary effort is normal. No respiratory distress.     Breath sounds: Normal breath sounds.  Abdominal:     General: Bowel sounds are normal. There is no distension.     Palpations: Abdomen is soft. There is no mass.     Tenderness: There is no abdominal tenderness.  Genitourinary:    General: Normal vulva.     Vagina: No vaginal discharge.     Rectum: Normal.     Comments: Cervix without lesions, no vaginal discharge.  Musculoskeletal:     Cervical back: Neck supple.  Lymphadenopathy:     Cervical: No cervical adenopathy.  Skin:    General: Skin is warm and dry.  Neurological:     Mental Status: She is alert and oriented to person, place, and time.  Psychiatric:        Behavior: Behavior normal.     LMP 03/29/2004  Wt Readings from Last 3 Encounters:  09/10/18 170 lb (77.1 kg)  06/08/18 177 lb (80.3 kg)  03/12/18 177 lb 6.4 oz (80.5 kg)    Diabetic Foot Exam - Simple   No data filed     Lab Results  Component Value Date   WBC 6.3 04/08/2019   HGB 14.0 04/08/2019   HCT 41.6 04/08/2019   PLT 253.0 04/08/2019   GLUCOSE 89 04/08/2019   CHOL 227 (H) 04/08/2019   TRIG 81.0 04/08/2019   HDL 60.50 04/08/2019   LDLCALC 151 (H) 04/08/2019   ALT 19 04/08/2019   AST 27 04/08/2019   NA 139 04/08/2019   K 4.6 04/08/2019   CL 103 04/08/2019   CREATININE 0.93 04/08/2019   BUN 18 04/08/2019   CO2 29 04/08/2019   TSH 3.16 03/30/2018   HGBA1C 5.3 04/08/2019   MICROALBUR 0.2 02/28/2014    Lab Results   Component Value Date   TSH 3.16 03/30/2018   Lab Results  Component Value Date   WBC 6.3 04/08/2019   HGB 14.0 04/08/2019   HCT 41.6 04/08/2019   MCV 97.7 04/08/2019   PLT 253.0 04/08/2019   Lab Results  Component Value Date   NA 139 04/08/2019   K 4.6 04/08/2019   CO2 29 04/08/2019   GLUCOSE 89 04/08/2019   BUN 18 04/08/2019   CREATININE 0.93 04/08/2019   BILITOT 1.1 04/08/2019   ALKPHOS 58 04/08/2019   AST 27 04/08/2019   ALT 19 04/08/2019   PROT 7.3 04/08/2019   ALBUMIN 4.6 04/08/2019   CALCIUM 9.8 04/08/2019   GFR 59.85 (L) 04/08/2019   Lab Results  Component Value Date   CHOL 227 (H) 04/08/2019   Lab Results  Component Value Date   HDL 60.50 04/08/2019   Lab Results  Component Value Date   LDLCALC 151 (H) 04/08/2019   Lab Results  Component Value Date   TRIG 81.0 04/08/2019   Lab Results  Component Value Date   CHOLHDL 4 04/08/2019   Lab Results  Component Value Date   HGBA1C 5.3 04/08/2019       Assessment & Plan:   Problem List Items Addressed This Visit    Hypothyroidism    Tolerating medications, continue to monitor      Relevant Orders   CBC (Completed)   Thyroid peroxidase antibody (Completed)   Vitamin D deficiency    Supplement and monitor      Hyperlipidemia, mixed    Encouraged heart healthy diet, increase exercise, avoid  trans fats, consider a krill oil cap daily      Relevant Orders   CBC (Completed)   Lipid panel (Completed)   Preventative health care    Patient encouraged to maintain heart healthy diet, regular exercise, adequate sleep. Consider daily probiotics. Take medications as prescribed. Labs ordered and reviewed      Hyperglycemia    hgba1c acceptable, minimize simple carbs. Increase exercise as tolerated.       Relevant Orders   CBC (Completed)   CMP (Completed)   A1C (Completed)   Cervical cancer screening - Primary    Pap today, no concerns on exam.       Relevant Orders   Cytology - PAP( [facility]) (Completed)   Vertigo    No symptoms today but has had a couple of episodes over the past few months. Hydrate well. Make sure to eat protein every 4-5 hours. Report if worsens. Can use Meclizine prn      Relevant Orders   US Carotid Bilateral ([facility]) (Completed)   Bilateral carotid bruits    Check bilateral carotid artery dopplers.          I have discontinued Lakyla H. Soutar's escitalopram. I am also having her start on escitalopram. Additionally, I am having her maintain her Vitamin D, GAMMA AMINOBUTYRIC ACID PO, thyroid, MAGNESIUM GLYCINATE PLUS PO, hydrocortisone, Krill Oil, levothyroxine, gabapentin, nystatin ointment, and LORazepam.  Meds ordered this encounter  Medications  . escitalopram (LEXAPRO) 10 MG tablet    Sig: Take 1 tablet (10 mg total) by mouth daily.    Dispense:  30 tablet    Refill:  5  . LORazepam (ATIVAN) 2 MG tablet    Sig: Take 1 tablet (2 mg total) by mouth every 8 (eight) hours as needed for anxiety.    Dispense:  40 tablet    Refill:  0     Penni Homans, MD

## 2019-04-12 NOTE — Assessment & Plan Note (Signed)
No symptoms today but has had a couple of episodes over the past few months. Hydrate well. Make sure to eat protein every 4-5 hours. Report if worsens. Can use Meclizine prn

## 2019-04-12 NOTE — Assessment & Plan Note (Signed)
Check bilateral carotid artery dopplers.

## 2019-04-12 NOTE — Assessment & Plan Note (Signed)
Encouraged heart healthy diet, increase exercise, avoid trans fats, consider a krill oil cap daily 

## 2019-04-12 NOTE — Assessment & Plan Note (Addendum)
Tolerating medications, continue to monitor

## 2019-04-12 NOTE — Assessment & Plan Note (Signed)
Patient encouraged to maintain heart healthy diet, regular exercise, adequate sleep. Consider daily probiotics. Take medications as prescribed. Labs ordered and reviewed 

## 2019-04-12 NOTE — Assessment & Plan Note (Signed)
hgba1c acceptable, minimize simple carbs. Increase exercise as tolerated.  

## 2019-04-21 ENCOUNTER — Other Ambulatory Visit: Payer: Medicare Other

## 2019-05-03 DIAGNOSIS — M5416 Radiculopathy, lumbar region: Secondary | ICD-10-CM | POA: Diagnosis not present

## 2019-05-06 ENCOUNTER — Ambulatory Visit: Payer: Medicare Other | Attending: Internal Medicine

## 2019-05-06 DIAGNOSIS — Z20822 Contact with and (suspected) exposure to covid-19: Secondary | ICD-10-CM

## 2019-05-08 LAB — NOVEL CORONAVIRUS, NAA: SARS-CoV-2, NAA: NOT DETECTED

## 2019-05-13 ENCOUNTER — Telehealth: Payer: Self-pay | Admitting: Plastic Surgery

## 2019-05-13 NOTE — Telephone Encounter (Signed)

## 2019-05-14 ENCOUNTER — Encounter: Payer: Self-pay | Admitting: Plastic Surgery

## 2019-05-14 ENCOUNTER — Ambulatory Visit (INDEPENDENT_AMBULATORY_CARE_PROVIDER_SITE_OTHER): Payer: Medicare Other | Admitting: Plastic Surgery

## 2019-05-14 ENCOUNTER — Other Ambulatory Visit: Payer: Self-pay

## 2019-05-14 DIAGNOSIS — Z85820 Personal history of malignant melanoma of skin: Secondary | ICD-10-CM | POA: Diagnosis not present

## 2019-05-14 DIAGNOSIS — L989 Disorder of the skin and subcutaneous tissue, unspecified: Secondary | ICD-10-CM | POA: Insufficient documentation

## 2019-05-14 NOTE — Progress Notes (Signed)
Patient ID: Shannon [name], female    DOB: 04/20/1951, 69 y.o.   MRN: [id number]   Chief Complaint  Patient presents with  . Skin Problem    The patient is a 69 year old female here for evaluation of her face.  She has a history of melanoma that was on her lower lip.  She has noticed a lesion of her left nasolabial fold.  It seems to be getting larger and irritated.  The area has increased in size in the last several months.  It is flesh-colored raised and 6 mm in size.  She is otherwise in stable and healthy condition.  She has not had any recent illnesses.  She sees a dermatologist, Dr. Girtha Rm, on a regular basis.  Nothing seems to make the area better and she is concerned due to the increase in size.   Review of Systems  Constitutional: Negative.   HENT: Negative.   Eyes: Negative.   Respiratory: Negative.   Cardiovascular: Negative.   Gastrointestinal: Negative.   Endocrine: Negative.   Genitourinary: Negative.   Musculoskeletal: Negative.   Skin: Negative.   Psychiatric/Behavioral: Negative.     Past Medical History:  Diagnosis Date  . ADD (attention deficit disorder with hyperactivity)   . Anxiety   . Asthma   . Cancer ([place])    melanoma- lip 2011  . Depression   . Diverticulosis   . Hyperlipidemia, mixed 03/11/2016  . Hypothyroidism   . Menopause   . Osteopenia   . Rosacea   . Sciatica of left side 02/24/2017  . Travel advice encounter 03/25/2016    Past Surgical History:  Procedure Laterality Date  . blephoroplasty    . BROW LIFT    . DILATION AND CURETTAGE OF UTERUS    . SKIN CANCER EXCISION     right lower lip  . TONSILLECTOMY AND ADENOIDECTOMY        Current Outpatient Medications:  .  Cholecalciferol (VITAMIN D) 2000 UNITS CAPS, Take by mouth 2 (two) times daily. , Disp: , Rfl:  .  escitalopram (LEXAPRO) 10 MG tablet, Take 1 tablet (10 mg total) by mouth daily., Disp: 30 tablet, Rfl: 5 .  gabapentin (NEURONTIN) 300 MG capsule, TAKE (1) CAPSULE THREE  TIMES DAILY., Disp: 90 capsule, Rfl: 0 .  GAMMA AMINOBUTYRIC ACID PO, Take 200 mg by mouth as needed. Up to bid, Disp: , Rfl:  .  hydrocortisone (PROCTOZONE-HC) 2.5 % rectal cream, Place 1 application rectally 2 (two) times daily., Disp: 30 g, Rfl: 0 .  Krill Oil 500 MG CAPS, , Disp: , Rfl:  .  LORazepam (ATIVAN) 2 MG tablet, Take 1 tablet (2 mg total) by mouth every 8 (eight) hours as needed for anxiety., Disp: 40 tablet, Rfl: 0 .  MAGNESIUM GLYCINATE PLUS PO, Take 100 mg by mouth 2 (two) times daily., Disp: , Rfl:  .  nystatin ointment (MYCOSTATIN), , Disp: , Rfl:  .  thyroid (ARMOUR THYROID) 15 MG tablet, Take by mouth. 7.5 mg qd, Disp: , Rfl:  .  TIROSINT 88 MCG CAPS, TAKE ONE CAPSULE BY MOUTH IN THE MORNING FIVE DAYS PER WEEK AND THEN ON MONDAY AND friday take ONE AND ONE-HALF capsule, Disp: , Rfl:    Objective:   Vitals:   05/14/19 1147  BP: 131/73  Pulse: 69  Temp: (!) 96.9 F (36.1 C)  SpO2: 97%    Physical Exam Vitals and nursing note reviewed.  Constitutional:      Appearance: Normal appearance.  HENT:     Head: Normocephalic and atraumatic.   Cardiovascular:     Rate and Rhythm: Normal rate.  Pulmonary:     Effort: Pulmonary effort is normal.  Abdominal:     General: Abdomen is flat. There is no distension.  Neurological:     General: No focal deficit present.     Mental Status: She is alert and oriented to person, place, and time.  Psychiatric:        Mood and Affect: Mood normal.        Thought Content: Thought content normal.     Assessment & Plan:  Changing skin lesion  History of melanoma  Excision of left nasolabial fold changing skin lesion.  We discussed there will be a scar and I gave her an example of what it would look like.  She would like to proceed with excision. Pictures were obtained of the patient and placed in the chart with the patient's or guardian's permission.   Wallace Going, DO   The 21st Century Cures Act was signed into  law in 2016 which includes the topic of electronic health records.  This provides immediate access to information in MyChart.  This includes consultation notes, operative notes, office notes, lab results and pathology reports.  If you have any questions about what you read please let us know at your next visit or call us at the office.  We are right here with you.

## 2019-05-17 DIAGNOSIS — Z03818 Encounter for observation for suspected exposure to other biological agents ruled out: Secondary | ICD-10-CM | POA: Diagnosis not present

## 2019-05-18 DIAGNOSIS — M542 Cervicalgia: Secondary | ICD-10-CM | POA: Diagnosis not present

## 2019-05-19 ENCOUNTER — Ambulatory Visit: Payer: Medicare Other | Attending: Internal Medicine

## 2019-05-19 DIAGNOSIS — Z23 Encounter for immunization: Secondary | ICD-10-CM | POA: Diagnosis present

## 2019-05-19 NOTE — Progress Notes (Signed)
   Covid-19 Vaccination Clinic  Name:  Shannon [name]    MRN: [id number] DOB: 1950-06-24  05/19/2019  [name] was observed post Covid-19 immunization for 15 minutes without incidence. She was provided with Vaccine Information Sheet and instruction to access the V-Safe system.   [name] was instructed to call 911 with any severe reactions post vaccine: [name] Difficulty breathing  . Swelling of your face and throat  . A fast heartbeat  . A bad rash all over your body  . Dizziness and weakness    Immunizations Administered    Name Date Dose VIS Date Route   Pfizer COVID-19 Vaccine 05/19/2019 11:36 AM 0.3 mL 04/09/2019 Intramuscular   Manufacturer: [place]   Lot: [id number]   [place]: [id number]

## 2019-05-25 DIAGNOSIS — H02836 Dermatochalasis of left eye, unspecified eyelid: Secondary | ICD-10-CM | POA: Diagnosis not present

## 2019-05-25 DIAGNOSIS — H43393 Other vitreous opacities, bilateral: Secondary | ICD-10-CM | POA: Diagnosis not present

## 2019-05-25 DIAGNOSIS — H524 Presbyopia: Secondary | ICD-10-CM | POA: Diagnosis not present

## 2019-05-25 DIAGNOSIS — H52203 Unspecified astigmatism, bilateral: Secondary | ICD-10-CM | POA: Diagnosis not present

## 2019-05-25 DIAGNOSIS — H02833 Dermatochalasis of right eye, unspecified eyelid: Secondary | ICD-10-CM | POA: Diagnosis not present

## 2019-05-25 DIAGNOSIS — H25813 Combined forms of age-related cataract, bilateral: Secondary | ICD-10-CM | POA: Diagnosis not present

## 2019-05-25 DIAGNOSIS — H5213 Myopia, bilateral: Secondary | ICD-10-CM | POA: Diagnosis not present

## 2019-05-31 ENCOUNTER — Ambulatory Visit: Payer: Medicare Other

## 2019-06-07 ENCOUNTER — Ambulatory Visit: Payer: Medicare Other | Attending: Internal Medicine

## 2019-06-07 DIAGNOSIS — Z23 Encounter for immunization: Secondary | ICD-10-CM | POA: Insufficient documentation

## 2019-06-07 DIAGNOSIS — M542 Cervicalgia: Secondary | ICD-10-CM | POA: Diagnosis not present

## 2019-06-07 NOTE — Progress Notes (Signed)
   Covid-19 Vaccination Clinic  Name:  Dorlene H Younes    MRN: [id number] DOB: [birth year]  06/07/2019  Ms. Weisensel was observed post Covid-19 immunization for 15 minutes without incidence. She was provided with Vaccine Information Sheet and instruction to access the V-Safe system.   Ms. Coxwell was instructed to call 911 with any severe reactions post vaccine: [name] Difficulty breathing  . Swelling of your face and throat  . A fast heartbeat  . A bad rash all over your body  . Dizziness and weakness    Immunizations Administered    Name Date Dose VIS Date Route   Pfizer COVID-19 Vaccine 06/07/2019  5:54 PM 0.3 mL 04/09/2019 Intramuscular   Manufacturer: [place]   Lot: [id number]   [place]: [id number]

## 2019-06-08 DIAGNOSIS — Z1231 Encounter for screening mammogram for malignant neoplasm of breast: Secondary | ICD-10-CM | POA: Diagnosis not present

## 2019-06-08 LAB — HM MAMMOGRAPHY

## 2019-06-21 DIAGNOSIS — M542 Cervicalgia: Secondary | ICD-10-CM | POA: Diagnosis not present

## 2019-06-22 ENCOUNTER — Ambulatory Visit: Payer: Medicare Other | Admitting: Plastic Surgery

## 2019-07-08 ENCOUNTER — Other Ambulatory Visit: Payer: Self-pay

## 2019-07-08 ENCOUNTER — Ambulatory Visit (INDEPENDENT_AMBULATORY_CARE_PROVIDER_SITE_OTHER): Payer: Medicare Other | Admitting: Family Medicine

## 2019-07-08 DIAGNOSIS — E039 Hypothyroidism, unspecified: Secondary | ICD-10-CM

## 2019-07-08 DIAGNOSIS — F4323 Adjustment disorder with mixed anxiety and depressed mood: Secondary | ICD-10-CM

## 2019-07-08 DIAGNOSIS — R739 Hyperglycemia, unspecified: Secondary | ICD-10-CM | POA: Diagnosis not present

## 2019-07-08 DIAGNOSIS — E559 Vitamin D deficiency, unspecified: Secondary | ICD-10-CM | POA: Diagnosis not present

## 2019-07-11 NOTE — Assessment & Plan Note (Signed)
hgba1c acceptable, minimize simple carbs. Increase exercise as tolerated.  

## 2019-07-11 NOTE — Progress Notes (Signed)
Virtual Visit via Video Note  I connected with [name] on 07/08/19 at  2:40 PM EST by a video enabled telemedicine application and verified that I am speaking with the correct person using two identifiers.  Location: Patient: home  Provider: office   I discussed the limitations of evaluation and management by telemedicine and the availability of in person appointments. The patient expressed understanding and agreed to proceed. Shannon [name], [name] was able to get the patient set up on a video visit   Subjective:    Patient ID: Shannon [name], female    DOB: Mar 28, 1951, 69 y.o.   MRN: [id number]  Chief Complaint  Patient presents with  . Depression    Follow up depression with anxiety     HPI Patient is in today for follow up on chronic medical concerns. She is under a great deal of stress with family concerns and she has decided to go back up on Lexapro to 10 mg daily from 5 mg 5 days a week and 10 the other 2 days. Notes anxiety and anhedonia but does not endorse suicidal ideation. No recent febrile illness or hospitalizations. She has had her Pfizer vaccine. Denies CP/palp/SOB/HA/congestion/fevers/GI or GU c/o. Taking meds as prescribed  Past Medical History:  Diagnosis Date  . ADD (attention deficit disorder with hyperactivity)   . Anxiety   . Asthma   . Cancer ([place])    melanoma- lip 2011  . Depression   . Diverticulosis   . Hyperlipidemia, mixed 03/11/2016  . Hypothyroidism   . Menopause   . Osteopenia   . Rosacea   . Sciatica of left side 02/24/2017  . Travel advice encounter 03/25/2016    Past Surgical History:  Procedure Laterality Date  . blephoroplasty    . BROW LIFT    . DILATION AND CURETTAGE OF UTERUS    . SKIN CANCER EXCISION     right lower lip  . TONSILLECTOMY AND ADENOIDECTOMY      Family History  Problem Relation Age of Onset  . Colon polyps Mother   . Parkinson's disease Mother   . Diabetes Father   . Emphysema Father   . Tuberculosis Father   .  Colon cancer Maternal Grandfather   . Alcohol abuse Sister   . Cancer Sister        skin cancer, melanoma cell, basil cell  . ADD / ADHD Daughter   . Anxiety disorder Daughter   . Depression Daughter   . ADD / ADHD Son   . Anxiety disorder Son   . Depression Son   . Cancer Sister        melanoma  . Anxiety disorder Brother     Social History   Socioeconomic History  . Marital status: Single    Spouse name: Not on file  . Number of children: 2  . Years of education: Not on file  . Highest education level: Not on file  Occupational History  . Occupation: Retired Tourist information centre manager  Tobacco Use  . Smoking status: Never Smoker  . Smokeless tobacco: Never Used  Substance and Sexual Activity  . Alcohol use: Yes    Alcohol/week: 3.0 - 4.0 standard drinks    Types: 1 - 2 Glasses of wine, 1 Cans of beer, 1 Shots of liquor per week    Comment: not every night.  . Drug use: No  . Sexual activity: Not on file  Other Topics Concern  . Not on file  Social History Narrative  Retired from [employer].   No major dietary restrictions except avoid gluten.       Social Determinants of Health   Financial Resource Strain:   . Difficulty of Paying Living Expenses:   Food Insecurity:   . Worried About Charity fundraiser in the Last Year:   . Arboriculturist in the Last Year:   Transportation Needs:   . Film/video editor (Medical):   [name] Lack of Transportation (Non-Medical):   Physical Activity:   . Days of Exercise per Week:   . Minutes of Exercise per Session:   Stress:   . Feeling of Stress :   Social Connections:   . Frequency of Communication with Friends and Family:   . Frequency of Social Gatherings with Friends and Family:   . Attends Religious Services:   . Active Member of Clubs or Organizations:   . Attends Archivist Meetings:   [name] Marital Status:   Intimate Partner Violence:   . Fear of Current or Ex-Partner:   . Emotionally Abused:   [name] Physically  Abused:   . Sexually Abused:     Outpatient Medications Prior to Visit  Medication Sig Dispense Refill  . Cholecalciferol (VITAMIN D) 2000 UNITS CAPS Take by mouth 2 (two) times daily.     [name] escitalopram (LEXAPRO) 10 MG tablet Take 1 tablet (10 mg total) by mouth daily. 30 tablet 5  . gabapentin (NEURONTIN) 300 MG capsule TAKE (1) CAPSULE THREE TIMES DAILY. 90 capsule 0  . GAMMA AMINOBUTYRIC ACID PO Take 200 mg by mouth as needed. Up to bid    . hydrocortisone (PROCTOZONE-HC) 2.5 % rectal cream Place 1 application rectally 2 (two) times daily. 30 g 0  . Krill Oil 500 MG CAPS     . LORazepam (ATIVAN) 2 MG tablet Take 1 tablet (2 mg total) by mouth every 8 (eight) hours as needed for anxiety. 40 tablet 0  . MAGNESIUM GLYCINATE PLUS PO Take 100 mg by mouth 2 (two) times daily.    [name] nystatin ointment (MYCOSTATIN)     . thyroid (ARMOUR THYROID) 15 MG tablet Take by mouth. 7.5 mg qd    . TIROSINT 88 MCG CAPS TAKE ONE CAPSULE BY MOUTH IN THE MORNING FIVE DAYS PER WEEK AND THEN ON MONDAY AND friday take ONE AND ONE-HALF capsule     No facility-administered medications prior to visit.    Allergies  Allergen Reactions  . Hydrocodone-Acetaminophen Nausea And Vomiting  . Hydrocodone-Acetaminophen Nausea And Vomiting  . Gluten Meal Other (See Comments)    GI symptoms; has BM everytime she urinates GI symptoms; has BM everytime she urinates  . Oxycodone Other (See Comments)    Review of Systems  Constitutional: Positive for malaise/fatigue. Negative for fever.  HENT: Negative for congestion.   Eyes: Negative for blurred vision.  Respiratory: Negative for shortness of breath.   Cardiovascular: Negative for chest pain, palpitations and leg swelling.  Gastrointestinal: Negative for abdominal pain, blood in stool and nausea.  Genitourinary: Negative for dysuria and frequency.  Musculoskeletal: Negative for falls.  Skin: Negative for rash.  Neurological: Negative for dizziness, loss of  consciousness and headaches.  Endo/Heme/Allergies: Negative for environmental allergies.  Psychiatric/Behavioral: Positive for depression. The patient is nervous/anxious.        Objective:    Physical Exam Constitutional:      Appearance: Normal appearance. She is not ill-appearing.  HENT:     Head: Normocephalic and atraumatic.  Eyes:  General:        Right eye: No discharge.        Left eye: No discharge.  Pulmonary:     Effort: Pulmonary effort is normal.  Neurological:     Mental Status: She is alert and oriented to person, place, and time.  Psychiatric:        Behavior: Behavior normal.     LMP 03/29/2004  Wt Readings from Last 3 Encounters:  05/14/19 179 lb 9.6 oz (81.5 kg)  09/10/18 170 lb (77.1 kg)  06/08/18 177 lb (80.3 kg)    Diabetic Foot Exam - Simple   No data filed     Lab Results  Component Value Date   WBC 6.3 04/08/2019   HGB 14.0 04/08/2019   HCT 41.6 04/08/2019   PLT 253.0 04/08/2019   GLUCOSE 89 04/08/2019   CHOL 227 (H) 04/08/2019   TRIG 81.0 04/08/2019   HDL 60.50 04/08/2019   LDLCALC 151 (H) 04/08/2019   ALT 19 04/08/2019   AST 27 04/08/2019   NA 139 04/08/2019   K 4.6 04/08/2019   CL 103 04/08/2019   CREATININE 0.93 04/08/2019   BUN 18 04/08/2019   CO2 29 04/08/2019   TSH 3.16 03/30/2018   HGBA1C 5.3 04/08/2019   MICROALBUR 0.2 02/28/2014    Lab Results  Component Value Date   TSH 3.16 03/30/2018   Lab Results  Component Value Date   WBC 6.3 04/08/2019   HGB 14.0 04/08/2019   HCT 41.6 04/08/2019   MCV 97.7 04/08/2019   PLT 253.0 04/08/2019   Lab Results  Component Value Date   NA 139 04/08/2019   K 4.6 04/08/2019   CO2 29 04/08/2019   GLUCOSE 89 04/08/2019   BUN 18 04/08/2019   CREATININE 0.93 04/08/2019   BILITOT 1.1 04/08/2019   ALKPHOS 58 04/08/2019   AST 27 04/08/2019   ALT 19 04/08/2019   PROT 7.3 04/08/2019   ALBUMIN 4.6 04/08/2019   CALCIUM 9.8 04/08/2019   GFR 59.85 (L) 04/08/2019   Lab  Results  Component Value Date   CHOL 227 (H) 04/08/2019   Lab Results  Component Value Date   HDL 60.50 04/08/2019   Lab Results  Component Value Date   LDLCALC 151 (H) 04/08/2019   Lab Results  Component Value Date   TRIG 81.0 04/08/2019   Lab Results  Component Value Date   CHOLHDL 4 04/08/2019   Lab Results  Component Value Date   HGBA1C 5.3 04/08/2019       Assessment & Plan:   Problem List Items Addressed This Visit    Hypothyroidism    On Armour Thyroid      Adjustment disorder with mixed anxiety and depressed mood    Had tried dropping her Lexapro to 10 mg twice a week and 5 mg the other days but then she has had another family stressor so she is going to take 10 mg daily.       Vitamin D deficiency    Supplement and monitor      Hyperglycemia    hgba1c acceptable, minimize simple carbs. Increase exercise as tolerated.         I am having Zivah H. Southard maintain her Vitamin D, GAMMA AMINOBUTYRIC ACID PO, thyroid, MAGNESIUM GLYCINATE PLUS PO, hydrocortisone, Krill Oil, gabapentin, nystatin ointment, escitalopram, LORazepam, and Tirosint.  No orders of the defined types were placed in this encounter.    I discussed the assessment and treatment plan with the  patient. The patient was provided an opportunity to ask questions and all were answered. The patient agreed with the plan and demonstrated an understanding of the instructions.   The patient was advised to call back or seek an in-person evaluation if the symptoms worsen or if the condition fails to improve as anticipated.  I provided 25 minutes of non-face-to-face time during this encounter.   Penni Homans, MD

## 2019-07-11 NOTE — Assessment & Plan Note (Signed)
Supplement and monitor 

## 2019-07-11 NOTE — Assessment & Plan Note (Addendum)
On Armour Thyroid 

## 2019-07-11 NOTE — Assessment & Plan Note (Signed)
Had tried dropping her Lexapro to 10 mg twice a week and 5 mg the other days but then she has had another family stressor so she is going to take 10 mg daily.

## 2019-07-13 ENCOUNTER — Other Ambulatory Visit (HOSPITAL_COMMUNITY)
Admission: RE | Admit: 2019-07-13 | Discharge: 2019-07-13 | Disposition: A | Discharge status: Home / Self Care | Payer: Medicare Other | Source: Ambulatory Visit | Attending: Plastic Surgery | Admitting: Plastic Surgery

## 2019-07-13 ENCOUNTER — Encounter: Payer: Self-pay | Admitting: Plastic Surgery

## 2019-07-13 ENCOUNTER — Ambulatory Visit (INDEPENDENT_AMBULATORY_CARE_PROVIDER_SITE_OTHER): Payer: Medicare Other | Admitting: Plastic Surgery

## 2019-07-13 ENCOUNTER — Other Ambulatory Visit: Payer: Self-pay

## 2019-07-13 VITALS — BP 110/74 | HR 54 | Temp 98.7°F | Ht 65.0 in | Wt 181.8 lb

## 2019-07-13 DIAGNOSIS — D2339 Other benign neoplasm of skin of other parts of face: Secondary | ICD-10-CM | POA: Diagnosis not present

## 2019-07-13 DIAGNOSIS — L989 Disorder of the skin and subcutaneous tissue, unspecified: Secondary | ICD-10-CM

## 2019-07-13 DIAGNOSIS — M542 Cervicalgia: Secondary | ICD-10-CM | POA: Diagnosis not present

## 2019-07-13 NOTE — Progress Notes (Signed)
Procedure Note  Preoperative Dx: Changing skin lesion left cheek  Postoperative Dx: Same  Procedure: Excision changing skin lesion left cheek 6 mm  Anesthesia: Lidocaine 1% with 1:100,000 epinepherine   Description of Procedure: Risks and complications were explained to the patient.  Consent was confirmed and the patient understands the risks and benefits.  The potential complications and alternatives were explained and the patient consents.  The patient expressed understanding the option of not having the procedure and the risks of a scar.  Time out was called and all information was confirmed to be correct.    The area was prepped and drapped.  Lidocaine 1% with epinepherine was injected in the subcutaneous area.  After waiting several minutes for the local to take affect a #15 blade was used to excise the area in an eliptical pattern.  A 5-0 Monocryl was used to close the skin edges.  A dressing was applied.  The patient was given instructions on how to care for the area and a follow up appointment.  Shannon [name] tolerated the procedure well and there were no complications. The specimen was sent to pathology.  The Shannon [name] was signed into law in 2016 which includes the topic of electronic health records.  This provides immediate access to information in MyChart.  This includes consultation notes, operative notes, office notes, lab results and pathology reports.  If you have any questions about what you read please let us know at your next visit or call us at the office.  We are right here with you.

## 2019-07-14 ENCOUNTER — Telehealth: Payer: Self-pay

## 2019-07-14 NOTE — Telephone Encounter (Signed)
Returned patients call, LMVM °

## 2019-07-14 NOTE — Telephone Encounter (Signed)
Patient had some dry blood coming through her steri strip last night and after she worked out today. She does take ASA on daily basis and Ibuprofen when needed. Called patient back and advised her not to stop the ASA to call her PC since they are the ones who told her to take this medication and get their advice. If she has pain for the next couple of days to use tylenol. Also to stop exercising for a couple of days along with keep her head elevated. Patient understood and agreed. (okay'd by Venetia Night)

## 2019-07-14 NOTE — Telephone Encounter (Signed)
Patient called as she has had some bleeding through her steri-strips after working out yesterday. She was wearing a mask while working out and she noticed blood on the mask when she took it off.

## 2019-07-15 LAB — SURGICAL PATHOLOGY

## 2019-07-20 ENCOUNTER — Ambulatory Visit (INDEPENDENT_AMBULATORY_CARE_PROVIDER_SITE_OTHER): Payer: Medicare Other | Admitting: Plastic Surgery

## 2019-07-20 ENCOUNTER — Encounter: Payer: Self-pay | Admitting: Plastic Surgery

## 2019-07-20 ENCOUNTER — Other Ambulatory Visit: Payer: Self-pay

## 2019-07-20 DIAGNOSIS — D3610 Benign neoplasm of peripheral nerves and autonomic nervous system, unspecified: Secondary | ICD-10-CM

## 2019-07-20 HISTORY — DX: Benign neoplasm of peripheral nerves and autonomic nervous system, unspecified: D36.10

## 2019-07-20 NOTE — Progress Notes (Signed)
The patient is a 69 year old female here for follow-up after undergoing a skin lesion excision from her left cheek.  The pathology showed a neurofibroma.  Is healing very nicely.  I removed the sutures today and reinforced the area with a Steri-Strip.  Recommend a Steri-Strip for 1 more week she can then go to using another Steri-Strip for another week or Mederma and massage.  Call with any questions or concerns and we are here if she needs anything further.

## 2019-07-26 ENCOUNTER — Encounter: Payer: Self-pay | Admitting: Family Medicine

## 2019-07-27 ENCOUNTER — Telehealth: Payer: Self-pay

## 2019-07-27 ENCOUNTER — Other Ambulatory Visit: Payer: Self-pay

## 2019-07-27 NOTE — Telephone Encounter (Signed)
Patient called in needing to speak with the nurse or Dr. Charlett Blake about her previous My. Chart message please call the patient back as soon as possible at  [phone]

## 2019-07-29 NOTE — Telephone Encounter (Signed)
Patient notified form was completed and form email to doctor.

## 2019-08-02 ENCOUNTER — Encounter: Payer: Self-pay | Admitting: *Deleted

## 2019-08-02 NOTE — Telephone Encounter (Signed)
Patient stated that they did not get email.  Will need to get Dr. Charlett Blake to refill out form because original form has been sent to scan.  Sent patient an email to let her know what I am waiting on .    Fax number is [phone]

## 2019-08-02 NOTE — Telephone Encounter (Signed)
Patient calling in regards to paper working being faxed to Dr. Patient states that Dr did not receive information. Please advise

## 2019-08-03 ENCOUNTER — Encounter: Payer: Self-pay | Admitting: *Deleted

## 2019-08-03 NOTE — Telephone Encounter (Signed)
Form faxed and conformation received. Mychart sent to patient.

## 2019-08-03 NOTE — Telephone Encounter (Signed)
Rob Scientist, physiological for Dr. Ron Parker called asking about form that ws sent over. He wants to confirm dates and Vitals. He stated it was some written information and something crossed out. Please call Rob back and confirm  [phone]

## 2019-08-05 NOTE — Telephone Encounter (Signed)
Called number several times and it was busy.  Called patient and she will call and talk with them about form.  Advised her that if she can get a new form from them or have them to send me a new form I can fill out and send it right back for her.

## 2019-08-06 ENCOUNTER — Encounter: Payer: Self-pay | Admitting: *Deleted

## 2019-08-09 DIAGNOSIS — M542 Cervicalgia: Secondary | ICD-10-CM | POA: Diagnosis not present

## 2019-08-10 ENCOUNTER — Encounter: Payer: Self-pay | Admitting: *Deleted

## 2019-08-30 DIAGNOSIS — M542 Cervicalgia: Secondary | ICD-10-CM | POA: Diagnosis not present

## 2019-09-02 ENCOUNTER — Other Ambulatory Visit: Payer: Self-pay

## 2019-09-02 ENCOUNTER — Telehealth: Payer: Self-pay | Admitting: *Deleted

## 2019-09-02 ENCOUNTER — Ambulatory Visit (INDEPENDENT_AMBULATORY_CARE_PROVIDER_SITE_OTHER): Payer: Medicare Other | Admitting: Family Medicine

## 2019-09-02 DIAGNOSIS — R739 Hyperglycemia, unspecified: Secondary | ICD-10-CM | POA: Diagnosis not present

## 2019-09-02 DIAGNOSIS — E559 Vitamin D deficiency, unspecified: Secondary | ICD-10-CM | POA: Diagnosis not present

## 2019-09-02 DIAGNOSIS — E782 Mixed hyperlipidemia: Secondary | ICD-10-CM | POA: Diagnosis not present

## 2019-09-02 DIAGNOSIS — E039 Hypothyroidism, unspecified: Secondary | ICD-10-CM | POA: Diagnosis not present

## 2019-09-02 LAB — COMPREHENSIVE METABOLIC PANEL
ALT: 16 U/L (ref 0–35)
AST: 27 U/L (ref 0–37)
Albumin: 4.3 g/dL (ref 3.5–5.2)
Alkaline Phosphatase: 59 U/L (ref 39–117)
BUN: 18 mg/dL (ref 6–23)
CO2: 30 mEq/L (ref 19–32)
Calcium: 9.5 mg/dL (ref 8.4–10.5)
Chloride: 106 mEq/L (ref 96–112)
Creatinine, Ser: 0.94 mg/dL (ref 0.40–1.20)
GFR: 59.05 mL/min — ABNORMAL LOW (ref >60.00)
Glucose, Bld: 88 mg/dL (ref 70–99)
Potassium: 4.8 mEq/L (ref 3.5–5.1)
Sodium: 139 mEq/L (ref 135–145)
Total Bilirubin: 0.9 mg/dL (ref 0.2–1.2)
Total Protein: 6.5 g/dL (ref 6.0–8.3)

## 2019-09-02 LAB — LIPID PANEL
Cholesterol: 184 mg/dL (ref 0–200)
HDL: 59.9 mg/dL (ref >39.00)
LDL Cholesterol: 112 mg/dL — ABNORMAL HIGH (ref 0–99)
NonHDL: 124.15
Total CHOL/HDL Ratio: 3
Triglycerides: 61 mg/dL (ref 0.0–149.0)
VLDL: 12.2 mg/dL (ref 0.0–40.0)

## 2019-09-02 LAB — HEMOGLOBIN A1C: Hgb A1c MFr Bld: 5.2 % (ref 4.6–6.5)

## 2019-09-02 LAB — TSH: TSH: 0.35 u[IU]/mL (ref 0.35–4.50)

## 2019-09-02 LAB — VITAMIN D 25 HYDROXY (VIT D DEFICIENCY, FRACTURES): VITD: 109.92 ng/mL (ref 30.00–100.00)

## 2019-09-02 NOTE — Assessment & Plan Note (Signed)
hgba1c acceptable, minimize simple carbs. Increase exercise as tolerated.  

## 2019-09-02 NOTE — Patient Instructions (Signed)
High Cholesterol  High cholesterol is a condition in which the blood has high levels of a white, waxy, fat-like substance (cholesterol). The human body needs small amounts of cholesterol. The liver makes all the cholesterol that the body needs. Extra (excess) cholesterol comes from the food that we eat. Cholesterol is carried from the liver by the blood through the blood vessels. If you have high cholesterol, deposits (plaques) Tahesha build up on the walls of your blood vessels (arteries). Plaques make the arteries narrower and stiffer. Cholesterol plaques increase your risk for heart attack and stroke. Work with your health care provider to keep your cholesterol levels in a healthy range. What increases the risk? This condition is more likely to develop in people who:  Eat foods that are high in animal fat (saturated fat) or cholesterol.  Are overweight.  Are not getting enough exercise.  Have a family history of high cholesterol. What are the signs or symptoms? There are no symptoms of this condition. How is this diagnosed? This condition Nanie be diagnosed from the results of a blood test.  If you are older than age 20, your health care provider Joslynn check your cholesterol every 4-6 years.  You Kayleana be checked more often if you already have high cholesterol or other risk factors for heart disease. The blood test for cholesterol measures:  "Bad" cholesterol (LDL cholesterol). This is the main type of cholesterol that causes heart disease. The desired level for LDL is less than 100.  "Good" cholesterol (HDL cholesterol). This type helps to protect against heart disease by cleaning the arteries and carrying the LDL away. The desired level for HDL is 60 or higher.  Triglycerides. These are fats that the body can store or burn for energy. The desired number for triglycerides is lower than 150.  Total cholesterol. This is a measure of the total amount of cholesterol in your blood, including LDL  cholesterol, HDL cholesterol, and triglycerides. A healthy number is less than 200. How is this treated? This condition is treated with diet changes, lifestyle changes, and medicines. Diet changes  This Tapanga include eating more whole grains, fruits, vegetables, nuts, and fish.  This Tierney also include cutting back on red meat and foods that have a lot of added sugar. Lifestyle changes  Changes Ailana include getting at least 40 minutes of aerobic exercise 3 times a week. Aerobic exercises include walking, biking, and swimming. Aerobic exercise along with a healthy diet can help you maintain a healthy weight.  Changes Mckaylee also include quitting smoking. Medicines  Medicines are usually given if diet and lifestyle changes have failed to reduce your cholesterol to healthy levels.  Your health care provider Lular prescribe a statin medicine. Statin medicines have been shown to reduce cholesterol, which can reduce the risk of heart disease. Follow these instructions at home: Eating and drinking If told by your health care provider:  Eat chicken (without skin), fish, veal, shellfish, ground turkey breast, and round or loin cuts of red meat.  Do not eat fried foods or fatty meats, such as hot dogs and salami.  Eat plenty of fruits, such as apples.  Eat plenty of vegetables, such as broccoli, potatoes, and carrots.  Eat beans, peas, and lentils.  Eat grains such as barley, rice, couscous, and bulgur wheat.  Eat pasta without cream sauces.  Use skim or nonfat milk, and eat low-fat or nonfat yogurt and cheeses.  Do not eat or drink whole milk, cream, ice cream, egg yolks,   or hard cheeses.  Do not eat stick margarine or tub margarines that contain trans fats (also called partially hydrogenated oils).  Do not eat saturated tropical oils, such as coconut oil and palm oil.  Do not eat cakes, cookies, crackers, or other baked goods that contain trans fats.  General instructions  Exercise as  directed by your health care provider. Increase your activity level with activities such as gardening, walking, and taking the stairs.  Take over-the-counter and prescription medicines only as told by your health care provider.  Do not use any products that contain nicotine or tobacco, such as cigarettes and e-cigarettes. If you need help quitting, ask your health care provider.  Keep all follow-up visits as told by your health care provider. This is important. Contact a health care provider if:  You are struggling to maintain a healthy diet or weight.  You need help to start on an exercise program.  You need help to stop smoking. Get help right away if:  You have chest pain.  You have trouble breathing. This information is not intended to replace advice given to you by your health care provider. Make sure you discuss any questions you have with your health care provider. Document Revised: 04/18/2017 Document Reviewed: 10/14/2015 Elsevier Patient Education  2020 [employer]  

## 2019-09-02 NOTE — Assessment & Plan Note (Signed)
On Levothyroxine, continue to monitor 

## 2019-09-02 NOTE — Assessment & Plan Note (Signed)
Encouraged heart healthy diet, increase exercise, avoid trans fats, consider a krill oil cap daily 

## 2019-09-02 NOTE — Telephone Encounter (Signed)
CRITICAL VALUE STICKER  CRITICAL VALUE:  Vit D  109.92  RECEIVER (on-site recipient of call): Kelle Darting, [place] NOTIFIED: 09/02/19 @ 4:39pm  MESSENGER (representative from lab): Saa  MD NOTIFIED: Charlett Blake  TIME OF NOTIFICATION: 09/02/19 @ 4:40pm  RESPONSE:

## 2019-09-02 NOTE — Assessment & Plan Note (Addendum)
Supplement and monitor, numbers elevated. Will have her hold all supplementation and repeat labs.

## 2019-09-02 NOTE — Telephone Encounter (Signed)
See result note.  

## 2019-09-03 ENCOUNTER — Telehealth: Payer: Self-pay | Admitting: *Deleted

## 2019-09-03 ENCOUNTER — Other Ambulatory Visit: Payer: Self-pay | Admitting: *Deleted

## 2019-09-03 DIAGNOSIS — E673 Hypervitaminosis D: Secondary | ICD-10-CM

## 2019-09-03 DIAGNOSIS — E559 Vitamin D deficiency, unspecified: Secondary | ICD-10-CM

## 2019-09-03 NOTE — Telephone Encounter (Signed)
Spoke with patient about vitamin d level she stated that just cut back from taking 4,000-5,000 a day (because that's what she takes in the winter) to 2,000 a day.  She will get vit d check in [place].

## 2019-09-05 NOTE — Progress Notes (Signed)
Subjective:    Patient ID: Shannon [name], female    DOB: 1950/09/07, 69 y.o.   MRN: [id number]  Chief Complaint  Patient presents with  . Follow-up    HPI Patient is in today follow up on chronic medical concerns. She feels well. No recent febrile illness or hospitalizations. No episodes of dizziness since last fall. She is trying to stay active and eat a heart healthy diet. Denies CP/palp/SOB/HA/congestion/fevers/GI or GU c/o. Taking meds as prescribed  Past Medical History:  Diagnosis Date  . ADD (attention deficit disorder with hyperactivity)   . Anxiety   . Asthma   . Cancer ([place])    melanoma- lip 2011  . Depression   . Diverticulosis   . Hyperlipidemia, mixed 03/11/2016  . Hypothyroidism   . Menopause   . Osteopenia   . Rosacea   . Sciatica of left side 02/24/2017  . Travel advice encounter 03/25/2016    Past Surgical History:  Procedure Laterality Date  . blephoroplasty    . BROW LIFT    . DILATION AND CURETTAGE OF UTERUS    . SKIN CANCER EXCISION     right lower lip  . TONSILLECTOMY AND ADENOIDECTOMY      Family History  Problem Relation Age of Onset  . Colon polyps Mother   . Parkinson's disease Mother   . Diabetes Father   . Emphysema Father   . Tuberculosis Father   . Colon cancer Maternal Grandfather   . Alcohol abuse Sister   . Cancer Sister        skin cancer, melanoma cell, basil cell  . ADD / ADHD Daughter   . Anxiety disorder Daughter   . Depression Daughter   . ADD / ADHD Son   . Anxiety disorder Son   . Depression Son   . Cancer Sister        melanoma  . Anxiety disorder Brother     Social History   Socioeconomic History  . Marital status: Single    Spouse name: Not on file  . Number of children: 2  . Years of education: Not on file  . Highest education level: Not on file  Occupational History  . Occupation: Retired Tourist information centre manager  Tobacco Use  . Smoking status: Never Smoker  . Smokeless tobacco: Never Used  Substance and Sexual  Activity  . Alcohol use: Yes    Alcohol/week: 3.0 - 4.0 standard drinks    Types: 1 - 2 Glasses of wine, 1 Cans of beer, 1 Shots of liquor per week    Comment: not every night.  . Drug use: No  . Sexual activity: Not on file  Other Topics Concern  . Not on file  Social History Narrative   Retired from [employer].   No major dietary restrictions except avoid gluten.       Social Determinants of Health   Financial Resource Strain:   . Difficulty of Paying Living Expenses:   Food Insecurity:   . Worried About Charity fundraiser in the Last Year:   . Arboriculturist in the Last Year:   Transportation Needs:   . Film/video editor (Medical):   [name] Lack of Transportation (Non-Medical):   Physical Activity:   . Days of Exercise per Week:   . Minutes of Exercise per Session:   Stress:   . Feeling of Stress :   Social Connections:   . Frequency of Communication with Friends and Family:   .  Frequency of Social Gatherings with Friends and Family:   . Attends Religious Services:   . Active Member of Clubs or Organizations:   . Attends Archivist Meetings:   [name] Marital Status:   Intimate Partner Violence:   . Fear of Current or Ex-Partner:   . Emotionally Abused:   [name] Physically Abused:   . Sexually Abused:     Outpatient Medications Prior to Visit  Medication Sig Dispense Refill  . Cholecalciferol (VITAMIN D) 2000 UNITS CAPS Take by mouth 2 (two) times daily.     [name] escitalopram (LEXAPRO) 10 MG tablet Take 1 tablet (10 mg total) by mouth daily. 30 tablet 5  . gabapentin (NEURONTIN) 300 MG capsule TAKE (1) CAPSULE THREE TIMES DAILY. 90 capsule 0  . GAMMA AMINOBUTYRIC ACID PO Take 200 mg by mouth as needed. Up to bid    . hydrocortisone (PROCTOZONE-HC) 2.5 % rectal cream Place 1 application rectally 2 (two) times daily. 30 g 0  . Krill Oil 1000 MG CAPS     . LORazepam (ATIVAN) 2 MG tablet Take 1 tablet (2 mg total) by mouth every 8 (eight) hours as needed for  anxiety. 40 tablet 0  . MAGNESIUM GLYCINATE PLUS PO Take 100 mg by mouth 2 (two) times daily.    [name] nystatin ointment (MYCOSTATIN)     . thyroid (ARMOUR THYROID) 15 MG tablet Take by mouth. 7.5 mg qd    . TIROSINT 88 MCG CAPS 1 tablet daily and 1/2 tablet on Friday     No facility-administered medications prior to visit.    Allergies  Allergen Reactions  . Hydrocodone-Acetaminophen Nausea And Vomiting  . Hydrocodone-Acetaminophen Nausea And Vomiting  . Gluten Meal Other (See Comments)    GI symptoms; has BM everytime she urinates GI symptoms; has BM everytime she urinates  . Oxycodone Other (See Comments)    Review of Systems  Constitutional: Negative for fever and malaise/fatigue.  HENT: Negative for congestion.   Eyes: Negative for blurred vision.  Respiratory: Negative for shortness of breath.   Cardiovascular: Negative for chest pain, palpitations and leg swelling.  Gastrointestinal: Negative for abdominal pain, blood in stool and nausea.  Genitourinary: Negative for dysuria and frequency.  Musculoskeletal: Negative for falls.  Skin: Negative for rash.  Neurological: Negative for dizziness, loss of consciousness and headaches.  Endo/Heme/Allergies: Negative for environmental allergies.  Psychiatric/Behavioral: Negative for depression. The patient is not nervous/anxious.        Objective:    Physical Exam Vitals and nursing note reviewed.  Constitutional:      General: She is not in acute distress.    Appearance: She is well-developed.  HENT:     Head: Normocephalic and atraumatic.     Nose: Nose normal.  Eyes:     General:        Right eye: No discharge.        Left eye: No discharge.  Cardiovascular:     Rate and Rhythm: Normal rate and regular rhythm.     Heart sounds: No murmur.  Pulmonary:     Effort: Pulmonary effort is normal.     Breath sounds: Normal breath sounds.  Abdominal:     General: Bowel sounds are normal.     Palpations: Abdomen is soft.      Tenderness: There is no abdominal tenderness.  Musculoskeletal:     Cervical back: Normal range of motion and neck supple.  Skin:    General: Skin is warm and dry.  Neurological:     Mental Status: She is alert and oriented to person, place, and time.     BP 110/60 (BP Location: Right Arm, Cuff Size: Large)   Pulse (!) 51   Temp 98.4 F (36.9 C) (Temporal)   Resp 12   Ht 5\' 5"  (1.651 m)   Wt 178 lb (80.7 kg)   LMP 03/29/2004   SpO2 96%   BMI 29.62 kg/m  Wt Readings from Last 3 Encounters:  09/02/19 178 lb (80.7 kg)  07/20/19 176 lb (79.8 kg)  07/13/19 181 lb 12.8 oz (82.5 kg)    Diabetic Foot Exam - Simple   No data filed     Lab Results  Component Value Date   WBC 6.3 04/08/2019   HGB 14.0 04/08/2019   HCT 41.6 04/08/2019   PLT 253.0 04/08/2019   GLUCOSE 88 09/02/2019   CHOL 184 09/02/2019   TRIG 61.0 09/02/2019   HDL 59.90 09/02/2019   LDLCALC 112 (H) 09/02/2019   ALT 16 09/02/2019   AST 27 09/02/2019   NA 139 09/02/2019   K 4.8 09/02/2019   CL 106 09/02/2019   CREATININE 0.94 09/02/2019   BUN 18 09/02/2019   CO2 30 09/02/2019   TSH 0.35 09/02/2019   HGBA1C 5.2 09/02/2019   MICROALBUR 0.2 02/28/2014    Lab Results  Component Value Date   TSH 0.35 09/02/2019   Lab Results  Component Value Date   WBC 6.3 04/08/2019   HGB 14.0 04/08/2019   HCT 41.6 04/08/2019   MCV 97.7 04/08/2019   PLT 253.0 04/08/2019   Lab Results  Component Value Date   NA 139 09/02/2019   K 4.8 09/02/2019   CO2 30 09/02/2019   GLUCOSE 88 09/02/2019   BUN 18 09/02/2019   CREATININE 0.94 09/02/2019   BILITOT 0.9 09/02/2019   ALKPHOS 59 09/02/2019   AST 27 09/02/2019   ALT 16 09/02/2019   PROT 6.5 09/02/2019   ALBUMIN 4.3 09/02/2019   CALCIUM 9.5 09/02/2019   GFR 59.05 (L) 09/02/2019   Lab Results  Component Value Date   CHOL 184 09/02/2019   Lab Results  Component Value Date   HDL 59.90 09/02/2019   Lab Results  Component Value Date   LDLCALC 112 (H)  09/02/2019   Lab Results  Component Value Date   TRIG 61.0 09/02/2019   Lab Results  Component Value Date   CHOLHDL 3 09/02/2019   Lab Results  Component Value Date   HGBA1C 5.2 09/02/2019       Assessment & Plan:   Problem List Items Addressed This Visit    Hypothyroidism    On Levothyroxine, continue to monitor      Relevant Orders   TSH (Completed)   Vitamin D deficiency    Supplement and monitor, numbers elevated. Will have her hold all supplementation and repeat labs.      Relevant Orders   VITAMIN D 25 Hydroxy (Vit-D Deficiency, Fractures) (Completed)   Hyperlipidemia, mixed    Encouraged heart healthy diet, increase exercise, avoid trans fats, consider a krill oil cap daily      Relevant Orders   Lipid panel (Completed)   Hyperglycemia    hgba1c acceptable, minimize simple carbs. Increase exercise as tolerated.      Relevant Orders   Hemoglobin A1c (Completed)   Comprehensive metabolic panel (Completed)      I am having Tanaia H. Buhrman maintain her Vitamin D, GAMMA AMINOBUTYRIC ACID PO, thyroid, MAGNESIUM GLYCINATE PLUS  PO, hydrocortisone, Krill Oil, gabapentin, nystatin ointment, escitalopram, LORazepam, and Tirosint.  No orders of the defined types were placed in this encounter.    Penni Homans, MD

## 2019-09-15 DIAGNOSIS — M542 Cervicalgia: Secondary | ICD-10-CM | POA: Diagnosis not present

## 2019-09-17 ENCOUNTER — Other Ambulatory Visit: Payer: Self-pay | Admitting: Family Medicine

## 2019-10-13 DIAGNOSIS — E559 Vitamin D deficiency, unspecified: Secondary | ICD-10-CM | POA: Diagnosis not present

## 2019-10-13 DIAGNOSIS — E349 Endocrine disorder, unspecified: Secondary | ICD-10-CM | POA: Diagnosis not present

## 2019-10-13 DIAGNOSIS — E039 Hypothyroidism, unspecified: Secondary | ICD-10-CM | POA: Diagnosis not present

## 2019-12-10 DIAGNOSIS — Z20822 Contact with and (suspected) exposure to covid-19: Secondary | ICD-10-CM | POA: Diagnosis not present

## 2019-12-10 DIAGNOSIS — J069 Acute upper respiratory infection, unspecified: Secondary | ICD-10-CM | POA: Diagnosis not present

## 2019-12-10 DIAGNOSIS — R05 Cough: Secondary | ICD-10-CM | POA: Diagnosis not present

## 2019-12-10 DIAGNOSIS — R0981 Nasal congestion: Secondary | ICD-10-CM | POA: Diagnosis not present

## 2020-01-06 ENCOUNTER — Other Ambulatory Visit: Payer: Self-pay | Admitting: Family Medicine

## 2020-01-07 MED ORDER — ESCITALOPRAM OXALATE 10 MG PO TABS: 10.0000 mg | ORAL_TABLET | Freq: Every day | ORAL | 1 refills | Status: DC

## 2020-02-15 DIAGNOSIS — M542 Cervicalgia: Secondary | ICD-10-CM | POA: Diagnosis not present

## 2020-02-15 DIAGNOSIS — Z23 Encounter for immunization: Secondary | ICD-10-CM | POA: Diagnosis not present

## 2020-02-18 ENCOUNTER — Telehealth: Payer: Medicare Other | Admitting: Family Medicine

## 2020-02-25 DIAGNOSIS — M542 Cervicalgia: Secondary | ICD-10-CM | POA: Diagnosis not present

## 2020-03-02 DIAGNOSIS — M542 Cervicalgia: Secondary | ICD-10-CM | POA: Diagnosis not present

## 2020-03-17 DIAGNOSIS — M542 Cervicalgia: Secondary | ICD-10-CM | POA: Diagnosis not present

## 2020-03-28 DIAGNOSIS — E039 Hypothyroidism, unspecified: Secondary | ICD-10-CM | POA: Diagnosis not present

## 2020-03-28 DIAGNOSIS — E559 Vitamin D deficiency, unspecified: Secondary | ICD-10-CM | POA: Diagnosis not present

## 2020-03-28 DIAGNOSIS — E349 Endocrine disorder, unspecified: Secondary | ICD-10-CM | POA: Diagnosis not present

## 2020-03-29 DIAGNOSIS — M542 Cervicalgia: Secondary | ICD-10-CM | POA: Diagnosis not present

## 2020-04-06 DIAGNOSIS — D485 Neoplasm of uncertain behavior of skin: Secondary | ICD-10-CM | POA: Diagnosis not present

## 2020-04-06 DIAGNOSIS — Z86018 Personal history of other benign neoplasm: Secondary | ICD-10-CM | POA: Diagnosis not present

## 2020-04-06 DIAGNOSIS — D2261 Melanocytic nevi of right upper limb, including shoulder: Secondary | ICD-10-CM | POA: Diagnosis not present

## 2020-04-06 DIAGNOSIS — C44319 Basal cell carcinoma of skin of other parts of face: Secondary | ICD-10-CM | POA: Diagnosis not present

## 2020-04-06 DIAGNOSIS — L309 Dermatitis, unspecified: Secondary | ICD-10-CM | POA: Diagnosis not present

## 2020-04-06 DIAGNOSIS — D2272 Melanocytic nevi of left lower limb, including hip: Secondary | ICD-10-CM | POA: Diagnosis not present

## 2020-04-06 DIAGNOSIS — L578 Other skin changes due to chronic exposure to nonionizing radiation: Secondary | ICD-10-CM | POA: Diagnosis not present

## 2020-04-06 DIAGNOSIS — D2271 Melanocytic nevi of right lower limb, including hip: Secondary | ICD-10-CM | POA: Diagnosis not present

## 2020-04-06 DIAGNOSIS — L570 Actinic keratosis: Secondary | ICD-10-CM | POA: Diagnosis not present

## 2020-04-06 DIAGNOSIS — D2371 Other benign neoplasm of skin of right lower limb, including hip: Secondary | ICD-10-CM | POA: Diagnosis not present

## 2020-04-06 DIAGNOSIS — Z86008 Personal history of in-situ neoplasm of other site: Secondary | ICD-10-CM | POA: Diagnosis not present

## 2020-04-06 DIAGNOSIS — D225 Melanocytic nevi of trunk: Secondary | ICD-10-CM | POA: Diagnosis not present

## 2020-04-06 DIAGNOSIS — D2361 Other benign neoplasm of skin of right upper limb, including shoulder: Secondary | ICD-10-CM | POA: Diagnosis not present

## 2020-04-11 ENCOUNTER — Other Ambulatory Visit: Payer: Self-pay

## 2020-04-11 ENCOUNTER — Encounter: Payer: Self-pay | Admitting: Family Medicine

## 2020-04-11 ENCOUNTER — Ambulatory Visit (HOSPITAL_BASED_OUTPATIENT_CLINIC_OR_DEPARTMENT_OTHER)
Admission: RE | Admit: 2020-04-11 | Discharge: 2020-04-11 | Disposition: A | Discharge status: Home / Self Care | Payer: Medicare Other | Source: Ambulatory Visit | Attending: Family Medicine | Admitting: Family Medicine

## 2020-04-11 ENCOUNTER — Ambulatory Visit (INDEPENDENT_AMBULATORY_CARE_PROVIDER_SITE_OTHER): Payer: Medicare Other | Admitting: Family Medicine

## 2020-04-11 VITALS — BP 102/64 | HR 68 | Temp 97.5°F | Resp 16 | Ht 65.5 in | Wt 176.0 lb

## 2020-04-11 DIAGNOSIS — Z23 Encounter for immunization: Secondary | ICD-10-CM

## 2020-04-11 DIAGNOSIS — J9811 Atelectasis: Secondary | ICD-10-CM | POA: Diagnosis not present

## 2020-04-11 DIAGNOSIS — R059 Cough, unspecified: Secondary | ICD-10-CM

## 2020-04-11 DIAGNOSIS — E039 Hypothyroidism, unspecified: Secondary | ICD-10-CM | POA: Diagnosis not present

## 2020-04-11 DIAGNOSIS — R739 Hyperglycemia, unspecified: Secondary | ICD-10-CM | POA: Diagnosis not present

## 2020-04-11 DIAGNOSIS — E673 Hypervitaminosis D: Secondary | ICD-10-CM | POA: Diagnosis not present

## 2020-04-11 DIAGNOSIS — M8580 Other specified disorders of bone density and structure, unspecified site: Secondary | ICD-10-CM

## 2020-04-11 DIAGNOSIS — R252 Cramp and spasm: Secondary | ICD-10-CM | POA: Diagnosis not present

## 2020-04-11 DIAGNOSIS — E782 Mixed hyperlipidemia: Secondary | ICD-10-CM | POA: Diagnosis not present

## 2020-04-11 DIAGNOSIS — E559 Vitamin D deficiency, unspecified: Secondary | ICD-10-CM

## 2020-04-11 DIAGNOSIS — Z0000 Encounter for general adult medical examination without abnormal findings: Secondary | ICD-10-CM

## 2020-04-11 LAB — CBC WITH DIFFERENTIAL/PLATELET
Basophils Absolute: 0.1 10*3/uL (ref 0.0–0.1)
Basophils Relative: 1.4 % (ref 0.0–3.0)
Eosinophils Absolute: 0.2 10*3/uL (ref 0.0–0.7)
Eosinophils Relative: 3.9 % (ref 0.0–5.0)
HCT: 42 % (ref 36.0–46.0)
Hemoglobin: 14.2 g/dL (ref 12.0–15.0)
Lymphocytes Relative: 35 % (ref 12.0–46.0)
Lymphs Abs: 1.4 10*3/uL (ref 0.7–4.0)
MCHC: 33.7 g/dL (ref 30.0–36.0)
MCV: 97.2 fl (ref 78.0–100.0)
Monocytes Absolute: 0.4 10*3/uL (ref 0.1–1.0)
Monocytes Relative: 9.8 % (ref 3.0–12.0)
Neutro Abs: 2 10*3/uL (ref 1.4–7.7)
Neutrophils Relative %: 49.9 % (ref 43.0–77.0)
Platelets: 251 10*3/uL (ref 150.0–400.0)
RBC: 4.33 Mil/uL (ref 3.87–5.11)
RDW: 12 % (ref 11.5–15.5)
WBC: 3.9 10*3/uL — ABNORMAL LOW (ref 4.0–10.5)

## 2020-04-11 LAB — COMPREHENSIVE METABOLIC PANEL
ALT: 20 U/L (ref 0–35)
AST: 25 U/L (ref 0–37)
Albumin: 4.5 g/dL (ref 3.5–5.2)
Alkaline Phosphatase: 64 U/L (ref 39–117)
BUN: 16 mg/dL (ref 6–23)
CO2: 30 mEq/L (ref 19–32)
Calcium: 9.5 mg/dL (ref 8.4–10.5)
Chloride: 102 mEq/L (ref 96–112)
Creatinine, Ser: 0.86 mg/dL (ref 0.40–1.20)
GFR: 68.85 mL/min (ref >60.00)
Glucose, Bld: 80 mg/dL (ref 70–99)
Potassium: 4.7 mEq/L (ref 3.5–5.1)
Sodium: 139 mEq/L (ref 135–145)
Total Bilirubin: 1.4 mg/dL — ABNORMAL HIGH (ref 0.2–1.2)
Total Protein: 7.1 g/dL (ref 6.0–8.3)

## 2020-04-11 LAB — LIPID PANEL
Cholesterol: 206 mg/dL — ABNORMAL HIGH (ref 0–200)
HDL: 61.7 mg/dL (ref >39.00)
LDL Cholesterol: 128 mg/dL — ABNORMAL HIGH (ref 0–99)
NonHDL: 144.04
Total CHOL/HDL Ratio: 3
Triglycerides: 81 mg/dL (ref 0.0–149.0)
VLDL: 16.2 mg/dL (ref 0.0–40.0)

## 2020-04-11 LAB — MAGNESIUM: Magnesium: 2.2 mg/dL (ref 1.5–2.5)

## 2020-04-11 NOTE — Patient Instructions (Addendum)
Pepcid/Famotidine 20-40 mg at night and Zyrtec/Cetirizine 10 mg at am.   Hydrate 60-80 0unces of water a day (clear liquids no caffeine)     Preventive Care 69 Years and Older, Female Preventive care refers to lifestyle choices and visits with your health care provider that can promote health and wellness. This includes:  A yearly physical exam. This is also called an annual well check.  Regular dental and eye exams.  Immunizations.  Screening for certain conditions.  Healthy lifestyle choices, such as diet and exercise. What can I expect for my preventive care visit? Physical exam Your health care provider will check:  Height and weight. These Joanne be used to calculate body mass index (BMI), which is a measurement that tells if you are at a healthy weight.  Heart rate and blood pressure.  Your skin for abnormal spots. Counseling Your health care provider Panzy ask you questions about:  Alcohol, tobacco, and drug use.  Emotional well-being.  Home and relationship well-being.  Sexual activity.  Eating habits.  History of falls.  Memory and ability to understand (cognition).  Work and work Statistician.  Pregnancy and menstrual history. What immunizations do I need?  Influenza (flu) vaccine  This is recommended every year. Tetanus, diphtheria, and pertussis (Tdap) vaccine  You Brynnlie need a Td booster every 10 years. Varicella (chickenpox) vaccine  You Ermel need this vaccine if you have not already been vaccinated. Zoster (shingles) vaccine  You Dystany need this after age 69. Pneumococcal conjugate (PCV13) vaccine  One dose is recommended after age 69. Pneumococcal polysaccharide (PPSV23) vaccine  One dose is recommended after age 69. Measles, mumps, and rubella (MMR) vaccine  You Ladaisha need at least one dose of MMR if you were born in 1957 or later. You Akiba also need a second dose. Meningococcal conjugate (MenACWY) vaccine  You Dali need this if you have  certain conditions. Hepatitis A vaccine  You Nayab need this if you have certain conditions or if you travel or work in places where you Ruta be exposed to hepatitis A. Hepatitis B vaccine  You Tiffiney need this if you have certain conditions or if you travel or work in places where you Lakrista be exposed to hepatitis B. Haemophilus influenzae type b (Hib) vaccine  You Yuko need this if you have certain conditions. You Graclynn receive vaccines as individual doses or as more than one vaccine together in one shot (combination vaccines). Talk with your health care provider about the risks and benefits of combination vaccines. What tests do I need? Blood tests  Lipid and cholesterol levels. These Shaniquia be checked every 5 years, or more frequently depending on your overall health.  Hepatitis C test.  Hepatitis B test. Screening  Lung cancer screening. You Aleshia have this screening every year starting at age 69 if you have a 30-pack-year history of smoking and currently smoke or have quit within the past 15 years.  Colorectal cancer screening. All adults should have this screening starting at age 69 and continuing until age 9. Your health care provider Viktoria recommend screening at age 69 if you are at increased risk. You will have tests every 1-10 years, depending on your results and the type of screening test.  Diabetes screening. This is done by checking your blood sugar (glucose) after you have not eaten for a while (fasting). You Zenia have this done every 1-3 years.  Mammogram. This Haylee be done every 1-2 years. Talk with your health care provider about  how often you should have regular mammograms.  BRCA-related cancer screening. This Amalea be done if you have a family history of breast, ovarian, tubal, or peritoneal cancers. Other tests  Sexually transmitted disease (STD) testing.  Bone density scan. This is done to screen for osteoporosis. You Brittaney have this done starting at age 69. Follow these  instructions at home: Eating and drinking  Eat a diet that includes fresh fruits and vegetables, whole grains, lean protein, and low-fat dairy products. Limit your intake of foods with high amounts of sugar, saturated fats, and salt.  Take vitamin and mineral supplements as recommended by your health care provider.  Do not drink alcohol if your health care provider tells you not to drink.  If you drink alcohol: ? Limit how much you have to 0-1 drink a day. ? Be aware of how much alcohol is in your drink. In the [place] one drink equals one 12 oz bottle of beer (355 mL), one 5 oz glass of wine (148 mL), or one 1 oz glass of hard liquor (44 mL). Lifestyle  Take daily care of your teeth and gums.  Stay active. Exercise for at least 30 minutes on 5 or more days each week.  Do not use any products that contain nicotine or tobacco, such as cigarettes, e-cigarettes, and chewing tobacco. If you need help quitting, ask your health care provider.  If you are sexually active, practice safe sex. Use a condom or other form of protection in order to prevent STIs (sexually transmitted infections).  Talk with your health care provider about taking a low-dose aspirin or statin. What's next?  Go to your health care provider once a year for a well check visit.  Ask your health care provider how often you should have your eyes and teeth checked.  Stay up to date on all vaccines. This information is not intended to replace advice given to you by your health care provider. Make sure you discuss any questions you have with your health care provider. Document Revised: 04/09/2018 Document Reviewed: 04/09/2018 Elsevier Patient Education  2020 [employer].

## 2020-04-11 NOTE — Assessment & Plan Note (Signed)
hgba1c acceptable, minimize simple carbs. Increase exercise as tolerated.  

## 2020-04-11 NOTE — Assessment & Plan Note (Signed)
Encouraged heart healthy diet, increase exercise, avoid trans fats, consider a krill oil cap daily 

## 2020-04-11 NOTE — Assessment & Plan Note (Signed)
Supplement and monitor 

## 2020-04-11 NOTE — Assessment & Plan Note (Signed)
On Levothyroxine, continue to monitor 

## 2020-04-11 NOTE — Assessment & Plan Note (Addendum)
Patient encouraged to maintain heart healthy diet, regular exercise, adequate sleep. Consider daily probiotics. Take medications as prescribed. Labs ordered and reviewed. MGM 2/21 proceed with repeat every one to two years. Colonoscopy in 2017 repeat in 2027

## 2020-04-11 NOTE — Progress Notes (Signed)
Subjective:    Patient ID: Shannon [name], female    DOB: [birth year], 69 y.o.   MRN: [id number]  Chief Complaint  Patient presents with  . Annual Exam    HPI Patient is in today for annual preventative exam. No recent febrile illness or hospitalizations. She continues to see an endocrinology in [place] and is contemplating switching to someone here. 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 Stays active and tries to eat a heart healthy diet. Denies CP/palp/SOB/HA/congestion/fevers/GI or GU c/o. Taking meds as prescribed  Past Medical History:  Diagnosis Date  . ADD (attention deficit disorder with hyperactivity)   . Anxiety   . Asthma   . Cancer ([place])    melanoma- lip 2011  . Depression   . Diverticulosis   . Hyperlipidemia, mixed 03/11/2016  . Hypothyroidism   . Menopause   . Osteopenia   . Rosacea   . Sciatica of left side 02/24/2017  . Travel advice encounter 03/25/2016    Past Surgical History:  Procedure Laterality Date  . blephoroplasty    . BROW LIFT    . DILATION AND CURETTAGE OF UTERUS    . SKIN CANCER EXCISION     right lower lip  . TONSILLECTOMY AND ADENOIDECTOMY      Family History  Problem Relation Age of Onset  . Colon polyps Mother   . Parkinson's disease Mother   . Diabetes Father   . Emphysema Father   . Tuberculosis Father   . Colon cancer Maternal Grandfather   . Alcohol abuse Sister   . Cancer Sister        skin cancer, melanoma cell, basil cell  . ADD / ADHD Daughter   . Anxiety disorder Daughter   . Depression Daughter   . ADD / ADHD Son   . Anxiety disorder Son   . Depression Son   . Cancer Sister        melanoma  . Anxiety disorder Brother     Social History   Socioeconomic History  . Marital status: Single    Spouse name: Not on file  . Number of children: 2  . Years of education: Not on file  . Highest education level: Not on file  Occupational History  . Occupation: Retired  Tourist information centre manager  Tobacco Use  . Smoking status: Never Smoker  . Smokeless tobacco: Never Used  Vaping Use  . Vaping Use: Never used  Substance and Sexual Activity  . Alcohol use: Yes    Alcohol/week: 3.0 - 4.0 standard drinks    Types: 1 - 2 Glasses of wine, 1 Cans of beer, 1 Shots of liquor per week    Comment: not every night.  . Drug use: No  . Sexual activity: Not on file  Other Topics Concern  . Not on file  Social History Narrative   Retired from [employer].   No major dietary restrictions except avoid gluten.       Social Determinants of Health   Financial Resource Strain: Not on file  Food Insecurity: Not on file  Transportation Needs: Not on file  Physical Activity: Not on file  Stress: Not on file  Social Connections: Not on file  Intimate Partner Violence: Not on file    Outpatient Medications Prior to Visit  Medication Sig Dispense Refill  . Cholecalciferol (VITAMIN D) 125 MCG (5000 UT) CAPS Take by mouth 2 (two) times daily.    [name] escitalopram (LEXAPRO) 10  MG tablet Take 1 tablet (10 mg total) by mouth daily. 90 tablet 1  . gabapentin (NEURONTIN) 300 MG capsule TAKE (1) CAPSULE THREE TIMES DAILY. 90 capsule 0  . GAMMA AMINOBUTYRIC ACID PO Take 200 mg by mouth as needed. Up to bid    . hydrocortisone (PROCTOZONE-HC) 2.5 % rectal cream Place 1 application rectally 2 (two) times daily. 30 g 0  . Krill Oil 1000 MG CAPS     . Levothyroxine Sodium 100 MCG CAPS 1 tablet daily and 1/2 tablet on Friday    . LORazepam (ATIVAN) 2 MG tablet Take 1 tablet (2 mg total) by mouth every 8 (eight) hours as needed for anxiety. 40 tablet 0  . MAGNESIUM GLYCINATE PLUS PO Take 100 mg by mouth 2 (two) times daily.    [name] nystatin ointment (MYCOSTATIN)     . thyroid (ARMOUR THYROID) 15 MG tablet Take by mouth. 7.5 mg qd     No facility-administered medications prior to visit.    Allergies  Allergen Reactions  . Hydrocodone-Acetaminophen Nausea And Vomiting  .  Hydrocodone-Acetaminophen Nausea And Vomiting  . Gluten Meal Other (See Comments)    GI symptoms; has BM everytime she urinates GI symptoms; has BM everytime she urinates  . Oxycodone Other (See Comments)    Review of Systems  Constitutional: Negative for chills, fever and malaise/fatigue.  HENT: Negative for congestion and hearing loss.   Eyes: Negative for discharge.  Respiratory: Negative for cough, sputum production and shortness of breath.   Cardiovascular: Negative for chest pain, palpitations and leg swelling.  Gastrointestinal: Negative for abdominal pain, blood in stool, constipation, diarrhea, heartburn, nausea and vomiting.  Genitourinary: Negative for dysuria, frequency, hematuria and urgency.  Musculoskeletal: Negative for back pain, falls and myalgias.  Skin: Negative for rash.  Neurological: Negative for dizziness, sensory change, loss of consciousness, weakness and headaches.  Endo/Heme/Allergies: Negative for environmental allergies. Does not bruise/bleed easily.  Psychiatric/Behavioral: Positive for memory loss. Negative for depression and suicidal ideas. The patient is not nervous/anxious and does not have insomnia.        Objective:    Physical Exam Constitutional:      General: She is not in acute distress.    Appearance: She is well-developed and well-nourished.  HENT:     Head: Normocephalic and atraumatic.  Eyes:     Conjunctiva/sclera: Conjunctivae normal.  Neck:     Thyroid: No thyromegaly.  Cardiovascular:     Rate and Rhythm: Normal rate and regular rhythm.     Heart sounds: Normal heart sounds. No murmur heard.   Pulmonary:     Effort: Pulmonary effort is normal. No respiratory distress.     Breath sounds: Normal breath sounds.  Abdominal:     General: Bowel sounds are normal. There is no distension.     Palpations: Abdomen is soft. There is no mass.     Tenderness: There is no abdominal tenderness.  Musculoskeletal:        General: No  edema.     Cervical back: Neck supple.  Lymphadenopathy:     Cervical: No cervical adenopathy.  Skin:    General: Skin is warm and dry.  Neurological:     Mental Status: She is alert and oriented to person, place, and time.  Psychiatric:        Mood and Affect: Mood and affect normal.        Behavior: Behavior normal.     BP 102/64   Pulse 68  Temp (!) 97.5 F (36.4 C) (Oral)   Resp 16   Ht 5' 5.5" (1.664 m)   Wt 176 lb (79.8 kg)   LMP 03/29/2004   SpO2 98%   BMI 28.84 kg/m  Wt Readings from Last 3 Encounters:  04/11/20 176 lb (79.8 kg)  09/02/19 178 lb (80.7 kg)  07/20/19 176 lb (79.8 kg)    Diabetic Foot Exam - Simple   No data filed    Lab Results  Component Value Date   WBC 6.3 04/08/2019   HGB 14.0 04/08/2019   HCT 41.6 04/08/2019   PLT 253.0 04/08/2019   GLUCOSE 88 09/02/2019   CHOL 184 09/02/2019   TRIG 61.0 09/02/2019   HDL 59.90 09/02/2019   LDLCALC 112 (H) 09/02/2019   ALT 16 09/02/2019   AST 27 09/02/2019   NA 139 09/02/2019   K 4.8 09/02/2019   CL 106 09/02/2019   CREATININE 0.94 09/02/2019   BUN 18 09/02/2019   CO2 30 09/02/2019   TSH 0.35 09/02/2019   HGBA1C 5.2 09/02/2019   MICROALBUR 0.2 02/28/2014    Lab Results  Component Value Date   TSH 0.35 09/02/2019   Lab Results  Component Value Date   WBC 6.3 04/08/2019   HGB 14.0 04/08/2019   HCT 41.6 04/08/2019   MCV 97.7 04/08/2019   PLT 253.0 04/08/2019   Lab Results  Component Value Date   NA 139 09/02/2019   K 4.8 09/02/2019   CO2 30 09/02/2019   GLUCOSE 88 09/02/2019   BUN 18 09/02/2019   CREATININE 0.94 09/02/2019   BILITOT 0.9 09/02/2019   ALKPHOS 59 09/02/2019   AST 27 09/02/2019   ALT 16 09/02/2019   PROT 6.5 09/02/2019   ALBUMIN 4.3 09/02/2019   CALCIUM 9.5 09/02/2019   GFR 59.05 (L) 09/02/2019   Lab Results  Component Value Date   CHOL 184 09/02/2019   Lab Results  Component Value Date   HDL 59.90 09/02/2019   Lab Results  Component Value Date    LDLCALC 112 (H) 09/02/2019   Lab Results  Component Value Date   TRIG 61.0 09/02/2019   Lab Results  Component Value Date   CHOLHDL 3 09/02/2019   Lab Results  Component Value Date   HGBA1C 5.2 09/02/2019       Assessment & Plan:   Problem List Items Addressed This Visit    Hypothyroidism    On Levothyroxine, continue to monitor      Vitamin D deficiency    Supplement and monitor      Hyperlipidemia, mixed    Encouraged heart healthy diet, increase exercise, avoid trans fats, consider a krill oil cap daily      Preventative health care    Patient encouraged to maintain heart healthy diet, regular exercise, adequate sleep. Consider daily probiotics. Take medications as prescribed. Labs ordered and reviewed. MGM 2/21 proceed with repeat every one to two years. Colonoscopy in 2017 repeat in 2027      Hyperglycemia    hgba1c acceptable, minimize simple carbs. Increase exercise as tolerated.       Other Visit Diagnoses    High vitamin D level    -  Primary      I have discontinued Tyniya H. Stenglein's thyroid. I am also having her maintain her Vitamin D, GAMMA AMINOBUTYRIC ACID PO, MAGNESIUM GLYCINATE PLUS PO, hydrocortisone, Krill Oil, gabapentin, nystatin ointment, LORazepam, Levothyroxine Sodium, and escitalopram.  No orders of the defined types were placed in this  encounter.    Penni Homans, MD

## 2020-04-11 NOTE — Assessment & Plan Note (Signed)
Encouraged to get adequate exercise, calcium and vitamin d intake 

## 2020-04-12 ENCOUNTER — Encounter: Payer: Self-pay | Admitting: Family Medicine

## 2020-04-13 ENCOUNTER — Other Ambulatory Visit: Payer: Self-pay | Admitting: Family Medicine

## 2020-04-13 MED ORDER — ESCITALOPRAM OXALATE 5 MG PO TABS: 5.0000 mg | ORAL_TABLET | Freq: Every day | ORAL | 2 refills | Status: DC

## 2020-04-13 NOTE — Telephone Encounter (Signed)
Please advise- he is requesting 5mg  Lexapro?

## 2020-05-01 DIAGNOSIS — M542 Cervicalgia: Secondary | ICD-10-CM | POA: Diagnosis not present

## 2020-05-02 ENCOUNTER — Telehealth: Payer: Self-pay | Admitting: Family Medicine

## 2020-05-02 NOTE — Telephone Encounter (Signed)
Spoke with patient she was driving and stated she will call back.

## 2020-05-09 DIAGNOSIS — Z1152 Encounter for screening for COVID-19: Secondary | ICD-10-CM | POA: Diagnosis not present

## 2020-05-10 DIAGNOSIS — Z20822 Contact with and (suspected) exposure to covid-19: Secondary | ICD-10-CM | POA: Diagnosis not present

## 2020-05-24 ENCOUNTER — Encounter: Payer: Self-pay | Admitting: Family Medicine

## 2020-05-24 DIAGNOSIS — U071 COVID-19: Secondary | ICD-10-CM | POA: Diagnosis not present

## 2020-06-01 DIAGNOSIS — M542 Cervicalgia: Secondary | ICD-10-CM | POA: Diagnosis not present

## 2020-06-13 ENCOUNTER — Encounter: Payer: Self-pay | Admitting: Family Medicine

## 2020-06-13 DIAGNOSIS — Z1231 Encounter for screening mammogram for malignant neoplasm of breast: Secondary | ICD-10-CM | POA: Diagnosis not present

## 2020-06-13 DIAGNOSIS — Z803 Family history of malignant neoplasm of breast: Secondary | ICD-10-CM | POA: Diagnosis not present

## 2020-06-14 LAB — HM MAMMOGRAPHY

## 2020-06-21 DIAGNOSIS — M542 Cervicalgia: Secondary | ICD-10-CM | POA: Diagnosis not present

## 2020-07-05 DIAGNOSIS — M542 Cervicalgia: Secondary | ICD-10-CM | POA: Diagnosis not present

## 2020-07-10 DIAGNOSIS — H52203 Unspecified astigmatism, bilateral: Secondary | ICD-10-CM | POA: Diagnosis not present

## 2020-07-10 DIAGNOSIS — H25813 Combined forms of age-related cataract, bilateral: Secondary | ICD-10-CM | POA: Diagnosis not present

## 2020-07-10 DIAGNOSIS — H02831 Dermatochalasis of right upper eyelid: Secondary | ICD-10-CM | POA: Diagnosis not present

## 2020-07-10 DIAGNOSIS — H02834 Dermatochalasis of left upper eyelid: Secondary | ICD-10-CM | POA: Diagnosis not present

## 2020-07-10 DIAGNOSIS — H524 Presbyopia: Secondary | ICD-10-CM | POA: Diagnosis not present

## 2020-07-10 DIAGNOSIS — H43813 Vitreous degeneration, bilateral: Secondary | ICD-10-CM | POA: Diagnosis not present

## 2020-07-10 DIAGNOSIS — H5213 Myopia, bilateral: Secondary | ICD-10-CM | POA: Diagnosis not present

## 2020-07-13 ENCOUNTER — Telehealth: Payer: Self-pay

## 2020-07-13 ENCOUNTER — Other Ambulatory Visit: Payer: Self-pay

## 2020-07-13 ENCOUNTER — Ambulatory Visit (INDEPENDENT_AMBULATORY_CARE_PROVIDER_SITE_OTHER): Payer: Medicare Other

## 2020-07-13 ENCOUNTER — Other Ambulatory Visit: Payer: Self-pay | Admitting: Family Medicine

## 2020-07-13 VITALS — BP 124/82 | HR 63 | Temp 98.0°F | Resp 16 | Ht 65.5 in | Wt 175.0 lb

## 2020-07-13 DIAGNOSIS — Z0000 Encounter for general adult medical examination without abnormal findings: Secondary | ICD-10-CM

## 2020-07-13 MED ORDER — FLUCONAZOLE 150 MG PO TABS: 150.0000 mg | ORAL_TABLET | ORAL | 0 refills | Status: DC

## 2020-07-13 NOTE — Patient Instructions (Signed)
[name] , Thank you for taking time to come for your Medicare Wellness Visit. I appreciate your ongoing commitment to your health goals. Please review the following plan we discussed and let me know if I can assist you in the future.   Screening recommendations/referrals: Colonoscopy: Completed 03/07/2016-Due 03/07/2026 Mammogram: Completed 06/14/2020-Due 06/14/2021 Bone Density: Due-Postponed until next year. Recommended yearly ophthalmology/optometry visit for glaucoma screening and checkup Recommended yearly dental visit for hygiene and checkup  Vaccinations: Influenza vaccine: Up to date Pneumococcal vaccine: Completed vaccines Tdap vaccine: Up to date-Due 2031 Shingles vaccine: Completed vaccines  Covid-19:Completed vaccines  Advanced directives: Copy in chart  Conditions/risks identified: See problem list  Next appointment: Follow up in one year for your annual wellness visit 07/19/2021 @ 2:40   Preventive Care 70 Years and Older, Female Preventive care refers to lifestyle choices and visits with your health care provider that can promote health and wellness. What does preventive care include?  A yearly physical exam. This is also called an annual well check.  Dental exams once or twice a year.  Routine eye exams. Ask your health care provider how often you should have your eyes checked.  Personal lifestyle choices, including:  Daily care of your teeth and gums.  Regular physical activity.  Eating a healthy diet.  Avoiding tobacco and drug use.  Limiting alcohol use.  Practicing safe sex.  Taking low-dose aspirin every day.  Taking vitamin and mineral supplements as recommended by your health care provider. What happens during an annual well check? The services and screenings done by your health care provider during your annual well check will depend on your age, overall health, lifestyle risk factors, and family history of disease. Counseling  Your health care  provider Zelda ask you questions about your:  Alcohol use.  Tobacco use.  Drug use.  Emotional well-being.  Home and relationship well-being.  Sexual activity.  Eating habits.  History of falls.  Memory and ability to understand (cognition).  Work and work Statistician.  Reproductive health. Screening  You Carson have the following tests or measurements:  Height, weight, and BMI.  Blood pressure.  Lipid and cholesterol levels. These Jaline be checked every 5 years, or more frequently if you are over 70 years old.  Skin check.  Lung cancer screening. You Ivry have this screening every year starting at age 70 if you have a 30-pack-year history of smoking and currently smoke or have quit within the past 15 years.  Fecal occult blood test (FOBT) of the stool. You Shakeeta have this test every year starting at age 70.  Flexible sigmoidoscopy or colonoscopy. You Jose have a sigmoidoscopy every 5 years or a colonoscopy every 10 years starting at age 70.  Hepatitis C blood test.  Hepatitis B blood test.  Sexually transmitted disease (STD) testing.  Diabetes screening. This is done by checking your blood sugar (glucose) after you have not eaten for a while (fasting). You Marylu have this done every 1-3 years.  Bone density scan. This is done to screen for osteoporosis. You Manda have this done starting at age 70.  Mammogram. This Roberta be done every 1-2 years. Talk to your health care provider about how often you should have regular mammograms. Talk with your health care provider about your test results, treatment options, and if necessary, the need for more tests. Vaccines  Your health care provider Fantasia recommend certain vaccines, such as:  Influenza vaccine. This is recommended every year.  Tetanus, diphtheria, and  acellular pertussis (Tdap, Td) vaccine. You Kevin need a Td booster every 10 years.  Zoster vaccine. You Audi need this after age 70.  Pneumococcal 13-valent conjugate (PCV13)  vaccine. One dose is recommended after age 70.  Pneumococcal polysaccharide (PPSV23) vaccine. One dose is recommended after age 70. Talk to your health care provider about which screenings and vaccines you need and how often you need them. This information is not intended to replace advice given to you by your health care provider. Make sure you discuss any questions you have with your health care provider. Document Released: 05/12/2015 Document Revised: 01/03/2016 Document Reviewed: 02/14/2015 Elsevier Interactive Patient Education  2017 [employer] Prevention in the Home Falls can cause injuries. They can happen to people of all ages. There are many things you can do to make your home safe and to help prevent falls. What can I do on the outside of my home?  Regularly fix the edges of walkways and driveways and fix any cracks.  Remove anything that might make you trip as you walk through a door, such as a raised step or threshold.  Trim any bushes or trees on the path to your home.  Use bright outdoor lighting.  Clear any walking paths of anything that might make someone trip, such as rocks or tools.  Regularly check to see if handrails are loose or broken. Make sure that both sides of any steps have handrails.  Any raised decks and porches should have guardrails on the edges.  Have any leaves, snow, or ice cleared regularly.  Use sand or salt on walking paths during winter.  Clean up any spills in your garage right away. This includes oil or grease spills. What can I do in the bathroom?  Use night lights.  Install grab bars by the toilet and in the tub and shower. Do not use towel bars as grab bars.  Use non-skid mats or decals in the tub or shower.  If you need to sit down in the shower, use a plastic, non-slip stool.  Keep the floor dry. Clean up any water that spills on the floor as soon as it happens.  Remove soap buildup in the tub or shower  regularly.  Attach bath mats securely with double-sided non-slip rug tape.  Do not have throw rugs and other things on the floor that can make you trip. What can I do in the bedroom?  Use night lights.  Make sure that you have a light by your bed that is easy to reach.  Do not use any sheets or blankets that are too big for your bed. They should not hang down onto the floor.  Have a firm chair that has side arms. You can use this for support while you get dressed.  Do not have throw rugs and other things on the floor that can make you trip. What can I do in the kitchen?  Clean up any spills right away.  Avoid walking on wet floors.  Keep items that you use a lot in easy-to-reach places.  If you need to reach something above you, use a strong step stool that has a grab bar.  Keep electrical cords out of the way.  Do not use floor polish or wax that makes floors slippery. If you must use wax, use non-skid floor wax.  Do not have throw rugs and other things on the floor that can make you trip. What can I do with my stairs?  Do not leave any items on the stairs.  Make sure that there are handrails on both sides of the stairs and use them. Fix handrails that are broken or loose. Make sure that handrails are as long as the stairways.  Check any carpeting to make sure that it is firmly attached to the stairs. Fix any carpet that is loose or worn.  Avoid having throw rugs at the top or bottom of the stairs. If you do have throw rugs, attach them to the floor with carpet tape.  Make sure that you have a light switch at the top of the stairs and the bottom of the stairs. If you do not have them, ask someone to add them for you. What else can I do to help prevent falls?  Wear shoes that:  Do not have high heels.  Have rubber bottoms.  Are comfortable and fit you well.  Are closed at the toe. Do not wear sandals.  If you use a stepladder:  Make sure that it is fully  opened. Do not climb a closed stepladder.  Make sure that both sides of the stepladder are locked into place.  Ask someone to hold it for you, if possible.  Clearly mark and make sure that you can see:  Any grab bars or handrails.  First and last steps.  Where the edge of each step is.  Use tools that help you move around (mobility aids) if they are needed. These include:  Canes.  Walkers.  Scooters.  Crutches.  Turn on the lights when you go into a dark area. Replace any light bulbs as soon as they burn out.  Set up your furniture so you have a clear path. Avoid moving your furniture around.  If any of your floors are uneven, fix them.  If there are any pets around you, be aware of where they are.  Review your medicines with your doctor. Some medicines can make you feel dizzy. This can increase your chance of falling. Ask your doctor what other things that you can do to help prevent falls. This information is not intended to replace advice given to you by your health care provider. Make sure you discuss any questions you have with your health care provider. Document Released: 02/09/2009 Document Revised: 09/21/2015 Document Reviewed: 05/20/2014 Elsevier Interactive Patient Education  2017 [employer].

## 2020-07-13 NOTE — Progress Notes (Signed)
Subjective:   Shannon [name] is a 70 y.o. female who presents for Medicare Annual (Subsequent) preventive examination.  Review of Systems     Cardiac Risk Factors include: advanced age (>67men, >58 women);dyslipidemia     Objective:    Today's Vitals   07/13/20 1522  BP: 124/82  Pulse: 63  Resp: 16  Temp: 98 F (36.7 C)  TempSrc: Temporal  SpO2: 95%  Weight: 175 lb (79.4 kg)  Height: 5' 5.5" (1.664 m)   Body mass index is 28.68 kg/m.  Advanced Directives 07/13/2020 03/30/2019 03/12/2018 02/24/2017 02/22/2016 02/22/2016 04/05/2015  Does Patient Have a Medical Advance Directive? Yes Yes Yes Yes Yes Yes No  Type of Paramedic of [place];Living will [place];Living will [place];Living will [place];Living will [place];Living will [place];Living will -  Does patient want to make changes to medical advance directive? - No - Patient declined - No - Patient declined - - -  Copy of [place] in Chart? Yes - validated most recent copy scanned in chart (See row information) Yes - validated most recent copy scanned in chart (See row information) Yes - validated most recent copy scanned in chart (See row information) No - copy requested No - copy requested - -  Would patient like information on creating a medical advance directive? - - - - - - No - patient declined information    Current Medications (verified) Outpatient Encounter Medications as of 07/13/2020  Medication Sig  . Cholecalciferol (VITAMIN D) 125 MCG (5000 UT) CAPS Take by mouth 2 (two) times daily.  [name] escitalopram (LEXAPRO) 5 MG tablet Take 1 tablet (5 mg total) by mouth daily.  [name] gabapentin (NEURONTIN) 300 MG capsule TAKE (1) CAPSULE THREE TIMES DAILY.  [name] GAMMA AMINOBUTYRIC ACID PO Take 200 mg by mouth as needed. Up to bid  . hydrocortisone (PROCTOZONE-HC) 2.5 % rectal cream Place 1  application rectally 2 (two) times daily.  Javier Docker Oil 1000 MG CAPS   . Levothyroxine Sodium 100 MCG CAPS 1 tablet daily and 1/2 tablet on Friday  . LORazepam (ATIVAN) 2 MG tablet Take 1 tablet (2 mg total) by mouth every 8 (eight) hours as needed for anxiety.  [name] MAGNESIUM GLYCINATE PLUS PO Take 100 mg by mouth 2 (two) times daily.  [name] nystatin ointment (MYCOSTATIN)   . thyroid (ARMOUR) 15 MG tablet Take 0.5 tablets (7.5 mg total) by mouth. 7.5 mg qd   No facility-administered encounter medications on file as of 07/13/2020.    Allergies (verified) Hydrocodone-acetaminophen, Hydrocodone-acetaminophen, Gluten meal, and Oxycodone   History: Past Medical History:  Diagnosis Date  . ADD (attention deficit disorder with hyperactivity)   . Anxiety   . Asthma   . Cancer ([place])    melanoma- lip 2011  . Depression   . Diverticulosis   . Hyperlipidemia, mixed 03/11/2016  . Hypothyroidism   . Menopause   . Osteopenia   . Rosacea   . Sciatica of left side 02/24/2017  . Travel advice encounter 03/25/2016   Past Surgical History:  Procedure Laterality Date  . blephoroplasty    . BROW LIFT    . DILATION AND CURETTAGE OF UTERUS    . SKIN CANCER EXCISION     right lower lip  . TONSILLECTOMY AND ADENOIDECTOMY     Family History  Problem Relation Age of Onset  . Colon polyps Mother   . Parkinson's  disease Mother   . Diabetes Father   . Emphysema Father   . Tuberculosis Father   . Colon cancer Maternal Grandfather   . Alcohol abuse Sister   . Cancer Sister        skin cancer, melanoma cell, basil cell  . ADD / ADHD Daughter   . Anxiety disorder Daughter   . Depression Daughter   . ADD / ADHD Son   . Anxiety disorder Son   . Depression Son   . Cancer Sister        melanoma  . Anxiety disorder Brother    Social History   Socioeconomic History  . Marital status: Single    Spouse name: Not on file  . Number of children: 2  . Years of education: Not on file  . Highest  education level: Not on file  Occupational History  . Occupation: Retired Tourist information centre manager  Tobacco Use  . Smoking status: Never Smoker  . Smokeless tobacco: Never Used  Vaping Use  . Vaping Use: Never used  Substance and Sexual Activity  . Alcohol use: Yes    Alcohol/week: 3.0 - 4.0 standard drinks    Types: 1 - 2 Glasses of wine, 1 Cans of beer, 1 Shots of liquor per week    Comment: not every night.  . Drug use: No  . Sexual activity: Not on file  Other Topics Concern  . Not on file  Social History Narrative   Retired from [employer].   No major dietary restrictions except avoid gluten.       Social Determinants of Health   Financial Resource Strain: Low Risk   . Difficulty of Paying Living Expenses: Not hard at all  Food Insecurity: No Food Insecurity  . Worried About Charity fundraiser in the Last Year: Never true  . Ran Out of Food in the Last Year: Never true  Transportation Needs: No Transportation Needs  . Lack of Transportation (Medical): No  . Lack of Transportation (Non-Medical): No  Physical Activity: Insufficiently Active  . Days of Exercise per Week: 2 days  . Minutes of Exercise per Session: 50 min  Stress: No Stress Concern Present  . Feeling of Stress : Not at all  Social Connections: Moderately Integrated  . Frequency of Communication with Friends and Family: More than three times a week  . Frequency of Social Gatherings with Friends and Family: More than three times a week  . Attends Religious Services: More than 4 times per year  . Active Member of Clubs or Organizations: Yes  . Attends Archivist Meetings: More than 4 times per year  . Marital Status: Divorced    Tobacco Counseling Counseling given: Not Answered   Clinical Intake:  Pre-visit preparation completed: Yes  Pain : No/denies pain     Nutritional Status: BMI 25 -29 Overweight Nutritional Risks: None Diabetes: No  How often do you need to have someone help you  when you read instructions, pamphlets, or other written materials from your doctor or pharmacy?: 1 - Never  Diabetic?No  Interpreter Needed?: No  Information entered by :: Caroleen Hamman LPN   Activities of Daily Living In your present state of health, do you have any difficulty performing the following activities: 07/13/2020  Hearing? N  Vision? N  Difficulty concentrating or making decisions? Y  Comment occasionally  Walking or climbing stairs? N  Dressing or bathing? N  Doing errands, shopping? N  Preparing Food and eating ? N  Using the Toilet? N  In the past six months, have you accidently leaked urine? Y  Comment occasionally  Do you have problems with loss of bowel control? N  Managing your Medications? N  Managing your Finances? N  Housekeeping or managing your Housekeeping? N  Some recent data might be hidden    Patient Care Team: Mosie Lukes, MD as PCP - General (Family Medicine)  Indicate any recent Medical Services you Allaya have received from other than [facility] providers in the past year (date Crysten be approximate).     Assessment:   This is a routine wellness examination for Shannon [name].  Hearing/Vision screen  Hearing Screening   125Hz  250Hz  500Hz  1000Hz  2000Hz  3000Hz  4000Hz  6000Hz  8000Hz   Right ear:           Left ear:           Comments: No issues  Vision Screening Comments: Wears contacts Last eye exam-06/2020-Dr. Bull  Dietary issues and exercise activities discussed: Current Exercise Habits: Home exercise routine, Type of exercise: strength training/weights;Other - see comments (cardio), Time (Minutes): 40, Frequency (Times/Week): 2, Weekly Exercise (Minutes/Week): 80, Intensity: Mild, Exercise limited by: None identified  Goals    . Patient Stated     Control sugar intake     . Patient Stated     Eat healthier & increase activity      Depression Screen PHQ 2/9 Scores 07/13/2020 04/11/2020 03/30/2019 03/12/2018 02/24/2017 02/22/2016 04/05/2015  PHQ -  2 Score 0 0 0 0 0 0 0  Exception Documentation - - - - - - Other- indicate reason in comment box    Fall Risk Fall Risk  07/13/2020 04/11/2020 03/30/2019 03/12/2018 02/24/2017  Falls in the past year? 1 0 0 0 No  Number falls in past yr: 0 0 - - -  Injury with Fall? 0 0 - - -  Risk for fall due to : History of fall(s) - - - -  Follow up Falls prevention discussed - Education provided;Falls prevention discussed - -    FALL RISK PREVENTION PERTAINING TO THE HOME:  Any stairs in or around the home? Yes  If so, are there any without handrails? Yes  Home free of loose throw rugs in walkways, pet beds, electrical cords, etc? Yes  Adequate lighting in your home to reduce risk of falls? Yes   ASSISTIVE DEVICES UTILIZED TO PREVENT FALLS:  Life alert? No  Use of a cane, walker or w/c? No  Grab bars in the bathroom? Yes  Shower chair or bench in shower? No  Elevated toilet seat or a handicapped toilet? No   TIMED UP AND GO:  Was the test performed? Yes .  Length of time to ambulate 10 feet: 9 sec.   Gait steady and fast without use of assistive device  Cognitive Function:Normal cognitive status assessed by direct observation by this Nurse Health Advisor. No abnormalities found.       6CIT Screen 07/13/2020  What Year? 0 points  What month? 0 points  What time? 0 points  Count back from 20 0 points  Months in reverse 0 points  Repeat phrase 0 points  Total Score 0    Immunizations Immunization History  Administered Date(s) Administered  . Influenza Split 03/08/2013  . Influenza, High Dose Seasonal PF 01/14/2019  . Influenza,inj,Quad PF,6+ Mos 02/01/2020  . Influenza-Unspecified 03/07/2014, 03/10/2018  . PFIZER(Purple Top)SARS-COV-2 Vaccination 05/19/2019, 06/07/2019, 02/15/2020  . PPD Test 03/08/2013, 02/28/2014, 03/13/2015  . Pneumococcal Conjugate-13 03/11/2016  .  Pneumococcal Polysaccharide-23 03/02/2018  . Pneumococcal-Unspecified 04/08/2011  . Tdap 09/05/2009  .  Zoster 02/28/2014  . Zoster Recombinat (Shingrix) 03/18/2018, 05/26/2018    TDAP status: Due, Education has been provided regarding the importance of this vaccine. Advised Margeret receive this vaccine at local pharmacy or Health Dept. Aware to provide a copy of the vaccination record if obtained from local pharmacy or Health Dept. Verbalized acceptance and understanding.  Flu Vaccine status: Up to date  Pneumococcal vaccine status: Up to date  Covid-19 vaccine status: Completed vaccines  Qualifies for Shingles Vaccine? No   Zostavax completed Yes   Shingrix Completed?: Yes  Screening Tests Health Maintenance  Topic Date Due  . TETANUS/TDAP  09/06/2019  . COVID-19 Vaccine (4 - Booster for Pfizer series) 08/15/2020  . MAMMOGRAM  06/14/2022  . COLONOSCOPY (Pts 45-43yrs Insurance coverage will need to be confirmed)  03/07/2026  . INFLUENZA VACCINE  Completed  . DEXA SCAN  Completed  . Hepatitis C Screening  Completed  . PNA vac Low Risk Adult  Completed  . HPV VACCINES  Aged Out    Health Maintenance  Health Maintenance Due  Topic Date Due  . TETANUS/TDAP  09/06/2019    Colorectal cancer screening: Type of screening: Colonoscopy. Completed 03/07/2016. Repeat every 10 years  Mammogram status: Completed Bilateral-06/14/2020. Repeat every year  Bone Density status: Due-Patient would like to postpone until next year.  Lung Cancer Screening: (Low Dose CT Chest recommended if Age 66-80 years, 30 pack-year currently smoking OR have quit w/in 15years.) does not qualify.    Additional Screening:  Hepatitis C Screening:  Completed 03/08/2013  Vision Screening: Recommended annual ophthalmology exams for early detection of glaucoma and other disorders of the eye. Is the patient up to date with their annual eye exam?  Yes  Who is the provider or what is the name of the office in which the patient attends annual eye exams? Dr. Diona Foley   Dental Screening: Recommended annual dental exams  for proper oral hygiene  Community Resource Referral / Chronic Care Management: CRR required this visit?  No   CCM required this visit?  No      Plan:     I have personally reviewed and noted the following in the patient's chart:   . Medical and social history . Use of alcohol, tobacco or illicit drugs  . Current medications and supplements . Functional ability and status . Nutritional status . Physical activity . Advanced directives . List of other physicians . Hospitalizations, surgeries, and ER visits in previous 12 months . Vitals . Screenings to include cognitive, depression, and falls . Referrals and appointments  In addition, I have reviewed and discussed with patient certain preventive protocols, quality metrics, and best practice recommendations. A written personalized care plan for preventive services as well as general preventive health recommendations were provided to patient.   Patient to access avs on mychart   Marta Antu, [place]   07/30/4740  Nurse Health Advisor  Nurse Notes: Patient is requesting a prescription for Diflucan for a yeast infection. Message sent to PCP.

## 2020-07-13 NOTE — Telephone Encounter (Signed)
I have sent in 2 diflucan pilss she can take one now and if she still has symptoms in a week she can repeat

## 2020-07-13 NOTE — Telephone Encounter (Signed)
Patient is requesting a prescription for Diflucan. She c/o vaginal itching. She states she took some antibiotics a couple of months ago from the dentist & he gave her something for yeast & it went away but the symptoms have returned. She called his office but he would not give her another prescription because he said it had been too long .

## 2020-07-17 ENCOUNTER — Ambulatory Visit (INDEPENDENT_AMBULATORY_CARE_PROVIDER_SITE_OTHER): Payer: Medicare Other | Admitting: Family

## 2020-07-17 ENCOUNTER — Other Ambulatory Visit: Payer: Self-pay

## 2020-07-17 ENCOUNTER — Encounter: Payer: Self-pay | Admitting: Family

## 2020-07-17 VITALS — BP 124/70 | HR 71 | Temp 99.0°F | Ht 65.5 in | Wt 173.2 lb

## 2020-07-17 DIAGNOSIS — E039 Hypothyroidism, unspecified: Secondary | ICD-10-CM

## 2020-07-17 DIAGNOSIS — R350 Frequency of micturition: Secondary | ICD-10-CM | POA: Diagnosis not present

## 2020-07-17 DIAGNOSIS — R5383 Other fatigue: Secondary | ICD-10-CM

## 2020-07-17 DIAGNOSIS — R7989 Other specified abnormal findings of blood chemistry: Secondary | ICD-10-CM

## 2020-07-17 DIAGNOSIS — E538 Deficiency of other specified B group vitamins: Secondary | ICD-10-CM

## 2020-07-17 DIAGNOSIS — R200 Anesthesia of skin: Secondary | ICD-10-CM

## 2020-07-17 DIAGNOSIS — R202 Paresthesia of skin: Secondary | ICD-10-CM | POA: Diagnosis not present

## 2020-07-17 NOTE — Progress Notes (Addendum)
Shannon [name] is a 70 y.o. female with the following history as recorded in [employer]:  Patient Active Problem List   Diagnosis Date Noted  . Osteopenia 04/11/2020  . Neurofibroma 07/20/2019  . History of melanoma 05/14/2019  . Vertigo 04/12/2019  . Bilateral carotid bruits 04/12/2019  . Cervical cancer screening 04/08/2019  . Neck pain on left side 09/10/2018  . Lead exposure 03/02/2018  . Heart murmur 03/02/2018  . 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  . Hyperglycemia 02/24/2017  . Lumbar degenerative disc disease 02/24/2017  . Urinary frequency 08/05/2016  . Preventative health care 03/25/2016  . Hyperlipidemia, mixed 03/11/2016  . Melanoma of skin ([place]) 03/11/2016  . BMI 29.0-29.9,adult 03/13/2015  . Vitamin D deficiency 02/28/2014  . Hypothyroidism 03/08/2013  . Adjustment disorder with mixed anxiety and depressed mood 03/08/2013  . Nuclear sclerotic cataract of both eyes 07/23/2012  . Knee pain 09/26/2010  . HAMMER TOE, ACQUIRED 03/15/2009    Current Outpatient Medications  Medication Sig Dispense Refill  . Cholecalciferol (VITAMIN D) 125 MCG (5000 UT) CAPS Take by mouth 2 (two) times daily.    [name] escitalopram (LEXAPRO) 5 MG tablet Take 1 tablet (5 mg total) by mouth daily. 90 tablet 2  . fluconazole (DIFLUCAN) 150 MG tablet Take 1 tablet (150 mg total) by mouth once a week. 2 tablet 0  . GAMMA AMINOBUTYRIC ACID PO Take 200 mg by mouth as needed. Up to bid    . hydrocortisone (PROCTOZONE-HC) 2.5 % rectal cream Place 1 application rectally 2 (two) times daily. 30 g 0  . Krill Oil 1000 MG CAPS     . Levothyroxine Sodium 100 MCG CAPS 1 tablet daily and 1/2 tablet on Friday 30 capsule   . LORazepam (ATIVAN) 2 MG tablet Take 1 tablet (2 mg total) by mouth every 8 (eight) hours as needed for anxiety. 40 tablet 0  . MAGNESIUM GLYCINATE PLUS PO Take 100 mg by mouth 2 (two) times daily.    [name] nystatin ointment (MYCOSTATIN)     . thyroid (ARMOUR) 15 MG tablet Take 0.5 tablets  (7.5 mg total) by mouth. 7.5 mg qd    . gabapentin (NEURONTIN) 300 MG capsule TAKE (1) CAPSULE THREE TIMES DAILY. (Patient not taking: Reported on 07/17/2020) 90 capsule 0   No current facility-administered medications for this visit.    Allergies: Hydrocodone-acetaminophen, Hydrocodone-acetaminophen, Gluten meal, and Oxycodone  Past Medical History:  Diagnosis Date  . ADD (attention deficit disorder with hyperactivity)   . Anxiety   . Asthma   . Cancer ([place])    melanoma- lip 2011  . Depression   . Diverticulosis   . Hyperlipidemia, mixed 03/11/2016  . Hypothyroidism   . Menopause   . Osteopenia   . Rosacea   . Sciatica of left side 02/24/2017  . Travel advice encounter 03/25/2016    Past Surgical History:  Procedure Laterality Date  . blephoroplasty    . BROW LIFT    . DILATION AND CURETTAGE OF UTERUS    . SKIN CANCER EXCISION     right lower lip  . TONSILLECTOMY AND ADENOIDECTOMY      Family History  Problem Relation Age of Onset  . Colon polyps Mother   . Parkinson's disease Mother   . Diabetes Father   . Emphysema Father   . Tuberculosis Father   . Colon cancer Maternal Grandfather   . Alcohol abuse Sister   . Cancer Sister        skin cancer, melanoma  cell, basil cell  . ADD / ADHD Daughter   . Anxiety disorder Daughter   . Depression Daughter   . ADD / ADHD Son   . Anxiety disorder Son   . Depression Son   . Cancer Sister        melanoma  . Anxiety disorder Brother     Social History   Tobacco Use  . Smoking status: Never Smoker  . Smokeless tobacco: Never Used  Substance Use Topics  . Alcohol use: Yes    Alcohol/week: 3.0 - 4.0 standard drinks    Types: 1 - 2 Glasses of wine, 1 Cans of beer, 1 Shots of liquor per week    Comment: not every night.    Subjective:  Presents with "vague" symptoms of just not feeling well for the past 2 weeks; having increased fatigue/ muscle cramps, urinary frequency; feels like her appetite is down slightly; not  prone to UTIs- has started Weight Watchers in the past few weeks so initially attributed to drinking more water;  Has thyroid level managed by endocrinologist in [place]- last level was checked in December;    Objective:  Vitals:   07/17/20 1553  BP: 124/70  Pulse: 71  Temp: 99 F (37.2 C)  TempSrc: Oral  SpO2: 98%  Weight: 173 lb 3.2 oz (78.6 kg)  Height: 5' 5.5" (1.664 m)    General: Well developed, well nourished, in no acute distress  Skin : Warm and dry.  Head: Normocephalic and atraumatic  Eyes: Sclera and conjunctiva clear; pupils round and reactive to light; extraocular movements intact  Ears: External normal; canals clear; tympanic membranes normal  Oropharynx: Pink, supple. No suspicious lesions  Neck: Supple without thyromegaly, adenopathy  Lungs: Respirations unlabored; clear to auscultation bilaterally without wheeze, rales, rhonchi  CVS exam: normal rate and regular rhythm.  Musculoskeletal: No deformities; no active joint inflammation  Extremities: No edema, cyanosis, clubbing  Vessels: Symmetric bilaterally  Neurologic: Alert and oriented; speech intact; face symmetrical; moves all extremities well; CNII-XII intact without focal deficit   Assessment:  1. Other fatigue   2. Urinary frequency   3. Numbness and tingling   4. Low vitamin B12 level     Plan:  Update labs and urine culture today; physical exam is reassuring- patient well appearing in office; follow up to be determined;  This visit occurred during the SARS-CoV-2 public health emergency.  Safety protocols were in place, including screening questions prior to the visit, additional usage of staff PPE, and extensive cleaning of exam room while observing appropriate contact time as indicated for disinfecting solutions.     No follow-ups on file.  Orders Placed This Encounter  Procedures  . Urine Culture    Standing Status:   Future    Standing Expiration Date:   07/17/2021  . CBC with  Differential/Platelet    Standing Status:   Future    Standing Expiration Date:   07/17/2021  . Comp Met (CMET)    Standing Status:   Future    Standing Expiration Date:   07/17/2021  . B12    Standing Status:   Future    Standing Expiration Date:   07/17/2021  . Magnesium    Standing Status:   Future    Standing Expiration Date:   07/17/2021    Requested Prescriptions    No prescriptions requested or ordered in this encounter     addnd J Copland 3/23- labs send to me as ordering provider is out of  town and PCP is not in the office, and she has a UTI which needs to be treated  Called in keflex for her TSH is a bit low She has already decreased her thyroid replacement a bit as she usually does in the summer months- for some reason she seems to need less replacement over the summer  She is seeing Dr Charlett Blake in [place] and will recheck TSH then   Results for orders placed or performed in visit on 07/17/20  Urine Culture   Specimen: Urine  Result Value Ref Range   Source: NOT GIVEN    Status: FINAL    Isolate 1: Escherichia coli (A)       Susceptibility   Escherichia coli - URINE CULTURE, REFLEX    AMOX/CLAVULANIC <=2 Sensitive     AMPICILLIN 4 Sensitive     AMPICILLIN/SULBACTAM <=2 Sensitive     CEFAZOLIN* <=4 Not Reportable      * For infections other than uncomplicated UTIcaused by E. coli, K. pneumoniae or P. mirabilis:Cefazolin is resistant if MIC > or = 8 mcg/mL.(Distinguishing susceptible versus intermediatefor isolates with MIC < or = 4 mcg/mL requiresadditional testing.)For uncomplicated UTI caused by E. coli,K. pneumoniae or P. mirabilis: Cefazolin issusceptible if MIC <32 mcg/mL and predictssusceptible to the oral agents cefaclor, cefdinir,cefpodoxime, cefprozil, cefuroxime, cephalexinand loracarbef.    CEFEPIME <=1 Sensitive     CEFTRIAXONE <=1 Sensitive     CIPROFLOXACIN <=0.25 Sensitive     LEVOFLOXACIN <=0.12 Sensitive     ERTAPENEM <=0.5 Sensitive     GENTAMICIN <=1  Sensitive     IMIPENEM <=0.25 Sensitive     NITROFURANTOIN <=16 Sensitive     PIP/TAZO <=4 Sensitive     TOBRAMYCIN <=1 Sensitive     TRIMETH/SULFA* <=20 Sensitive      * For infections other than uncomplicated UTIcaused by E. coli, K. pneumoniae or P. mirabilis:Cefazolin is resistant if MIC > or = 8 mcg/mL.(Distinguishing susceptible versus intermediatefor isolates with MIC < or = 4 mcg/mL requiresadditional testing.)For uncomplicated UTI caused by E. coli,K. pneumoniae or P. mirabilis: Cefazolin issusceptible if MIC <32 mcg/mL and predictssusceptible to the oral agents cefaclor, cefdinir,cefpodoxime, cefprozil, cefuroxime, cephalexinand loracarbef.Legend:S = Susceptible  I = IntermediateR = Resistant  NS = Not susceptible* = Not tested  NR = Not reported**NN = See antimicrobic comments  TSH  Result Value Ref Range   TSH 0.19 (L) 0.35 - 4.50 uIU/mL  Magnesium  Result Value Ref Range   Magnesium 2.1 1.5 - 2.5 mg/dL  CBC with Differential/Platelet  Result Value Ref Range   WBC 7.3 4.0 - 10.5 K/uL   RBC 3.76 (L) 3.87 - 5.11 Mil/uL   Hemoglobin 12.5 12.0 - 15.0 g/dL   HCT 36.6 36.0 - 46.0 %   MCV 97.3 78.0 - 100.0 fl   MCHC 34.2 30.0 - 36.0 g/dL   RDW 12.3 11.5 - 15.5 %   Platelets 245.0 150.0 - 400.0 K/uL   Neutrophils Relative % 70.5 43.0 - 77.0 %   Lymphocytes Relative 18.8 12.0 - 46.0 %   Monocytes Relative 8.2 3.0 - 12.0 %   Eosinophils Relative 1.3 0.0 - 5.0 %   Basophils Relative 1.2 0.0 - 3.0 %   Neutro Abs 5.1 1.4 - 7.7 K/uL   Lymphs Abs 1.4 0.7 - 4.0 K/uL   Monocytes Absolute 0.6 0.1 - 1.0 K/uL   Eosinophils Absolute 0.1 0.0 - 0.7 K/uL   Basophils Absolute 0.1 0.0 - 0.1 K/uL  Comp Met (CMET)  Result  Value Ref Range   Sodium 139 135 - 145 mEq/L   Potassium 4.6 3.5 - 5.1 mEq/L   Chloride 103 96 - 112 mEq/L   CO2 29 19 - 32 mEq/L   Glucose, Bld 98 70 - 99 mg/dL   BUN 17 6 - 23 mg/dL   Creatinine, Ser 0.92 0.40 - 1.20 mg/dL   Total Bilirubin 0.8 0.2 - 1.2 mg/dL    Alkaline Phosphatase 68 39 - 117 U/L   AST 19 0 - 37 U/L   ALT 13 0 - 35 U/L   Total Protein 7.0 6.0 - 8.3 g/dL   Albumin 4.2 3.5 - 5.2 g/dL   GFR 63.38 >60.00 mL/min   Calcium 9.6 8.4 - 10.5 mg/dL  B12  Result Value Ref Range   Vitamin B-12 472 211 - 911 pg/mL

## 2020-07-17 NOTE — Telephone Encounter (Signed)
Pt aware of instructions

## 2020-07-18 DIAGNOSIS — D2239 Melanocytic nevi of other parts of face: Secondary | ICD-10-CM | POA: Diagnosis not present

## 2020-07-18 DIAGNOSIS — C44310 Basal cell carcinoma of skin of unspecified parts of face: Secondary | ICD-10-CM | POA: Diagnosis not present

## 2020-07-18 DIAGNOSIS — C44319 Basal cell carcinoma of skin of other parts of face: Secondary | ICD-10-CM | POA: Diagnosis not present

## 2020-07-18 LAB — CBC WITH DIFFERENTIAL/PLATELET
Basophils Absolute: 0.1 10*3/uL (ref 0.0–0.1)
Basophils Relative: 1.2 % (ref 0.0–3.0)
Eosinophils Absolute: 0.1 10*3/uL (ref 0.0–0.7)
Eosinophils Relative: 1.3 % (ref 0.0–5.0)
HCT: 36.6 % (ref 36.0–46.0)
Hemoglobin: 12.5 g/dL (ref 12.0–15.0)
Lymphocytes Relative: 18.8 % (ref 12.0–46.0)
Lymphs Abs: 1.4 10*3/uL (ref 0.7–4.0)
MCHC: 34.2 g/dL (ref 30.0–36.0)
MCV: 97.3 fl (ref 78.0–100.0)
Monocytes Absolute: 0.6 10*3/uL (ref 0.1–1.0)
Monocytes Relative: 8.2 % (ref 3.0–12.0)
Neutro Abs: 5.1 10*3/uL (ref 1.4–7.7)
Neutrophils Relative %: 70.5 % (ref 43.0–77.0)
Platelets: 245 10*3/uL (ref 150.0–400.0)
RBC: 3.76 Mil/uL — ABNORMAL LOW (ref 3.87–5.11)
RDW: 12.3 % (ref 11.5–15.5)
WBC: 7.3 10*3/uL (ref 4.0–10.5)

## 2020-07-18 LAB — COMPREHENSIVE METABOLIC PANEL
ALT: 13 U/L (ref 0–35)
AST: 19 U/L (ref 0–37)
Albumin: 4.2 g/dL (ref 3.5–5.2)
Alkaline Phosphatase: 68 U/L (ref 39–117)
BUN: 17 mg/dL (ref 6–23)
CO2: 29 mEq/L (ref 19–32)
Calcium: 9.6 mg/dL (ref 8.4–10.5)
Chloride: 103 mEq/L (ref 96–112)
Creatinine, Ser: 0.92 mg/dL (ref 0.40–1.20)
GFR: 63.38 mL/min (ref >60.00)
Glucose, Bld: 98 mg/dL (ref 70–99)
Potassium: 4.6 mEq/L (ref 3.5–5.1)
Sodium: 139 mEq/L (ref 135–145)
Total Bilirubin: 0.8 mg/dL (ref 0.2–1.2)
Total Protein: 7 g/dL (ref 6.0–8.3)

## 2020-07-18 LAB — TSH: TSH: 0.19 u[IU]/mL — ABNORMAL LOW (ref 0.35–4.50)

## 2020-07-18 LAB — MAGNESIUM: Magnesium: 2.1 mg/dL (ref 1.5–2.5)

## 2020-07-18 LAB — VITAMIN B12: Vitamin B-12: 472 pg/mL (ref 211–911)

## 2020-07-19 LAB — URINE CULTURE

## 2020-07-19 MED ORDER — CEPHALEXIN 500 MG PO CAPS: 500.0000 mg | ORAL_CAPSULE | Freq: Two times a day (BID) | ORAL | 0 refills | Status: DC

## 2020-07-19 NOTE — Addendum Note (Signed)
Addended by: Lamar Blinks C on: 07/19/2020 04:29 PM   Modules accepted: Orders

## 2020-07-19 NOTE — Addendum Note (Signed)
Addended by: Randolm Idol A on: 07/19/2020 12:29 PM   Modules accepted: Orders

## 2020-07-21 DIAGNOSIS — M542 Cervicalgia: Secondary | ICD-10-CM | POA: Diagnosis not present

## 2020-08-01 ENCOUNTER — Encounter: Payer: Self-pay | Admitting: Family

## 2020-08-01 ENCOUNTER — Other Ambulatory Visit: Payer: Medicare Other

## 2020-08-01 ENCOUNTER — Other Ambulatory Visit: Payer: Self-pay | Admitting: Family

## 2020-08-01 DIAGNOSIS — R300 Dysuria: Secondary | ICD-10-CM

## 2020-08-02 LAB — URINE CULTURE
MICRO NUMBER:: 11732943
SPECIMEN QUALITY:: ADEQUATE

## 2020-08-15 DIAGNOSIS — M542 Cervicalgia: Secondary | ICD-10-CM | POA: Diagnosis not present

## 2020-08-16 ENCOUNTER — Other Ambulatory Visit: Payer: Self-pay

## 2020-08-16 ENCOUNTER — Ambulatory Visit (INDEPENDENT_AMBULATORY_CARE_PROVIDER_SITE_OTHER): Payer: Medicare Other | Admitting: Internal Medicine

## 2020-08-16 VITALS — BP 113/77 | HR 53 | Temp 97.0°F | Ht 65.5 in | Wt 172.0 lb

## 2020-08-16 DIAGNOSIS — R399 Unspecified symptoms and signs involving the genitourinary system: Secondary | ICD-10-CM

## 2020-08-16 LAB — URINALYSIS
Bilirubin Urine: NEGATIVE
Hgb urine dipstick: NEGATIVE
Ketones, ur: NEGATIVE
Leukocytes,Ua: NEGATIVE
Nitrite: NEGATIVE
Specific Gravity, Urine: 1.015 (ref 1.000–1.030)
Total Protein, Urine: NEGATIVE
Urine Glucose: NEGATIVE
Urobilinogen, UA: 0.2 (ref 0.0–1.0)
pH: 7 (ref 5.0–8.0)

## 2020-08-16 LAB — POC URINALSYSI DIPSTICK (AUTOMATED)
Blood, UA: NEGATIVE
Glucose, UA: NEGATIVE
Ketones, UA: NEGATIVE
Leukocytes, UA: NEGATIVE
Nitrite, UA: NEGATIVE
Protein, UA: POSITIVE — AB
Spec Grav, UA: 1.02 (ref 1.010–1.025)
Urobilinogen, UA: 0.2 E.U./dL
pH, UA: 6.5 (ref 5.0–8.0)

## 2020-08-16 NOTE — Patient Instructions (Signed)
We are referring you to a gynecologist  If symptoms severe, please call our office

## 2020-08-16 NOTE — Progress Notes (Signed)
Subjective:    Patient ID: Shannon [name], female    DOB: 24-Jan-1951, 70 y.o.   MRN: [id number]  DOS:  08/16/2020 Type of visit - description: acute, LUTS Last month she had urinary urgency and fatigue. Was seen at another office, CBC, CMP were okay.  TSH was a slightly decrease. Urine culture showed E. coli, she was prescribed cephalexin.  She is here today because continue with urinary urgency, when she goes to urinate she needs to go back 2 to 3 additional times.  Has a history of uterine prolapse and occasional incontinence.  Denies any blood in the urine.  No dysuria per se No fever chills No lower abdominal pain. No vaginal discharge or bleeding.  No vaginal rash or itching. No blood in stools    Review of Systems See above   Past Medical History:  Diagnosis Date  . ADD (attention deficit disorder with hyperactivity)   . Anxiety   . Asthma   . Cancer ([place])    melanoma- lip 2011  . Depression   . Diverticulosis   . Hyperlipidemia, mixed 03/11/2016  . Hypothyroidism   . Menopause   . Osteopenia   . Rosacea   . Sciatica of left side 02/24/2017  . Travel advice encounter 03/25/2016    Past Surgical History:  Procedure Laterality Date  . blephoroplasty    . BROW LIFT    . DILATION AND CURETTAGE OF UTERUS    . SKIN CANCER EXCISION     right lower lip  . TONSILLECTOMY AND ADENOIDECTOMY      Allergies as of 08/16/2020      Reactions   Hydrocodone-acetaminophen Nausea And Vomiting   Gluten Meal Other (See Comments)   GI symptoms; has BM everytime she urinates GI symptoms; has BM everytime she urinates   Oxycodone Other (See Comments)      Medication List       Accurate as of August 16, 2020  9:04 PM. If you have any questions, ask your nurse or doctor.        STOP taking these medications   cephALEXin 500 MG capsule Commonly known as: KEFLEX Stopped by: Kathlene November, MD   fluconazole 150 MG tablet Commonly known as: DIFLUCAN Stopped by: Kathlene November, MD      TAKE these medications   escitalopram 5 MG tablet Commonly known as: Lexapro Take 1 tablet (5 mg total) by mouth daily.   gabapentin 300 MG capsule Commonly known as: NEURONTIN TAKE (1) CAPSULE THREE TIMES DAILY.   GAMMA AMINOBUTYRIC ACID PO Take 200 mg by mouth as needed. Up to bid   hydrocortisone 2.5 % rectal cream Commonly known as: Proctozone-HC Place 1 application rectally 2 (two) times daily.   Krill Oil 1000 MG Caps   Levothyroxine Sodium 100 MCG Caps 1 tablet daily and 1/2 tablet on Friday   LORazepam 2 MG tablet Commonly known as: ATIVAN Take 1 tablet (2 mg total) by mouth every 8 (eight) hours as needed for anxiety.   MAGNESIUM GLYCINATE PLUS PO Take 100 mg by mouth 2 (two) times daily.   nystatin ointment Commonly known as: MYCOSTATIN   thyroid 15 MG tablet Commonly known as: ARMOUR Take 0.5 tablets (7.5 mg total) by mouth. 7.5 mg qd   Vitamin D 125 MCG (5000 UT) Caps Take by mouth 2 (two) times daily.          Objective:   Physical Exam BP 113/77 (BP Location: Left Arm, Patient Position: Sitting, Cuff Size:  Large)   Pulse (!) 53   Temp (!) 97 F (36.1 C) (Temporal)   Ht 5' 5.5" (1.664 m)   Wt 172 lb (78 kg)   LMP 03/29/2004   SpO2 100%   BMI 28.19 kg/m  General:   Well developed, NAD, BMI noted.  HEENT:  Normocephalic . Face symmetric, atraumatic Abdomen:  Not distended, soft, non-tender. No rebound or rigidity.  No CVA tenderness. Skin: Not pale. Not jaundice Lower extremities: no pretibial edema bilaterally  Neurologic:  alert & oriented X3.  Speech normal, gait appropriate for age and unassisted Psych--  Cognition and judgment appear intact.  Cooperative with normal attention span and concentration.  Behavior appropriate. No anxious or depressed appearing.     Assessment     70 year old female, PMH includes hypothyroidism, DJD, anxiety, presents:  LUTS: As described above, she had a E. coli infection, treated with  cephalexin, subsequent culture negative. Urinary urgency never changed much.  No red flag symptoms.  The patient needs a thorough gynecological exam with a history of uterine prolapse. Refer to gynecology.  Check a UA and urine culture for completeness.  This visit occurred during the SARS-CoV-2 public health emergency.  Safety protocols were in place, including screening questions prior to the visit, additional usage of staff PPE, and extensive cleaning of exam room while observing appropriate contact time as indicated for disinfecting solutions.

## 2020-08-17 LAB — URINE CULTURE
MICRO NUMBER:: 11792256
Result:: NO GROWTH
SPECIMEN QUALITY:: ADEQUATE

## 2020-09-05 ENCOUNTER — Ambulatory Visit (INDEPENDENT_AMBULATORY_CARE_PROVIDER_SITE_OTHER): Payer: Medicare Other | Admitting: Obstetrics & Gynecology

## 2020-09-05 ENCOUNTER — Encounter: Payer: Self-pay | Admitting: Obstetrics & Gynecology

## 2020-09-05 ENCOUNTER — Other Ambulatory Visit: Payer: Self-pay

## 2020-09-05 VITALS — BP 106/65 | HR 67 | Ht 65.5 in | Wt 170.0 lb

## 2020-09-05 DIAGNOSIS — N816 Rectocele: Secondary | ICD-10-CM

## 2020-09-05 DIAGNOSIS — N3941 Urge incontinence: Secondary | ICD-10-CM

## 2020-09-05 MED ORDER — TOLTERODINE TARTRATE ER 4 MG PO CP24: 4.0000 mg | ORAL_CAPSULE | Freq: Every day | ORAL | 4 refills | Status: DC

## 2020-09-05 NOTE — Progress Notes (Signed)
Patient ID: Shannon [name], female   DOB: 1951/02/09, 70 y.o.   MRN: [id number]  No chief complaint on file. cc: pelvic organ prolapse and urinary incontinence  HPI Shannon [name] is a 70 y.o. female.  [id number] Postmenopausal, notices increased urinary urge, incontinence, pelvic organ bulging, difficulty evacuating rectum. She has to manually press in her vagina to complete BM. Up to date with mammography and pap. HPI  Past Medical History:  Diagnosis Date  . ADD (attention deficit disorder with hyperactivity)   . Anxiety   . Asthma   . Cancer ([place])    melanoma- lip 2011  . Depression   . Diverticulosis   . Gestational diabetes   . Hyperlipidemia, mixed 03/11/2016  . Hypothyroidism   . Menopause   . Osteopenia   . Rosacea   . Sciatica of left side 02/24/2017  . Travel advice encounter 03/25/2016    Past Surgical History:  Procedure Laterality Date  . blephoroplasty    . BROW LIFT    . DILATION AND CURETTAGE OF UTERUS    . SKIN CANCER EXCISION     right lower lip  . TONSILLECTOMY AND ADENOIDECTOMY      Family History  Problem Relation Age of Onset  . Colon polyps Mother   . Parkinson's disease Mother   . Diabetes Father   . Emphysema Father   . Tuberculosis Father   . Colon cancer Maternal Grandfather   . Alcohol abuse Sister   . Cancer Sister        skin cancer, melanoma cell, basil cell  . ADD / ADHD Daughter   . Anxiety disorder Daughter   . Depression Daughter   . ADD / ADHD Son   . Anxiety disorder Son   . Depression Son   . Cancer Sister        melanoma  . Anxiety disorder Brother     Social History Social History   Tobacco Use  . Smoking status: Never Smoker  . Smokeless tobacco: Never Used  Vaping Use  . Vaping Use: Never used  Substance Use Topics  . Alcohol use: Yes    Alcohol/week: 3.0 - 4.0 standard drinks    Types: 1 - 2 Glasses of wine, 1 Cans of beer, 1 Shots of liquor per week    Comment: not every night.  . Drug use: No    Allergies   Allergen Reactions  . Hydrocodone-Acetaminophen Nausea And Vomiting  . Gluten Meal Other (See Comments)    GI symptoms; has BM everytime she urinates GI symptoms; has BM everytime she urinates  . Oxycodone Nausea And Vomiting    Current Outpatient Medications  Medication Sig Dispense Refill  . tolterodine (DETROL LA) 4 MG 24 hr capsule Take 1 capsule (4 mg total) by mouth daily. 30 capsule 4  . Cholecalciferol (VITAMIN D) 125 MCG (5000 UT) CAPS Take by mouth 2 (two) times daily.    [name] escitalopram (LEXAPRO) 5 MG tablet Take 1 tablet (5 mg total) by mouth daily. 90 tablet 2  . gabapentin (NEURONTIN) 300 MG capsule TAKE (1) CAPSULE THREE TIMES DAILY. 90 capsule 0  . GAMMA AMINOBUTYRIC ACID PO Take 200 mg by mouth as needed. Up to bid    . hydrocortisone (PROCTOZONE-HC) 2.5 % rectal cream Place 1 application rectally 2 (two) times daily. 30 g 0  . Krill Oil 1000 MG CAPS     . Levothyroxine Sodium 100 MCG CAPS 1 tablet daily and 1/2 tablet on Friday 30  capsule   . LORazepam (ATIVAN) 2 MG tablet Take 1 tablet (2 mg total) by mouth every 8 (eight) hours as needed for anxiety. 40 tablet 0  . MAGNESIUM GLYCINATE PLUS PO Take 100 mg by mouth 2 (two) times daily.    [name] nystatin ointment (MYCOSTATIN)     . thyroid (ARMOUR) 15 MG tablet Take 0.5 tablets (7.5 mg total) by mouth. 7.5 mg qd     No current facility-administered medications for this visit.    Review of Systems Review of Systems  Constitutional: Negative.   Cardiovascular: Negative.   Gastrointestinal: Positive for constipation and rectal pain (bulging and BM sx as noted). Negative for abdominal pain and anal bleeding.  Genitourinary: Positive for frequency. Negative for pelvic pain, vaginal bleeding and vaginal discharge.  Psychiatric/Behavioral: The patient is hyperactive.     Blood pressure 106/65, pulse 67, height 5' 5.5" (1.664 m), weight 170 lb (77.1 kg), last menstrual period 03/29/2004.  Physical Exam Physical  Exam Vitals and nursing note reviewed. Exam conducted with a chaperone present.  Constitutional:      Appearance: Normal appearance.  Pulmonary:     Effort: Pulmonary effort is normal.  Genitourinary:    General: Normal vulva.     Exam position: Lithotomy position.     Urethra: No prolapse.     Comments: Cervix well supported, no pelvic mass,second degree rectocele, no cystourethrocele, no SUI with valsalva Neurological:     General: No focal deficit present.     Mental Status: She is alert.     Data Reviewed Pap 2020 nl  Assessment Rectocele - Plan: Ambulatory referral to Urogynecology, tolterodine (DETROL LA) 4 MG 24 hr capsule  Urge incontinence of urine - Plan: Ambulatory referral to Urogynecology, tolterodine (DETROL LA) 4 MG 24 hr capsule    Plan She will be in [place] for 5 month so I will rx Detrol until she can be seen by urogyn Dr. Donnamarie Rossetti 09/05/2020, 2:54 PM

## 2020-09-05 NOTE — Progress Notes (Signed)
New GYN presents for Uterine Prolapse, pressure/urgency after urination,  she was recently tested and treated for an UTI.

## 2020-09-08 DIAGNOSIS — D2271 Melanocytic nevi of right lower limb, including hip: Secondary | ICD-10-CM | POA: Diagnosis not present

## 2020-09-08 DIAGNOSIS — D2261 Melanocytic nevi of right upper limb, including shoulder: Secondary | ICD-10-CM | POA: Diagnosis not present

## 2020-09-08 DIAGNOSIS — D2272 Melanocytic nevi of left lower limb, including hip: Secondary | ICD-10-CM | POA: Diagnosis not present

## 2020-09-08 DIAGNOSIS — D225 Melanocytic nevi of trunk: Secondary | ICD-10-CM | POA: Diagnosis not present

## 2020-09-08 DIAGNOSIS — D485 Neoplasm of uncertain behavior of skin: Secondary | ICD-10-CM | POA: Diagnosis not present

## 2020-09-08 DIAGNOSIS — Z86008 Personal history of in-situ neoplasm of other site: Secondary | ICD-10-CM | POA: Diagnosis not present

## 2020-09-08 DIAGNOSIS — D2371 Other benign neoplasm of skin of right lower limb, including hip: Secondary | ICD-10-CM | POA: Diagnosis not present

## 2020-09-08 DIAGNOSIS — L989 Disorder of the skin and subcutaneous tissue, unspecified: Secondary | ICD-10-CM | POA: Diagnosis not present

## 2020-09-08 DIAGNOSIS — D2361 Other benign neoplasm of skin of right upper limb, including shoulder: Secondary | ICD-10-CM | POA: Diagnosis not present

## 2020-09-08 DIAGNOSIS — Z86018 Personal history of other benign neoplasm: Secondary | ICD-10-CM | POA: Diagnosis not present

## 2020-09-08 DIAGNOSIS — L578 Other skin changes due to chronic exposure to nonionizing radiation: Secondary | ICD-10-CM | POA: Diagnosis not present

## 2020-09-12 DIAGNOSIS — M542 Cervicalgia: Secondary | ICD-10-CM | POA: Diagnosis not present

## 2020-09-14 ENCOUNTER — Ambulatory Visit (INDEPENDENT_AMBULATORY_CARE_PROVIDER_SITE_OTHER): Payer: Medicare Other | Admitting: Family Medicine

## 2020-09-14 ENCOUNTER — Other Ambulatory Visit: Payer: Self-pay

## 2020-09-14 ENCOUNTER — Encounter: Payer: Self-pay | Admitting: Family Medicine

## 2020-09-14 ENCOUNTER — Telehealth: Payer: Self-pay | Admitting: *Deleted

## 2020-09-14 VITALS — BP 104/62 | HR 63 | Temp 98.1°F | Resp 16 | Wt 171.4 lb

## 2020-09-14 DIAGNOSIS — E782 Mixed hyperlipidemia: Secondary | ICD-10-CM | POA: Diagnosis not present

## 2020-09-14 DIAGNOSIS — M8580 Other specified disorders of bone density and structure, unspecified site: Secondary | ICD-10-CM

## 2020-09-14 DIAGNOSIS — R739 Hyperglycemia, unspecified: Secondary | ICD-10-CM | POA: Diagnosis not present

## 2020-09-14 DIAGNOSIS — F988 Other specified behavioral and emotional disorders with onset usually occurring in childhood and adolescence: Secondary | ICD-10-CM | POA: Diagnosis not present

## 2020-09-14 DIAGNOSIS — E559 Vitamin D deficiency, unspecified: Secondary | ICD-10-CM | POA: Diagnosis not present

## 2020-09-14 DIAGNOSIS — E039 Hypothyroidism, unspecified: Secondary | ICD-10-CM | POA: Diagnosis not present

## 2020-09-14 DIAGNOSIS — U071 COVID-19: Secondary | ICD-10-CM | POA: Diagnosis not present

## 2020-09-14 DIAGNOSIS — Z8616 Personal history of COVID-19: Secondary | ICD-10-CM | POA: Insufficient documentation

## 2020-09-14 DIAGNOSIS — R3915 Urgency of urination: Secondary | ICD-10-CM

## 2020-09-14 DIAGNOSIS — R413 Other amnesia: Secondary | ICD-10-CM

## 2020-09-14 HISTORY — DX: Personal history of COVID-19: Z86.16

## 2020-09-14 LAB — COMPREHENSIVE METABOLIC PANEL
ALT: 15 U/L (ref 0–35)
AST: 22 U/L (ref 0–37)
Albumin: 4.3 g/dL (ref 3.5–5.2)
Alkaline Phosphatase: 77 U/L (ref 39–117)
BUN: 16 mg/dL (ref 6–23)
CO2: 32 mEq/L (ref 19–32)
Calcium: 9.4 mg/dL (ref 8.4–10.5)
Chloride: 104 mEq/L (ref 96–112)
Creatinine, Ser: 0.92 mg/dL (ref 0.40–1.20)
GFR: 63.31 mL/min (ref >60.00)
Glucose, Bld: 88 mg/dL (ref 70–99)
Potassium: 4.8 mEq/L (ref 3.5–5.1)
Sodium: 141 mEq/L (ref 135–145)
Total Bilirubin: 0.7 mg/dL (ref 0.2–1.2)
Total Protein: 6.5 g/dL (ref 6.0–8.3)

## 2020-09-14 LAB — CBC
HCT: 39.3 % (ref 36.0–46.0)
Hemoglobin: 13.3 g/dL (ref 12.0–15.0)
MCHC: 33.9 g/dL (ref 30.0–36.0)
MCV: 97.3 fl (ref 78.0–100.0)
Platelets: 215 10*3/uL (ref 150.0–400.0)
RBC: 4.04 Mil/uL (ref 3.87–5.11)
RDW: 12.5 % (ref 11.5–15.5)
WBC: 5.3 10*3/uL (ref 4.0–10.5)

## 2020-09-14 LAB — LIPID PANEL
Cholesterol: 195 mg/dL (ref 0–200)
HDL: 60.6 mg/dL (ref >39.00)
LDL Cholesterol: 115 mg/dL — ABNORMAL HIGH (ref 0–99)
NonHDL: 134.35
Total CHOL/HDL Ratio: 3
Triglycerides: 97 mg/dL (ref 0.0–149.0)
VLDL: 19.4 mg/dL (ref 0.0–40.0)

## 2020-09-14 LAB — TSH: TSH: 0.41 u[IU]/mL (ref 0.35–4.50)

## 2020-09-14 LAB — HEMOGLOBIN A1C: Hgb A1c MFr Bld: 5.3 % (ref 4.6–6.5)

## 2020-09-14 MED ORDER — DEXTROAMPHETAMINE SULFATE 5 MG PO TABS: 5.0000 mg | ORAL_TABLET | Freq: Every day | ORAL | 0 refills | Status: DC

## 2020-09-14 MED ORDER — SOLIFENACIN SUCCINATE 5 MG PO TABS: 5.0000 mg | ORAL_TABLET | Freq: Every day | ORAL | 2 refills | Status: DC

## 2020-09-14 NOTE — Assessment & Plan Note (Signed)
In January and has recovered well she will get her second booster over the summer before she travels to the [employer]

## 2020-09-14 NOTE — Patient Instructions (Signed)
Consider Myrebetriq or Vesicare for overactive bladder bladder spasm Overactive Bladder, Adult  Overactive bladder is a condition in which a person has a sudden and frequent need to urinate. A person might also leak urine if he or she cannot get to the bathroom fast enough (urinary incontinence). Sometimes, symptoms can interfere with work or social activities. What are the causes? Overactive bladder is associated with poor nerve signals between your bladder and your brain. Your bladder Yazlyn get the signal to empty before it is full. You Darria also have very sensitive muscles that make your bladder squeeze too soon. This condition Chalsea also be caused by other factors, such as:  Medical conditions: ? Urinary tract infection. ? Infection of nearby tissues. ? Prostate enlargement. ? Bladder stones, inflammation, or tumors. ? Diabetes. ? Muscle or nerve weakness, especially from these conditions:  A spinal cord injury.  Stroke.  Multiple sclerosis.  Parkinson's disease.  Other causes: ? Surgery on the uterus or urethra. ? Drinking too much caffeine or alcohol. ? Certain medicines, especially those that eliminate extra fluid in the body (diuretics). ? Constipation. What increases the risk? You Shiryl be at greater risk for overactive bladder if you:  Are an older adult.  Smoke.  Are going through menopause.  Have prostate problems.  Have a neurological disease, such as stroke, dementia, Parkinson's disease, or multiple sclerosis (MS).  Eat or drink alcohol, spicy food, caffeine, and other things that irritate the bladder.  Are overweight or obese. What are the signs or symptoms? Symptoms of this condition include a sudden, strong urge to urinate. Other symptoms include:  Leaking urine.  Urinating 8 or more times a day.  Waking up to urinate 2 or more times overnight. How is this diagnosed? This condition Jaleea be diagnosed based on:  Your symptoms and medical history.  A  physical exam.  Blood or urine tests to check for possible causes, such as infection. You Niema also need to see a health care provider who specializes in urinary tract problems. This is called a urologist. How is this treated? Treatment for overactive bladder depends on the cause of your condition and whether it is mild or severe. Treatment Royale include:  Bladder training, such as: ? Learning to control the urge to urinate by following a schedule to urinate at regular intervals. ? Doing Kegel exercises to strengthen the pelvic floor muscles that support your bladder.  Special devices, such as: ? Biofeedback. This uses sensors to help you become aware of your body's signals. ? Electrical stimulation. This uses electrodes placed inside the body (implanted) or outside the body. These electrodes send gentle pulses of electricity to strengthen the nerves or muscles that control the bladder. ? Women Sonia use a plastic device, called a pessary, that fits into the vagina and supports the bladder.  Medicines, such as: ? Antibiotics to treat bladder infection. ? Antispasmodics to stop the bladder from releasing urine at the wrong time. ? Tricyclic antidepressants to relax bladder muscles. ? Injections of botulinum toxin type A directly into the bladder tissue to relax bladder muscles.  Surgery, such as: ? A device Latacha be implanted to help manage the nerve signals that control urination. ? An electrode Tram be implanted to stimulate electrical signals in the bladder. ? A procedure Aleanna be done to change the shape of the bladder. This is done only in very severe cases. Follow these instructions at home: Eating and drinking  Make diet or lifestyle changes recommended by your  health care provider. These Shaunita include: ? Drinking fluids throughout the day and not only with meals. ? Cutting down on caffeine or alcohol. ? Eating a healthy and balanced diet to prevent constipation. This Aleksa  include:  Choosing foods that are high in fiber, such as beans, whole grains, and fresh fruits and vegetables.  Limiting foods that are high in fat and processed sugars, such as fried and sweet foods.   Lifestyle  Lose weight if needed.  Do not use any products that contain nicotine or tobacco. These include cigarettes, chewing tobacco, and vaping devices, such as e-cigarettes. If you need help quitting, ask your health care provider.   General instructions  Take over-the-counter and prescription medicines only as told by your health care provider.  If you were prescribed an antibiotic medicine, take it as told by your health care provider. Do not stop taking the antibiotic even if you start to feel better.  Use any implants or pessary as told by your health care provider.  If needed, wear pads to absorb urine leakage.  Keep a log to track how much and when you drink, and when you need to urinate. This will help your health care provider monitor your condition.  Keep all follow-up visits. This is important. Contact a health care provider if:  You have a fever or chills.  Your symptoms do not get better with treatment.  Your pain and discomfort get worse.  You have more frequent urges to urinate. Get help right away if:  You are not able to control your bladder. Summary  Overactive bladder refers to a condition in which a person has a sudden and frequent need to urinate.  Several conditions Zoriyah lead to an overactive bladder.  Treatment for overactive bladder depends on the cause and severity of your condition.  Making lifestyle changes, doing Kegel exercises, keeping a log, and taking medicines can help with this condition. This information is not intended to replace advice given to you by your health care provider. Make sure you discuss any questions you have with your health care provider. Document Revised: 01/03/2020 Document Reviewed: 01/03/2020 Elsevier Patient  Education  2021 [employer].

## 2020-09-14 NOTE — Assessment & Plan Note (Signed)
Encouraged to get adequate exercise, calcium and vitamin d intake 

## 2020-09-14 NOTE — Assessment & Plan Note (Signed)
hgba1c acceptable, minimize simple carbs. Increase exercise as tolerated.  

## 2020-09-14 NOTE — Progress Notes (Signed)
Patient ID: Shannon [name], female    DOB: 20-Jan-1951  Age: 70 y.o. MRN: [id number]    Subjective:  Subjective  HPI Shannon [name] presents for office visit today for follow up on bloodworks. She states that she is doing well. She reports that recently she has had a UTI accompanied by symptoms of   bladder spasms with urgency that gets worse when urinating. She reports experiencing overflowing urinary incontinence and constipation, despite endorsing hydration and eating fibers. She denies any chest pain, SOB, fever, abdominal pain, cough, chills, sore throat, dysuria, back pain, HA, or N/VD. She reports that she has been dx with adult ADHD, but has stopped receiving treatment by medication for a while and she is experiencing interest in going back on medication. She reports that recently when she is laying down she experiences dizziness.   Review of Systems  Constitutional: Negative for chills, fatigue and fever.  HENT: Negative for congestion, rhinorrhea, sinus pressure, sinus pain and sore throat.   Eyes: Negative for pain and visual disturbance.  Respiratory: Negative for cough and shortness of breath.   Cardiovascular: Negative for chest pain, palpitations and leg swelling.  Gastrointestinal: Positive for constipation. Negative for abdominal pain, blood in stool, diarrhea, nausea and vomiting.  Genitourinary: Positive for urgency. Negative for decreased urine volume, dysuria, flank pain, frequency, vaginal bleeding and vaginal discharge.  Musculoskeletal: Negative for back pain.  Neurological: Negative for headaches.    History Past Medical History:  Diagnosis Date  . ADD (attention deficit disorder with hyperactivity)   . Anxiety   . Asthma   . Cancer ([place])    melanoma- lip 2011  . Depression   . Diverticulosis   . Gestational diabetes   . Hyperlipidemia, mixed 03/11/2016  . Hypothyroidism   . Menopause   . Osteopenia   . Rosacea   . Sciatica of left side 02/24/2017  . Travel  advice encounter 03/25/2016    She has a past surgical history that includes Tonsillectomy and adenoidectomy; Dilation and curettage of uterus; blephoroplasty; Brow lift; and Skin cancer excision.   Her family history includes ADD / ADHD in her daughter and son; Alcohol abuse in her sister; Anxiety disorder in her brother, daughter, and son; Cancer in her sister and sister; Colon cancer in her maternal grandfather; Colon polyps in her mother; Depression in her daughter and son; Diabetes in her father; Emphysema in her father; Parkinson's disease in her mother; Tuberculosis in her father.She reports that she has never smoked. She has never used smokeless tobacco. She reports current alcohol use of about 3.0 - 4.0 standard drinks of alcohol per week. She reports that she does not use drugs.  Current Outpatient Medications on File Prior to Visit  Medication Sig Dispense Refill  . Cholecalciferol (VITAMIN D) 125 MCG (5000 UT) CAPS Take by mouth 2 (two) times daily.    [name] escitalopram (LEXAPRO) 5 MG tablet Take 1 tablet (5 mg total) by mouth daily. 90 tablet 2  . gabapentin (NEURONTIN) 300 MG capsule TAKE (1) CAPSULE THREE TIMES DAILY. 90 capsule 0  . GAMMA AMINOBUTYRIC ACID PO Take 200 mg by mouth as needed. Up to bid    . hydrocortisone (PROCTOZONE-HC) 2.5 % rectal cream Place 1 application rectally 2 (two) times daily. 30 g 0  . Krill Oil 1000 MG CAPS     . Levothyroxine Sodium 100 MCG CAPS 1 tablet daily and 1/2 tablet on Friday 30 capsule   . LORazepam (ATIVAN) 2 MG tablet Take  1 tablet (2 mg total) by mouth every 8 (eight) hours as needed for anxiety. 40 tablet 0  . MAGNESIUM GLYCINATE PLUS PO Take 100 mg by mouth 2 (two) times daily.    [name] nystatin ointment (MYCOSTATIN)     . thyroid (ARMOUR) 15 MG tablet Take 0.5 tablets (7.5 mg total) by mouth. 7.5 mg qd    . tolterodine (DETROL LA) 4 MG 24 hr capsule Take 1 capsule (4 mg total) by mouth daily. (Patient not taking: Reported on 09/14/2020) 30  capsule 4   No current facility-administered medications on file prior to visit.     Objective:  Objective  Physical Exam Constitutional:      General: She is not in acute distress.    Appearance: Normal appearance. She is not ill-appearing or toxic-appearing.  HENT:     Head: Normocephalic and atraumatic.     Right Ear: Tympanic membrane, ear canal and external ear normal.     Left Ear: Tympanic membrane, ear canal and external ear normal.     Nose: No congestion or rhinorrhea.  Eyes:     Extraocular Movements: Extraocular movements intact.     Pupils: Pupils are equal, round, and reactive to light.  Cardiovascular:     Rate and Rhythm: Normal rate and regular rhythm.     Pulses: Normal pulses.     Heart sounds: Normal heart sounds. No murmur heard.   Pulmonary:     Effort: Pulmonary effort is normal. No respiratory distress.     Breath sounds: Normal breath sounds. No wheezing, rhonchi or rales.  Abdominal:     General: Bowel sounds are normal.     Palpations: Abdomen is soft. There is no mass.     Tenderness: There is no abdominal tenderness. There is no guarding.     Hernia: No hernia is present.  Musculoskeletal:        General: Normal range of motion.     Cervical back: Normal range of motion and neck supple.  Skin:    General: Skin is warm and dry.  Neurological:     Mental Status: She is alert and oriented to person, place, and time.  Psychiatric:        Behavior: Behavior normal.    BP 104/62   Pulse 63   Temp 98.1 F (36.7 C)   Resp 16   Wt 171 lb 6.4 oz (77.7 kg)   LMP 03/29/2004   SpO2 98%   BMI 28.09 kg/m  Wt Readings from Last 3 Encounters:  09/14/20 171 lb 6.4 oz (77.7 kg)  09/05/20 170 lb (77.1 kg)  08/16/20 172 lb (78 kg)     Lab Results  Component Value Date   WBC 5.3 09/14/2020   HGB 13.3 09/14/2020   HCT 39.3 09/14/2020   PLT 215.0 09/14/2020   GLUCOSE 88 09/14/2020   CHOL 195 09/14/2020   TRIG 97.0 09/14/2020   HDL 60.60  09/14/2020   LDLCALC 115 (H) 09/14/2020   ALT 15 09/14/2020   AST 22 09/14/2020   NA 141 09/14/2020   K 4.8 09/14/2020   CL 104 09/14/2020   CREATININE 0.92 09/14/2020   BUN 16 09/14/2020   CO2 32 09/14/2020   TSH 0.41 09/14/2020   HGBA1C 5.3 09/14/2020   MICROALBUR 0.2 02/28/2014    DG Chest 2 View  Result Date: 04/11/2020 CLINICAL DATA:  Cough since July. EXAM: CHEST - 2 VIEW COMPARISON:  Two-view chest x-ray 09/02/2010 FINDINGS: The heart size is normal.  Subtle linear densities at both lung bases likely reflects atelectasis. No significant airspace consolidation is present. No edema or effusion is present. 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 IMPRESSION: 1. Subtle bibasilar atelectasis. 2. No other acute cardiopulmonary disease. Electronically Signed   By: San Morelle M.D.   On: 04/11/2020 15:33     Assessment & Plan:  Plan    Meds ordered this encounter  Medications  . solifenacin (VESICARE) 5 MG tablet    Sig: Take 1 tablet (5 mg total) by mouth daily.    Dispense:  30 tablet    Refill:  2  . dextroamphetamine (DEXTROSTAT) 5 MG tablet    Sig: Take 1 tablet (5 mg total) by mouth daily.    Dispense:  30 tablet    Refill:  0    Problem List Items Addressed This Visit    Hypothyroidism    On Levothyroxine, continue to monitor      Relevant Orders   TSH (Completed)   Vitamin D deficiency    Supplement and monitor      Hyperlipidemia, mixed    Encouraged heart healthy diet, increase exercise, avoid trans fats, consider a krill oil cap daily      Relevant Orders   Lipid panel (Completed)   Hyperglycemia    hgba1c acceptable, minimize simple carbs. Increase exercise as tolerated      Relevant Orders   Comprehensive metabolic panel (Completed)   Hemoglobin A1c (Completed)   Osteopenia    Encouraged to get adequate exercise, calcium and vitamin d intake      COVID-19    In January and has recovered well she will  get her second booster over the summer before she travels to the [place]      ADD (attention deficit disorder)    She has carried the diagnosis for years and was previously treated with Dextrostat 5 mg daily prn with good results. She is referred to Neurology for evaluation and the medication is refilled. She is aware of the risk and is choosing to proceed.       Relevant Orders   Ambulatory referral to [place]   Ambulatory referral to Neurology   Urinary urgency - Primary    She has seen by urology and she has an appointment with urogynecology soon after being referred her to [facility]. She is traveling soon so we will give her a trial of Vesicare 5 mg to see if that helps.       Relevant Orders   CBC (Completed)   Memory deficit    She still manages her ADLs well but notes she is having more trouble with Short Term Memory. She is referred to neurology for further evaluation.       Relevant Orders   Ambulatory referral to [facility]   Ambulatory referral to Neurology      Follow-up: Return 6 wk VV f/u.    I,David Hanna,acting as a scribe for Penni Homans, MD.,have documented all relevant documentation on the behalf of Penni Homans, MD,as directed by  Penni Homans, MD while in the presence of Penni Homans, MD.  I, Mosie Lukes, MD personally performed the services described in this documentation. All medical record entries made by the scribe were at my direction and in my presence. I have reviewed the chart and agree that the record reflects my personal performance and is accurate and complete

## 2020-09-14 NOTE — Assessment & Plan Note (Signed)
Supplement and monitor 

## 2020-09-14 NOTE — Telephone Encounter (Signed)
Prior auth done for Vesicare via cover my meds Key:  [id number].  PA Case: [id number], Status: Approved, Coverage Starts on: 09/14/2020 12:00:00 AM, Coverage Ends on: 04/28/2021 12:00:00 AM

## 2020-09-16 ENCOUNTER — Other Ambulatory Visit: Payer: Self-pay | Admitting: Family Medicine

## 2020-09-16 DIAGNOSIS — F988 Other specified behavioral and emotional disorders with onset usually occurring in childhood and adolescence: Secondary | ICD-10-CM

## 2020-09-16 DIAGNOSIS — R413 Other amnesia: Secondary | ICD-10-CM

## 2020-09-17 DIAGNOSIS — F900 Attention-deficit hyperactivity disorder, predominantly inattentive type: Secondary | ICD-10-CM | POA: Insufficient documentation

## 2020-09-17 DIAGNOSIS — F988 Other specified behavioral and emotional disorders with onset usually occurring in childhood and adolescence: Secondary | ICD-10-CM | POA: Insufficient documentation

## 2020-09-17 DIAGNOSIS — R3915 Urgency of urination: Secondary | ICD-10-CM | POA: Insufficient documentation

## 2020-09-17 DIAGNOSIS — R413 Other amnesia: Secondary | ICD-10-CM | POA: Insufficient documentation

## 2020-09-17 DIAGNOSIS — R32 Unspecified urinary incontinence: Secondary | ICD-10-CM | POA: Insufficient documentation

## 2020-09-17 HISTORY — DX: Attention-deficit hyperactivity disorder, predominantly inattentive type: F90.0

## 2020-09-17 HISTORY — DX: Urgency of urination: R39.15

## 2020-09-17 NOTE — Assessment & Plan Note (Signed)
On Levothyroxine, continue to monitor 

## 2020-09-17 NOTE — Assessment & Plan Note (Signed)
She has seen by urology and she has an appointment with urogynecology soon after being referred her to [facility]. She is traveling soon so we will give her a trial of Vesicare 5 mg to see if that helps.

## 2020-09-17 NOTE — Assessment & Plan Note (Signed)
Encouraged heart healthy diet, increase exercise, avoid trans fats, consider a krill oil cap daily 

## 2020-09-17 NOTE — Assessment & Plan Note (Signed)
She has carried the diagnosis for years and was previously treated with Dextrostat 5 mg daily prn with good results. She is referred to Neurology for evaluation and the medication is refilled. She is aware of the risk and is choosing to proceed.

## 2020-09-17 NOTE — Assessment & Plan Note (Signed)
She still manages her ADLs well but notes she is having more trouble with Short Term Memory. She is referred to neurology for further evaluation.

## 2020-09-18 ENCOUNTER — Other Ambulatory Visit: Payer: Self-pay | Admitting: Family Medicine

## 2020-09-18 MED ORDER — LORAZEPAM 2 MG PO TABS: 2.0000 mg | ORAL_TABLET | Freq: Three times a day (TID) | ORAL | 0 refills | Status: DC | PRN

## 2020-09-18 NOTE — Telephone Encounter (Signed)
Patient is requesting a refill of the following medications: Requested Prescriptions   Pending Prescriptions Disp Refills   LORazepam (ATIVAN) 2 MG tablet 40 tablet 0    Sig: Take 1 tablet (2 mg total) by mouth every 8 (eight) hours as needed for anxiety.    Date of patient request: 09/18/20 Last office visit: 09/14/20 Date of last refill: 04/08/19 Last refill amount: 40 + 0 Follow up time period per chart: 04/12/21

## 2020-10-02 DIAGNOSIS — E559 Vitamin D deficiency, unspecified: Secondary | ICD-10-CM | POA: Diagnosis not present

## 2020-10-02 DIAGNOSIS — E349 Endocrine disorder, unspecified: Secondary | ICD-10-CM | POA: Diagnosis not present

## 2020-10-02 DIAGNOSIS — E039 Hypothyroidism, unspecified: Secondary | ICD-10-CM | POA: Diagnosis not present

## 2020-10-31 DIAGNOSIS — Z23 Encounter for immunization: Secondary | ICD-10-CM | POA: Diagnosis not present

## 2020-11-02 ENCOUNTER — Telehealth (INDEPENDENT_AMBULATORY_CARE_PROVIDER_SITE_OTHER): Payer: Medicare Other | Admitting: Family Medicine

## 2020-11-02 ENCOUNTER — Other Ambulatory Visit: Payer: Self-pay

## 2020-11-02 DIAGNOSIS — E782 Mixed hyperlipidemia: Secondary | ICD-10-CM

## 2020-11-02 DIAGNOSIS — E039 Hypothyroidism, unspecified: Secondary | ICD-10-CM

## 2020-11-02 DIAGNOSIS — D2272 Melanocytic nevi of left lower limb, including hip: Secondary | ICD-10-CM | POA: Diagnosis not present

## 2020-11-02 DIAGNOSIS — D2261 Melanocytic nevi of right upper limb, including shoulder: Secondary | ICD-10-CM | POA: Diagnosis not present

## 2020-11-02 DIAGNOSIS — F988 Other specified behavioral and emotional disorders with onset usually occurring in childhood and adolescence: Secondary | ICD-10-CM | POA: Diagnosis not present

## 2020-11-02 DIAGNOSIS — R739 Hyperglycemia, unspecified: Secondary | ICD-10-CM

## 2020-11-02 DIAGNOSIS — E559 Vitamin D deficiency, unspecified: Secondary | ICD-10-CM

## 2020-11-02 DIAGNOSIS — R350 Frequency of micturition: Secondary | ICD-10-CM | POA: Diagnosis not present

## 2020-11-02 MED ORDER — SOLIFENACIN SUCCINATE 5 MG PO TABS: 5.0000 mg | ORAL_TABLET | Freq: Every day | ORAL | 1 refills | Status: DC

## 2020-11-02 MED ORDER — DEXTROAMPHETAMINE SULFATE 10 MG PO TABS: 10.0000 mg | ORAL_TABLET | Freq: Every day | ORAL | 0 refills | Status: DC

## 2020-11-05 NOTE — Assessment & Plan Note (Signed)
On Levothyroxine, continue to monitor 

## 2020-11-05 NOTE — Assessment & Plan Note (Signed)
hgba1c acceptable, minimize simple carbs. Increase exercise as tolerated.  

## 2020-11-05 NOTE — Assessment & Plan Note (Signed)
Good response to medications. She has much more control of her bladder at this time.

## 2020-11-05 NOTE — Assessment & Plan Note (Signed)
Encourage heart healthy diet such as MIND or DASH diet, increase exercise, avoid trans fats, simple carbohydrates and processed foods, consider a krill or fish or flaxseed oil cap daily.  °

## 2020-11-05 NOTE — Assessment & Plan Note (Signed)
Supplement and monitor 

## 2020-11-05 NOTE — Assessment & Plan Note (Signed)
She notes Dextrostat 5 mg daily has helped without side effect but has not helped enough so we will try to increase to 10 mg daily

## 2020-11-05 NOTE — Progress Notes (Signed)
MyChart Video Visit    Virtual Visit via Video Note   This visit type was conducted due to national recommendations for restrictions regarding the COVID-19 Pandemic (e.g. social distancing) in an effort to limit this patient's exposure and mitigate transmission in our community. This patient is at least at moderate risk for complications without adequate follow up. This format is felt to be most appropriate for this patient at this time. Physical exam was limited by quality of the video and audio technology used for the visit. S Chism, [name] was able to get the patient set up on a video visit.  Patient location: Home Patient and provider in visit Provider location: Office  I discussed the limitations of evaluation and management by telemedicine and the availability of in person appointments. The patient expressed understanding and agreed to proceed.  Visit Date: 11/02/2020  Today's healthcare provider: Penni Homans, MD     Subjective:    Patient ID: Shannon [name], female    DOB: 04/25/1951, 70 y.o.   MRN: [id number]  Chief Complaint  Patient presents with   Follow-up   Hyperlipidemia    HPI Patient is in today for follow up on medication changes. She notes a significant improvement in her urinary urgency and frequency. No dysuria or hematuria. No recent febrile illness or hospitalizations. She denies any acute concerns. Denies CP/palp/SOB/HA/congestion/fevers/GI or GU c/o. Taking meds as prescribed   Past Medical History:  Diagnosis Date   ADD (attention deficit disorder with hyperactivity)    Anxiety    Asthma    Cancer ([place])    melanoma- lip 2011   Depression    Diverticulosis    Gestational diabetes    Hyperlipidemia, mixed 03/11/2016   Hypothyroidism    Menopause    Osteopenia    Rosacea    Sciatica of left side 02/24/2017   Travel advice encounter 03/25/2016    Past Surgical History:  Procedure Laterality Date   blephoroplasty     BROW LIFT     DILATION AND  CURETTAGE OF UTERUS     SKIN CANCER EXCISION     right lower lip   TONSILLECTOMY AND ADENOIDECTOMY      Family History  Problem Relation Age of Onset   Colon polyps Mother    Parkinson's disease Mother    Diabetes Father    Emphysema Father    Tuberculosis Father    Colon cancer Maternal Grandfather    Alcohol abuse Sister    Cancer Sister        skin cancer, melanoma cell, basil cell   ADD / ADHD Daughter    Anxiety disorder Daughter    Depression Daughter    ADD / ADHD Son    Anxiety disorder Son    Depression Son    Cancer Sister        melanoma   Anxiety disorder Brother     Social History   Socioeconomic History   Marital status: Single    Spouse name: Not on file   Number of children: 2   Years of education: Not on file   Highest education level: Not on file  Occupational History   Occupation: Retired Tourist information centre manager  Tobacco Use   Smoking status: Never   Smokeless tobacco: Never  Vaping Use   Vaping Use: Never used  Substance and Sexual Activity   Alcohol use: Yes    Alcohol/week: 3.0 - 4.0 standard drinks    Types: 1 - 2 Glasses of wine, 1  Cans of beer, 1 Shots of liquor per week    Comment: not every night.   Drug use: No   Sexual activity: Not Currently  Other Topics Concern   Not on file  Social History Narrative   Retired from [employer].   No major dietary restrictions except avoid gluten.       Social Determinants of Health   Financial Resource Strain: Low Risk    Difficulty of Paying Living Expenses: Not hard at all  Food Insecurity: No Food Insecurity   Worried About Charity fundraiser in the Last Year: Never true   [place] in the Last Year: Never true  Transportation Needs: No Transportation Needs   Lack of Transportation (Medical): No   Lack of Transportation (Non-Medical): No  Physical Activity: Insufficiently Active   Days of Exercise per Week: 2 days   Minutes of Exercise per Session: 50 min  Stress: No Stress  Concern Present   Feeling of Stress : Not at all  Social Connections: Moderately Integrated   Frequency of Communication with Friends and Family: More than three times a week   Frequency of Social Gatherings with Friends and Family: More than three times a week   Attends Religious Services: More than 4 times per year   Active Member of [employer] or Organizations: Yes   Attends Music therapist: More than 4 times per year   Marital Status: Divorced  Human resources officer Violence: Not At Risk   Fear of Current or Ex-Partner: No   Emotionally Abused: No   Physically Abused: No   Sexually Abused: No    Outpatient Medications Prior to Visit  Medication Sig Dispense Refill   Cholecalciferol (VITAMIN D) 125 MCG (5000 UT) CAPS Take by mouth 2 (two) times daily.     escitalopram (LEXAPRO) 5 MG tablet Take 1 tablet (5 mg total) by mouth daily. 90 tablet 2   gabapentin (NEURONTIN) 300 MG capsule TAKE (1) CAPSULE THREE TIMES DAILY. 90 capsule 0   GAMMA AMINOBUTYRIC ACID PO Take 200 mg by mouth as needed. Up to bid     hydrocortisone (PROCTOZONE-HC) 2.5 % rectal cream Place 1 application rectally 2 (two) times daily. 30 g 0   Krill Oil 1000 MG CAPS      Levothyroxine Sodium 100 MCG CAPS 1 tablet daily and 1/2 tablet on Friday 30 capsule    LORazepam (ATIVAN) 2 MG tablet Take 1 tablet (2 mg total) by mouth every 8 (eight) hours as needed for anxiety. 40 tablet 0   MAGNESIUM GLYCINATE PLUS PO Take 100 mg by mouth 2 (two) times daily.     nystatin ointment (MYCOSTATIN)      thyroid (ARMOUR) 15 MG tablet Take 0.5 tablets (7.5 mg total) by mouth. 7.5 mg qd     dextroamphetamine (DEXTROSTAT) 5 MG tablet Take 1 tablet (5 mg total) by mouth daily. 30 tablet 0   solifenacin (VESICARE) 5 MG tablet Take 1 tablet (5 mg total) by mouth daily. 30 tablet 2   tolterodine (DETROL LA) 4 MG 24 hr capsule Take 1 capsule (4 mg total) by mouth daily. (Patient not taking: Reported on 09/14/2020) 30 capsule 4   No  facility-administered medications prior to visit.    Allergies  Allergen Reactions   Hydrocodone-Acetaminophen Nausea And Vomiting   Gluten Meal Other (See Comments)    GI symptoms; has BM everytime she urinates GI symptoms; has BM everytime she urinates   Oxycodone Nausea And Vomiting  Review of Systems  Constitutional:  Negative for fever and malaise/fatigue.  HENT:  Negative for congestion.   Eyes:  Negative for blurred vision.  Respiratory:  Negative for shortness of breath.   Cardiovascular:  Negative for chest pain, palpitations and leg swelling.  Gastrointestinal:  Negative for abdominal pain, blood in stool and nausea.  Genitourinary:  Negative for dysuria and frequency.  Musculoskeletal:  Negative for falls.  Skin:  Negative for rash.  Neurological:  Negative for dizziness, loss of consciousness and headaches.  Endo/Heme/Allergies:  Negative for environmental allergies.  Psychiatric/Behavioral:  Negative for depression. The patient is not nervous/anxious.       Objective:    Physical Exam Constitutional:      General: She is not in acute distress.    Appearance: Normal appearance. She is not ill-appearing or toxic-appearing.  HENT:     Head: Normocephalic and atraumatic.     Right Ear: External ear normal.     Left Ear: External ear normal.     Nose: Nose normal.  Eyes:     General:        Right eye: No discharge.        Left eye: No discharge.  Pulmonary:     Effort: Pulmonary effort is normal.  Skin:    Findings: No rash.  Neurological:     Mental Status: She is alert and oriented to person, place, and time.  Psychiatric:        Behavior: Behavior normal.    LMP 03/29/2004  Wt Readings from Last 3 Encounters:  09/14/20 171 lb 6.4 oz (77.7 kg)  09/05/20 170 lb (77.1 kg)  08/16/20 172 lb (78 kg)    Diabetic Foot Exam - Simple   No data filed    Lab Results  Component Value Date   WBC 5.3 09/14/2020   HGB 13.3 09/14/2020   HCT 39.3  09/14/2020   PLT 215.0 09/14/2020   GLUCOSE 88 09/14/2020   CHOL 195 09/14/2020   TRIG 97.0 09/14/2020   HDL 60.60 09/14/2020   LDLCALC 115 (H) 09/14/2020   ALT 15 09/14/2020   AST 22 09/14/2020   NA 141 09/14/2020   K 4.8 09/14/2020   CL 104 09/14/2020   CREATININE 0.92 09/14/2020   BUN 16 09/14/2020   CO2 32 09/14/2020   TSH 0.41 09/14/2020   HGBA1C 5.3 09/14/2020   MICROALBUR 0.2 02/28/2014    Lab Results  Component Value Date   TSH 0.41 09/14/2020   Lab Results  Component Value Date   WBC 5.3 09/14/2020   HGB 13.3 09/14/2020   HCT 39.3 09/14/2020   MCV 97.3 09/14/2020   PLT 215.0 09/14/2020   Lab Results  Component Value Date   NA 141 09/14/2020   K 4.8 09/14/2020   CO2 32 09/14/2020   GLUCOSE 88 09/14/2020   BUN 16 09/14/2020   CREATININE 0.92 09/14/2020   BILITOT 0.7 09/14/2020   ALKPHOS 77 09/14/2020   AST 22 09/14/2020   ALT 15 09/14/2020   PROT 6.5 09/14/2020   ALBUMIN 4.3 09/14/2020   CALCIUM 9.4 09/14/2020   GFR 63.31 09/14/2020   Lab Results  Component Value Date   CHOL 195 09/14/2020   Lab Results  Component Value Date   HDL 60.60 09/14/2020   Lab Results  Component Value Date   LDLCALC 115 (H) 09/14/2020   Lab Results  Component Value Date   TRIG 97.0 09/14/2020   Lab Results  Component Value Date  CHOLHDL 3 09/14/2020   Lab Results  Component Value Date   HGBA1C 5.3 09/14/2020       Assessment & Plan:   Problem List Items Addressed This Visit     Hypothyroidism - Primary    On Levothyroxine, continue to monitor       Relevant Orders   Ambulatory referral to Endocrinology   Vitamin D deficiency    Supplement and monitor       Hyperlipidemia, mixed    Encourage heart healthy diet such as MIND or DASH diet, increase exercise, avoid trans fats, simple carbohydrates and processed foods, consider a krill or fish or flaxseed oil cap daily.        Urinary frequency    Good response to medications. She has much  more control of her bladder at this time.        Hyperglycemia    hgba1c acceptable, minimize simple carbs. Increase exercise as tolerated.        I have discontinued Mila H. Heiden's dextroamphetamine. I am also having her start on dextroamphetamine. Additionally, I am having her maintain her Vitamin D, GAMMA AMINOBUTYRIC ACID PO, MAGNESIUM GLYCINATE PLUS PO, hydrocortisone, Krill Oil, gabapentin, nystatin ointment, Levothyroxine Sodium, thyroid, escitalopram, tolterodine, LORazepam, and solifenacin.  Meds ordered this encounter  Medications   dextroamphetamine (DEXTROSTAT) 10 MG tablet    Sig: Take 1 tablet (10 mg total) by mouth daily.    Dispense:  30 tablet    Refill:  0   solifenacin (VESICARE) 5 MG tablet    Sig: Take 1 tablet (5 mg total) by mouth daily.    Dispense:  90 tablet    Refill:  1    I discussed the assessment and treatment plan with the patient. The patient was provided an opportunity to ask questions and all were answered. The patient agreed with the plan and demonstrated an understanding of the instructions.   The patient was advised to call back or seek an in-person evaluation if the symptoms worsen or if the condition fails to improve as anticipated.  I provided 21 minutes of face-to-face time during this encounter.   Penni Homans, MD [facility] at [facility] [phone] (phone) [phone] (fax)  [place]

## 2020-11-09 ENCOUNTER — Encounter: Payer: Self-pay | Admitting: Family Medicine

## 2020-11-15 ENCOUNTER — Encounter: Payer: Self-pay | Admitting: Family Medicine

## 2020-11-17 ENCOUNTER — Other Ambulatory Visit: Payer: Self-pay | Admitting: Family Medicine

## 2020-11-17 MED ORDER — SCOPOLAMINE 1 MG/3DAYS TD PT72: 1.0000 | MEDICATED_PATCH | TRANSDERMAL | 0 refills | Status: DC

## 2020-11-19 ENCOUNTER — Encounter: Payer: Self-pay | Admitting: Family Medicine

## 2020-11-20 ENCOUNTER — Other Ambulatory Visit: Payer: Self-pay | Admitting: Family Medicine

## 2020-11-20 MED ORDER — MOLNUPIRAVIR EUA 200MG CAPSULE: 4.0000 | ORAL_CAPSULE | Freq: Two times a day (BID) | ORAL | 0 refills | Status: AC

## 2020-11-21 NOTE — Telephone Encounter (Signed)
Shannon [name] with [facility] in [place], [place] called and stated the they cannot fill the antiviral medication for the patient because she does not have Covid and the patient only wanted it for travel reasons.

## 2020-12-19 ENCOUNTER — Telehealth (INDEPENDENT_AMBULATORY_CARE_PROVIDER_SITE_OTHER): Payer: Medicare Other | Admitting: Family Medicine

## 2020-12-19 ENCOUNTER — Other Ambulatory Visit: Payer: Self-pay

## 2020-12-19 DIAGNOSIS — R739 Hyperglycemia, unspecified: Secondary | ICD-10-CM

## 2020-12-19 DIAGNOSIS — F4323 Adjustment disorder with mixed anxiety and depressed mood: Secondary | ICD-10-CM | POA: Diagnosis not present

## 2020-12-19 DIAGNOSIS — F988 Other specified behavioral and emotional disorders with onset usually occurring in childhood and adolescence: Secondary | ICD-10-CM | POA: Diagnosis not present

## 2020-12-19 MED ORDER — DEXTROAMPHETAMINE SULFATE 10 MG PO TABS: 10.0000 mg | ORAL_TABLET | Freq: Every day | ORAL | 0 refills | Status: DC

## 2020-12-19 MED ORDER — ESCITALOPRAM OXALATE 10 MG PO TABS: 10.0000 mg | ORAL_TABLET | Freq: Every day | ORAL | 1 refills | Status: DC

## 2020-12-19 NOTE — Progress Notes (Signed)
MyChart Video Visit    Virtual Visit via Video Note   This visit type was conducted due to national recommendations for restrictions regarding the COVID-19 Pandemic (e.g. social distancing) in an effort to limit this patient's exposure and mitigate transmission in our community. This patient is at least at moderate risk for complications without adequate follow up. This format is felt to be most appropriate for this patient at this time. Physical exam was limited by quality of the video and audio technology used for the visit. S Chism, [name] was able to get the patient set up on a video visit.  Patient location: home Patient and provider in visit Provider location: Office  I discussed the limitations of evaluation and management by telemedicine and the availability of in person appointments. The patient expressed understanding and agreed to proceed.  Visit Date: 12/19/2020  Today's healthcare provider: Penni Homans, MD     Subjective:    Patient ID: Shannon [name], female    DOB: 1950/05/16, 70 y.o.   MRN: [id number]  Chief Complaint  Patient presents with   Follow-up    HPI  Patient is in today for video visit for follow up on hypothyroidism and ADD medication management. She states that Dextrostat 10 mg does not help with organization, but she feels better while on it and denies any symptoms of HA's/CP/SOB/GI/GU or palpitations. She did not get a chance to measure her BP since last visit, however her latest pulse today was 63 bpm while sitting in the car. Denies any rashes. She expresses interest in increasing her lexapro dosage as decreasing to 5 mg worsened her symptoms. She expresses concern regarding memory loss issues and denies any HA's.   Past Medical History:  Diagnosis Date   ADD (attention deficit disorder with hyperactivity)    Anxiety    Asthma    Cancer ([place])    melanoma- lip 2011   Depression    Diverticulosis    Gestational diabetes    Hyperlipidemia, mixed  03/11/2016   Hypothyroidism    Menopause    Osteopenia    Rosacea    Sciatica of left side 02/24/2017   Travel advice encounter 03/25/2016    Past Surgical History:  Procedure Laterality Date   blephoroplasty     BROW LIFT     DILATION AND CURETTAGE OF UTERUS     SKIN CANCER EXCISION     right lower lip   TONSILLECTOMY AND ADENOIDECTOMY      Family History  Problem Relation Age of Onset   Colon polyps Mother    Parkinson's disease Mother    Diabetes Father    Emphysema Father    Tuberculosis Father    Colon cancer Maternal Grandfather    Alcohol abuse Sister    Cancer Sister        skin cancer, melanoma cell, basil cell   ADD / ADHD Daughter    Anxiety disorder Daughter    Depression Daughter    ADD / ADHD Son    Anxiety disorder Son    Depression Son    Cancer Sister        melanoma   Anxiety disorder Brother     Social History   Socioeconomic History   Marital status: Single    Spouse name: Not on file   Number of children: 2   Years of education: Not on file   Highest education level: Not on file  Occupational History   Occupation: Retired Tourist information centre manager  Tobacco Use   Smoking status: Never   Smokeless tobacco: Never  Vaping Use   Vaping Use: Never used  Substance and Sexual Activity   Alcohol use: Yes    Alcohol/week: 3.0 - 4.0 standard drinks    Types: 1 - 2 Glasses of wine, 1 Cans of beer, 1 Shots of liquor per week    Comment: not every night.   Drug use: No   Sexual activity: Not Currently  Other Topics Concern   Not on file  Social History Narrative   Retired from [employer].   No major dietary restrictions except avoid gluten.       Social Determinants of Health   Financial Resource Strain: Low Risk    Difficulty of Paying Living Expenses: Not hard at all  Food Insecurity: No Food Insecurity   Worried About Charity fundraiser in the Last Year: Never true   [place] in the Last Year: Never true  Transportation Needs: No  Transportation Needs   Lack of Transportation (Medical): No   Lack of Transportation (Non-Medical): No  Physical Activity: Insufficiently Active   Days of Exercise per Week: 2 days   Minutes of Exercise per Session: 50 min  Stress: No Stress Concern Present   Feeling of Stress : Not at all  Social Connections: Moderately Integrated   Frequency of Communication with Friends and Family: More than three times a week   Frequency of Social Gatherings with Friends and Family: More than three times a week   Attends Religious Services: More than 4 times per year   Active Member of [employer] or Organizations: Yes   Attends Music therapist: More than 4 times per year   Marital Status: Divorced  Human resources officer Violence: Not At Risk   Fear of Current or Ex-Partner: No   Emotionally Abused: No   Physically Abused: No   Sexually Abused: No    Outpatient Medications Prior to Visit  Medication Sig Dispense Refill   Cholecalciferol (VITAMIN D) 125 MCG (5000 UT) CAPS Take by mouth 2 (two) times daily.     gabapentin (NEURONTIN) 300 MG capsule TAKE (1) CAPSULE THREE TIMES DAILY. 90 capsule 0   GAMMA AMINOBUTYRIC ACID PO Take 200 mg by mouth as needed. Up to bid     hydrocortisone (PROCTOZONE-HC) 2.5 % rectal cream Place 1 application rectally 2 (two) times daily. 30 g 0   Krill Oil 1000 MG CAPS      Levothyroxine Sodium 100 MCG CAPS 1 tablet daily and 1/2 tablet on Friday 30 capsule    LORazepam (ATIVAN) 2 MG tablet Take 1 tablet (2 mg total) by mouth every 8 (eight) hours as needed for anxiety. 40 tablet 0   MAGNESIUM GLYCINATE PLUS PO Take 100 mg by mouth 2 (two) times daily.     nystatin ointment (MYCOSTATIN)      solifenacin (VESICARE) 5 MG tablet Take 1 tablet (5 mg total) by mouth daily. 90 tablet 1   thyroid (ARMOUR) 15 MG tablet Take 0.5 tablets (7.5 mg total) by mouth. 7.5 mg qd     dextroamphetamine (DEXTROSTAT) 10 MG tablet Take 1 tablet (10 mg total) by mouth daily. 30 tablet  0   escitalopram (LEXAPRO) 5 MG tablet Take 1 tablet (5 mg total) by mouth daily. 90 tablet 2   scopolamine (TRANSDERM-SCOP, 1.5 MG,) 1 MG/3DAYS Place 1 patch (1.5 mg total) onto the skin every 3 (three) days. 3 patch 0   No facility-administered  medications prior to visit.    Allergies  Allergen Reactions   Hydrocodone-Acetaminophen Nausea And Vomiting   Gluten Meal Other (See Comments)    GI symptoms; has BM everytime she urinates GI symptoms; has BM everytime she urinates   Oxycodone Nausea And Vomiting    Review of Systems  Constitutional:  Negative for chills, fever and malaise/fatigue.  HENT:  Negative for congestion, sinus pain and sore throat.   Eyes:  Negative for blurred vision.  Respiratory:  Negative for cough and shortness of breath.   Cardiovascular:  Negative for chest pain, palpitations and leg swelling.  Gastrointestinal:  Negative for blood in stool, diarrhea, nausea and vomiting.  Genitourinary:  Negative for flank pain and frequency.  Musculoskeletal:  Negative for back pain.  Skin:  Negative for rash.  Neurological:  Negative for headaches.      Objective:    Physical Exam Constitutional:      Appearance: Normal appearance.  HENT:     Head: Normocephalic and atraumatic.     Right Ear: External ear normal.     Left Ear: External ear normal.  Pulmonary:     Effort: Pulmonary effort is normal.  Musculoskeletal:        General: Normal range of motion.     Cervical back: Normal range of motion.  Skin:    General: Skin is dry.  Neurological:     Mental Status: She is alert and oriented to person, place, and time.  Psychiatric:        Behavior: Behavior normal.    Pulse 63   LMP 03/29/2004  Wt Readings from Last 3 Encounters:  09/14/20 171 lb 6.4 oz (77.7 kg)  09/05/20 170 lb (77.1 kg)  08/16/20 172 lb (78 kg)    Diabetic Foot Exam - Simple   No data filed    Lab Results  Component Value Date   WBC 5.3 09/14/2020   HGB 13.3 09/14/2020    HCT 39.3 09/14/2020   PLT 215.0 09/14/2020   GLUCOSE 88 09/14/2020   CHOL 195 09/14/2020   TRIG 97.0 09/14/2020   HDL 60.60 09/14/2020   LDLCALC 115 (H) 09/14/2020   ALT 15 09/14/2020   AST 22 09/14/2020   NA 141 09/14/2020   K 4.8 09/14/2020   CL 104 09/14/2020   CREATININE 0.92 09/14/2020   BUN 16 09/14/2020   CO2 32 09/14/2020   TSH 0.41 09/14/2020   HGBA1C 5.3 09/14/2020   MICROALBUR 0.2 02/28/2014    Lab Results  Component Value Date   TSH 0.41 09/14/2020   Lab Results  Component Value Date   WBC 5.3 09/14/2020   HGB 13.3 09/14/2020   HCT 39.3 09/14/2020   MCV 97.3 09/14/2020   PLT 215.0 09/14/2020   Lab Results  Component Value Date   NA 141 09/14/2020   K 4.8 09/14/2020   CO2 32 09/14/2020   GLUCOSE 88 09/14/2020   BUN 16 09/14/2020   CREATININE 0.92 09/14/2020   BILITOT 0.7 09/14/2020   ALKPHOS 77 09/14/2020   AST 22 09/14/2020   ALT 15 09/14/2020   PROT 6.5 09/14/2020   ALBUMIN 4.3 09/14/2020   CALCIUM 9.4 09/14/2020   GFR 63.31 09/14/2020   Lab Results  Component Value Date   CHOL 195 09/14/2020   Lab Results  Component Value Date   HDL 60.60 09/14/2020   Lab Results  Component Value Date   LDLCALC 115 (H) 09/14/2020   Lab Results  Component Value Date  TRIG 97.0 09/14/2020   Lab Results  Component Value Date   CHOLHDL 3 09/14/2020   Lab Results  Component Value Date   HGBA1C 5.3 09/14/2020       Assessment & Plan:   Problem List Items Addressed This Visit     Adjustment disorder with mixed anxiety and depressed mood    Refill given on Lexapro, doing well      Hyperglycemia    hgba1c acceptable, minimize simple carbs. Increase exercise as tolerated. She had an episode of confusion and trouble word finding after a busy day recently she says she has had similar incidents in the more distant past before. She possibly had a low sugar episode. Encouraged to hdyrate well and eat protein when eating carbs every 4-5 hours. Seek  care if happens again      ADD (attention deficit disorder)    Dextrostat 10 mg has been helpful. Refill given and no changes today. She denies any side effects.          Meds ordered this encounter  Medications   dextroamphetamine (DEXTROSTAT) 10 MG tablet    Sig: Take 1 tablet (10 mg total) by mouth daily. August 2022    Dispense:  30 tablet    Refill:  0   escitalopram (LEXAPRO) 10 MG tablet    Sig: Take 1 tablet (10 mg total) by mouth daily.    Dispense:  30 tablet    Refill:  1     I discussed the assessment and treatment plan with the patient. The patient was provided an opportunity to ask questions and all were answered. The patient agreed with the plan and demonstrated an understanding of the instructions.   The patient was advised to call back or seek an in-person evaluation if the symptoms worsen or if the condition fails to improve as anticipated.  I provided 17 minutes of face-to-face time during this encounter.   Penni Homans, MD [facility] at [facility] [phone] (phone) [phone] (fax)  Garner, Suezanne Jacquet, acting as a scribe for Penni Homans, MD, have documented all relevent documentation on behalf of Penni Homans, MD, as directed by Penni Homans, MD while in the presence of Penni Homans, MD.  I, Mosie Lukes, MD personally performed the services described in this documentation. All medical record entries made by the scribe were at my direction and in my presence. I have reviewed the chart and agree that the record reflects my personal performance and is accurate and complete

## 2020-12-20 NOTE — Assessment & Plan Note (Signed)
hgba1c acceptable, minimize simple carbs. Increase exercise as tolerated. She had an episode of confusion and trouble word finding after a busy day recently she says she has had similar incidents in the more distant past before. She possibly had a low sugar episode. Encouraged to hdyrate well and eat protein when eating carbs every 4-5 hours. Seek care if happens again

## 2020-12-20 NOTE — Assessment & Plan Note (Signed)
Dextrostat 10 mg has been helpful. Refill given and no changes today. She denies any side effects.

## 2020-12-20 NOTE — Assessment & Plan Note (Signed)
Refill given on Lexapro, doing well

## 2020-12-22 ENCOUNTER — Encounter: Payer: Self-pay | Admitting: Family Medicine

## 2021-01-04 ENCOUNTER — Telehealth: Payer: Self-pay | Admitting: Family Medicine

## 2021-01-04 NOTE — Telephone Encounter (Signed)
Patient called stating she was supposed to get a call from [place], in order to get her a vv appointment with Charlett Blake for a med refill. She stated she has to have one every month in order to get a refill on her medicine dextroamphetamine (DEXTROSTAT) 10 MG tablet.  She was told she would receive a call back from [place] to figure out if she needs to be added to [place] schedule or if she can see another provider for her refill. Please advise.

## 2021-01-05 NOTE — Telephone Encounter (Signed)
Looks like patient did not get a call from [place].  She is due around 01/19/21 (Friday).  For the week of 9/26-9/30 can we fit her in for video visit somewhere?

## 2021-01-08 NOTE — Telephone Encounter (Signed)
Spoke with patient and she would like to see Dr. Charlett Blake but will be willing to see someone else.  The week that she is due for her appt, [place]  She will be in town the week of 01/22/21, but she will be out of medication by this time.  She did not receive a call back from her last visit to schedule (which was 12/19/20).  Advised that I will discuss with Dr. Charlett Blake face-to-face and give her a call back tomorrow.

## 2021-01-09 NOTE — Telephone Encounter (Signed)
Patient notified, she is scheduled for 01/23/21 at 340pm as virtual.  She will request on 01/19/21 for refill to send to [facility].

## 2021-01-18 ENCOUNTER — Other Ambulatory Visit: Payer: Self-pay | Admitting: Family Medicine

## 2021-01-19 MED ORDER — ESCITALOPRAM OXALATE 10 MG PO TABS: 10.0000 mg | ORAL_TABLET | Freq: Every day | ORAL | 1 refills | Status: DC

## 2021-01-22 DIAGNOSIS — Z23 Encounter for immunization: Secondary | ICD-10-CM | POA: Diagnosis not present

## 2021-01-23 ENCOUNTER — Encounter: Payer: Self-pay | Admitting: Family Medicine

## 2021-01-23 ENCOUNTER — Other Ambulatory Visit: Payer: Self-pay

## 2021-01-23 ENCOUNTER — Telehealth (INDEPENDENT_AMBULATORY_CARE_PROVIDER_SITE_OTHER): Payer: Medicare Other | Admitting: Family Medicine

## 2021-01-23 VITALS — BP 114/79 | HR 59

## 2021-01-23 DIAGNOSIS — R413 Other amnesia: Secondary | ICD-10-CM

## 2021-01-23 DIAGNOSIS — E782 Mixed hyperlipidemia: Secondary | ICD-10-CM

## 2021-01-23 DIAGNOSIS — Z0000 Encounter for general adult medical examination without abnormal findings: Secondary | ICD-10-CM | POA: Diagnosis not present

## 2021-01-23 DIAGNOSIS — R350 Frequency of micturition: Secondary | ICD-10-CM | POA: Diagnosis not present

## 2021-01-23 DIAGNOSIS — N3281 Overactive bladder: Secondary | ICD-10-CM | POA: Diagnosis not present

## 2021-01-23 DIAGNOSIS — R739 Hyperglycemia, unspecified: Secondary | ICD-10-CM | POA: Diagnosis not present

## 2021-01-23 DIAGNOSIS — E039 Hypothyroidism, unspecified: Secondary | ICD-10-CM

## 2021-01-23 DIAGNOSIS — F988 Other specified behavioral and emotional disorders with onset usually occurring in childhood and adolescence: Secondary | ICD-10-CM | POA: Diagnosis not present

## 2021-01-23 DIAGNOSIS — E559 Vitamin D deficiency, unspecified: Secondary | ICD-10-CM

## 2021-01-23 MED ORDER — ESCITALOPRAM OXALATE 10 MG PO TABS: 10.0000 mg | ORAL_TABLET | Freq: Every day | ORAL | 1 refills | Status: DC

## 2021-01-23 MED ORDER — DEXTROAMPHETAMINE SULFATE 10 MG PO TABS: 10.0000 mg | ORAL_TABLET | Freq: Every day | ORAL | 0 refills | Status: DC

## 2021-01-23 NOTE — Progress Notes (Signed)
MyChart Video Visit    Virtual Visit via Video Note   This visit type was conducted due to national recommendations for restrictions regarding the COVID-19 Pandemic (e.g. social distancing) in an effort to limit this patient's exposure and mitigate transmission in our community. This patient is at least at moderate risk for complications without adequate follow up. This format is felt to be most appropriate for this patient at this time. Physical exam was limited by quality of the video and audio technology used for the visit. S. Chism, [name] was able to get the patient set up on a video visit.  Patient location: home Patient and provider in visit Provider location: Office  I discussed the limitations of evaluation and management by telemedicine and the availability of in person appointments. The patient expressed understanding and agreed to proceed.  Visit Date: 01/26/21  Today's healthcare provider: Penni Homans, MD     Subjective:    Patient ID: Shannon [name], female    DOB: [birth year], 70 y.o.   MRN: [id number]  Chief Complaint  Patient presents with   Follow-up    HPI Patient is in today for virtual visit for follow up on medication management for ADD. She still has trouble with focus and organization, but she feels like it is helping. Denies CP/palp/SOB/HA/congestion/fevers/GI or GU c/o. Taking meds as prescribed.  She arrived back to [place] last night.   Past Medical History:  Diagnosis Date   ADD (attention deficit disorder with hyperactivity)    Anxiety    Asthma    Cancer ([place])    melanoma- lip 2011   Depression    Diverticulosis    Gestational diabetes    Hyperlipidemia, mixed 03/11/2016   Hypothyroidism    Menopause    Osteopenia    Rosacea    Sciatica of left side 02/24/2017   Travel advice encounter 03/25/2016    Past Surgical History:  Procedure Laterality Date   blephoroplasty     BROW LIFT     DILATION AND CURETTAGE OF UTERUS     SKIN  CANCER EXCISION     right lower lip   TONSILLECTOMY AND ADENOIDECTOMY      Family History  Problem Relation Age of Onset   Colon polyps Mother    Parkinson's disease Mother    Diabetes Father    Emphysema Father    Tuberculosis Father    Colon cancer Maternal Grandfather    Alcohol abuse Sister    Cancer Sister        skin cancer, melanoma cell, basil cell   ADD / ADHD Daughter    Anxiety disorder Daughter    Depression Daughter    ADD / ADHD Son    Anxiety disorder Son    Depression Son    Cancer Sister        melanoma   Anxiety disorder Brother     Social History   Socioeconomic History   Marital status: Single    Spouse name: Not on file   Number of children: 2   Years of education: Not on file   Highest education level: Not on file  Occupational History   Occupation: Retired Tourist information centre manager  Tobacco Use   Smoking status: Never   Smokeless tobacco: Never  Vaping Use   Vaping Use: Never used  Substance and Sexual Activity   Alcohol use: Yes    Alcohol/week: 3.0 - 4.0 standard drinks    Types: 1 - 2 Glasses of wine, 1  Cans of beer, 1 Shots of liquor per week    Comment: not every night.   Drug use: No   Sexual activity: Not Currently  Other Topics Concern   Not on file  Social History Narrative   Retired from [employer].   No major dietary restrictions except avoid gluten.       Social Determinants of Health   Financial Resource Strain: Low Risk    Difficulty of Paying Living Expenses: Not hard at all  Food Insecurity: No Food Insecurity   Worried About Charity fundraiser in the Last Year: Never true   [place] in the Last Year: Never true  Transportation Needs: No Transportation Needs   Lack of Transportation (Medical): No   Lack of Transportation (Non-Medical): No  Physical Activity: Insufficiently Active   Days of Exercise per Week: 2 days   Minutes of Exercise per Session: 50 min  Stress: No Stress Concern Present   Feeling of  Stress : Not at all  Social Connections: Moderately Integrated   Frequency of Communication with Friends and Family: More than three times a week   Frequency of Social Gatherings with Friends and Family: More than three times a week   Attends Religious Services: More than 4 times per year   Active Member of [employer] or Organizations: Yes   Attends Music therapist: More than 4 times per year   Marital Status: Divorced  Human resources officer Violence: Not At Risk   Fear of Current or Ex-Partner: No   Emotionally Abused: No   Physically Abused: No   Sexually Abused: No    Outpatient Medications Prior to Visit  Medication Sig Dispense Refill   Cholecalciferol (VITAMIN D) 125 MCG (5000 UT) CAPS Take by mouth 2 (two) times daily.     gabapentin (NEURONTIN) 300 MG capsule TAKE (1) CAPSULE THREE TIMES DAILY. 90 capsule 0   GAMMA AMINOBUTYRIC ACID PO Take 200 mg by mouth as needed. Up to bid     hydrocortisone (PROCTOZONE-HC) 2.5 % rectal cream Place 1 application rectally 2 (two) times daily. 30 g 0   Krill Oil 1000 MG CAPS      Levothyroxine Sodium 100 MCG CAPS 1 tablet daily and 1/2 tablet on Friday 30 capsule    LORazepam (ATIVAN) 2 MG tablet Take 1 tablet (2 mg total) by mouth every 8 (eight) hours as needed for anxiety. 40 tablet 0   MAGNESIUM GLYCINATE PLUS PO Take 100 mg by mouth 2 (two) times daily.     nystatin ointment (MYCOSTATIN)      solifenacin (VESICARE) 5 MG tablet Take 1 tablet (5 mg total) by mouth daily. 90 tablet 1   thyroid (ARMOUR) 15 MG tablet Take 0.5 tablets (7.5 mg total) by mouth. 7.5 mg qd     dextroamphetamine (DEXTROSTAT) 10 MG tablet Take 1 tablet (10 mg total) by mouth daily. August 2022 30 tablet 0   escitalopram (LEXAPRO) 10 MG tablet Take 1 tablet (10 mg total) by mouth daily. 90 tablet 1   No facility-administered medications prior to visit.    Allergies  Allergen Reactions   Hydrocodone-Acetaminophen Nausea And Vomiting   Gluten Meal Other (See  Comments)    GI symptoms; has BM everytime she urinates GI symptoms; has BM everytime she urinates   Oxycodone Nausea And Vomiting    Review of Systems  Constitutional:  Negative for chills, fever and malaise/fatigue.  HENT:  Negative for congestion, sinus pain and sore throat.  Eyes:  Negative for blurred vision.  Respiratory:  Negative for cough and shortness of breath.   Cardiovascular:  Negative for chest pain, palpitations and leg swelling.  Gastrointestinal:  Negative for blood in stool, diarrhea, nausea and vomiting.  Genitourinary:  Negative for flank pain and frequency.  Musculoskeletal:  Negative for back pain.  Skin:  Negative for rash.  Neurological:  Negative for headaches.      Objective:    Physical Exam Constitutional:      Appearance: Normal appearance.  HENT:     Head: Normocephalic and atraumatic.     Right Ear: External ear normal.     Left Ear: External ear normal.  Pulmonary:     Effort: Pulmonary effort is normal.  Musculoskeletal:        General: Normal range of motion.     Cervical back: Normal range of motion.  Skin:    General: Skin is dry.  Neurological:     Mental Status: She is alert and oriented to person, place, and time.  Psychiatric:        Behavior: Behavior normal.    BP 114/79   Pulse (!) 59   LMP 03/29/2004  Wt Readings from Last 3 Encounters:  09/14/20 171 lb 6.4 oz (77.7 kg)  09/05/20 170 lb (77.1 kg)  08/16/20 172 lb (78 kg)    Diabetic Foot Exam - Simple   No data filed    Lab Results  Component Value Date   WBC 4.3 01/25/2021   HGB 14.0 01/25/2021   HCT 41.3 01/25/2021   PLT 231.0 01/25/2021   GLUCOSE 70 01/25/2021   CHOL 183 01/25/2021   TRIG 89.0 01/25/2021   HDL 52.30 01/25/2021   LDLCALC 113 (H) 01/25/2021   ALT 14 01/25/2021   AST 22 01/25/2021   NA 138 01/25/2021   K 4.0 01/25/2021   CL 104 01/25/2021   CREATININE 0.99 01/25/2021   BUN 20 01/25/2021   CO2 28 01/25/2021   TSH 0.49 01/25/2021    HGBA1C 5.4 01/25/2021   MICROALBUR 0.2 02/28/2014    Lab Results  Component Value Date   TSH 0.49 01/25/2021   Lab Results  Component Value Date   WBC 4.3 01/25/2021   HGB 14.0 01/25/2021   HCT 41.3 01/25/2021   MCV 98.3 01/25/2021   PLT 231.0 01/25/2021   Lab Results  Component Value Date   NA 138 01/25/2021   K 4.0 01/25/2021   CO2 28 01/25/2021   GLUCOSE 70 01/25/2021   BUN 20 01/25/2021   CREATININE 0.99 01/25/2021   BILITOT 0.7 01/25/2021   ALKPHOS 64 01/25/2021   AST 22 01/25/2021   ALT 14 01/25/2021   PROT 6.8 01/25/2021   ALBUMIN 4.1 01/25/2021   CALCIUM 9.2 01/25/2021   GFR 57.83 (L) 01/25/2021   Lab Results  Component Value Date   CHOL 183 01/25/2021   Lab Results  Component Value Date   HDL 52.30 01/25/2021   Lab Results  Component Value Date   LDLCALC 113 (H) 01/25/2021   Lab Results  Component Value Date   TRIG 89.0 01/25/2021   Lab Results  Component Value Date   CHOLHDL 3 01/25/2021   Lab Results  Component Value Date   HGBA1C 5.4 01/25/2021       Assessment & Plan:   Problem List Items Addressed This Visit     Hypothyroidism - Primary    On Levothyroxine, continue to monitor      Relevant Orders  CBC (Completed)   TSH (Completed)   T4, free (Completed)   Vitamin D deficiency    Supplement and monitor      Relevant Orders   CBC (Completed)   VITAMIN D 25 Hydroxy (Vit-D Deficiency, Fractures) (Completed)   Hyperlipidemia, mixed    Encourage heart healthy diet such as MIND or DASH diet, increase exercise, avoid trans fats, simple carbohydrates and processed foods, consider a krill or fish or flaxseed oil cap daily.        Relevant Orders   CBC (Completed)   Lipid panel (Completed)   Preventative health care   OAB (overactive bladder)    Good response to vesicare she has an appt with urogynecology soon for further consideration.       Hyperglycemia    hgba1c acceptable, minimize simple carbs. Increase exercise as  tolerated.       Relevant Orders   CBC (Completed)   Comprehensive metabolic panel (Completed)   Hemoglobin A1c (Completed)   ADD (attention deficit disorder)    Doing well on current dose of medication Georga continue without dosing change      Memory deficit    She is back from her summer home and ready to go for her neuropsych testing soon         Meds ordered this encounter  Medications   dextroamphetamine (DEXTROSTAT) 10 MG tablet    Sig: Take 1 tablet (10 mg total) by mouth daily. October 2022    Dispense:  30 tablet    Refill:  0   escitalopram (LEXAPRO) 10 MG tablet    Sig: Take 1 tablet (10 mg total) by mouth daily.    Dispense:  90 tablet    Refill:  1    I discussed the assessment and treatment plan with the patient. The patient was provided an opportunity to ask questions and all were answered. The patient agreed with the plan and demonstrated an understanding of the instructions.   The patient was advised to call back or seek an in-person evaluation if the symptoms worsen or if the condition fails to improve as anticipated.  I provided 26 minutes of face-to-face time during this encounter.   Penni Homans, MD [facility] [place] [facility] [phone] (phone) [phone] (fax)  [employer]ergoo, Suezanne Jacquet, acting as a scribe for Penni Homans, MD, have documented all relevent documentation on behalf of Penni Homans, MD, as directed by Penni Homans, MD while in the presence of Penni Homans, MD. DO:01/26/21.  I, Mosie Lukes, MD personally performed the services described in this documentation. All medical record entries made by the scribe were at my direction and in my presence. I have reviewed the chart and agree that the record reflects my personal performance and is accurate and complete

## 2021-01-24 ENCOUNTER — Encounter: Payer: Self-pay | Admitting: Family Medicine

## 2021-01-25 ENCOUNTER — Other Ambulatory Visit (INDEPENDENT_AMBULATORY_CARE_PROVIDER_SITE_OTHER): Payer: Medicare Other

## 2021-01-25 DIAGNOSIS — R739 Hyperglycemia, unspecified: Secondary | ICD-10-CM | POA: Diagnosis not present

## 2021-01-25 DIAGNOSIS — E559 Vitamin D deficiency, unspecified: Secondary | ICD-10-CM | POA: Diagnosis not present

## 2021-01-25 DIAGNOSIS — E782 Mixed hyperlipidemia: Secondary | ICD-10-CM | POA: Diagnosis not present

## 2021-01-25 DIAGNOSIS — R350 Frequency of micturition: Secondary | ICD-10-CM | POA: Diagnosis not present

## 2021-01-25 DIAGNOSIS — E039 Hypothyroidism, unspecified: Secondary | ICD-10-CM

## 2021-01-25 LAB — CBC
HCT: 41.3 % (ref 36.0–46.0)
Hemoglobin: 14 g/dL (ref 12.0–15.0)
MCHC: 33.8 g/dL (ref 30.0–36.0)
MCV: 98.3 fl (ref 78.0–100.0)
Platelets: 231 10*3/uL (ref 150.0–400.0)
RBC: 4.2 Mil/uL (ref 3.87–5.11)
RDW: 12 % (ref 11.5–15.5)
WBC: 4.3 10*3/uL (ref 4.0–10.5)

## 2021-01-25 LAB — COMPREHENSIVE METABOLIC PANEL
ALT: 14 U/L (ref 0–35)
AST: 22 U/L (ref 0–37)
Albumin: 4.1 g/dL (ref 3.5–5.2)
Alkaline Phosphatase: 64 U/L (ref 39–117)
BUN: 20 mg/dL (ref 6–23)
CO2: 28 mEq/L (ref 19–32)
Calcium: 9.2 mg/dL (ref 8.4–10.5)
Chloride: 104 mEq/L (ref 96–112)
Creatinine, Ser: 0.99 mg/dL (ref 0.40–1.20)
GFR: 57.83 mL/min — ABNORMAL LOW (ref >60.00)
Glucose, Bld: 70 mg/dL (ref 70–99)
Potassium: 4 mEq/L (ref 3.5–5.1)
Sodium: 138 mEq/L (ref 135–145)
Total Bilirubin: 0.7 mg/dL (ref 0.2–1.2)
Total Protein: 6.8 g/dL (ref 6.0–8.3)

## 2021-01-25 LAB — VITAMIN D 25 HYDROXY (VIT D DEFICIENCY, FRACTURES): VITD: 87.84 ng/mL (ref 30.00–100.00)

## 2021-01-25 LAB — URINALYSIS
Bilirubin Urine: NEGATIVE
Ketones, ur: NEGATIVE
Leukocytes,Ua: NEGATIVE
Nitrite: NEGATIVE
Specific Gravity, Urine: <=1.005 — AB (ref 1.000–1.030)
Total Protein, Urine: NEGATIVE
Urine Glucose: NEGATIVE
Urobilinogen, UA: 0.2 (ref 0.0–1.0)
pH: 6 (ref 5.0–8.0)

## 2021-01-25 LAB — LIPID PANEL
Cholesterol: 183 mg/dL (ref 0–200)
HDL: 52.3 mg/dL (ref >39.00)
LDL Cholesterol: 113 mg/dL — ABNORMAL HIGH (ref 0–99)
NonHDL: 130.65
Total CHOL/HDL Ratio: 3
Triglycerides: 89 mg/dL (ref 0.0–149.0)
VLDL: 17.8 mg/dL (ref 0.0–40.0)

## 2021-01-25 LAB — T4, FREE: Free T4: 1.13 ng/dL (ref 0.60–1.60)

## 2021-01-25 LAB — HEMOGLOBIN A1C: Hgb A1c MFr Bld: 5.4 % (ref 4.6–6.5)

## 2021-01-25 LAB — TSH: TSH: 0.49 u[IU]/mL (ref 0.35–5.50)

## 2021-01-26 LAB — URINE CULTURE
MICRO NUMBER:: 12439869
Result:: NO GROWTH
SPECIMEN QUALITY:: ADEQUATE

## 2021-01-26 NOTE — Assessment & Plan Note (Signed)
Doing well on current dose of medication Shannon [name] continue without dosing change

## 2021-01-26 NOTE — Assessment & Plan Note (Signed)
She is back from her summer home and ready to go for her neuropsych testing soon

## 2021-01-26 NOTE — Assessment & Plan Note (Signed)
On Levothyroxine, continue to monitor 

## 2021-01-26 NOTE — Assessment & Plan Note (Signed)
Encourage heart healthy diet such as MIND or DASH diet, increase exercise, avoid trans fats, simple carbohydrates and processed foods, consider a krill or fish or flaxseed oil cap daily.  °

## 2021-01-26 NOTE — Assessment & Plan Note (Signed)
Good response to vesicare she has an appt with urogynecology soon for further consideration.

## 2021-01-26 NOTE — Assessment & Plan Note (Signed)
Supplement and monitor 

## 2021-01-26 NOTE — Assessment & Plan Note (Signed)
hgba1c acceptable, minimize simple carbs. Increase exercise as tolerated.  

## 2021-01-31 DIAGNOSIS — T819XXA Unspecified complication of procedure, initial encounter: Secondary | ICD-10-CM | POA: Diagnosis not present

## 2021-02-07 DIAGNOSIS — Z87891 Personal history of nicotine dependence: Secondary | ICD-10-CM | POA: Diagnosis not present

## 2021-02-07 DIAGNOSIS — I6523 Occlusion and stenosis of bilateral carotid arteries: Secondary | ICD-10-CM | POA: Diagnosis not present

## 2021-02-12 ENCOUNTER — Ambulatory Visit (INDEPENDENT_AMBULATORY_CARE_PROVIDER_SITE_OTHER): Payer: Medicare Other | Admitting: Internal Medicine

## 2021-02-12 ENCOUNTER — Other Ambulatory Visit: Payer: Self-pay

## 2021-02-12 ENCOUNTER — Encounter: Payer: Self-pay | Admitting: Internal Medicine

## 2021-02-12 VITALS — BP 114/72 | HR 61 | Ht 65.5 in | Wt 168.2 lb

## 2021-02-12 DIAGNOSIS — E039 Hypothyroidism, unspecified: Secondary | ICD-10-CM | POA: Diagnosis not present

## 2021-02-12 DIAGNOSIS — E559 Vitamin D deficiency, unspecified: Secondary | ICD-10-CM | POA: Diagnosis not present

## 2021-02-12 MED ORDER — LEVOTHYROXINE SODIUM 100 MCG PO CAPS: 100.0000 ug | ORAL_CAPSULE | Freq: Every day | ORAL | 1 refills | Status: DC

## 2021-02-12 NOTE — Progress Notes (Signed)
Name: Shannon [name]  MRN/ DOB: [id number], [birth year]    Age/ Sex: 70 y.o., female    PCP: Mosie Lukes, MD   Reason for Endocrinology Evaluation: Hypothyroid     Date of Initial Endocrinology Evaluation: 02/12/2021     HPI: Shannon [name] is a 70 y.o. female with a past medical history of Hypothyroidism, gluten intolerance and Asthma . The patient presented for initial endocrinology clinic visit on 02/12/2021 for consultative assistance with her hypothyroid.   She has been diagnosed with hypothyroidism in 2005.   She was following with an Heritage manager in [place] . She is on Tirosint 100 mcg daily at night AND Desiccated thyroid 7.5 mg daily, has been on this regimen for ~ 10 yrs   She has gluten intolerance but no celiac disease diagnosis  Historically her thyroid replacement therapy has been adjusted based on her symptoms, she had noted during the summer that she has depression and leg cramps as well as occasional sleeping issues and would decrease thyroid hormone replacement therapy during the summer season to half a tablet once a week and 1 tablet the rest of the week.  In the fall she tends to go up to taking 1 tablet daily and has noted improvement in her symptoms    Has had intentional weight loss over the past year Denies loose stool or diarrhea  Denies palpitations Has cold intolerance  Denies local neck swelling    She has been diagnosed with adult ADHD  She also has been diagnosed with cognitive impairment, pending testing  She is on Vitamin D 4000 iu daily , she takes 2000 IU during the summer months  No FH of thyroid disease   HISTORY:  Past Medical History:  Past Medical History:  Diagnosis Date   ADD (attention deficit disorder with hyperactivity)    Anxiety    Asthma    Cancer ([place])    melanoma- lip 2011   Depression    Diverticulosis    Gestational diabetes    Hyperlipidemia, mixed 03/11/2016   Hypothyroidism    Menopause     Osteopenia    Rosacea    Sciatica of left side 02/24/2017   Travel advice encounter 03/25/2016   Past Surgical History:  Past Surgical History:  Procedure Laterality Date   blephoroplasty     BROW LIFT     DILATION AND CURETTAGE OF UTERUS     SKIN CANCER EXCISION     right lower lip   TONSILLECTOMY AND ADENOIDECTOMY      Social History:  reports that she has never smoked. She has never used smokeless tobacco. She reports current alcohol use of about 3.0 - 4.0 standard drinks per week. She reports that she does not use drugs. Family History: family history includes ADD / ADHD in her daughter and son; Alcohol abuse in her sister; Anxiety disorder in her brother, daughter, and son; Cancer in her sister and sister; Colon cancer in her maternal grandfather; Colon polyps in her mother; Depression in her daughter and son; Diabetes in her father; Emphysema in her father; Parkinson's disease in her mother; Tuberculosis in her father.   HOME MEDICATIONS: Allergies as of 02/12/2021       Reactions   Hydrocodone-acetaminophen Nausea And Vomiting   Acetaminophen Other (See Comments)   Gluten Meal Other (See Comments)   GI symptoms; has BM everytime she urinates GI symptoms; has BM everytime she urinates   Oxycodone Nausea And Vomiting  Medication List        Accurate as of February 12, 2021  1:05 PM. If you have any questions, ask your nurse or doctor.          STOP taking these medications    gabapentin 300 MG capsule Commonly known as: NEURONTIN Stopped by: Dorita Sciara, MD   thyroid 15 MG tablet Commonly known as: ARMOUR Stopped by: Dorita Sciara, MD   Vitamin D 125 MCG (5000 UT) Caps Stopped by: Dorita Sciara, MD       TAKE these medications    dextroamphetamine 10 MG tablet Commonly known as: DEXTROSTAT Take 1 tablet (10 mg total) by mouth daily. October 2022   escitalopram 10 MG tablet Commonly known as: Lexapro Take 1 tablet (10  mg total) by mouth daily.   GAMMA AMINOBUTYRIC ACID PO Take 200 mg by mouth as needed. Up to bid   hydrocortisone 2.5 % rectal cream Commonly known as: Proctozone-HC Place 1 application rectally 2 (two) times daily.   Krill Oil 1000 MG Caps   Levothyroxine Sodium 100 MCG Caps Commonly known as: Tirosint Take 1 capsule (100 mcg total) by mouth daily before breakfast. What changed:  how much to take how to take this when to take this additional instructions Changed by: Dorita Sciara, MD   LORazepam 2 MG tablet Commonly known as: ATIVAN Take 1 tablet (2 mg total) by mouth every 8 (eight) hours as needed for anxiety.   MAGNESIUM GLYCINATE PLUS PO Take 100 mg by mouth 2 (two) times daily.   nystatin ointment Commonly known as: MYCOSTATIN   solifenacin 5 MG tablet Commonly known as: VESIcare Take 1 tablet (5 mg total) by mouth daily.          REVIEW OF SYSTEMS: A comprehensive ROS was conducted with the patient and is negative except as per HPI     OBJECTIVE:  VS: BP 114/72 (BP Location: Left Arm, Patient Position: Sitting, Cuff Size: Small)   Pulse 61   Ht 5' 5.5" (1.664 m)   Wt 168 lb 3.2 oz (76.3 kg)   LMP 03/29/2004   SpO2 99%   BMI 27.56 kg/m    Wt Readings from Last 3 Encounters:  02/12/21 168 lb 3.2 oz (76.3 kg)  09/14/20 171 lb 6.4 oz (77.7 kg)  09/05/20 170 lb (77.1 kg)     EXAM: General: Pt appears well and is in NAD  Neck: General: Supple without adenopathy. Thyroid: Thyroid size normal.  No goiter or nodules appreciated. No thyroid bruit.  Lungs: Clear with good BS bilat with no rales, rhonchi, or wheezes  Heart: Auscultation: RRR.  Abdomen: Normoactive bowel sounds, soft, nontender, without masses or organomegaly palpable  Extremities:  BL LE: No pretibial edema normal ROM and strength.  Mental Status: Judgment, insight: Intact Orientation: Oriented to time, place, and person Mood and affect: No depression, anxiety, or agitation      DATA REVIEWED: Results for DAO, MEARNS (MRN [id number]) as of 02/12/2021 13:01  Ref. Range 01/25/2021 10:22  COMPREHENSIVE METABOLIC PANEL Unknown Rpt (A)  Sodium Latest Ref Range: 135 - 145 mEq/L 138  Potassium Latest Ref Range: 3.5 - 5.1 mEq/L 4.0  Chloride Latest Ref Range: 96 - 112 mEq/L 104  CO2 Latest Ref Range: 19 - 32 mEq/L 28  Glucose Latest Ref Range: 70 - 99 mg/dL 70  BUN Latest Ref Range: 6 - 23 mg/dL 20  Creatinine Latest Ref Range: 0.40 - 1.20 mg/dL 0.99  Calcium Latest Ref Range: 8.4 - 10.5 mg/dL 9.2  Alkaline Phosphatase Latest Ref Range: 39 - 117 U/L 64  Albumin Latest Ref Range: 3.5 - 5.2 g/dL 4.1  AST Latest Ref Range: 0 - 37 U/L 22  ALT Latest Ref Range: 0 - 35 U/L 14  Total Protein Latest Ref Range: 6.0 - 8.3 g/dL 6.8  Total Bilirubin Latest Ref Range: 0.2 - 1.2 mg/dL 0.7  GFR Latest Ref Range: >60.00 mL/min 57.83 (L)  Total CHOL/HDL Ratio Unknown 3  Cholesterol Latest Ref Range: 0 - 200 mg/dL 183  HDL Cholesterol Latest Ref Range: >39.00 mg/dL 52.30  LDL (calc) Latest Ref Range: 0 - 99 mg/dL 113 (H)  NonHDL Unknown 130.65  Triglycerides Latest Ref Range: 0.0 - 149.0 mg/dL 89.0  VLDL Latest Ref Range: 0.0 - 40.0 mg/dL 17.8  VITD Latest Ref Range: 30.00 - 100.00 ng/mL 87.84  WBC Latest Ref Range: 4.0 - 10.5 K/uL 4.3  RBC Latest Ref Range: 3.87 - 5.11 Mil/uL 4.20  Hemoglobin Latest Ref Range: 12.0 - 15.0 g/dL 14.0  HCT Latest Ref Range: 36.0 - 46.0 % 41.3  MCV Latest Ref Range: 78.0 - 100.0 fl 98.3  MCHC Latest Ref Range: 30.0 - 36.0 g/dL 33.8  RDW Latest Ref Range: 11.5 - 15.5 % 12.0  Platelets Latest Ref Range: 150.0 - 400.0 K/uL 231.0  Hemoglobin A1C Latest Ref Range: 4.6 - 6.5 % 5.4  TSH Latest Ref Range: 0.35 - 5.50 uIU/mL 0.49  T4,Free(Direct) Latest Ref Range: 0.60 - 1.60 ng/dL 1.13      ASSESSMENT/PLAN/RECOMMENDATIONS:   Hypothyroidism:   -This has been attributed to autoimmune thyroid disease (Hashimoto's thyroiditis) per patient -  Patient is clinically euthyroid -No local neck symptoms -We had a long discussion about the difference in alternative medicine versus conventional medicine - I explained to the patient that I do not adjust her thyroid medication based on her symptoms but rather based on her TSH level, as treating based on symptoms will result in iatrogenic hyperthyroidism -We discussed the risk of cardiac arrhythmia and increased bone resorption with hyperthyroid -I have advised the patient to stop the desiccated thyroid at this time, and she Lorna take Tirosint  every morning from now on -- Pt educated extensively on the correct way to take levothyroxine (first thing in the morning with water, 30 minutes before eating or taking other medications). - Pt encouraged to double dose the following day if she were to miss a dose given long half-life of levothyroxine. -We have discussed checking her TSH in 6 weeks  Medications : Stop thyroid 7.5 mg daily Continue Tirosint 100 mcg daily    2. Hx vitamin D deficiency:   -She has been on vitamin D replacement for a long time, she has been noted with hypervitaminosis D in the past, the most recent vitamin D level is at the upper level of normal at 87.84 ng/mL  -We discussed risk of hypercalcemia, CKD, and renal stones with hypovitaminosis D   Medication Decrease vitamin D3 to 3000 IU daily     I spent 45 minutes preparing to see the patient by review of recent labs, imaging and procedures, obtaining and reviewing separately obtained history, communicating with the patient, ordering medications, tests or procedures, and documenting clinical information in the EHR including the differential Dx, treatment, and any further evaluation and other management     Follow-up in 4 months Labs in 6 weeks   Signed electronically by: Mack Guise, MD  [facility]  [address] [place], [place] [address] Phone:  [phone] FAX: [phone]   CC: Mosie Lukes, [address] [place] [address] Phone: [phone] Fax: [phone]   Return to Endocrinology clinic as below: Future Appointments  Date Time Provider [place]  02/14/2021  9:40 AM Jaquita Folds, MD [facility]  02/15/2021  1:00 PM Hazle Coca, PhD [facility] None  02/15/2021  2:00 PM [facility] [place] None  03/01/2021  2:30 PM Hazle Coca, PhD [facility] None  04/16/2021 11:20 AM Mosie Lukes, MD [facility]  06/20/2021 11:10 AM Jazz Rogala, Melanie Crazier, MD [facility] None  07/19/2021  2:40 PM [facility]

## 2021-02-12 NOTE — Patient Instructions (Signed)
Stop Thyroid 7.5 mg daily  Continue Tirosint 100 mcg daily    Labs in 6 weeks    You are on levothyroxine - which is your thyroid hormone supplement. You MUST take this consistently.  You should take this first thing in the morning on an empty stomach with water. You should not take it with other medications. Wait 32min to 1hr prior to eating. If you are taking any vitamins - please take these in the evening.   If you miss a dose, please take your missed dose the following day (double the dose for that day). You should have a pill box for ONLY levothyroxine on your bedside table to help you remember to take your medications.

## 2021-02-13 ENCOUNTER — Telehealth: Payer: Self-pay | Admitting: Psychology

## 2021-02-13 DIAGNOSIS — Z20822 Contact with and (suspected) exposure to covid-19: Secondary | ICD-10-CM | POA: Diagnosis not present

## 2021-02-13 NOTE — Telephone Encounter (Signed)
Called and spoke to patient and she stated that her daughter took a test today and is still Positive for Covid. Informed patient that her daughter is more than welcome to be on Speakerphone for the interview since she is still Covid positive. Patient stated that her son will probably come with her. Patient had no further questions or concerns.
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
## 2021-02-13 NOTE — Telephone Encounter (Signed)
Pt has an appt 10/20. Daughter has covid and it will be her 9th day on the 20th. Shannon [name] would like for her to come but wants to make sure its okay with merz. She has not been around her daughter for 3 weeks

## 2021-02-14 ENCOUNTER — Other Ambulatory Visit: Payer: Self-pay

## 2021-02-14 ENCOUNTER — Encounter: Payer: Self-pay | Admitting: Obstetrics and Gynecology

## 2021-02-14 ENCOUNTER — Ambulatory Visit (INDEPENDENT_AMBULATORY_CARE_PROVIDER_SITE_OTHER): Payer: Medicare Other | Admitting: Obstetrics and Gynecology

## 2021-02-14 VITALS — BP 117/82 | HR 71 | Ht 65.0 in | Wt 168.0 lb

## 2021-02-14 DIAGNOSIS — K5900 Constipation, unspecified: Secondary | ICD-10-CM | POA: Diagnosis not present

## 2021-02-14 DIAGNOSIS — R350 Frequency of micturition: Secondary | ICD-10-CM

## 2021-02-14 DIAGNOSIS — N393 Stress incontinence (female) (male): Secondary | ICD-10-CM | POA: Diagnosis not present

## 2021-02-14 DIAGNOSIS — N3281 Overactive bladder: Secondary | ICD-10-CM | POA: Diagnosis not present

## 2021-02-14 LAB — POCT URINALYSIS DIPSTICK
Appearance: NORMAL
Bilirubin, UA: NEGATIVE
Blood, UA: NEGATIVE
Glucose, UA: NEGATIVE
Ketones, UA: NEGATIVE
Leukocytes, UA: NEGATIVE
Nitrite, UA: NEGATIVE
Protein, UA: NEGATIVE
Spec Grav, UA: <=1.005 — AB (ref 1.010–1.025)
Urobilinogen, UA: 0.2 E.U./dL
pH, UA: 6 (ref 5.0–8.0)

## 2021-02-14 NOTE — Patient Instructions (Signed)
Constipation: Our goal is to achieve formed bowel movements daily or every-other-day.  You Franceen need to try different combinations of the following options to find what works best for you - everybody's body works differently so feel free to adjust the dosages as needed.  Some options to help maintain bowel health include:  Dietary changes (more leafy greens, vegetables and fruits; less processed foods) Fiber supplementation (Benefiber, FiberCon, Metamucil or Psyllium). Start slow and increase gradually to full dose. Over-the-counter agents such as: stool softeners (Docusate or Colace) and/or laxatives (Miralax- over the counter, milk of magnesia)  "Power Pudding" is a natural mixture that [place] help your constipation.  To make blend 1 cup applesauce, 1 cup wheat bran, and 3/4 cup prune juice, refrigerate and then take 1 tablespoon daily with a large glass of water as needed.   Stop Vesicare and see how your symptoms are.

## 2021-02-15 ENCOUNTER — Encounter: Payer: Self-pay | Admitting: Psychology

## 2021-02-15 ENCOUNTER — Ambulatory Visit (INDEPENDENT_AMBULATORY_CARE_PROVIDER_SITE_OTHER): Payer: Medicare Other | Admitting: Psychology

## 2021-02-15 ENCOUNTER — Ambulatory Visit: Payer: Medicare Other | Admitting: Psychology

## 2021-02-15 DIAGNOSIS — E039 Hypothyroidism, unspecified: Secondary | ICD-10-CM | POA: Diagnosis not present

## 2021-02-15 DIAGNOSIS — L719 Rosacea, unspecified: Secondary | ICD-10-CM | POA: Insufficient documentation

## 2021-02-15 DIAGNOSIS — F330 Major depressive disorder, recurrent, mild: Secondary | ICD-10-CM | POA: Diagnosis not present

## 2021-02-15 DIAGNOSIS — R41840 Attention and concentration deficit: Secondary | ICD-10-CM

## 2021-02-15 DIAGNOSIS — C44310 Basal cell carcinoma of skin of unspecified parts of face: Secondary | ICD-10-CM | POA: Insufficient documentation

## 2021-02-15 DIAGNOSIS — R4189 Other symptoms and signs involving cognitive functions and awareness: Secondary | ICD-10-CM

## 2021-02-15 DIAGNOSIS — D2370 Other benign neoplasm of skin of unspecified lower limb, including hip: Secondary | ICD-10-CM | POA: Insufficient documentation

## 2021-02-15 DIAGNOSIS — D225 Melanocytic nevi of trunk: Secondary | ICD-10-CM | POA: Insufficient documentation

## 2021-02-15 NOTE — Progress Notes (Signed)
   Psychometrician Note   Cognitive testing was administered to Shannon [name] by Milana Kidney, B.S. (psychometrist) under the supervision of Dr. Christia Reading, Ph.D., licensed psychologist on 02/15/21. Shannon [name] did not appear overtly distressed by the testing session per behavioral observation or responses across self-report questionnaires. Rest breaks were offered.    The battery of tests administered was selected by Dr. Christia Reading, Ph.D. with consideration to Shannon [name]'s current level of functioning, the nature of her symptoms, emotional and behavioral responses during interview, level of literacy, observed level of motivation/effort, and the nature of the referral question. This battery was communicated to the psychometrist. Communication between Dr. Christia Reading, Ph.D. and the psychometrist was ongoing throughout the evaluation and Dr. Christia Reading, Ph.D. was immediately accessible at all times. Dr. Christia Reading, Ph.D. provided supervision to the psychometrist on the date of this service to the extent necessary to assure the quality of all services provided.    Shannon [name] will return within approximately 1-2 weeks for an interactive feedback session with Shannon [name] at which time her test performances, clinical impressions, and treatment recommendations will be reviewed in detail. Shannon [name] understands she can contact our office should she require our assistance before this time.  A total of 125 minutes of billable time were spent face-to-face with Shannon [name] by the psychometrist. This includes both test administration and scoring time. Billing for these services is reflected in the clinical report generated by Dr. Christia Reading, Ph.D.  This note reflects time spent with the psychometrician and does not include test scores or any clinical interpretations made by Shannon [name]. The full report will follow in a separate note.

## 2021-02-15 NOTE — Progress Notes (Signed)
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 receive answers about the evaluation. Oral consent to participate was provided by the patient.   This evaluation was conducted by Christia Reading, Ph.D., licensed clinical neuropsychologist. Ms. Sassano completed a clinical interview with Dr. Melvyn Novas, billed as one unit [phone], and 125 minutes of cognitive testing and scoring, billed as one unit [phone] and three additional units [id number]. Psychometrist Milana Kidney, B.S., assisted Dr. Melvyn Novas with test administration and scoring procedures. As a separate and discrete service, Dr. Melvyn Novas spent a total of 160 minutes in interpretation and report writing billed as one unit [phone] and two units 96133.

## 2021-02-27 DIAGNOSIS — Z20822 Contact with and (suspected) exposure to covid-19: Secondary | ICD-10-CM | POA: Diagnosis not present

## 2021-03-01 ENCOUNTER — Ambulatory Visit (INDEPENDENT_AMBULATORY_CARE_PROVIDER_SITE_OTHER): Payer: Medicare Other | Admitting: Psychology

## 2021-03-01 ENCOUNTER — Other Ambulatory Visit: Payer: Self-pay

## 2021-03-01 ENCOUNTER — Other Ambulatory Visit: Payer: Self-pay | Admitting: Family Medicine

## 2021-03-01 DIAGNOSIS — F330 Major depressive disorder, recurrent, mild: Secondary | ICD-10-CM | POA: Diagnosis not present

## 2021-03-01 DIAGNOSIS — R41840 Attention and concentration deficit: Secondary | ICD-10-CM | POA: Diagnosis not present

## 2021-03-01 DIAGNOSIS — E039 Hypothyroidism, unspecified: Secondary | ICD-10-CM

## 2021-03-01 DIAGNOSIS — M542 Cervicalgia: Secondary | ICD-10-CM | POA: Diagnosis not present

## 2021-03-01 NOTE — Progress Notes (Signed)
   Neuropsychology Feedback Session Shannon [name]. [facility]  Reason for Referral:   Shannon [name] is a 70 y.o. right-handed Caucasian female referred by  Shannon [name], M.D. , to characterize her current cognitive functioning and assist with diagnostic clarity and treatment planning in the context of subjective cognitive decline, several medical comorbidities, and a family history of Parkinson's disease dementia.   Feedback:   [name] completed a comprehensive neuropsychological evaluation on 02/15/2021. Please refer to that encounter for the full report and recommendations. Briefly, results suggested neuropsychological functioning within normal limits. All assessed cognitive domains were well within appropriate normative ranges relative to age-matched peers. Regarding reported day-to-day dysfunction, experienced difficulties could certainly be related to her history of ADHD. Hypothyroidism, if thyroid levels are not appropriately managed, can also affect cognitive dysfunction, particularly surrounding aspects of processing speed and memory. Prior lead exposure can result in cognitive deficits, particularly surrounding executive functioning and visuospatial abilities, however no notable dysfunction was seen across testing. She also does not display neurological signs concerning for high levels of mercury exposure.  [name] was accompanied by her daughter in-person and her son via speakerphone during the current feedback session. Content of the current session focused on the results of her neuropsychological evaluation. [name] was given the opportunity to ask questions and her questions were answered. She was encouraged to reach out should additional questions arise. A copy of her report is available to her on MyChart.      42 minutes were spent conducting the current feedback session with [name], billed as one unit [phone].

## 2021-03-02 NOTE — Telephone Encounter (Signed)
Requesting: dextroamphetamine 10mg  Contract: UDS: Last Visit: 01/23/21 Next Visit: 04/16/2021 Last Refill: 01/23/21  #30 x 0RF  Please Advise

## 2021-03-03 DIAGNOSIS — Z20822 Contact with and (suspected) exposure to covid-19: Secondary | ICD-10-CM | POA: Diagnosis not present

## 2021-03-03 DIAGNOSIS — U071 COVID-19: Secondary | ICD-10-CM | POA: Diagnosis not present

## 2021-03-05 ENCOUNTER — Telehealth: Payer: Medicare Other | Admitting: Family Medicine

## 2021-03-06 ENCOUNTER — Telehealth: Payer: Self-pay | Admitting: Internal Medicine

## 2021-03-06 MED ORDER — LEVOTHYROXINE SODIUM 100 MCG PO CAPS: 100.0000 ug | ORAL_CAPSULE | Freq: Every day | ORAL | 1 refills | Status: DC

## 2021-03-06 NOTE — Progress Notes (Signed)
Patient checked in for appointment. Was noted to have positive COVID test 03/03/21. Was informed that per policy we need to wait 10 days after positive COVID without symptoms test to be seen.   Will call to reschedule.   Jaquita Folds, MD

## 2021-03-06 NOTE — Telephone Encounter (Signed)
Pt requests refill of Levothyroxine Sodium (TIROSINT) 100 MCG CAPS   Shannon [name], Shannon [name] says she feels better since you've changed medication  Pt contact [phone]

## 2021-03-06 NOTE — Telephone Encounter (Signed)
Script sent  

## 2021-03-07 ENCOUNTER — Encounter: Payer: Self-pay | Admitting: Obstetrics and Gynecology

## 2021-03-08 ENCOUNTER — Encounter: Payer: Self-pay | Admitting: Obstetrics and Gynecology

## 2021-03-08 ENCOUNTER — Encounter: Payer: Self-pay | Admitting: Family Medicine

## 2021-03-08 ENCOUNTER — Ambulatory Visit (INDEPENDENT_AMBULATORY_CARE_PROVIDER_SITE_OTHER): Payer: Medicare Other | Admitting: Obstetrics and Gynecology

## 2021-03-08 VITALS — BP 121/77 | HR 67 | Wt 168.0 lb

## 2021-03-08 DIAGNOSIS — N816 Rectocele: Secondary | ICD-10-CM

## 2021-03-13 DIAGNOSIS — M542 Cervicalgia: Secondary | ICD-10-CM | POA: Diagnosis not present

## 2021-03-15 ENCOUNTER — Ambulatory Visit
Admission: EM | Admit: 2021-03-15 | Discharge: 2021-03-15 | Disposition: A | Discharge status: Home / Self Care | Payer: Medicare Other | Attending: Emergency Medicine | Admitting: Emergency Medicine

## 2021-03-15 ENCOUNTER — Other Ambulatory Visit: Payer: Self-pay

## 2021-03-15 DIAGNOSIS — J302 Other seasonal allergic rhinitis: Secondary | ICD-10-CM | POA: Diagnosis not present

## 2021-03-15 DIAGNOSIS — J3089 Other allergic rhinitis: Secondary | ICD-10-CM | POA: Diagnosis not present

## 2021-03-15 DIAGNOSIS — R519 Headache, unspecified: Secondary | ICD-10-CM

## 2021-03-15 DIAGNOSIS — R059 Cough, unspecified: Secondary | ICD-10-CM

## 2021-03-15 DIAGNOSIS — R509 Fever, unspecified: Secondary | ICD-10-CM

## 2021-03-15 LAB — POCT INFLUENZA A/B
Influenza A, POC: NEGATIVE
Influenza B, POC: NEGATIVE

## 2021-03-15 MED ORDER — FLUTICASONE PROPIONATE 50 MCG/ACT NA SUSP: 2.0000 | Freq: Every day | NASAL | 0 refills | Status: DC

## 2021-03-15 MED ORDER — PROAIR RESPICLICK 108 (90 BASE) MCG/ACT IN AEPB: INHALATION_SPRAY | RESPIRATORY_TRACT | 0 refills | Status: DC

## 2021-03-15 MED ORDER — IPRATROPIUM BROMIDE 0.06 % NA SOLN: 2.0000 | Freq: Four times a day (QID) | NASAL | 0 refills | Status: DC

## 2021-03-15 NOTE — ED Provider Notes (Signed)
[facility]    CSN: [id number] Arrival date & time: 03/15/21  1446   History   Chief Complaint No chief complaint on file.  HPI Shannon [name] is a 70 y.o. female. Patient reports COVID infection 2 weeks ago.  Patient states she had a very mild case and really has been feeling fine until last night she began to have a dry cough that was associated with some burning pain in her chest.  Patient states she also felt she had a fever but did not check her temperature.  Temperature on arrival today was 98.6, patient states she was drinking some room temperature fluid prior to arrival, temperature was taken orally.  Also complains of headache.  Patient states she resisted taking a second home COVID test because she felt like it would be positive as a result of positive test 2 weeks ago.  Patient states she has not been around anyone sick that she is aware of.  Patient states she has a history of exercise-induced asthma when she was younger.  Patient states she also found an old albuterol inhaler in her medicine cabinet from 2020, states she does not recall why it was prescribed.  Patient states she also frequently has a runny nose that is clear.  The history is provided by the patient.   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 Left torn meniscus Right arthritis   Lead exposure 03/02/2018   Lumbar degenerative disc disease 02/24/2017   MRI 2019 confirms multilevel change  Sciatica at times 2/2  this and periodic radiculopathy   Major depressive disorder 03/08/2013   Melanocytic nevi of trunk    Menopause    Neck pain on left side 09/10/2018   Suspect this is DDD and DJD - XR is pending Neurapraxic spasm to left trapezius   Neurofibroma 07/20/2019   Nuclear sclerotic cataract of both eyes 07/23/2012   OAB (overactive bladder) 08/05/2016   Osteopenia    Rosacea    Sciatica of left side 02/24/2017   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   By exam she has chronic tendinopathy of Glut Med   Urinary urgency 09/17/2020   Vertigo 04/12/2019   Vitamin D deficiency 02/28/2014   Patient Active Problem List   Diagnosis Date Noted   Basal cell carcinoma of face    Benign neoplasm of skin of lower limb    Melanocytic nevi of trunk    Rosacea    ADHD (attention deficit hyperactivity disorder), 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   Urinary urgency 09/17/2020   History of COVID-19 09/14/2020   Osteopenia 04/11/2020   Neurofibroma 07/20/2019   Vertigo 04/12/2019   Bilateral carotid bruits 04/12/2019   Neck pain on left side 09/10/2018   Lead exposure 03/02/2018   Heart murmur 03/02/2018   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   Hyperglycemia 02/24/2017   Lumbar degenerative disc disease 02/24/2017   OAB (overactive bladder) 08/05/2016   Hyperlipidemia,  mixed 03/11/2016   BMI 29.0-29.9,adult 03/13/2015   Vitamin D deficiency 02/28/2014   Hypothyroidism 03/08/2013   Major depressive disorder 03/08/2013   Nuclear sclerotic cataract of both eyes 07/23/2012   Knee pain 09/26/2010   Hammer toe, acquired 03/15/2009   Past Surgical History:  Procedure Laterality Date   blephoroplasty     BROW LIFT     DILATION AND CURETTAGE OF UTERUS     SKIN CANCER EXCISION     right lower lip   TONSILLECTOMY AND ADENOIDECTOMY     OB History     Gravida  3   Para  2   Term  2   Preterm      AB      Living  2      SAB      IAB      Ectopic      Multiple       Live Births  2          Home Medications    Prior to Admission medications   Medication Sig Start Date End Date Taking? Authorizing Provider  Albuterol Sulfate (PROAIR RESPICLICK) 242 (90 Base) MCG/ACT AEPB Inhale 2 puffs every 4-6 hours as needed for cough and shortness of breath. 03/15/21  Yes Lynden Oxford Scales, PA-C  fluticasone (FLONASE) 50 MCG/ACT nasal spray Place 2 sprays into both nostrils daily. 03/15/21 04/14/21 Yes Lynden Oxford Scales, PA-C  ipratropium (ATROVENT) 0.06 % nasal spray Place 2 sprays into both nostrils 4 (four) times daily. 03/15/21  Yes Lynden Oxford Scales, PA-C  dextroamphetamine (DEXTROSTAT) 10 MG tablet Take 1 tablet (10 mg total) by mouth daily. 03/02/21   Mosie Lukes, MD  escitalopram (LEXAPRO) 10 MG tablet Take 1 tablet (10 mg total) by mouth daily. 01/23/21   Mosie Lukes, MD  GAMMA AMINOBUTYRIC ACID PO Take 200 mg by mouth as needed. Up to bid    [provider]  hydrocortisone (PROCTOZONE-HC) 2.5 % rectal cream Place 1 application rectally 2 (two) times daily. 03/11/16   Mosie Lukes, MD  Krill Oil 1000 MG CAPS  05/30/16   [provider]  Levothyroxine Sodium (TIROSINT) 100 MCG CAPS Take 1 capsule (100 mcg total) by mouth daily before breakfast. 03/06/21   Shamleffer, Melanie Crazier, MD  LORazepam (ATIVAN) 2 MG tablet Take 1 tablet (2 mg total) by mouth every 8 (eight) hours as needed for anxiety. 09/18/20   Mosie Lukes, MD  MAGNESIUM GLYCINATE PLUS PO Take 100 mg by mouth 2 (two) times daily.    [provider]  nystatin ointment (MYCOSTATIN)  03/18/19   [provider]   Family History Family History  Problem Relation Age of Onset   Colon polyps Mother    Parkinson's disease Mother        developed Parkinson's disease dementia   Diabetes Father    Emphysema Father    Tuberculosis Father    Alcohol abuse Sister    Cancer Sister        skin cancer, melanoma cell, basil cell   Cancer Sister         melanoma   Anxiety disorder Brother    Parkinson's disease Maternal Grandmother        developed Parkinson's disease dementia   Colon cancer Maternal Grandfather    ADD / ADHD Daughter    Anxiety disorder Daughter    Depression Daughter    ADD / ADHD Son    Anxiety disorder Son  Depression Son    Social History Social History   Tobacco Use   Smoking status: Former    Types: Cigarettes    Quit date: 08/20/1970    Years since quitting: 50.6   Smokeless tobacco: Never  Vaping Use   Vaping Use: Never used  Substance Use Topics   Alcohol use: Yes    Alcohol/week: 3.0 - 4.0 standard drinks    Types: 1 - 2 Glasses of wine, 1 Cans of beer, 1 Shots of liquor per week    Comment: not every night.   Drug use: No   Allergies   Hydrocodone-acetaminophen, Acetaminophen, Gluten meal, and Oxycodone  Review of Systems Review of Systems Pertinent findings noted in history of present illness.   Physical Exam Triage Vital Signs ED Triage Vitals  Enc Vitals Group     BP 02/23/21 0827 (!) 147/82     Pulse Rate 02/23/21 0827 72     Resp 02/23/21 0827 18     Temp 02/23/21 0827 98.3 F (36.8 C)     Temp Source 02/23/21 0827 Oral     SpO2 02/23/21 0827 98 %     Weight --      Height --      Head Circumference --      Peak Flow --      Pain Score 02/23/21 0826 5     Pain Loc --      Pain Edu? --      Excl. in [place]? --    No data found.  Updated Vital Signs BP (!) 143/87 (BP Location: Right Arm)   Pulse 82   Temp 99.6 F (37.6 C) (Oral)   Resp 18   LMP 03/29/2004   SpO2 95%   Visual Acuity Right Eye Distance:   Left Eye Distance:   Bilateral Distance:    Right Eye Near:   Left Eye Near:    Bilateral Near:     Physical Exam Vitals and nursing note reviewed.  Constitutional:      General: She is not in acute distress.    Appearance: Normal appearance. She is not ill-appearing.  HENT:     Head: Normocephalic and atraumatic.     Salivary Glands: Right  salivary gland is not diffusely enlarged or tender. Left salivary gland is not diffusely enlarged or tender.     Right Ear: Tympanic membrane, ear canal and external ear normal. No drainage. No middle ear effusion. There is no impacted cerumen. Tympanic membrane is not erythematous or bulging.     Left Ear: Tympanic membrane, ear canal and external ear normal. No drainage.  No middle ear effusion. There is no impacted cerumen. Tympanic membrane is not erythematous or bulging.     Nose: Rhinorrhea present. No nasal deformity, septal deviation, mucosal edema or congestion. Rhinorrhea is clear.     Right Turbinates: Not enlarged, swollen or pale.     Left Turbinates: Not enlarged, swollen or pale.     Right Sinus: No maxillary sinus tenderness or frontal sinus tenderness.     Left Sinus: No maxillary sinus tenderness or frontal sinus tenderness.     Mouth/Throat:     Lips: Pink. No lesions.     Mouth: Mucous membranes are moist. No oral lesions.     Pharynx: Oropharynx is clear. Uvula midline. No posterior oropharyngeal erythema or uvula swelling.     Tonsils: No tonsillar exudate. 0 on the right. 0 on the left.  Eyes:     General:  Lids are normal.        Right eye: No discharge.        Left eye: No discharge.     Extraocular Movements: Extraocular movements intact.     Conjunctiva/sclera: Conjunctivae normal.     Right eye: Right conjunctiva is not injected.     Left eye: Left conjunctiva is not injected.  Neck:     Trachea: Trachea and phonation normal.  Cardiovascular:     Rate and Rhythm: Normal rate and regular rhythm.     Pulses: Normal pulses.     Heart sounds: Normal heart sounds. No murmur heard.   No friction rub. No gallop.  Pulmonary:     Effort: Pulmonary effort is normal. No accessory muscle usage, prolonged expiration or respiratory distress.     Breath sounds: Normal breath sounds. No stridor, decreased air movement or transmitted upper airway sounds. No decreased breath  sounds, wheezing, rhonchi or rales.  Chest:     Chest wall: No tenderness.  Musculoskeletal:        General: Normal range of motion.     Cervical back: Normal range of motion and neck supple. Normal range of motion.  Lymphadenopathy:     Cervical: No cervical adenopathy.  Skin:    General: Skin is warm and dry.     Findings: No erythema or rash.  Neurological:     General: No focal deficit present.     Mental Status: She is alert and oriented to person, place, and time.  Psychiatric:        Mood and Affect: Mood normal.        Behavior: Behavior normal.   UC Treatments / Results  Labs (all labs ordered are listed, but only abnormal results are displayed)  Labs Reviewed  POCT INFLUENZA A/B    EKG  Radiology No results found.  Procedures Procedures (including critical care time)  Medications Ordered in UC Medications - No data to display  Initial Impression / Assessment and Plan / UC Course  I have reviewed the triage vital signs and the nursing notes.  Pertinent labs & imaging results that were available during my care of the patient were reviewed by me and considered in my medical decision making (see chart for details).      Influenza test today was negative.  Physical exam was unremarkable.  Patient had a mildly reduced oxygen saturation at 95%.  Given patient's history of exercise-induced bronchospasm and recent prescription for albuterol, I feel it would be wise to renew her albuterol prescription and have her use 2 puffs up to 4 times daily as needed for cough and increased work of breathing.  For her symptoms of chronic runny nose, I have given her prescription for Flonase that she should use daily as well as a prescription for Atrovent that she can use as needed.  Return precautions advised.  Final Clinical Impressions(s) / UC Diagnoses   Final diagnoses:  Cough, unspecified type  Acute nonintractable headache, unspecified headache type  Fever, unspecified   Perennial allergic rhinitis with seasonal variation     Discharge Instructions      Your rapid influenza test today is negative.  Conservative care is recommended.  This includes rest, clear fluids, cough drops and warm tea with honey, as you have already been doing.  For your chronic runny nose, recommend that you begin using Flonase nasal steroid daily.  Initially, I recommend that you spray 2 sprays into each nare daily for the first  3 to 5 days, then decrease to 1 spray in each nare daily.    For more rapid relief of your symptoms of runny nose, I also provided you with a prescription for a nasal spray called Atrovent which dry up mucous membranes more efficiently although it does not provide long-term benefit that Flonase will.  Therefore, I recommend that she use the Flonase in the morning and then 10 to 15 minutes later, if you want to use the Atrovent, 2 sprays in each nare, you can, and you can also use it up to 4 times every day as needed for runny nose.  I have also renewed your prescription for albuterol, please inhale 2 puffs every 4-6 hours as needed for shortness of breath and cough.  Thank you for visiting urgent care today.  Please follow-up early next week if you have not seen any improvement of your symptoms.     ED Prescriptions     Medication Sig Dispense Auth. Provider   fluticasone (FLONASE) 50 MCG/ACT nasal spray Place 2 sprays into both nostrils daily. 18 mL Lynden Oxford Scales, PA-C   ipratropium (ATROVENT) 0.06 % nasal spray Place 2 sprays into both nostrils 4 (four) times daily. 15 mL Lynden Oxford Scales, PA-C   Albuterol Sulfate (PROAIR RESPICLICK) 539 (90 Base) MCG/ACT AEPB Inhale 2 puffs every 4-6 hours as needed for cough and shortness of breath. 1 each Lynden Oxford Scales, PA-C      PDMP not reviewed this encounter.  Disposition Upon Discharge:  Patient presented with an acute illness with associated systemic symptoms and significant  discomfort requiring urgent management. In my opinion, this is a condition that a prudent lay person (someone who possesses an average knowledge of health and medicine) [place] potentially expect to result in complications if not addressed urgently such as respiratory distress, impairment of bodily function or dysfunction of bodily organs.   Routine symptom specific, illness specific and/or disease specific instructions were discussed with the patient and/or caregiver at length.   As such, the patient has been evaluated and assessed, work-up was performed and treatment was provided in alignment with urgent care protocols and evidence based medicine.  Patient/parent/caregiver has been advised that the patient Klee require follow up for further testing and treatment if the symptoms continue in spite of treatment, as clinically indicated and appropriate.  Patient/parent/caregiver has been advised to return to the [facility] or PCP in 3-5 days if no better; to PCP or the Emergency Department if new signs and symptoms develop, or if the current signs or symptoms continue to change or worsen for further workup, evaluation and treatment as clinically indicated and appropriate  The patient will follow up with their current PCP if and as advised. If the patient does not currently have a PCP we will assist them in obtaining one.   Patient/parent/caregiver verbalized understanding and agreement of plan as discussed.  All questions were addressed during visit.  Please see discharge instructions below for further details of plan.  Condition: stable for discharge home Home: take medications as prescribed; routine discharge instructions as discussed; follow up as advised.    Lynden Oxford Scales, PA-C 03/15/21 [phone]

## 2021-03-15 NOTE — Discharge Instructions (Addendum)
Your rapid influenza test today is negative.  Conservative care is recommended.  This includes rest, clear fluids, cough drops and warm tea with honey, as you have already been doing.  For your chronic runny nose, recommend that you begin using Flonase nasal steroid daily.  Initially, I recommend that you spray 2 sprays into each nare daily for the first 3 to 5 days, then decrease to 1 spray in each nare daily.    For more rapid relief of your symptoms of runny nose, I also provided you with a prescription for a nasal spray called Atrovent which dry up mucous membranes more efficiently although it does not provide long-term benefit that Flonase will.  Therefore, I recommend that she use the Flonase in the morning and then 10 to 15 minutes later, if you want to use the Atrovent, 2 sprays in each nare, you can, and you can also use it up to 4 times every day as needed for runny nose.  I have also renewed your prescription for albuterol, please inhale 2 puffs every 4-6 hours as needed for shortness of breath and cough.  Thank you for visiting urgent care today.  Please follow-up early next week if you have not seen any improvement of your symptoms.

## 2021-03-15 NOTE — ED Triage Notes (Signed)
Pt states she had Covid two week ago, she states last night she had a dry cough with chest pain (congestion related), fever. She states she feels like she has the flu.

## 2021-03-26 ENCOUNTER — Other Ambulatory Visit: Payer: Self-pay

## 2021-03-26 ENCOUNTER — Other Ambulatory Visit (INDEPENDENT_AMBULATORY_CARE_PROVIDER_SITE_OTHER): Payer: Medicare Other

## 2021-03-26 ENCOUNTER — Encounter: Payer: Self-pay | Admitting: Internal Medicine

## 2021-03-26 ENCOUNTER — Other Ambulatory Visit: Payer: Self-pay | Admitting: Internal Medicine

## 2021-03-26 DIAGNOSIS — E559 Vitamin D deficiency, unspecified: Secondary | ICD-10-CM | POA: Diagnosis not present

## 2021-03-26 DIAGNOSIS — E039 Hypothyroidism, unspecified: Secondary | ICD-10-CM

## 2021-03-26 LAB — TSH: TSH: 0.09 u[IU]/mL — ABNORMAL LOW (ref 0.35–5.50)

## 2021-03-26 LAB — VITAMIN D 25 HYDROXY (VIT D DEFICIENCY, FRACTURES): VITD: 76.39 ng/mL (ref 30.00–100.00)

## 2021-03-27 DIAGNOSIS — M542 Cervicalgia: Secondary | ICD-10-CM | POA: Diagnosis not present

## 2021-03-28 DIAGNOSIS — S0502XA Injury of conjunctiva and corneal abrasion without foreign body, left eye, initial encounter: Secondary | ICD-10-CM | POA: Diagnosis not present

## 2021-03-29 DIAGNOSIS — Z20822 Contact with and (suspected) exposure to covid-19: Secondary | ICD-10-CM | POA: Diagnosis not present

## 2021-04-02 ENCOUNTER — Ambulatory Visit: Payer: Medicare Other | Admitting: Family Medicine

## 2021-04-04 ENCOUNTER — Ambulatory Visit: Payer: Medicare Other | Admitting: Obstetrics and Gynecology

## 2021-04-04 NOTE — Progress Notes (Deleted)
[facility]   Subjective:     Chief Complaint: No chief complaint on file.  History of Present Illness: Shannon [name] is a 70 y.o. female with stage III pelvic organ prolapse who presents today for a pessary fitting.    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), Anxiety, Asthma, Basal cell carcinoma of face, Benign neoplasm of skin of lower limb, Bilateral carotid bruits (04/12/2019), Diverticulosis, Gestational diabetes, Hammer toe, acquired (03/15/2009), Heart murmur (03/02/2018), History of COVID-19 (09/14/2020), Hyperglycemia (02/24/2017), Hyperlipidemia, mixed (03/11/2016), Hypothyroidism, Knee pain (09/26/2010), Lead exposure (03/02/2018), Lumbar degenerative disc disease (02/24/2017), Major depressive disorder (03/08/2013), Melanocytic nevi of trunk, Menopause, Neck pain on left side (09/10/2018), Neurofibroma (07/20/2019), Nuclear sclerotic cataract of both eyes (07/23/2012), OAB (overactive bladder) (08/05/2016), Osteopenia, Rosacea, Sciatica of left side (02/24/2017), 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   Past Surgical History: She  has a past surgical history that includes Tonsillectomy and adenoidectomy; Dilation and curettage of uterus; blephoroplasty; Brow lift; and Skin cancer excision.   Medications: She has a current medication list which includes the following prescription(s): proair respiclick, dextroamphetamine, escitalopram, fluticasone, gamma-aminobutyric acid, hydrocortisone, ipratropium, krill oil, levothyroxine sodium, lorazepam, magnesium, and nystatin ointment.   Allergies: Patient is allergic to hydrocodone-acetaminophen, acetaminophen, gluten meal, and oxycodone.   Social History: Patient  reports that she quit smoking about 50 years ago. Her smoking  use included cigarettes. She has never used smokeless tobacco. She reports current alcohol use of about 3.0 - 4.0 standard drinks per week. She reports that she does not use drugs.      Objective:    LMP 03/29/2004  Gen: No apparent distress, A&O x 3. Pelvic Exam: Normal external female genitalia; Bartholin's and Skene's glands normal in appearance; urethral meatus {urethra:24773}, no urethral masses or discharge.   A size *** {pessary type:24772} pessary was fitted. It was comfortable, stayed in place with valsalva and was an appropriate size on examination, with one finger fitting between the pessary and the vaginal walls. We tied a string to it and the patient demonstrated proper removal and replacement.   POP-Q (02/14/21):    POP-Q   -2.5                                            Aa   -2.5                                           Ba   -7.5                                              C    4                                            Gh   3  Pb   9.5                                            tvl    2                                            Ap   2                                            Bp   -8.5                                              D     Assessment/Plan:    Assessment: [name] is a 70 y.o. with {PFD symptoms:24771} who presents for a pessary fitting. Plan: She was fitted with a *** {pessary type:24772} pessary. She will {pessary plan:24776}. She will {use:24778} {lubricant:24777}.   Follow-up in *** {days/wks/mos/yrs:310907} for a pessary check or sooner as needed.  All questions were answered.    Jaquita Folds, MD

## 2021-04-09 ENCOUNTER — Other Ambulatory Visit: Payer: Self-pay

## 2021-04-09 ENCOUNTER — Encounter: Payer: Self-pay | Admitting: Obstetrics and Gynecology

## 2021-04-09 ENCOUNTER — Ambulatory Visit (INDEPENDENT_AMBULATORY_CARE_PROVIDER_SITE_OTHER): Payer: Medicare Other | Admitting: Obstetrics and Gynecology

## 2021-04-09 VITALS — BP 118/77 | HR 67 | Wt 168.0 lb

## 2021-04-09 DIAGNOSIS — N816 Rectocele: Secondary | ICD-10-CM | POA: Diagnosis not present

## 2021-04-09 DIAGNOSIS — N393 Stress incontinence (female) (male): Secondary | ICD-10-CM

## 2021-04-09 NOTE — Progress Notes (Signed)
[facility]   Subjective:     Chief Complaint: No chief complaint on file.  History of Present Illness: Shannon [name] is a 69 y.o. female with stage II pelvic organ prolapse and stress incontinence who presents today for a pessary fitting.    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), Anxiety, Asthma, Basal cell carcinoma of face, Benign neoplasm of skin of lower limb, Bilateral carotid bruits (04/12/2019), Diverticulosis, Gestational diabetes, Hammer toe, acquired (03/15/2009), Heart murmur (03/02/2018), History of COVID-19 (09/14/2020), Hyperglycemia (02/24/2017), Hyperlipidemia, mixed (03/11/2016), Hypothyroidism, Knee pain (09/26/2010), Lead exposure (03/02/2018), Lumbar degenerative disc disease (02/24/2017), Major depressive disorder (03/08/2013), Melanocytic nevi of trunk, Menopause, Neck pain on left side (09/10/2018), Neurofibroma (07/20/2019), Nuclear sclerotic cataract of both eyes (07/23/2012), OAB (overactive bladder) (08/05/2016), Osteopenia, Rosacea, Sciatica of left side (02/24/2017), 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   Past Surgical History: She  has a past surgical history that includes Tonsillectomy and adenoidectomy; Dilation and curettage of uterus; blephoroplasty; Brow lift; and Skin cancer excision.   Medications: She has a current medication list which includes the following prescription(s): proair respiclick, dextroamphetamine, escitalopram, fluticasone, gamma-aminobutyric acid, hydrocortisone, krill oil, levothyroxine sodium, lorazepam, magnesium, and nystatin ointment.   Allergies: Patient is allergic to hydrocodone-acetaminophen, acetaminophen, gluten meal, and oxycodone.   Social History: Patient  reports that she quit smoking about 50 years ago. Her  smoking use included cigarettes. She has never used smokeless tobacco. She reports current alcohol use of about 3.0 - 4.0 standard drinks per week. She reports that she does not use drugs.      Objective:    BP 118/77   Pulse 67   Wt 168 lb (76.2 kg)   LMP 03/29/2004   BMI 27.96 kg/m  Gen: No apparent distress, A&O x 3. Pelvic Exam: Normal external female genitalia; Bartholin's and Skene's glands normal in appearance; urethral meatus normal in appearance, no urethral masses or discharge.   A size #3 incontinence ring with support pessary was fitted. It was comfortable, stayed in place with valsalva and was an appropriate size on examination, with one finger fitting between the pessary and the vaginal walls. She could feel it slightly with walking so we tried a #2 iRWS but this did not support her prolapse with valsalva. We kept the #3 iRWS (Lot# [id number])  POP-Q (02/14/21):    POP-Q   -2.5                                            Aa   -2.5                                           Ba   -7.5                                              C    4  Gh   3                                            Pb   9.5                                            tvl    2                                            Ap   2                                            Bp   -8.5                                              D          Assessment/Plan:    Assessment: Ms. Torrens is a 70 y.o. with stage II pelvic organ prolapse and stress incontinence who presents for a pessary fitting.  Plan: She was fitted with a #3 incontinence ring with support pessary. She will keep the pessary in place until next visit.   Follow-up in 2-3 weeks for a pessary check or sooner as needed.  All questions were answered.    Jaquita Folds, MD

## 2021-04-10 DIAGNOSIS — M542 Cervicalgia: Secondary | ICD-10-CM | POA: Diagnosis not present

## 2021-04-10 NOTE — Telephone Encounter (Signed)
Pt was called back re: pessary fell out. Pt was offered 2 appts on 04/11/21 @ 4pm and on Tuesday  @11 :20am pt declined. Pt will keep her next f/u appt.

## 2021-04-11 DIAGNOSIS — K045 Chronic apical periodontitis: Secondary | ICD-10-CM | POA: Diagnosis not present

## 2021-04-12 ENCOUNTER — Ambulatory Visit: Payer: Medicare Other | Admitting: Family Medicine

## 2021-04-12 DIAGNOSIS — L578 Other skin changes due to chronic exposure to nonionizing radiation: Secondary | ICD-10-CM | POA: Diagnosis not present

## 2021-04-12 DIAGNOSIS — D2361 Other benign neoplasm of skin of right upper limb, including shoulder: Secondary | ICD-10-CM | POA: Diagnosis not present

## 2021-04-12 DIAGNOSIS — D2371 Other benign neoplasm of skin of right lower limb, including hip: Secondary | ICD-10-CM | POA: Diagnosis not present

## 2021-04-12 DIAGNOSIS — Z86008 Personal history of in-situ neoplasm of other site: Secondary | ICD-10-CM | POA: Diagnosis not present

## 2021-04-12 DIAGNOSIS — D225 Melanocytic nevi of trunk: Secondary | ICD-10-CM | POA: Diagnosis not present

## 2021-04-12 DIAGNOSIS — D2272 Melanocytic nevi of left lower limb, including hip: Secondary | ICD-10-CM | POA: Diagnosis not present

## 2021-04-12 DIAGNOSIS — D2261 Melanocytic nevi of right upper limb, including shoulder: Secondary | ICD-10-CM | POA: Diagnosis not present

## 2021-04-12 DIAGNOSIS — D2271 Melanocytic nevi of right lower limb, including hip: Secondary | ICD-10-CM | POA: Diagnosis not present

## 2021-04-12 DIAGNOSIS — L570 Actinic keratosis: Secondary | ICD-10-CM | POA: Diagnosis not present

## 2021-04-12 DIAGNOSIS — Z86018 Personal history of other benign neoplasm: Secondary | ICD-10-CM | POA: Diagnosis not present

## 2021-04-12 DIAGNOSIS — L84 Corns and callosities: Secondary | ICD-10-CM | POA: Diagnosis not present

## 2021-04-16 ENCOUNTER — Ambulatory Visit (INDEPENDENT_AMBULATORY_CARE_PROVIDER_SITE_OTHER): Payer: Medicare Other | Admitting: Family Medicine

## 2021-04-16 ENCOUNTER — Encounter: Payer: Self-pay | Admitting: Family Medicine

## 2021-04-16 DIAGNOSIS — E559 Vitamin D deficiency, unspecified: Secondary | ICD-10-CM | POA: Diagnosis not present

## 2021-04-16 DIAGNOSIS — J300 Vasomotor rhinitis: Secondary | ICD-10-CM | POA: Diagnosis not present

## 2021-04-16 DIAGNOSIS — E782 Mixed hyperlipidemia: Secondary | ICD-10-CM | POA: Diagnosis not present

## 2021-04-16 DIAGNOSIS — F900 Attention-deficit hyperactivity disorder, predominantly inattentive type: Secondary | ICD-10-CM | POA: Diagnosis not present

## 2021-04-16 DIAGNOSIS — E039 Hypothyroidism, unspecified: Secondary | ICD-10-CM | POA: Diagnosis not present

## 2021-04-16 DIAGNOSIS — N3281 Overactive bladder: Secondary | ICD-10-CM

## 2021-04-16 DIAGNOSIS — R739 Hyperglycemia, unspecified: Secondary | ICD-10-CM

## 2021-04-16 DIAGNOSIS — Z8616 Personal history of COVID-19: Secondary | ICD-10-CM

## 2021-04-16 DIAGNOSIS — Z0000 Encounter for general adult medical examination without abnormal findings: Secondary | ICD-10-CM

## 2021-04-16 NOTE — Assessment & Plan Note (Signed)
Traditional Medicare will not cover CPE but she agrees to return for AWV and her screening an immunizations were reviewed.

## 2021-04-16 NOTE — Progress Notes (Signed)
Subjective:   By signing my name below, I, Shannon [name], attest that this documentation has been prepared under the direction and in the presence of Shannon Lukes, MD. 04/16/2021   Patient ID: Shannon [name], female    DOB: [birth year], 70 y.o.   MRN: [id number]  Chief Complaint  Patient presents with   Annual Exam    HPI Patient is in today for a comprehensive physical exam.  She went to a neuropsychologist and passed the cognitive test. She was told she was higher than average.   She started using a pessary to manage rectocele and when she had a bowel movement, the pessary fell out. She is still considering surgery. Still having problems with bowel movements.  She stopped taking myrbetriq.   She stopped taking desiccated thyroid because she was told she cannot combine it with 100 mcg tirosint.She recently started using tirosint only and it is still too early to tell if it is making a difference.  She had gone to the ED on 03/15/2021 for a cough and abdominal pain and was diagnosed with non-specific rhinitis. She was given Flonase and albuterol. She also used mucinex which provided great relief. The fever and cough have resolved. She adds there is still phlegm in her throat and sometimes feels a tightness in her chest. She also feels out of breath sometimes when climbing the stairs.   She denies fever, , eye pain, chest pain, palpitations, leg swelling, shortness of breath, nausea, abdominal pain, diarrhea and blood in stool. Also denies frequency, back pain and headaches.   She goes to the gym 1-2x a week.  She is UTD on dental and vision care.   She has received the flu vaccine. She had Covid-19 in November 2022  There has been no changes in family medical history.   Past Medical History:  Diagnosis Date   ADHD (attention deficit hyperactivity disorder), 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   Anxiety    Asthma    Basal cell carcinoma of face    Benign neoplasm of skin of lower limb     Bilateral carotid bruits 04/12/2019   Diverticulosis    Gestational diabetes    Hammer toe, acquired 03/15/2009   Heart murmur 03/02/2018   History of COVID-19 09/14/2020   Hyperglycemia 02/24/2017   Hyperlipidemia, mixed 03/11/2016   Hypothyroidism    Knee pain 09/26/2010   Crepitation on the right and minimal on the left. Left torn meniscus Right arthritis   Lead exposure 03/02/2018   Lumbar degenerative disc disease 02/24/2017   MRI 2019 confirms multilevel change  Sciatica at times 2/2 this and periodic radiculopathy   Major depressive disorder 03/08/2013   Melanocytic nevi of trunk    Menopause    Neck pain on left side 09/10/2018   Suspect this is DDD and DJD - XR is pending Neurapraxic spasm to left trapezius   Neurofibroma 07/20/2019   Nuclear sclerotic cataract of both eyes 07/23/2012   OAB (overactive bladder) 08/05/2016   Osteopenia    Rosacea    Sciatica of left side 02/24/2017   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   By exam she has chronic tendinopathy of Glut Med   Urinary urgency 09/17/2020   Vertigo 04/12/2019   Vitamin D deficiency 02/28/2014    Past Surgical History:  Procedure Laterality Date   blephoroplasty     BROW LIFT     DILATION AND CURETTAGE OF UTERUS     SKIN CANCER EXCISION  right lower lip   TONSILLECTOMY AND ADENOIDECTOMY      Family History  Problem Relation Age of Onset   Colon polyps Mother    Parkinson's disease Mother        developed Parkinson's disease dementia   Diabetes Father    Emphysema Father    Tuberculosis Father    Alcohol abuse Sister    Cancer Sister        skin cancer, melanoma cell, basil cell   Cancer Sister        melanoma   Anxiety disorder Brother    Parkinson's disease Maternal Grandmother        developed Parkinson's disease dementia   Colon cancer Maternal Grandfather    ADD / ADHD Daughter    Anxiety disorder Daughter    Depression Daughter    ADD / ADHD Son    Anxiety disorder Son     Depression Son     Social History   Socioeconomic History   Marital status: Single    Spouse name: Not on file   Number of children: 2   Years of education: 18   Highest education level: Master's degree (e.g., MA, MS, MEng, MEd, MSW, MBA)  Occupational History   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   Drug use: No   Sexual activity: Not Currently  Other Topics Concern   Not on file  Social History Narrative   Retired from [employer].   No major dietary restrictions except avoid gluten.       Social Determinants of Health   Financial Resource Strain: Low Risk    Difficulty of Paying Living Expenses: Not hard at all  Food Insecurity: No Food Insecurity   Worried About Charity fundraiser in the Last Year: Never true   [place] in the Last Year: Never true  Transportation Needs: No Transportation Needs   Lack of Transportation (Medical): No   Lack of Transportation (Non-Medical): No  Physical Activity: Insufficiently Active   Days of Exercise per Week: 2 days   Minutes of Exercise per Session: 50 min  Stress: No Stress Concern Present   Feeling of Stress : Not at all  Social Connections: Moderately Integrated   Frequency of Communication with Friends and Family: More than three times a week   Frequency of Social Gatherings with Friends and Family: More than three times a week   Attends Religious Services: More than 4 times per year   Active Member of [employer] or Organizations: Yes   Attends Music therapist: More than 4 times per year   Marital  Status: Divorced  Human resources officer Violence: Not At Risk   Fear of Current or Ex-Partner: No   Emotionally Abused: No   Physically Abused: No   Sexually Abused: No    Outpatient Medications Prior to Visit  Medication Sig Dispense Refill   Albuterol Sulfate (PROAIR RESPICLICK) 628 (90 Base) MCG/ACT AEPB Inhale 2 puffs every 4-6  hours as needed for cough and shortness of breath. 1 each 0   dextroamphetamine (DEXTROSTAT) 10 MG tablet Take 1 tablet (10 mg total) by mouth daily. 30 tablet 0   escitalopram (LEXAPRO) 10 MG tablet Take 1 tablet (10 mg total) by mouth daily. 90 tablet 1   fluticasone (FLONASE) 50 MCG/ACT nasal spray Place 2 sprays into both nostrils daily. 18 mL 0   GAMMA AMINOBUTYRIC ACID PO Take 200 mg by mouth as needed. Up to bid     hydrocortisone (PROCTOZONE-HC) 2.5 % rectal cream Place 1 application rectally 2 (two) times daily. 30 g 0   Krill Oil 1000 MG CAPS      Levothyroxine Sodium (TIROSINT) 100 MCG CAPS Take 1 capsule (100 mcg total) by mouth daily before breakfast. 90 capsule 1   LORazepam (ATIVAN) 2 MG tablet Take 1 tablet (2 mg total) by mouth every 8 (eight) hours as needed for anxiety. 40 tablet 0   MAGNESIUM GLYCINATE PLUS PO Take 100 mg by mouth 2 (two) times daily.     nystatin ointment (MYCOSTATIN)      No facility-administered medications prior to visit.    Allergies  Allergen Reactions   Hydrocodone-Acetaminophen Nausea And Vomiting   Gluten Meal Other (See Comments)    GI symptoms; has BM everytime she urinates GI symptoms; has BM everytime she urinates   Oxycodone Nausea And Vomiting    Review of Systems  Constitutional:  Negative for fever and malaise/fatigue.  HENT:  Positive for congestion.   Eyes:  Negative for redness.  Respiratory:  Positive for shortness of breath. Negative for cough.   Cardiovascular:  Negative for chest pain, palpitations and leg swelling.  Gastrointestinal:  Negative for abdominal pain, blood in stool and nausea.   Genitourinary:  Negative for dysuria and frequency.  Musculoskeletal:  Negative for falls.  Skin:  Negative for rash.  Neurological:  Negative for dizziness, loss of consciousness and headaches.  Endo/Heme/Allergies:  Negative for polydipsia.  Psychiatric/Behavioral:  Negative for depression. The patient is not nervous/anxious.       Objective:    Physical Exam Constitutional:      General: She is not in acute distress.    Appearance: She is well-developed.  HENT:     Head: Normocephalic and atraumatic.     Right Ear: Tympanic membrane, ear canal and external ear normal.     Left Ear: Tympanic membrane, ear canal and external ear normal.  Eyes:     Extraocular Movements:     Right eye: No nystagmus.     Left eye: No nystagmus.     Conjunctiva/sclera: Conjunctivae normal.  Neck:     Thyroid: No thyromegaly.  Cardiovascular:     Rate and Rhythm: Normal rate and regular rhythm.     Heart sounds: Normal heart sounds. No murmur heard. Pulmonary:     Effort: Pulmonary effort is normal. No respiratory distress.     Breath sounds: Normal breath sounds.  Abdominal:     General: Bowel sounds are normal. There is no distension.     Palpations: Abdomen is soft. There is no mass.     Tenderness: There is no abdominal tenderness.  Musculoskeletal:     Cervical back: Neck supple.     Comments: 5/5 strength in upper and lower extremities   Lymphadenopathy:     Cervical: No cervical adenopathy.  Skin:    General: Skin is warm and dry.  Neurological:     Mental Status: She is alert  and oriented to person, place, and time.     Deep Tendon Reflexes:     Reflex Scores:      Patellar reflexes are 2+ on the right side and 2+ on the left side. Psychiatric:        Behavior: Behavior normal.    BP 104/64    Pulse (!) 58    Temp (!) 97.5 F (36.4 C)    Resp 16    Ht 5\' 5"  (1.651 m)    Wt 170 lb 12.8 oz (77.5 kg)    LMP 03/29/2004    SpO2 97%    BMI 28.42 kg/m  Wt Readings from Last 3  Encounters:  04/16/21 170 lb 12.8 oz (77.5 kg)  04/09/21 168 lb (76.2 kg)  03/08/21 168 lb (76.2 kg)    Diabetic Foot Exam - Simple   No data filed    Lab Results  Component Value Date   WBC 4.3 01/25/2021   HGB 14.0 01/25/2021   HCT 41.3 01/25/2021   PLT 231.0 01/25/2021   GLUCOSE 70 01/25/2021   CHOL 183 01/25/2021   TRIG 89.0 01/25/2021   HDL 52.30 01/25/2021   LDLCALC 113 (H) 01/25/2021   ALT 14 01/25/2021   AST 22 01/25/2021   NA 138 01/25/2021   K 4.0 01/25/2021   CL 104 01/25/2021   CREATININE 0.99 01/25/2021   BUN 20 01/25/2021   CO2 28 01/25/2021   TSH 0.09 (L) 03/26/2021   HGBA1C 5.4 01/25/2021   MICROALBUR 0.2 02/28/2014    Lab Results  Component Value Date   TSH 0.09 (L) 03/26/2021   Lab Results  Component Value Date   WBC 4.3 01/25/2021   HGB 14.0 01/25/2021   HCT 41.3 01/25/2021   MCV 98.3 01/25/2021   PLT 231.0 01/25/2021   Lab Results  Component Value Date   NA 138 01/25/2021   K 4.0 01/25/2021   CO2 28 01/25/2021   GLUCOSE 70 01/25/2021   BUN 20 01/25/2021   CREATININE 0.99 01/25/2021   BILITOT 0.7 01/25/2021   ALKPHOS 64 01/25/2021   AST 22 01/25/2021   ALT 14 01/25/2021   PROT 6.8 01/25/2021   ALBUMIN 4.1 01/25/2021   CALCIUM 9.2 01/25/2021   GFR 57.83 (L) 01/25/2021   Lab Results  Component Value Date   CHOL 183 01/25/2021   Lab Results  Component Value Date   HDL 52.30 01/25/2021   Lab Results  Component Value Date   LDLCALC 113 (H) 01/25/2021   Lab Results  Component Value Date   TRIG 89.0 01/25/2021   Lab Results  Component Value Date   CHOLHDL 3 01/25/2021   Lab Results  Component Value Date   HGBA1C 5.4 01/25/2021       Assessment & Plan:   Problem List Items Addressed This Visit     Hypothyroidism    Her levothyroxine was recently lowered due to a suppressed TSH will wait on repeat results. Following with endocrinology      Vitamin D deficiency    Supplement and monitor      Hyperlipidemia,  mixed    Encourage heart healthy diet such as MIND or DASH diet, increase exercise, avoid trans fats, simple carbohydrates and processed foods, consider a krill or fish or flaxseed oil cap daily.       Preventative health care    Traditional Medicare will not cover CPE but she agrees to return for AWV and her screening an immunizations were reviewed.  OAB (overactive bladder)    She has seen urogynecology at [facility], they stopped her Myrebetriq due to concerns regarding dementia. They have tried a pessary. It helped some but came out she is having it replaced and she is ready to learn how to replace.       Hyperglycemia    hgba1c acceptable, minimize simple carbs. Increase exercise as tolerated.      History of COVID-19    Has had a couple of episodes of COVID now and another virus this year. She notes increased congestion and respiratory symptoms of cough, sob etc. Use Albuterol prn. Notify us if worsens.       ADHD (attention deficit hyperactivity disorder), inattentive type    Doing well on current meds.       Vasomotor rhinitis    Consider Astelin but she declines for now        No orders of the defined types were placed in this encounter.   I,Shannon [name],acting as a Education administrator for Penni Homans, MD.,have documented all relevant documentation on the behalf of Penni Homans, MD,as directed by  Penni Homans, MD while in the presence of Penni Homans, MD.   I, Shannon Lukes, MD. , personally preformed the services described in this documentation.  All medical record entries made by the scribe were at my direction and in my presence.  I have reviewed the chart and discharge instructions (if applicable) and agree that the record reflects my personal performance and is accurate and complete. 04/16/2021

## 2021-04-16 NOTE — Assessment & Plan Note (Signed)
Encourage heart healthy diet such as MIND or DASH diet, increase exercise, avoid trans fats, simple carbohydrates and processed foods, consider a krill or fish or flaxseed oil cap daily.  °

## 2021-04-16 NOTE — Assessment & Plan Note (Signed)
Supplement and monitor 

## 2021-04-16 NOTE — Assessment & Plan Note (Signed)
She has seen urogynecology at [facility], they stopped her Myrebetriq due to concerns regarding dementia. They have tried a pessary. It helped some but came out she is having it replaced and she is ready to learn how to replace.

## 2021-04-16 NOTE — Patient Instructions (Signed)
Preventive Care 65 Years and Older, Female °Preventive care refers to lifestyle choices and visits with your health care provider that can promote health and wellness. Preventive care visits are also called wellness exams. °What can I expect for my preventive care visit? °Counseling °Your health care provider Manju ask you questions about your: °Medical history, including: °Past medical problems. °Family medical history. °Pregnancy and menstrual history. °History of falls. °Current health, including: °Memory and ability to understand (cognition). °Emotional well-being. °Home life and relationship well-being. °Sexual activity and sexual health. °Lifestyle, including: °Alcohol, nicotine or tobacco, and drug use. °Access to firearms. °Diet, exercise, and sleep habits. °Work and work environment. °Sunscreen use. °Safety issues such as seatbelt and bike helmet use. °Physical exam °Your health care provider will check your: °Height and weight. These R[place]anda be used to calculate your BMI (body mass index). BMI is a measurement that tells if you are at a healthy weight. °Waist circumference. This measures the distance around your waistline. This measurement also tells if you are at a healthy weight and Deyona help predict your risk of certain diseases, such as type 2 diabetes and high blood pressure. °Heart rate and blood pressure. °Body temperature. °Skin for abnormal spots. °What immunizations do I need? °Vaccines are usually given at various ages, according to a schedule. Your health care provider will recommend vaccines for you based on your age, medical history, and lifestyle or other factors, such as travel or where you work. °What tests do I need? °Screening °Your health care provider Tomoko recommend screening tests for certain conditions. This [name] include: °Lipid and cholesterol levels. °Hepatitis C test. °Hepatitis B test. °HIV (human immunodeficiency virus) test. °STI (sexually transmitted infection) testing, if you are at  risk. °Lung cancer screening. °Colorectal cancer screening. °Diabetes screening. This is done by checking your blood sugar (glucose) after you have not eaten for a while (fasting). °Mammogram. Talk with your health care provider about how often you should have regular mammograms. °BRCA-related cancer screening. This Sala be done if you have a family history of breast, ovarian, tubal, or peritoneal cancers. °Bone density scan. This is done to screen for osteoporosis. °Talk with your health care provider about your test results, treatment options, and if necessary, the need for more tests. °Follow these instructions at home: °Eating and drinking ° °Eat a diet that includes fresh fruits and vegetables, whole grains, lean protein, and low-fat dairy products. Limit your intake of foods with high amounts of sugar, saturated fats, and salt. °Take vitamin and mineral supplements as recommended by your health care provider. °Do not drink alcohol if your health care provider tells you not to drink. °If you drink alcohol: °Limit how much you have to 0-1 drink a day. °Know how much alcohol is in your drink. In the [place] one drink equals one 12 oz bottle of beer (355 mL), one 5 oz glass of wine (148 mL), or one 1½ oz glass of hard liquor (44 mL). °Lifestyle °Brush your teeth every morning and night with fluoride toothpaste. Floss one time each day. °Exercise for at least 30 minutes 5 or more days each week. °Do not use any products that contain nicotine or tobacco. These products include cigarettes, chewing tobacco, and vaping devices, such as e-cigarettes. If you need help quitting, ask your health care provider. °Do not use drugs. °If you are sexually active, practice safe sex. Use a condom or other form of protection in order to prevent STIs. °Take aspirin only as told by your   health care provider. Make sure that you understand how much to take and what form to take. Work with your health care provider to find out whether it  is safe and beneficial for you to take aspirin daily. Ask your health care provider if you need to take a cholesterol-lowering medicine (statin). Find healthy ways to manage stress, such as: Meditation, yoga, or listening to music. Journaling. Talking to a trusted person. Spending time with friends and family. Minimize exposure to UV radiation to reduce your risk of skin cancer. Safety Always wear your seat belt while driving or riding in a vehicle. Do not drive: If you have been drinking alcohol. Do not ride with someone who has been drinking. When you are tired or distracted. While texting. If you have been using any mind-altering substances or drugs. Wear a helmet and other protective equipment during sports activities. If you have firearms in your house, make sure you follow all gun safety procedures. What's next? Visit your health care provider once a year for an annual wellness visit. Ask your health care provider how often you should have your eyes and teeth checked. Stay up to date on all vaccines. This information is not intended to replace advice given to you by your health care provider. Make sure you discuss any questions you have with your health care provider. Document Revised: 10/11/2020 Document Reviewed: 10/11/2020 Elsevier Patient Education  [place].

## 2021-04-16 NOTE — Assessment & Plan Note (Signed)
Has had a couple of episodes of COVID now and another virus this year. She notes increased congestion and respiratory symptoms of cough, sob etc. Use Albuterol prn. Notify us if worsens.

## 2021-04-16 NOTE — Assessment & Plan Note (Signed)
hgba1c acceptable, minimize simple carbs. Increase exercise as tolerated.  

## 2021-04-16 NOTE — Assessment & Plan Note (Signed)
Consider Astelin but she declines for now

## 2021-04-16 NOTE — Assessment & Plan Note (Signed)
Her levothyroxine was recently lowered due to a suppressed TSH will wait on repeat results. Following with endocrinology

## 2021-04-16 NOTE — Progress Notes (Signed)
[facility]   Subjective:     Chief Complaint: Pessary Check  History of Present Illness: Shannon [name] is a 70 y.o. female with stage II pelvic organ prolapse and stress incontinence. Last visit she was fit with a #3 incontinence ring with support pessary. This fell out with a bowel movement.   She is using psyllium for constipation. Previously tried miralax but was going too often (several times per day) with this.   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), Anxiety, Asthma, Basal cell carcinoma of face, Benign neoplasm of skin of lower limb, Bilateral carotid bruits (04/12/2019), Diverticulosis, Gestational diabetes, Hammer toe, acquired (03/15/2009), Heart murmur (03/02/2018), History of COVID-19 (09/14/2020), Hyperglycemia (02/24/2017), Hyperlipidemia, mixed (03/11/2016), Hypothyroidism, Knee pain (09/26/2010), Lead exposure (03/02/2018), Lumbar degenerative disc disease (02/24/2017), Major depressive disorder (03/08/2013), Melanocytic nevi of trunk, Menopause, Neck pain on left side (09/10/2018), Neurofibroma (07/20/2019), Nuclear sclerotic cataract of both eyes (07/23/2012), OAB (overactive bladder) (08/05/2016), Osteopenia, Rosacea, Sciatica of left side (02/24/2017), 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   Past Surgical History: She  has a past surgical history that includes Tonsillectomy and adenoidectomy; Dilation and curettage of uterus; blephoroplasty; Brow lift; and Skin cancer excision.   Medications: She has a current medication list which includes the following prescription(s): proair respiclick, dextroamphetamine, escitalopram, gamma-aminobutyric acid, hydrocortisone, krill oil, levothyroxine sodium, lorazepam, magnesium, and nystatin ointment.   Allergies: Patient  is allergic to hydrocodone-acetaminophen, gluten meal, and oxycodone.   Social History: Patient  reports that she quit smoking about 50 years ago. Her smoking use included cigarettes. She has never used smokeless tobacco. She reports current alcohol use of about 3.0 - 4.0 standard drinks per week. She reports that she does not use drugs.      Objective:    BP 115/77    Pulse 61    LMP 03/29/2004  Gen: No apparent distress, A&O x 3. Pelvic Exam: Normal external female genitalia; Bartholin's and Skene's glands normal in appearance; urethral meatus normal in appearance, no urethral masses or discharge.   A size #2-1/4 gellhorn pessary was fitted. It was comfortable, stayed in place with valsalva, walking and bending.  It was an appropriate size on examination, with one finger fitting between the pessary and the vaginal walls.  (Lot# [id number], Exp 01/31/26  POP-Q (02/14/21):    POP-Q   -2.5                                            Aa   -2.5                                           Ba   -7.5                                              C    4  Gh   3                                            Pb   9.5                                            tvl    2                                            Ap   2                                            Bp   -8.5                                              D          Assessment/Plan:    Assessment: Shannon [name] is a 70 y.o. with stage II pelvic organ prolapse and constipation who presents for a pessary fitting.  Plan: - She was fitted with a 2-1/4in ss gellhorn pessary. She will keep the pessary in place until next visit.  - She is going to use the psyllium twice a day. Also advised she can halve the dose of miralax or use it less frequently.   Follow-up in 2-3 weeks for a pessary check or sooner as needed.  All questions were answered.    Jaquita Folds, MD  Time spent: I spent  15 minutes dedicated to the care of this patient on the date of this encounter to include pre-visit review of records, face-to-face time with the patient and post visit documentation. Additional time was spent on the pessary fitting.

## 2021-04-16 NOTE — Assessment & Plan Note (Signed)
Doing well on current meds.

## 2021-04-17 ENCOUNTER — Other Ambulatory Visit: Payer: Self-pay

## 2021-04-17 ENCOUNTER — Encounter: Payer: Self-pay | Admitting: Obstetrics and Gynecology

## 2021-04-17 ENCOUNTER — Ambulatory Visit (INDEPENDENT_AMBULATORY_CARE_PROVIDER_SITE_OTHER): Payer: Medicare Other | Admitting: Obstetrics and Gynecology

## 2021-04-17 VITALS — BP 115/77 | HR 61

## 2021-04-17 DIAGNOSIS — N816 Rectocele: Secondary | ICD-10-CM

## 2021-04-18 DIAGNOSIS — M542 Cervicalgia: Secondary | ICD-10-CM | POA: Diagnosis not present

## 2021-04-29 DIAGNOSIS — Z20822 Contact with and (suspected) exposure to covid-19: Secondary | ICD-10-CM | POA: Diagnosis not present

## 2021-04-30 ENCOUNTER — Encounter: Payer: Self-pay | Admitting: Family Medicine

## 2021-05-02 ENCOUNTER — Ambulatory Visit: Payer: Medicare Other | Admitting: Obstetrics and Gynecology

## 2021-05-03 ENCOUNTER — Ambulatory Visit: Payer: Medicare Other | Admitting: Obstetrics and Gynecology

## 2021-05-10 DIAGNOSIS — M542 Cervicalgia: Secondary | ICD-10-CM | POA: Diagnosis not present

## 2021-05-11 ENCOUNTER — Other Ambulatory Visit: Payer: Self-pay

## 2021-05-11 ENCOUNTER — Other Ambulatory Visit (INDEPENDENT_AMBULATORY_CARE_PROVIDER_SITE_OTHER): Payer: Medicare Other

## 2021-05-11 DIAGNOSIS — E039 Hypothyroidism, unspecified: Secondary | ICD-10-CM | POA: Diagnosis not present

## 2021-05-11 LAB — TSH: TSH: 0.3 u[IU]/mL — ABNORMAL LOW (ref 0.35–5.50)

## 2021-05-16 ENCOUNTER — Encounter: Payer: Self-pay | Admitting: Obstetrics and Gynecology

## 2021-05-16 ENCOUNTER — Ambulatory Visit (INDEPENDENT_AMBULATORY_CARE_PROVIDER_SITE_OTHER): Payer: Medicare Other | Admitting: Obstetrics and Gynecology

## 2021-05-16 ENCOUNTER — Other Ambulatory Visit: Payer: Self-pay

## 2021-05-16 VITALS — BP 130/82 | HR 95

## 2021-05-16 DIAGNOSIS — N816 Rectocele: Secondary | ICD-10-CM

## 2021-05-16 NOTE — Progress Notes (Signed)
[facility]   Subjective:     Chief Complaint:  Chief Complaint  Patient presents with   [place] is a 71 y.o. female says he wants her pessary out.    History of Present Illness: Shannon [name] is a 71 y.o. female with stage II pelvic organ prolapse who presents for a pessary check. She is using a size  2-1/4in ss gellhorn pessary. The pessary has stayed in place but she has noticed her prolapse bulging around it.   She has been working on her constipation and is taking a fiber supplement 3 times per day which helps the consistency so she is not straining.  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), Anxiety, Asthma, Basal cell carcinoma of face, Benign neoplasm of skin of lower limb, Bilateral carotid bruits (04/12/2019), Diverticulosis, Gestational diabetes, Hammer toe, acquired (03/15/2009), Heart murmur (03/02/2018), History of COVID-19 (09/14/2020), Hyperglycemia (02/24/2017), Hyperlipidemia, mixed (03/11/2016), Hypothyroidism, Knee pain (09/26/2010), Lead exposure (03/02/2018), Lumbar degenerative disc disease (02/24/2017), Major depressive disorder (03/08/2013), Melanocytic nevi of trunk, Menopause, Neck pain on left side (09/10/2018), Neurofibroma (07/20/2019), Nuclear sclerotic cataract of both eyes (07/23/2012), OAB (overactive bladder) (08/05/2016), Osteopenia, Rosacea, Sciatica of left side (02/24/2017), 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   Past Surgical History: She  has a past surgical history that includes Tonsillectomy and adenoidectomy; Dilation and curettage of uterus; blephoroplasty; Brow lift; and Skin cancer excision.   Medications: She has a current medication list which includes the following prescription(s): proair respiclick,  dextroamphetamine, escitalopram, gamma-aminobutyric acid, hydrocortisone, krill oil, levothyroxine sodium, lorazepam, magnesium, and nystatin ointment.   Allergies: Patient is allergic to hydrocodone-acetaminophen, gluten meal, and oxycodone.   Social History: Patient  reports that she quit smoking about 50 years ago. Her smoking use included cigarettes. She has never used smokeless tobacco. She reports current alcohol use of about 3.0 - 4.0 standard drinks per week. She reports that she does not use drugs.      Objective:    Physical Exam: BP 130/82    Pulse 95    LMP 03/29/2004  Gen: No apparent distress, A&O x 3. Detailed Urogynecologic Evaluation:  Pelvic Exam: Normal external female genitalia; Bartholin's and Skene's glands normal in appearance; urethral meatus normal in appearance, no urethral masses or discharge. The pessary was noted to be in place. It was removed and cleaned. Speculum exam revealed no lesions in the vagina.   A new pessary fit. We tried: shaatz, donut and cube pessaries. The shaatz and donut did not reduce the rectocele. A #1 cube pessary was placed which partially reduced the rectocele. It was comfortable, fit well, and stayed in placed with strong cough, valsalva and bending. The patient demonstrated proper placement and removal. Lot # [id number] Exp 03/15/24  POP-Q (02/14/21):    POP-Q   -2.5                                            Aa   -2.5                                           Ba   -  7.5                                              C    4                                            Gh   3                                            Pb   9.5                                            tvl    2                                            Ap   2                                            Bp   -8.5                                              D         Assessment/Plan:    Assessment: Shannon [name] is a 71 y.o. with stage III pelvic organ prolapse    Plan: - A #1 cube pessary was placed today. If this pessary does not work well for her, she will consider surgery.  - She will remove at least weekly, instructed to clean with soap and water .  - continue to use fiber supplement so she is not straining.  All questions were answered.  Jaquita Folds, MD

## 2021-06-12 ENCOUNTER — Telehealth: Payer: Self-pay

## 2021-06-12 NOTE — Telephone Encounter (Signed)
Patient would like to know if she needs to repeat labs or does she just need to wait until her follow up appointment.

## 2021-06-12 NOTE — Telephone Encounter (Signed)
Mychart message sent to patient.

## 2021-06-14 NOTE — Progress Notes (Signed)
[facility]   Subjective:     Chief Complaint:  Chief Complaint  Patient presents with   Pessary Check   History of Present Illness: Shannon [name] is a 71 y.o. female with stage III pelvic organ prolapse who presents for a pessary check. She is using a size #1 cube pessary. The pessary has been working well but was hard to take in and out. She is interested in trying the first pessary she tried but maybe a larger size so it does not fall out.  She denies vaginal bleeding.  Still using fiber supplement (Psyllium) for bowel movements. Has not been taking miralax regularly.   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), Anxiety, Asthma, Basal cell carcinoma of face, Benign neoplasm of skin of lower limb, Bilateral carotid bruits (04/12/2019), Diverticulosis, Gestational diabetes, Hammer toe, acquired (03/15/2009), Heart murmur (03/02/2018), History of COVID-19 (09/14/2020), Hyperglycemia (02/24/2017), Hyperlipidemia, mixed (03/11/2016), Hypothyroidism, Knee pain (09/26/2010), Lead exposure (03/02/2018), Lumbar degenerative disc disease (02/24/2017), Major depressive disorder (03/08/2013), Melanocytic nevi of trunk, Menopause, Neck pain on left side (09/10/2018), Neurofibroma (07/20/2019), Nuclear sclerotic cataract of both eyes (07/23/2012), OAB (overactive bladder) (08/05/2016), Osteopenia, Rosacea, Sciatica of left side (02/24/2017), 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   Past Surgical History: She  has a past surgical history that includes Tonsillectomy and adenoidectomy; Dilation and curettage of uterus; blephoroplasty; Brow lift; and Skin cancer excision.   Medications: She has a current medication list which includes the following  prescription(s): proair respiclick, dextroamphetamine, escitalopram, gamma-aminobutyric acid, hydrocortisone, krill oil, levothyroxine sodium, lorazepam, magnesium, and nystatin ointment.   Allergies: Patient is allergic to hydrocodone-acetaminophen, gluten meal, and oxycodone.   Social History: Patient  reports that she quit smoking about 50 years ago. Her smoking use included cigarettes. She has never used smokeless tobacco. She reports current alcohol use of about 3.0 - 4.0 standard drinks per week. She reports that she does not use drugs.      Objective:    Physical Exam: BP 132/79    Pulse 65    LMP 03/29/2004  Gen: No apparent distress, A&O x 3. Detailed Urogynecologic Evaluation:  Pelvic Exam: Normal external female genitalia; Bartholin's and Skene's glands normal in appearance; urethral meatus normal in appearance, no urethral masses or discharge. The pessary was noted to be in place. It was removed and cleaned. Speculum exam revealed no lesions in the vagina.  She was fitted with a #5 incontinence dish with support pessary. This fit well and was comfortable with walking and valsalva. We did not have this pessary in stock so the #1 cube pessary was replaced. It was comfortable to the patient and fit well.   POP-Q (02/14/21):    POP-Q   -2.5                                            Aa   -2.5                                           Ba   -7.5  C    4                                            Gh   3                                            Pb   9.5                                            tvl    2                                            Ap   2                                            Bp   -8.5                                              D         Assessment/Plan:    Assessment: [name] is a 71 y.o. with stage II pelvic organ prolapse and stress incontinence here for a pessary check. She is doing  well.  Plan: - continue with the #1 cube pessary - #5 incontinence dish not in stock, so will order and let patient know when it arrives.  - She will try out both pessaries to decide which she prefers.  - Also reviewed the option of surgery (posterior repair), which she will consider  Time spent: I spent 20 minutes dedicated to the care of this patient on the date of this encounter to include pre-visit review of records, face-to-face time with the patient and post visit documentation.

## 2021-06-15 ENCOUNTER — Encounter: Payer: Self-pay | Admitting: Obstetrics and Gynecology

## 2021-06-15 ENCOUNTER — Ambulatory Visit (INDEPENDENT_AMBULATORY_CARE_PROVIDER_SITE_OTHER): Payer: Medicare Other | Admitting: Obstetrics and Gynecology

## 2021-06-15 ENCOUNTER — Other Ambulatory Visit: Payer: Self-pay

## 2021-06-15 VITALS — BP 132/79 | HR 65

## 2021-06-15 DIAGNOSIS — N816 Rectocele: Secondary | ICD-10-CM

## 2021-06-18 ENCOUNTER — Other Ambulatory Visit: Payer: Self-pay | Admitting: Family Medicine

## 2021-06-19 DIAGNOSIS — Z1231 Encounter for screening mammogram for malignant neoplasm of breast: Secondary | ICD-10-CM | POA: Diagnosis not present

## 2021-06-19 LAB — HM MAMMOGRAPHY

## 2021-06-19 MED ORDER — DEXTROAMPHETAMINE SULFATE 10 MG PO TABS: 10.0000 mg | ORAL_TABLET | Freq: Every day | ORAL | 0 refills | Status: DC

## 2021-06-19 NOTE — Telephone Encounter (Signed)
Requesting:Dextrostat Contract:NA UDS:NA Last Visit:04-16-21 Next Visit:07-19-21 Last Refill:03-02-21  Please Advise

## 2021-06-20 ENCOUNTER — Ambulatory Visit: Payer: Medicare Other | Admitting: Internal Medicine

## 2021-06-21 ENCOUNTER — Encounter: Payer: Self-pay | Admitting: *Deleted

## 2021-06-21 ENCOUNTER — Encounter: Payer: Self-pay | Admitting: Family Medicine

## 2021-06-22 ENCOUNTER — Encounter: Payer: Self-pay | Admitting: Internal Medicine

## 2021-06-22 ENCOUNTER — Other Ambulatory Visit: Payer: Self-pay

## 2021-06-22 ENCOUNTER — Ambulatory Visit (INDEPENDENT_AMBULATORY_CARE_PROVIDER_SITE_OTHER): Payer: Medicare Other | Admitting: Internal Medicine

## 2021-06-22 VITALS — BP 110/68 | HR 65 | Ht 65.0 in | Wt 169.6 lb

## 2021-06-22 DIAGNOSIS — E559 Vitamin D deficiency, unspecified: Secondary | ICD-10-CM

## 2021-06-22 DIAGNOSIS — E039 Hypothyroidism, unspecified: Secondary | ICD-10-CM

## 2021-06-22 LAB — TSH: TSH: 0.26 u[IU]/mL — ABNORMAL LOW (ref 0.35–5.50)

## 2021-06-22 LAB — T4, FREE: Free T4: 0.99 ng/dL (ref 0.60–1.60)

## 2021-06-22 LAB — VITAMIN D 25 HYDROXY (VIT D DEFICIENCY, FRACTURES): VITD: 75.93 ng/mL (ref 30.00–100.00)

## 2021-06-22 MED ORDER — TIROSINT 88 MCG PO CAPS: 88.0000 ug | ORAL_CAPSULE | Freq: Every day | ORAL | 1 refills | Status: DC

## 2021-06-22 NOTE — Patient Instructions (Addendum)
°  Continue Tirosint 100 mcg  Monday through Saturday for now      You are on levothyroxine - which is your thyroid hormone supplement. You MUST take this consistently.  You should take this first thing in the morning on an empty stomach with water. You should not take it with other medications. Wait 37min to 1hr prior to eating. If you are taking any vitamins - please take these in the evening.   If you miss a dose, please take your missed dose the following day (double the dose for that day). You should have a pill box for ONLY levothyroxine on your bedside table to help you remember to take your medications.

## 2021-06-22 NOTE — Progress Notes (Signed)
Name: Shannon [name]  MRN/ DOB: [id number], 07-30-1950    Age/ Sex: 71 y.o., female    PCP: Mosie Lukes, MD   Reason for Endocrinology Evaluation: Hypothyroid     Date of Initial Endocrinology Evaluation: 02/12/2021    HPI: [name] is a 71 y.o. female with a past medical history of Hypothyroidism, gluten intolerance,Asthma , ADHD. The patient presented for initial endocrinology clinic visit on 06/22/2021 for consultative assistance with her hypothyroid.   She has been diagnosed with hypothyroidism in 2005.   She was following with an [employer] manager in [place] . She is on Tirosint 100 mcg daily at night AND Desiccated thyroid 7.5 mg daily, has been on this regimen for ~ 10 yrs   She has gluten intolerance but no celiac disease diagnosis  Historically her thyroid replacement therapy has been adjusted based on her symptoms, she had noted during the summer that she has depression and leg cramps as well as occasional sleeping issues and would decrease thyroid hormone replacement therapy during the summer season to half a tablet once a week and 1 tablet the rest of the week.  In the fall she tends to go up to taking 1 tablet daily and has noted improvement in her symptoms.  She is on Vitamin D replacement . Takes 4000 iu daily in the winter and 2000 iu daily in the summer   On her initial visit we stopped compound thyroid hormone 7.5 mcg and continued Tirosint 100 mcg with a TSH 0.09 uIU/mL     No Fh of thyroid disease   SUBJECTIVE:    Today (06/22/21):  [name] is here for a follow up on hypothyroidism  Weight stable  Denies loose stool or diarrhea , she is constipated , has a rectocele  Denies palpitations Stable cold intolerance  Denies local neck swelling  Denies tremors  Energy level stable   Since has been sleeping great , and no anxiety  More brain fog and constipation and leg cramps  Per pt  She is on Magnesium for cramps     She is on Vitamin  D 3000 iu daily Tirosint 100 mcg Monday through Saturday     HISTORY:  Past Medical History:  Past Medical History:  Diagnosis Date   ADHD (attention deficit hyperactivity disorder), 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 Left torn meniscus Right arthritis   Lead exposure 03/02/2018   Lumbar degenerative disc disease 02/24/2017   MRI 2019 confirms multilevel change  Sciatica at times 2/2 this and periodic radiculopathy   Major depressive disorder 03/08/2013   Melanocytic nevi of trunk    Menopause    Neck pain on left side 09/10/2018   Suspect this is DDD and DJD - XR is pending Neurapraxic spasm to left trapezius   Neurofibroma 07/20/2019   Nuclear sclerotic cataract of both eyes 07/23/2012   OAB (overactive bladder) 08/05/2016   Osteopenia    Rosacea    Sciatica of left side 02/24/2017  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   By exam she has chronic tendinopathy of Glut Med   Urinary urgency 09/17/2020   Vertigo 04/12/2019   Vitamin D deficiency 02/28/2014   Past Surgical History:  Past Surgical History:  Procedure Laterality Date   blephoroplasty     BROW LIFT     DILATION AND CURETTAGE OF UTERUS     SKIN CANCER EXCISION     right lower lip   TONSILLECTOMY AND ADENOIDECTOMY      Social History:  reports that she quit smoking about 50 years ago. Her smoking use included cigarettes. She has never used smokeless tobacco. She reports current alcohol use of about 3.0 - 4.0 standard drinks per week. She reports that she does not use drugs. Family  History: family history includes ADD / ADHD in her daughter and son; Alcohol abuse in her sister; Anxiety disorder in her brother, daughter, and son; Cancer in her sister and sister; Colon cancer in her maternal grandfather; Colon polyps in her mother; Depression in her daughter and son; Diabetes in her father; Emphysema in her father; Parkinson's disease in her maternal grandmother and mother; Tuberculosis in her father.   HOME MEDICATIONS: Allergies as of 06/22/2021       Reactions   Hydrocodone-acetaminophen Nausea And Vomiting   Gluten Meal Other (See Comments)   GI symptoms; has BM everytime she urinates GI symptoms; has BM everytime she urinates   Oxycodone Nausea And Vomiting        Medication List        Accurate as of June 22, 2021  7:32 AM. If you have any questions, ask your nurse or doctor.          dextroamphetamine 10 MG tablet Commonly known as: DEXTROSTAT Take 1 tablet (10 mg total) by mouth daily.   escitalopram 10 MG tablet Commonly known as: Lexapro Take 1 tablet (10 mg total) by mouth daily.   GAMMA AMINOBUTYRIC ACID PO Take 200 mg by mouth as needed. Up to bid   hydrocortisone 2.5 % rectal cream Commonly known as: Proctozone-HC Place 1 application rectally 2 (two) times daily.   Krill Oil 1000 MG Caps   Levothyroxine Sodium 100 MCG Caps Commonly known as: Tirosint Take 1 capsule (100 mcg total) by mouth daily before breakfast. What changed: additional instructions   LORazepam 2 MG tablet Commonly known as: ATIVAN Take 1 tablet (2 mg total) by mouth every 8 (eight) hours as needed for anxiety.   MAGNESIUM GLYCINATE PLUS PO Take 100 mg by mouth 2 (two) times daily.   nystatin ointment Commonly known as: MYCOSTATIN   ProAir RespiClick 229 (90 Base) MCG/ACT Aepb Generic drug: Albuterol Sulfate Inhale 2 puffs every 4-6 hours as needed for cough and shortness of breath.          REVIEW OF SYSTEMS: A comprehensive ROS was conducted  with the patient and is negative except as per HPI     OBJECTIVE:  VS: LMP 03/29/2004    Wt Readings from Last 3 Encounters:  04/16/21 170 lb 12.8 oz (77.5 kg)  04/09/21 168 lb (76.2 kg)  03/08/21 168 lb (76.2 kg)     EXAM: General: Pt appears well and is in NAD  Neck: General: Supple without adenopathy. Thyroid: Thyroid size normal.  No goiter or nodules appreciated. No thyroid bruit.  Lungs: Clear with good BS bilat with no rales, rhonchi, or wheezes  Heart: Auscultation: RRR.  Abdomen: Normoactive bowel sounds, soft, nontender, without masses  or organomegaly palpable  Extremities:  BL LE: No pretibial edema normal ROM and strength.  Mental Status: Judgment, insight: Intact Orientation: Oriented to time, place, and person Mood and affect: No depression, anxiety, or agitation     DATA REVIEWED:   Latest Reference Range & Units 06/22/21 08:30  TSH 0.35 - 5.50 uIU/mL 0.26 (L)  T4,Free(Direct) 0.60 - 1.60 ng/dL 0.99    Latest Reference Range & Units 06/22/21 08:30  VITD 30.00 - 100.00 ng/mL 75.93    ASSESSMENT/PLAN/RECOMMENDATIONS:   Hypothyroidism:   - Pt with non-specific symptoms  - No local neck symptoms -TSH remains low at 0.26 you IU/mL, free T4 is trending down -I am going to adjust Tirosint from 100 mcg 6 days a week to 88 mcg daily, I am wondering if her TSH needs a little bit more time to adjust for a given T4 reading -Patient will have TFTs in 2 months, I am also going to check on the [facility] to make sure pituitary gland is working     Medications : Continue Tirosint 100 mcg daily    2. Hx vitamin D deficiency:  -Vitamin D continues to be within the normal range -No changes   Medication Continue Vitamin D3 3000 IU daily   3.  Constipation:   -Patient to discuss this with PCP -I have explained to the patient that I do not think this has anything to do with her thyroid   Follow-up in 6 months  I spent 25 minutes preparing to see the  patient by review of recent labs, imaging and procedures, obtaining and reviewing separately obtained history, communicating with the patient, ordering medications, tests or procedures, and documenting clinical information in the EHR including the differential Dx, treatment, and any further evaluation and other management    Signed electronically by: Mack Guise, MD  [facility]  [facility] [address] [place], [place] [address] Phone: [phone] FAX: [phone]   CC: Mosie Lukes, MD [address] [place] [address] Phone: [phone] Fax: [phone]   Return to Endocrinology clinic as below: Future Appointments  Date Time Provider [place]  06/22/2021  7:50 AM Lauralee Waters, Melanie Crazier, MD [facility] None  07/19/2021  3:00 PM [facility]  08/16/2021 10:20 AM Mosie Lukes, MD [facility]  02/13/2022 10:20 AM Jaquita Folds, MD [facility]

## 2021-06-25 DIAGNOSIS — M542 Cervicalgia: Secondary | ICD-10-CM | POA: Diagnosis not present

## 2021-06-27 ENCOUNTER — Other Ambulatory Visit: Payer: Medicare Other

## 2021-07-04 ENCOUNTER — Ambulatory Visit
Admission: RE | Admit: 2021-07-04 | Discharge: 2021-07-04 | Disposition: A | Discharge status: Home / Self Care | Payer: Medicare Other | Source: Ambulatory Visit | Attending: Internal Medicine | Admitting: Internal Medicine

## 2021-07-04 DIAGNOSIS — E039 Hypothyroidism, unspecified: Secondary | ICD-10-CM | POA: Diagnosis not present

## 2021-07-05 ENCOUNTER — Encounter: Payer: Self-pay | Admitting: Obstetrics and Gynecology

## 2021-07-06 NOTE — Telephone Encounter (Signed)
No it has not come in yet. Sometimes the can be on back order and take a few weeks. We will call you when they come in. ?

## 2021-07-11 DIAGNOSIS — H02836 Dermatochalasis of left eye, unspecified eyelid: Secondary | ICD-10-CM | POA: Diagnosis not present

## 2021-07-11 DIAGNOSIS — H43813 Vitreous degeneration, bilateral: Secondary | ICD-10-CM | POA: Diagnosis not present

## 2021-07-11 DIAGNOSIS — H25813 Combined forms of age-related cataract, bilateral: Secondary | ICD-10-CM | POA: Diagnosis not present

## 2021-07-11 DIAGNOSIS — H52203 Unspecified astigmatism, bilateral: Secondary | ICD-10-CM | POA: Diagnosis not present

## 2021-07-11 DIAGNOSIS — H5213 Myopia, bilateral: Secondary | ICD-10-CM | POA: Diagnosis not present

## 2021-07-11 DIAGNOSIS — H02833 Dermatochalasis of right eye, unspecified eyelid: Secondary | ICD-10-CM | POA: Diagnosis not present

## 2021-07-11 DIAGNOSIS — H524 Presbyopia: Secondary | ICD-10-CM | POA: Diagnosis not present

## 2021-07-18 DIAGNOSIS — M542 Cervicalgia: Secondary | ICD-10-CM | POA: Diagnosis not present

## 2021-07-19 ENCOUNTER — Ambulatory Visit: Payer: Medicare Other

## 2021-07-19 ENCOUNTER — Other Ambulatory Visit (INDEPENDENT_AMBULATORY_CARE_PROVIDER_SITE_OTHER): Payer: Medicare Other

## 2021-07-19 ENCOUNTER — Ambulatory Visit (INDEPENDENT_AMBULATORY_CARE_PROVIDER_SITE_OTHER): Payer: Medicare Other

## 2021-07-19 VITALS — BP 108/66 | HR 87 | Temp 97.8°F | Resp 16 | Ht 65.0 in | Wt 167.8 lb

## 2021-07-19 DIAGNOSIS — Z0000 Encounter for general adult medical examination without abnormal findings: Secondary | ICD-10-CM

## 2021-07-19 DIAGNOSIS — E039 Hypothyroidism, unspecified: Secondary | ICD-10-CM

## 2021-07-19 DIAGNOSIS — Z780 Asymptomatic menopausal state: Secondary | ICD-10-CM

## 2021-07-19 NOTE — Progress Notes (Signed)
Pt came to lab. Only future orders were by Dr Kelton Pillar for fsh, tsh and free t4 dated for 07/20/21.  Pt was going to proceed but did not feel sure that labs were due at this time and states she will contact Dr [facility] office before having them done. Tests reordered as future. ?

## 2021-07-19 NOTE — Progress Notes (Addendum)
? ?Subjective:  ? Shannon [name] is a 71 y.o. female who presents for Medicare Annual (Subsequent) preventive examination. ? ?Review of Systems    ? ?Cardiac Risk Factors include: none;dyslipidemia ? ?   ?Objective:  ?  ?Today's Vitals  ? 07/19/21 1523  ?BP: 108/66  ?Pulse: 87  ?Resp: 16  ?Temp: 97.8 ?F (36.6 ?C)  ?SpO2: 95%  ?Weight: 167 lb 12.8 oz (76.1 kg)  ?Height: '5\' 5"'$  (1.651 m)  ? ?Body mass index is 27.92 kg/m?. ? ? ?  07/19/2021  ?  3:18 PM 07/13/2020  ?  3:32 PM 03/30/2019  ?  3:16 PM 03/12/2018  ?  2:38 PM 02/24/2017  ? 10:39 AM 02/22/2016  ?  1:04 PM 02/22/2016  ?  8:36 AM  ?Advanced Directives  ?Does Patient Have a Medical Advance Directive? Yes Yes Yes Yes Yes Yes Yes  ?Type of Paramedic of [place];Living will;Out of facility DNR (pink MOST or yellow form) [place];Living will [place];Living will [place];Living will [place];Living will [place];Living will [place];Living will  ?Does patient want to make changes to medical advance directive? No - Patient declined  No - Patient declined  No - Patient declined    ?Copy of [place] in Chart? Yes - validated most recent copy scanned in chart (See row information) Yes - validated most recent copy scanned in chart (See row information) Yes - validated most recent copy scanned in chart (See row information) Yes - validated most recent copy scanned in chart (See row information) No - copy requested No - copy requested   ? ? ?Current Medications (verified) ?Outpatient Encounter Medications as of 07/19/2021  ?Medication Sig  ? Albuterol Sulfate (PROAIR RESPICLICK) 952 (90 Base) MCG/ACT AEPB Inhale 2 puffs every 4-6 hours as needed for cough and shortness of breath.  ? dextroamphetamine (DEXTROSTAT) 10 MG tablet Take 1 tablet (10 mg total) by mouth daily.  ? escitalopram (LEXAPRO) 10 MG tablet Take 1  tablet (10 mg total) by mouth daily.  ? GAMMA AMINOBUTYRIC ACID PO Take 200 mg by mouth as needed. Up to bid  ? hydrocortisone (PROCTOZONE-HC) 2.5 % rectal cream Place 1 application rectally 2 (two) times daily.  ? Krill Oil 1000 MG CAPS   ? LORazepam (ATIVAN) 2 MG tablet Take 1 tablet (2 mg total) by mouth every 8 (eight) hours as needed for anxiety.  ? MAGNESIUM GLYCINATE PLUS PO Take 100 mg by mouth 2 (two) times daily.  ? nystatin ointment (MYCOSTATIN)   ? TIROSINT 88 MCG CAPS Take 1 capsule (88 mcg total) by mouth daily before breakfast.  ? ?No facility-administered encounter medications on file as of 07/19/2021.  ? ? ?Allergies (verified) ?Hydrocodone-acetaminophen, Gluten meal, and Oxycodone  ? ?History: ?Past Medical History:  ?Diagnosis Date  ? 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 Anxiety   ? Asthma   ? Basal cell carcinoma of face   ? Benign neoplasm of skin of lower limb   ? Bilateral carotid bruits 04/12/2019  ? Diverticulosis   ? Gestational diabetes   ? Hammer toe, acquired 03/15/2009  ? Heart murmur 03/02/2018  ? History of COVID-19 09/14/2020  ? Hyperglycemia 02/24/2017  ? Hyperlipidemia, mixed 03/11/2016  ? Hypothyroidism   ? Knee pain 09/26/2010  ? Crepitation on the right and minimal on the left. Left torn meniscus Right arthritis  ? Lead  exposure 03/02/2018  ? Lumbar degenerative disc disease 02/24/2017  ? MRI 2019 confirms multilevel change  Sciatica at times 2/2 this and periodic radiculopathy  ? Major depressive disorder 03/08/2013  ? Melanocytic nevi of trunk   ? Menopause   ? Neck pain on left side 09/10/2018  ? Suspect this is DDD and DJD - XR is pending Neurapraxic spasm to left trapezius  ? Neurofibroma 07/20/2019  ? Nuclear sclerotic cataract of both eyes 07/23/2012  ? OAB (overactive bladder) 08/05/2016  ? Osteopenia   ? Rosacea   ? Sciatica of left side 02/24/2017  ? 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  ? By exam she has chronic  tendinopathy of Glut Med  ? Urinary urgency 09/17/2020  ? Vertigo 04/12/2019  ? Vitamin D deficiency 02/28/2014  ? ?Past Surgical History:  ?Procedure Laterality Date  ? blephoroplasty    ? BROW LIFT    ? DILATION AND CURETTAGE OF UTERUS    ? SKIN CANCER EXCISION    ? right lower lip  ? TONSILLECTOMY AND ADENOIDECTOMY    ? ?Family History  ?Problem Relation Age of Onset  ? Colon polyps Mother   ? Parkinson's disease Mother   ?     developed Parkinson's disease dementia  ? Diabetes Father   ? Emphysema Father   ? Tuberculosis Father   ? Alcohol abuse Sister   ? Cancer Sister   ?     skin cancer, melanoma cell, basil cell  ? Cancer Sister   ?     melanoma  ? Anxiety disorder Brother   ? Parkinson's disease Maternal Grandmother   ?     developed Parkinson's disease dementia  ? Colon cancer Maternal Grandfather   ? ADD / ADHD Daughter   ? Anxiety disorder Daughter   ? Depression Daughter   ? ADD / ADHD Son   ? Anxiety disorder Son   ? Depression Son   ? ?Social History  ? ?Socioeconomic History  ? Marital status: Single  ?  Spouse name: Not on file  ? Number of children: 2  ? Years of education: 70  ? Highest education level: Master's degree (e.g., MA, MS, MEng, MEd, MSW, MBA)  ?Occupational History  ? 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 Smoking status: Former  ?  Types: Cigarettes  ?  Quit date: 08/20/1970  ?  Years since quitting: 50.9  ? Smokeless tobacco: Never  ?Vaping Use  ? Vaping Use: Never used  ?Substance and Sexual Activity  ? Alcohol use: Yes  ?  Alcohol/week: 3.0 - 4.0 standard drinks  ?  Types: 1 - 2 Glasses of wine, 1 Cans of beer, 1 Shots of liquor per week  ?  Comment: not every night.  ? Drug use: No  ? Sexual activity: Not Currently  ?Other Topics Concern  ? Not on file  ?Social History Narrative  ? Retired from [employer].  ? No major dietary restrictions except avoid gluten.   ?   ? ?Social Determinants of Health  ? ?Financial Resource Strain: Not on file   ?Food Insecurity: Not on file  ?Transportation Needs: Not on file  ?Physical Activity: Not on file  ?Stress: Not on file  ?Social Connections: Not on file  ? ? ?Tobacco Counseling ?Counseling given: Not Answered ? ? ?Clinical Intake: ? ?Pre-visit preparation completed: Yes ? ?Pain : No/denies pain ? ?  ? ?BMI - recorded: 27.92 ?Nutritional Status: BMI  25 -29 Overweight ?Nutritional Risks: None ?Diabetes: No ? ?How often do you need to have someone help you when you read instructions, pamphlets, or other written materials from your doctor or pharmacy?: 1 - Never ? ?Diabetic?No ? ?  ? ?Information entered by :: Stayce Delancy ? ? ?Activities of Daily Living ? ?  07/19/2021  ?  3:29 PM  ?In your present state of health, do you have any difficulty performing the following activities:  ?Hearing? 0  ?Vision? 0  ?Difficulty concentrating or making decisions? 0  ?Walking or climbing stairs? 0  ?Dressing or bathing? 0  ?Doing errands, shopping? 0  ?Preparing Food and eating ? N  ?Using the Toilet? N  ?In the past six months, have you accidently leaked urine? Y  ?Do you have problems with loss of bowel control? N  ?Managing your Medications? N  ?Managing your Finances? N  ?Housekeeping or managing your Housekeeping? N  ? ? ?Patient Care Team: ?Mosie Lukes, MD as PCP - General (Family Medicine) ? ?Indicate any recent Medical Services you Piccola have received from other than [facility] providers in the past year (date Mersedes be approximate). ? ?   ?Assessment:  ? This is a routine wellness examination for Shannon [name]. ? ?Hearing/Vision screen ?No results found. ? ?Dietary issues and exercise activities discussed: ?Current Exercise Habits: Structured exercise class, Type of exercise: walking;stretching, Time (Minutes): 60, Frequency (Times/Week): 7, Weekly Exercise (Minutes/Week): 420, Intensity: Mild, Exercise limited by: None identified ? ? Goals Addressed   ? ?  ?  ?  ?  ? This Visit's Progress  ?  Patient Stated   Not on track  ?  Control  sugar intake ? ?  ?  Patient Stated   On track  ?  Eat healthier & increase activity ?  ? ?  ? ?Depression Screen ? ?  07/19/2021  ?  3:28 PM 09/14/2020  ? 11:25 AM 07/13/2020  ?  3:36 PM 04/11/2020  ?  9:02 AM

## 2021-07-19 NOTE — Patient Instructions (Signed)
Ms. Robben , ?Thank you for taking time to come for your Medicare Wellness Visit. I appreciate your ongoing commitment to your health goals. Please review the following plan we discussed and let me know if I can assist you in the future.  ? ?Screening recommendations/referrals: ?Colonoscopy: 03/07/16 due 03/07/26 ?Mammogram: 06/19/21 due 06/19/22 ?Bone Density: ordered 07/19/21 ?Recommended yearly ophthalmology/optometry visit for glaucoma screening and checkup ?Recommended yearly dental visit for hygiene and checkup ? ?Vaccinations: ?Influenza vaccine: up to date ?Pneumococcal vaccine: up to date ?Tdap vaccine: up to date ?Shingles vaccine: up to date   ?Covid-19:Due-Deatra obtain vaccine at your local pharmacy.  ? ?Advanced directives: yes, on file ? ?Conditions/risks identified: see problem list ? ?Next appointment: Follow up in one year for your annual wellness visit  ? ? ?Preventive Care 26 Years and Older, Female ?Preventive care refers to lifestyle choices and visits with your health care provider that can promote health and wellness. ?What does preventive care include? ?A yearly physical exam. This is also called an annual well check. ?Dental exams once or twice a year. ?Routine eye exams. Ask your health care provider how often you should have your eyes checked. ?Personal lifestyle choices, including: ?Daily care of your teeth and gums. ?Regular physical activity. ?Eating a healthy diet. ?Avoiding tobacco and drug use. ?Limiting alcohol use. ?Practicing safe sex. ?Taking low-dose aspirin every day. ?Taking vitamin and mineral supplements as recommended by your health care provider. ?What happens during an annual well check? ?The services and screenings done by your health care provider during your annual well check will depend on your age, overall health, lifestyle risk factors, and family history of disease. ?Counseling  ?Your health care provider Dalina ask you questions about your: ?Alcohol use. ?Tobacco use. ?Drug  use. ?Emotional well-being. ?Home and relationship well-being. ?Sexual activity. ?Eating habits. ?History of falls. ?Memory and ability to understand (cognition). ?Work and work Statistician. ?Reproductive health. ?Screening  ?You T[place]alaya have the following tests or measurements: ?Height, weight, and BMI. ?Blood pressure. ?Lipid and cholesterol levels. These Abriana be checked every 5 years, or more frequently if you are over 46 years old. ?Skin check. ?Lung cancer screening. You Pearly have this screening every year starting at age 33 if you have a 30-pack-year history of smoking and currently smoke or have quit within the past 15 years. ?Fecal occult blood test (FOBT) of the stool. You Mitzie have this test every year starting at age 95. ?Flexible sigmoidoscopy or colonoscopy. You Heena have a sigmoidoscopy every 5 years or a colonoscopy every 10 years starting at age 81. ?Hepatitis C blood test. ?Hepatitis B blood test. ?Sexually transmitted disease (STD) testing. ?Diabetes screening. This is done by checking your blood sugar (glucose) after you have not eaten for a while (fasting). You Davita have this done every 1-3 years. ?Bone density scan. This is done to screen for osteoporosis. You Samiya have this done starting at age 105. ?Mammogram. This Lorayne be done every 1-2 years. Talk to your health care provider about how often you should have regular mammograms. ?Talk with your health care provider about your test results, treatment options, and if necessary, the need for more tests. ?Vaccines  ?Your health care provider Oliviagrace recommend certain vaccines, such as: ?Influenza vaccine. This is recommended every year. ?Tetanus, diphtheria, and acellular pertussis (Tdap, Td) vaccine. You Tinsleigh need a Td booster every 10 years. ?Zoster vaccine. You Shardea need this after age 11. ?Pneumococcal 13-valent conjugate (PCV13) vaccine. One dose is recommended after age  65. ?Pneumococcal polysaccharide (PPSV23) vaccine. One dose is recommended after age  44. ?Talk to your health care provider about which screenings and vaccines you need and how often you need them. ?This information is not intended to replace advice given to you by your health care provider. Make sure you discuss any questions you have with your health care provider. ?Document Released: 05/12/2015 Document Revised: 01/03/2016 Document Reviewed: 02/14/2015 ?Elsevier Interactive Patient Education ? 2017 [place]. ? ?Fall Prevention in the Home ?Falls can cause injuries. They can happen to people of all ages. There are many things you can do to make your home safe and to help prevent falls. ?What can I do on the outside of my home? ?Regularly fix the edges of walkways and driveways and fix any cracks. ?Remove anything that might make you trip as you walk through a door, such as a raised step or threshold. ?Trim any bushes or trees on the path to your home. ?Use bright outdoor lighting. ?Clear any walking paths of anything that might make someone trip, such as rocks or tools. ?Regularly check to see if handrails are loose or broken. Make sure that both sides of any steps have handrails. ?Any raised decks and porches should have guardrails on the edges. ?Have any leaves, snow, or ice cleared regularly. ?Use sand or salt on walking paths during winter. ?Clean up any spills in your garage right away. This includes oil or grease spills. ?What can I do in the bathroom? ?Use night lights. ?Install grab bars by the toilet and in the tub and shower. Do not use towel bars as grab bars. ?Use non-skid mats or decals in the tub or shower. ?If you need to sit down in the shower, use a plastic, non-slip stool. ?Keep the floor dry. Clean up any water that spills on the floor as soon as it happens. ?Remove soap buildup in the tub or shower regularly. ?Attach bath mats securely with double-sided non-slip rug tape. ?Do not have throw rugs and other things on the floor that can make you trip. ?What can I do in the  bedroom? ?Use night lights. ?Make sure that you have a light by your bed that is easy to reach. ?Do not use any sheets or blankets that are too big for your bed. They should not hang down onto the floor. ?Have a firm chair that has side arms. You can use this for support while you get dressed. ?Do not have throw rugs and other things on the floor that can make you trip. ?What can I do in the kitchen? ?Clean up any spills right away. ?Avoid walking on wet floors. ?Keep items that you use a lot in easy-to-reach places. ?If you need to reach something above you, use a strong step stool that has a grab bar. ?Keep electrical cords out of the way. ?Do not use floor polish or wax that makes floors slippery. If you must use wax, use non-skid floor wax. ?Do not have throw rugs and other things on the floor that can make you trip. ?What can I do with my stairs? ?Do not leave any items on the stairs. ?Make sure that there are handrails on both sides of the stairs and use them. Fix handrails that are broken or loose. Make sure that handrails are as long as the stairways. ?Check any carpeting to make sure that it is firmly attached to the stairs. Fix any carpet that is loose or worn. ?Avoid having throw rugs at  the top or bottom of the stairs. If you do have throw rugs, attach them to the floor with carpet tape. ?Make sure that you have a light switch at the top of the stairs and the bottom of the stairs. If you do not have them, ask someone to add them for you. ?What else can I do to help prevent falls? ?Wear shoes that: ?Do not have high heels. ?Have rubber bottoms. ?Are comfortable and fit you well. ?Are closed at the toe. Do not wear sandals. ?If you use a stepladder: ?Make sure that it is fully opened. Do not climb a closed stepladder. ?Make sure that both sides of the stepladder are locked into place. ?Ask someone to hold it for you, if possible. ?Clearly mark and make sure that you can see: ?Any grab bars or  handrails. ?First and last steps. ?Where the edge of each step is. ?Use tools that help you move around (mobility aids) if they are needed. These include: ?Canes. ?Walkers. ?Scooters. ?Crutches. ?Turn on the lights when yo

## 2021-07-19 NOTE — Addendum Note (Signed)
Addended by: Kelle Darting A on: 07/19/2021 04:44 PM ? ? Modules accepted: Orders ? ?

## 2021-07-20 ENCOUNTER — Encounter: Payer: Self-pay | Admitting: Family Medicine

## 2021-07-20 DIAGNOSIS — Z23 Encounter for immunization: Secondary | ICD-10-CM | POA: Diagnosis not present

## 2021-07-25 ENCOUNTER — Telehealth: Payer: Self-pay | Admitting: Family Medicine

## 2021-07-25 NOTE — Telephone Encounter (Signed)
Order changed and faxed to [facility]. Ebony Hail notified.  ?

## 2021-07-25 NOTE — Addendum Note (Signed)
Addended by: Lynnea Ferrier R on: 07/25/2021 11:50 AM ? ? Modules accepted: Orders ? ?

## 2021-07-25 NOTE — Telephone Encounter (Signed)
Shannon [name] from [facility] stated a bone density referral needs to be sent, pt scheduled tomorrow at noon for appointment. Fax: [phone]. Shannon [name] direct telephone: [phone] ?

## 2021-07-26 DIAGNOSIS — M85852 Other specified disorders of bone density and structure, left thigh: Secondary | ICD-10-CM | POA: Diagnosis not present

## 2021-07-26 DIAGNOSIS — Z780 Asymptomatic menopausal state: Secondary | ICD-10-CM | POA: Diagnosis not present

## 2021-07-26 DIAGNOSIS — M85851 Other specified disorders of bone density and structure, right thigh: Secondary | ICD-10-CM | POA: Diagnosis not present

## 2021-07-26 LAB — HM DEXA SCAN

## 2021-07-28 ENCOUNTER — Encounter: Payer: Self-pay | Admitting: Family Medicine

## 2021-08-03 ENCOUNTER — Other Ambulatory Visit: Payer: Self-pay | Admitting: Family Medicine

## 2021-08-06 ENCOUNTER — Encounter: Payer: Self-pay | Admitting: Family Medicine

## 2021-08-06 MED ORDER — DEXTROAMPHETAMINE SULFATE 10 MG PO TABS: 10.0000 mg | ORAL_TABLET | Freq: Every day | ORAL | 0 refills | Status: DC

## 2021-08-06 NOTE — Telephone Encounter (Signed)
Requesting: dextrostat '10mg'$  ?Contract: will get at next visit ?UDS: will get at next visit ?Last Visit: 04/16/21 ?Next Visit: 08/23/21 ?Last Refill: 06/19/21 ? ?Please Advise ? ?

## 2021-08-08 DIAGNOSIS — M542 Cervicalgia: Secondary | ICD-10-CM | POA: Diagnosis not present

## 2021-08-15 ENCOUNTER — Other Ambulatory Visit (INDEPENDENT_AMBULATORY_CARE_PROVIDER_SITE_OTHER): Payer: Medicare Other

## 2021-08-15 DIAGNOSIS — E039 Hypothyroidism, unspecified: Secondary | ICD-10-CM

## 2021-08-15 LAB — FOLLICLE STIMULATING HORMONE: FSH: 66.9 m[IU]/mL

## 2021-08-15 LAB — TSH: TSH: 0.46 u[IU]/mL (ref 0.35–5.50)

## 2021-08-15 LAB — T4, FREE: Free T4: 1.18 ng/dL (ref 0.60–1.60)

## 2021-08-16 ENCOUNTER — Ambulatory Visit: Payer: Medicare Other | Admitting: Family Medicine

## 2021-08-21 DIAGNOSIS — M542 Cervicalgia: Secondary | ICD-10-CM | POA: Diagnosis not present

## 2021-08-22 NOTE — Progress Notes (Deleted)
? ?Subjective:  ? ? Patient ID: Shannon [name], female    DOB: 25-Jul-1950, 71 y.o.   MRN: [id number] ? ?No chief complaint on file. ? ? ?HPI ?Patient is in today for a follow up. ? ?Past Medical History:  ?Diagnosis Date  ? 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 Anxiety   ? Asthma   ? Basal cell carcinoma of face   ? Benign neoplasm of skin of lower limb   ? Bilateral carotid bruits 04/12/2019  ? Diverticulosis   ? Gestational diabetes   ? Hammer toe, acquired 03/15/2009  ? Heart murmur 03/02/2018  ? History of COVID-19 09/14/2020  ? Hyperglycemia 02/24/2017  ? Hyperlipidemia, mixed 03/11/2016  ? Hypothyroidism   ? Knee pain 09/26/2010  ? Crepitation on the right and minimal on the left. Left torn meniscus Right arthritis  ? Lead exposure 03/02/2018  ? Lumbar degenerative disc disease 02/24/2017  ? MRI 2019 confirms multilevel change  Sciatica at times 2/2 this and periodic radiculopathy  ? Major depressive disorder 03/08/2013  ? Melanocytic nevi of trunk   ? Menopause   ? Neck pain on left side 09/10/2018  ? Suspect this is DDD and DJD - XR is pending Neurapraxic spasm to left trapezius  ? Neurofibroma 07/20/2019  ? Nuclear sclerotic cataract of both eyes 07/23/2012  ? OAB (overactive bladder) 08/05/2016  ? Osteopenia   ? Rosacea   ? Sciatica of left side 02/24/2017  ? 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  ? By exam she has chronic tendinopathy of Glut Med  ? Urinary urgency 09/17/2020  ? Vertigo 04/12/2019  ? Vitamin D deficiency 02/28/2014  ? ? ?Past Surgical History:  ?Procedure Laterality Date  ? blephoroplasty    ? BROW LIFT    ? DILATION AND CURETTAGE OF UTERUS    ? SKIN CANCER EXCISION    ? right lower lip  ? TONSILLECTOMY AND ADENOIDECTOMY    ? ? ?Family History  ?Problem Relation Age of Onset  ? Colon polyps Mother   ? Parkinson's disease Mother   ?     developed Parkinson's disease dementia  ? Diabetes Father   ? Emphysema Father   ? Tuberculosis Father   ?  Alcohol abuse Sister   ? Cancer Sister   ?     skin cancer, melanoma cell, basil cell  ? Cancer Sister   ?     melanoma  ? Anxiety disorder Brother   ? Parkinson's disease Maternal Grandmother   ?     developed Parkinson's disease dementia  ? Colon cancer Maternal Grandfather   ? ADD / ADHD Daughter   ? Anxiety disorder Daughter   ? Depression Daughter   ? ADD / ADHD Son   ? Anxiety disorder Son   ? Depression Son   ? ? ?Social History  ? ?Socioeconomic History  ? Marital status: Single  ?  Spouse name: Not on file  ? Number of children: 2  ? Years of education: 77  ? Highest education level: Master's degree (e.g., MA, MS, MEng, MEd, MSW, MBA)  ?Occupational History  ? 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 Smoking status: Former  ?  Types: Cigarettes  ?  Quit date: 08/20/1970  ?  Years since quitting: 51.0  ? Smokeless tobacco: Never  ?Vaping Use  ? Vaping Use: Never used  ?Substance and Sexual Activity  ? Alcohol use: Yes  ?  Alcohol/week: 3.0 - 4.0 standard drinks  ?  Types: 1 - 2 Glasses of wine, 1 Cans of beer, 1 Shots of liquor per week  ?  Comment: not every night.  ? Drug use: No  ? Sexual activity: Not Currently  ?Other Topics Concern  ? Not on file  ?Social History Narrative  ? Retired from [employer].  ? No major dietary restrictions except avoid gluten.   ?   ? ?Social Determinants of Health  ? ?Financial Resource Strain: Low Risk   ? Difficulty of Paying Living Expenses: Not hard at all  ?Food Insecurity: No Food Insecurity  ? Worried About Charity fundraiser in the Last Year: Never true  ? Ran Out of Food in the Last Year: Never true  ?Transportation Needs: No Transportation Needs  ? Lack of Transportation (Medical): No  ? Lack of Transportation (Non-Medical): No  ?Physical Activity: Not on file  ?Stress: No Stress Concern Present  ? Feeling of Stress : Not at all  ?Social Connections: Moderately Integrated  ? Frequency of Communication with Friends and Family:  More than three times a week  ? Frequency of Social Gatherings with Friends and Family: More than three times a week  ? Attends Religious Services: More than 4 times per year  ? Active Member of Clubs or Organizations: Yes  ? Attends Archivist Meetings: More than 4 times per year  ? Marital Status: Divorced  ?Intimate Partner Violence: Not At Risk  ? Fear of Current or Ex-Partner: No  ? Emotionally Abused: No  ? Physically Abused: No  ? Sexually Abused: No  ? ? ?Outpatient Medications Prior to Visit  ?Medication Sig Dispense Refill  ? Albuterol Sulfate (PROAIR RESPICLICK) 299 (90 Base) MCG/ACT AEPB Inhale 2 puffs every 4-6 hours as needed for cough and shortness of breath. 1 each 0  ? dextroamphetamine (DEXTROSTAT) 10 MG tablet Take 1 tablet (10 mg total) by mouth daily. 30 tablet 0  ? escitalopram (LEXAPRO) 10 MG tablet Take 1 tablet (10 mg total) by mouth daily. 90 tablet 1  ? GAMMA AMINOBUTYRIC ACID PO Take 200 mg by mouth as needed. Up to bid    ? hydrocortisone (PROCTOZONE-HC) 2.5 % rectal cream Place 1 application rectally 2 (two) times daily. 30 g 0  ? Krill Oil 1000 MG CAPS     ? LORazepam (ATIVAN) 2 MG tablet Take 1 tablet (2 mg total) by mouth every 8 (eight) hours as needed for anxiety. 40 tablet 0  ? MAGNESIUM GLYCINATE PLUS PO Take 100 mg by mouth 2 (two) times daily.    ? nystatin ointment (MYCOSTATIN)     ? TIROSINT 88 MCG CAPS Take 1 capsule (88 mcg total) by mouth daily before breakfast. 90 capsule 1  ? ?No facility-administered medications prior to visit.  ? ? ?Allergies  ?Allergen Reactions  ? Hydrocodone-Acetaminophen Nausea And Vomiting  ? Gluten Meal Other (See Comments)  ?  GI symptoms; has BM everytime she urinates ?GI symptoms; has BM everytime she urinates  ? Oxycodone Nausea And Vomiting  ? ? ?ROS ? ?   ?Objective:  ?  ?Physical Exam ? ?LMP 03/29/2004  ?Wt Readings from Last 3 Encounters:  ?07/19/21 167 lb 12.8 oz (76.1 kg)  ?06/22/21 169 lb 9.6 oz (76.9 kg)  ?04/16/21 170 lb  12.8 oz (77.5 kg)  ? ? ?Diabetic Foot Exam - Simple   ?No data filed ?  ? ?Lab Results  ?Component Value Date  ?  WBC 4.3 01/25/2021  ? HGB 14.0 01/25/2021  ? HCT 41.3 01/25/2021  ? PLT 231.0 01/25/2021  ? GLUCOSE 70 01/25/2021  ? CHOL 183 01/25/2021  ? TRIG 89.0 01/25/2021  ? HDL 52.30 01/25/2021  ? LDLCALC 113 (H) 01/25/2021  ? ALT 14 01/25/2021  ? AST 22 01/25/2021  ? NA 138 01/25/2021  ? K 4.0 01/25/2021  ? CL 104 01/25/2021  ? CREATININE 0.99 01/25/2021  ? BUN 20 01/25/2021  ? CO2 28 01/25/2021  ? TSH 0.46 08/15/2021  ? HGBA1C 5.4 01/25/2021  ? MICROALBUR 0.2 02/28/2014  ? ? ?Lab Results  ?Component Value Date  ? TSH 0.46 08/15/2021  ? ?Lab Results  ?Component Value Date  ? WBC 4.3 01/25/2021  ? HGB 14.0 01/25/2021  ? HCT 41.3 01/25/2021  ? MCV 98.3 01/25/2021  ? PLT 231.0 01/25/2021  ? ?Lab Results  ?Component Value Date  ? NA 138 01/25/2021  ? K 4.0 01/25/2021  ? CO2 28 01/25/2021  ? GLUCOSE 70 01/25/2021  ? BUN 20 01/25/2021  ? CREATININE 0.99 01/25/2021  ? BILITOT 0.7 01/25/2021  ? ALKPHOS 64 01/25/2021  ? AST 22 01/25/2021  ? ALT 14 01/25/2021  ? PROT 6.8 01/25/2021  ? ALBUMIN 4.1 01/25/2021  ? CALCIUM 9.2 01/25/2021  ? GFR 57.83 (L) 01/25/2021  ? ?Lab Results  ?Component Value Date  ? CHOL 183 01/25/2021  ? ?Lab Results  ?Component Value Date  ? HDL 52.30 01/25/2021  ? ?Lab Results  ?Component Value Date  ? LDLCALC 113 (H) 01/25/2021  ? ?Lab Results  ?Component Value Date  ? TRIG 89.0 01/25/2021  ? ?Lab Results  ?Component Value Date  ? CHOLHDL 3 01/25/2021  ? ?Lab Results  ?Component Value Date  ? HGBA1C 5.4 01/25/2021  ? ? ?   ?Assessment & Plan:  ? ?Problem List Items Addressed This Visit   ?None ? ? ?I am having Dinna H. Heisler maintain her GAMMA AMINOBUTYRIC ACID PO, MAGNESIUM GLYCINATE PLUS PO, hydrocortisone, Krill Oil, nystatin ointment, LORazepam, escitalopram, ProAir RespiClick, Tirosint, and dextroamphetamine. ? ?No orders of the defined types were placed in this encounter. ? ? ?

## 2021-08-23 ENCOUNTER — Encounter: Payer: Self-pay | Admitting: Family Medicine

## 2021-08-23 ENCOUNTER — Other Ambulatory Visit: Payer: Self-pay | Admitting: Family Medicine

## 2021-08-23 ENCOUNTER — Ambulatory Visit (INDEPENDENT_AMBULATORY_CARE_PROVIDER_SITE_OTHER): Payer: Medicare Other | Admitting: Family Medicine

## 2021-08-23 ENCOUNTER — Encounter: Payer: Self-pay | Admitting: Internal Medicine

## 2021-08-23 ENCOUNTER — Other Ambulatory Visit: Payer: Self-pay

## 2021-08-23 VITALS — BP 124/80 | HR 62 | Resp 20 | Ht 65.0 in | Wt 167.8 lb

## 2021-08-23 DIAGNOSIS — R350 Frequency of micturition: Secondary | ICD-10-CM

## 2021-08-23 DIAGNOSIS — M8580 Other specified disorders of bone density and structure, unspecified site: Secondary | ICD-10-CM

## 2021-08-23 DIAGNOSIS — E039 Hypothyroidism, unspecified: Secondary | ICD-10-CM | POA: Diagnosis not present

## 2021-08-23 DIAGNOSIS — F900 Attention-deficit hyperactivity disorder, predominantly inattentive type: Secondary | ICD-10-CM | POA: Diagnosis not present

## 2021-08-23 DIAGNOSIS — R739 Hyperglycemia, unspecified: Secondary | ICD-10-CM

## 2021-08-23 DIAGNOSIS — E782 Mixed hyperlipidemia: Secondary | ICD-10-CM

## 2021-08-23 DIAGNOSIS — F330 Major depressive disorder, recurrent, mild: Secondary | ICD-10-CM

## 2021-08-23 DIAGNOSIS — E559 Vitamin D deficiency, unspecified: Secondary | ICD-10-CM | POA: Diagnosis not present

## 2021-08-23 DIAGNOSIS — R252 Cramp and spasm: Secondary | ICD-10-CM

## 2021-08-23 DIAGNOSIS — K5900 Constipation, unspecified: Secondary | ICD-10-CM | POA: Insufficient documentation

## 2021-08-23 MED ORDER — DEXTROAMPHETAMINE SULFATE 10 MG PO TABS: 10.0000 mg | ORAL_TABLET | Freq: Every day | ORAL | 0 refills | Status: DC

## 2021-08-23 MED ORDER — LORAZEPAM 2 MG PO TABS: 2.0000 mg | ORAL_TABLET | Freq: Three times a day (TID) | ORAL | 0 refills | Status: DC | PRN

## 2021-08-23 MED ORDER — TIROSINT 88 MCG PO CAPS: 88.0000 ug | ORAL_CAPSULE | Freq: Every day | ORAL | 1 refills | Status: DC

## 2021-08-23 NOTE — Telephone Encounter (Signed)
Patient comment: Leaving 5/2 and will need soon after my return on 5/23. Shannon [name] I refill now? ? ?Requesting: dextrostat ?Contract: need ?UDS: need ?Last Visit: 04/16/21 ?Next Visit: 08/23/21 today ?Last Refill: 08/06/21 ? ?Please Advise ? ?

## 2021-08-23 NOTE — Assessment & Plan Note (Addendum)
Uses Lorazepam sparingly for anxiety and insomnia and it is helpful. Refill is given. Doing well on daily Escitalopram no changes ?

## 2021-08-23 NOTE — Assessment & Plan Note (Signed)
Started as her dose of Levothyroxine was lowered. Has tried Psyllium and other OTC meds ie ColonMax but after several days diarrhea occurs. Try Miralax and Psyllium every day. Encouraged increased hydration and fiber in diet. Daily probiotics. If bowels not moving can use MOM 2 tbls po in 4 oz of warm prune juice by mouth every 2-3 days. If no results then repeat in 4 hours with  Dulcolax suppository pr, Scotlyn repeat again in 4 more hours as needed. Seek care if symptoms worsen. Consider daily Miralax and/or Dulcolax if symptoms persist.  ?

## 2021-08-23 NOTE — Progress Notes (Signed)
? ?Subjective:  ? ?By signing my name below, I, [name], attest that this documentation has been prepared under the direction and in the presence of Mosie Lukes, MD. 08/23/2021 ? ? Patient ID: Shannon [name], female    DOB: 12/07/1950, 71 y.o.   MRN: [id number] ? ?Chief Complaint  ?Patient presents with  ? Follow-up  ? ? ?HPI ?Patient is in today for an office visit. ? ?She reports that after the dosage of her thyroid medication was lowered November 2022, she developed constipation. She used powdered psyllium husks twice a day and different medications to promote bowel movements but none of them have been working for her. She was told to see a gastroenterologist.  ? ?She has tried several pessaries to help with rectocele but they have never worked for her. She is unsure about surgery at this time.  ? ?She is requesting for a refill on 10 mg dextrostat and  20 mg lexapro. ? ?She thinks the "brain fog"is getting worse and does not know what to do at this time.  ? ?She complains of a corn on her left toe that is tender. She is trying to wear more comfortable shoes. She is not interested in seeing a podiatrist at this time[ ? ?Past Medical History:  ?Diagnosis Date  ? 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 Anxiety   ? Asthma   ? Basal cell carcinoma of face   ? Benign neoplasm of skin of lower limb   ? Bilateral carotid bruits 04/12/2019  ? Diverticulosis   ? Gestational diabetes   ? Hammer toe, acquired 03/15/2009  ? Heart murmur 03/02/2018  ? History of COVID-19 09/14/2020  ? Hyperglycemia 02/24/2017  ? Hyperlipidemia, mixed 03/11/2016  ? Hypothyroidism   ? Knee pain 09/26/2010  ? Crepitation on the right and minimal on the left. Left torn meniscus Right arthritis  ? Lead exposure 03/02/2018  ? Lumbar degenerative disc disease 02/24/2017  ? MRI 2019 confirms multilevel change  Sciatica at times 2/2 this and periodic radiculopathy  ? Major depressive disorder 03/08/2013  ?  Melanocytic nevi of trunk   ? Menopause   ? Neck pain on left side 09/10/2018  ? Suspect this is DDD and DJD - XR is pending Neurapraxic spasm to left trapezius  ? Neurofibroma 07/20/2019  ? Nuclear sclerotic cataract of both eyes 07/23/2012  ? OAB (overactive bladder) 08/05/2016  ? Osteopenia   ? Rosacea   ? Sciatica of left side 02/24/2017  ? 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  ? By exam she has chronic tendinopathy of Glut Med  ? Urinary urgency 09/17/2020  ? Vertigo 04/12/2019  ? Vitamin D deficiency 02/28/2014  ? ? ?Past Surgical History:  ?Procedure Laterality Date  ? blephoroplasty    ? BROW LIFT    ? DILATION AND CURETTAGE OF UTERUS    ? SKIN CANCER EXCISION    ? right lower lip  ? TONSILLECTOMY AND ADENOIDECTOMY    ? ? ?Family History  ?Problem Relation Age of Onset  ? Colon polyps Mother   ? Parkinson's disease Mother   ?     developed Parkinson's disease dementia  ? Diabetes Father   ? Emphysema Father   ? Tuberculosis Father   ? Alcohol abuse Sister   ? Cancer Sister   ?     skin cancer, melanoma cell, basil cell  ? Cancer Sister   ?     melanoma  ? Anxiety disorder  Brother   ? Parkinson's disease Maternal Grandmother   ?     developed Parkinson's disease dementia  ? Colon cancer Maternal Grandfather   ? ADD / ADHD Daughter   ? Anxiety disorder Daughter   ? Depression Daughter   ? ADD / ADHD Son   ? Anxiety disorder Son   ? Depression Son   ? ? ?Social History  ? ?Socioeconomic History  ? Marital status: Single  ?  Spouse name: Not on file  ? Number of children: 2  ? Years of education: 51  ? Highest education level: Master's degree (e.g., MA, MS, MEng, MEd, MSW, MBA)  ?Occupational History  ? 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 Smoking status: Former  ?  Types: Cigarettes  ?  Quit date: 08/20/1970  ?  Years since quitting: 51.0  ? Smokeless tobacco: Never  ?Vaping Use  ? Vaping Use: Never used  ?Substance and Sexual Activity  ? Alcohol use: Yes  ?   Alcohol/week: 3.0 - 4.0 standard drinks  ?  Types: 1 - 2 Glasses of wine, 1 Cans of beer, 1 Shots of liquor per week  ?  Comment: not every night.  ? Drug use: No  ? Sexual activity: Not Currently  ?Other Topics Concern  ? Not on file  ?Social History Narrative  ? Retired from [employer].  ? No major dietary restrictions except avoid gluten.   ?   ? ?Social Determinants of Health  ? ?Financial Resource Strain: Low Risk   ? Difficulty of Paying Living Expenses: Not hard at all  ?Food Insecurity: No Food Insecurity  ? Worried About Charity fundraiser in the Last Year: Never true  ? Ran Out of Food in the Last Year: Never true  ?Transportation Needs: No Transportation Needs  ? Lack of Transportation (Medical): No  ? Lack of Transportation (Non-Medical): No  ?Physical Activity: Not on file  ?Stress: No Stress Concern Present  ? Feeling of Stress : Not at all  ?Social Connections: Moderately Integrated  ? Frequency of Communication with Friends and Family: More than three times a week  ? Frequency of Social Gatherings with Friends and Family: More than three times a week  ? Attends Religious Services: More than 4 times per year  ? Active Member of Clubs or Organizations: Yes  ? Attends Archivist Meetings: More than 4 times per year  ? Marital Status: Divorced  ?Intimate Partner Violence: Not At Risk  ? Fear of Current or Ex-Partner: No  ? Emotionally Abused: No  ? Physically Abused: No  ? Sexually Abused: No  ? ? ?Outpatient Medications Prior to Visit  ?Medication Sig Dispense Refill  ? Albuterol Sulfate (PROAIR RESPICLICK) 053 (90 Base) MCG/ACT AEPB Inhale 2 puffs every 4-6 hours as needed for cough and shortness of breath. 1 each 0  ? dextroamphetamine (DEXTROSTAT) 10 MG tablet Take 1 tablet (10 mg total) by mouth daily. 30 tablet 0  ? escitalopram (LEXAPRO) 10 MG tablet Take 1 tablet (10 mg total) by mouth daily. 90 tablet 1  ? GAMMA AMINOBUTYRIC ACID PO Take 200 mg by mouth as needed. Up to bid     ? hydrocortisone (PROCTOZONE-HC) 2.5 % rectal cream Place 1 application rectally 2 (two) times daily. 30 g 0  ? Krill Oil 1000 MG CAPS     ? MAGNESIUM GLYCINATE PLUS PO Take 100 mg by mouth 2 (two) times daily.    ?  nystatin ointment (MYCOSTATIN)     ? TIROSINT 88 MCG CAPS Take 1 capsule (88 mcg total) by mouth daily before breakfast. 90 capsule 1  ? LORazepam (ATIVAN) 2 MG tablet Take 1 tablet (2 mg total) by mouth every 8 (eight) hours as needed for anxiety. 40 tablet 0  ? levothyroxine (SYNTHROID) 88 MCG tablet Take by mouth.    ? ?No facility-administered medications prior to visit.  ? ? ?Allergies  ?Allergen Reactions  ? Hydrocodone-Acetaminophen Nausea And Vomiting  ? Gluten Meal Other (See Comments)  ?  GI symptoms; has BM everytime she urinates ?GI symptoms; has BM everytime she urinates  ? Oxycodone Nausea And Vomiting  ? ? ?Review of Systems  ?Constitutional:  Negative for fever and malaise/fatigue.  ?HENT:  Negative for congestion.   ?Eyes:  Negative for redness.  ?Respiratory:  Negative for shortness of breath.   ?Cardiovascular:  Negative for chest pain, palpitations and leg swelling.  ?Gastrointestinal:  Positive for constipation. Negative for abdominal pain, blood in stool and nausea.  ?Genitourinary:  Negative for dysuria and frequency.  ?Musculoskeletal:  Negative for falls.  ?Skin:  Negative for rash.  ?     (+) corn on left foot  ?Neurological:  Negative for dizziness, loss of consciousness and headaches.  ?Endo/Heme/Allergies:  Negative for polydipsia.  ?Psychiatric/Behavioral:  Negative for depression. The patient is not nervous/anxious.   ? ?   ?Objective:  ?  ?Physical Exam ?Constitutional:   ?   General: She is not in acute distress. ?   Appearance: She is well-developed.  ?HENT:  ?   Head: Normocephalic and atraumatic.  ?Eyes:  ?   Conjunctiva/sclera: Conjunctivae normal.  ?Neck:  ?   Thyroid: No thyromegaly.  ?Cardiovascular:  ?   Rate and Rhythm: Normal rate and regular rhythm.  ?    Heart sounds: Normal heart sounds. No murmur heard. ?Pulmonary:  ?   Effort: Pulmonary effort is normal. No respiratory distress.  ?   Breath sounds: Normal breath sounds.  ?Abdominal:  ?   General: Bowel sounds are

## 2021-08-23 NOTE — Assessment & Plan Note (Signed)
hgba1c acceptable, minimize simple carbs. Increase exercise as tolerated.  

## 2021-08-23 NOTE — Patient Instructions (Signed)
Encouraged increased hydration and fiber in diet. Daily probiotics. If bowels not moving can use MOM 2 tbls po in 4 oz of warm prune juice by mouth every 2-3 days. If no results then repeat in 4 hours with  Dulcolax suppository pr, Ocie repeat again in 4 more hours as needed. Seek care if symptoms worsen. Consider daily Miralax and/or Dulcolax if symptoms persist.   ? ?Constipation, Adult ?Constipation is when a person has fewer than three bowel movements in a week, has difficulty having a bowel movement, or has stools (feces) that are dry, hard, or larger than normal. Constipation Elgene be caused by an underlying condition. It Kynli become worse with age if a person takes certain medicines and does not take in enough fluids. ?Follow these instructions at home: ?Eating and drinking ? ?Eat foods that have a lot of fiber, such as beans, whole grains, and fresh fruits and vegetables. ?Limit foods that are low in fiber and high in fat and processed sugars, such as fried or sweet foods. These include french fries, hamburgers, cookies, candies, and soda. ?Drink enough fluid to keep your urine pale yellow. ?General instructions ?Exercise regularly or as told by your health care provider. Try to do 150 minutes of moderate exercise each week. ?Use the bathroom when you have the urge to go. Do not hold it in. ?Take over-the-counter and prescription medicines only as told by your health care provider. This includes any fiber supplements. ?During bowel movements: ?Practice deep breathing while relaxing the lower abdomen. ?Practice pelvic floor relaxation. ?Watch your condition for any changes. Let your health care provider know about them. ?Keep all follow-up visits as told by your health care provider. This is important. ?Contact a health care provider if: ?You have pain that gets worse. ?You have a fever. ?You do not have a bowel movement after 4 days. ?You vomit. ?You are not hungry or you lose weight. ?You are bleeding from the  opening between the buttocks (anus). ?You have thin, pencil-like stools. ?Get help right away if: ?You have a fever and your symptoms suddenly get worse. ?You leak stool or have blood in your stool. ?Your abdomen is bloated. ?You have severe pain in your abdomen. ?You feel dizzy or you faint. ?Summary ?Constipation is when a person has fewer than three bowel movements in a week, has difficulty having a bowel movement, or has stools (feces) that are dry, hard, or larger than normal. ?Eat foods that have a lot of fiber, such as beans, whole grains, and fresh fruits and vegetables. ?Drink enough fluid to keep your urine pale yellow. ?Take over-the-counter and prescription medicines only as told by your health care provider. This includes any fiber supplements. ?This information is not intended to replace advice given to you by your health care provider. Make sure you discuss any questions you have with your health care provider. ?Document Revised: 03/03/2019 Document Reviewed: 03/03/2019 ?Elsevier Patient Education ? [place]. ? ?

## 2021-08-23 NOTE — Assessment & Plan Note (Signed)
Allowed refill on Dextrostat doing well ?

## 2021-08-23 NOTE — Assessment & Plan Note (Signed)
Supplement and monitor 

## 2021-08-23 NOTE — Assessment & Plan Note (Signed)
Encourage heart healthy diet such as MIND or DASH diet, increase exercise, avoid trans fats, simple carbohydrates and processed foods, consider a krill or fish or flaxseed oil cap daily.  °

## 2021-08-23 NOTE — Assessment & Plan Note (Signed)
On Levothyroxine, continue to monitor. Is following with endocrinology now and her dose was lowered and she developed constipation.  ?

## 2021-08-24 LAB — COMPREHENSIVE METABOLIC PANEL
ALT: 18 U/L (ref 0–35)
AST: 26 U/L (ref 0–37)
Albumin: 4.4 g/dL (ref 3.5–5.2)
Alkaline Phosphatase: 66 U/L (ref 39–117)
BUN: 16 mg/dL (ref 6–23)
CO2: 30 mEq/L (ref 19–32)
Calcium: 9.4 mg/dL (ref 8.4–10.5)
Chloride: 103 mEq/L (ref 96–112)
Creatinine, Ser: 1.17 mg/dL (ref 0.40–1.20)
GFR: 47.13 mL/min — ABNORMAL LOW (ref >60.00)
Glucose, Bld: 84 mg/dL (ref 70–99)
Potassium: 5 mEq/L (ref 3.5–5.1)
Sodium: 140 mEq/L (ref 135–145)
Total Bilirubin: 1 mg/dL (ref 0.2–1.2)
Total Protein: 6.5 g/dL (ref 6.0–8.3)

## 2021-08-24 LAB — CBC
HCT: 38.9 % (ref 36.0–46.0)
Hemoglobin: 13.3 g/dL (ref 12.0–15.0)
MCHC: 34.3 g/dL (ref 30.0–36.0)
MCV: 97.8 fl (ref 78.0–100.0)
Platelets: 235 10*3/uL (ref 150.0–400.0)
RBC: 3.97 Mil/uL (ref 3.87–5.11)
RDW: 12.3 % (ref 11.5–15.5)
WBC: 4.8 10*3/uL (ref 4.0–10.5)

## 2021-08-24 LAB — URINALYSIS
Bilirubin Urine: NEGATIVE
Hgb urine dipstick: NEGATIVE
Leukocytes,Ua: NEGATIVE
Nitrite: NEGATIVE
Specific Gravity, Urine: 1.015 (ref 1.000–1.030)
Total Protein, Urine: NEGATIVE
Urine Glucose: NEGATIVE
Urobilinogen, UA: 0.2 (ref 0.0–1.0)
pH: 7.5 (ref 5.0–8.0)

## 2021-08-24 LAB — LIPID PANEL
Cholesterol: 194 mg/dL (ref 0–200)
HDL: 57.1 mg/dL (ref >39.00)
LDL Cholesterol: 106 mg/dL — ABNORMAL HIGH (ref 0–99)
NonHDL: 136.85
Total CHOL/HDL Ratio: 3
Triglycerides: 155 mg/dL — ABNORMAL HIGH (ref 0.0–149.0)
VLDL: 31 mg/dL (ref 0.0–40.0)

## 2021-08-24 LAB — URINE CULTURE
MICRO NUMBER:: 13320996
Result:: NO GROWTH
SPECIMEN QUALITY:: ADEQUATE

## 2021-08-24 LAB — HEMOGLOBIN A1C: Hgb A1c MFr Bld: 5.4 % (ref 4.6–6.5)

## 2021-08-24 LAB — MAGNESIUM: Magnesium: 2.2 mg/dL (ref 1.5–2.5)

## 2021-08-25 LAB — DRUG MONITORING PANEL 376104, URINE
Amphetamines: NEGATIVE ng/mL (ref <500)
Barbiturates: NEGATIVE ng/mL (ref <300)
Benzodiazepines: NEGATIVE ng/mL (ref <100)
Cocaine Metabolite: NEGATIVE ng/mL (ref <150)
Desmethyltramadol: NEGATIVE ng/mL (ref <100)
Opiates: NEGATIVE ng/mL (ref <100)
Oxycodone: NEGATIVE ng/mL (ref <100)
Tramadol: NEGATIVE ng/mL (ref <100)

## 2021-08-25 LAB — DM TEMPLATE

## 2021-09-21 DIAGNOSIS — M542 Cervicalgia: Secondary | ICD-10-CM | POA: Diagnosis not present

## 2021-09-28 ENCOUNTER — Encounter: Payer: Self-pay | Admitting: Internal Medicine

## 2021-10-09 DIAGNOSIS — M542 Cervicalgia: Secondary | ICD-10-CM | POA: Diagnosis not present

## 2021-10-17 ENCOUNTER — Other Ambulatory Visit: Payer: Self-pay | Admitting: Family Medicine

## 2021-10-17 ENCOUNTER — Encounter: Payer: Self-pay | Admitting: Family Medicine

## 2021-10-17 MED ORDER — DEXTROAMPHETAMINE SULFATE 10 MG PO TABS: 10.0000 mg | ORAL_TABLET | Freq: Every day | ORAL | 0 refills | Status: DC

## 2021-10-17 NOTE — Telephone Encounter (Signed)
Requesting: dextrostat '10mg'$  Contract: 03/02/2018 UDS: 08/23/21 Last Visit: 08/23/21 Next Visit: 03/05/22 Last Refill: 08/23/21 #30 and 0RF  Please Advise

## 2021-10-22 NOTE — Telephone Encounter (Signed)
Looks like ordered was canceled. HM Dexa was entered.

## 2021-11-09 ENCOUNTER — Other Ambulatory Visit: Payer: Self-pay

## 2021-11-09 MED ORDER — ESCITALOPRAM OXALATE 10 MG PO TABS: 10.0000 mg | ORAL_TABLET | Freq: Every day | ORAL | 1 refills | Status: DC

## 2021-11-22 ENCOUNTER — Other Ambulatory Visit: Payer: Self-pay | Admitting: Family Medicine

## 2021-11-22 MED ORDER — DEXTROAMPHETAMINE SULFATE 10 MG PO TABS: 10.0000 mg | ORAL_TABLET | Freq: Every day | ORAL | 0 refills | Status: DC

## 2021-12-27 ENCOUNTER — Other Ambulatory Visit: Payer: Self-pay | Admitting: Family Medicine

## 2021-12-28 MED ORDER — DEXTROAMPHETAMINE SULFATE 10 MG PO TABS: 10.0000 mg | ORAL_TABLET | Freq: Every day | ORAL | 0 refills | Status: DC

## 2021-12-28 NOTE — Telephone Encounter (Signed)
Requesting: Dextrostat '10mg'$   Contract:03/02/2018 UDS:08/23/21 Last Visit: 08/23/21 Next Visit: 03/05/22 Last Refill: 11/22/21 #30 and 0RF  Please Advise

## 2022-01-22 ENCOUNTER — Other Ambulatory Visit: Payer: Self-pay | Admitting: Family Medicine

## 2022-01-22 MED ORDER — DEXTROAMPHETAMINE SULFATE 10 MG PO TABS: 10.0000 mg | ORAL_TABLET | Freq: Every day | ORAL | 0 refills | Status: DC

## 2022-01-22 NOTE — Telephone Encounter (Signed)
Requesting: dextroamphetamine '10mg'$   Contract:03/02/2018 UDS:08/23/21 Last Visit: 08/23/21 Next Visit:03/05/22 Last Refill: 12/28/21 #30 and 0RF   Please Advise

## 2022-01-24 DIAGNOSIS — Z23 Encounter for immunization: Secondary | ICD-10-CM | POA: Diagnosis not present

## 2022-01-30 DIAGNOSIS — N816 Rectocele: Secondary | ICD-10-CM | POA: Diagnosis not present

## 2022-01-30 DIAGNOSIS — E063 Autoimmune thyroiditis: Secondary | ICD-10-CM | POA: Diagnosis not present

## 2022-01-30 DIAGNOSIS — G4762 Sleep related leg cramps: Secondary | ICD-10-CM | POA: Diagnosis not present

## 2022-01-30 DIAGNOSIS — K5900 Constipation, unspecified: Secondary | ICD-10-CM | POA: Diagnosis not present

## 2022-02-07 ENCOUNTER — Encounter: Payer: Self-pay | Admitting: Family Medicine

## 2022-02-07 ENCOUNTER — Other Ambulatory Visit: Payer: Self-pay

## 2022-02-07 MED ORDER — ESCITALOPRAM OXALATE 10 MG PO TABS: 10.0000 mg | ORAL_TABLET | Freq: Every day | ORAL | 1 refills | Status: DC

## 2022-02-13 ENCOUNTER — Encounter: Payer: Self-pay | Admitting: Family Medicine

## 2022-02-13 ENCOUNTER — Ambulatory Visit: Payer: Medicare Other | Admitting: Obstetrics and Gynecology

## 2022-02-14 ENCOUNTER — Other Ambulatory Visit: Payer: Self-pay | Admitting: Family Medicine

## 2022-02-14 ENCOUNTER — Encounter: Payer: Self-pay | Admitting: Internal Medicine

## 2022-02-14 ENCOUNTER — Encounter: Payer: Self-pay | Admitting: Family Medicine

## 2022-02-14 ENCOUNTER — Other Ambulatory Visit (HOSPITAL_BASED_OUTPATIENT_CLINIC_OR_DEPARTMENT_OTHER): Payer: Self-pay

## 2022-02-14 ENCOUNTER — Ambulatory Visit (INDEPENDENT_AMBULATORY_CARE_PROVIDER_SITE_OTHER): Payer: Medicare Other | Admitting: Internal Medicine

## 2022-02-14 VITALS — BP 124/75 | HR 60 | Ht 65.0 in | Wt 170.0 lb

## 2022-02-14 DIAGNOSIS — E559 Vitamin D deficiency, unspecified: Secondary | ICD-10-CM

## 2022-02-14 DIAGNOSIS — E063 Autoimmune thyroiditis: Secondary | ICD-10-CM | POA: Diagnosis not present

## 2022-02-14 DIAGNOSIS — Z23 Encounter for immunization: Secondary | ICD-10-CM | POA: Diagnosis not present

## 2022-02-14 MED ORDER — COMIRNATY 30 MCG/0.3ML IM SUSY
PREFILLED_SYRINGE | INTRAMUSCULAR | 0 refills | Status: DC
  Filled 2022-02-14: qty 0.3, 1d supply, fill #0

## 2022-02-14 NOTE — Progress Notes (Signed)
Name: Shannon [name]  MRN/ DOB: [id number], 1950-12-14    Age/ Sex: 71 y.o., female    PCP: Mosie Lukes, MD   Reason for Endocrinology Evaluation: Hypothyroid     Date of Initial Endocrinology Evaluation: 02/12/2021    HPI: Shannon [name] is a 71 y.o. female with a past medical history of Hypothyroidism, gluten intolerance,Asthma , ADHD. The patient presented for initial endocrinology clinic visit on 02/14/2022 for consultative assistance with her hypothyroid.   She has been diagnosed with hypothyroidism in 2005.   She was following with an [employer] manager in [place] . She is on Tirosint 100 mcg daily at night AND Desiccated thyroid 7.5 mg daily, has been on this regimen for ~ 10 yrs   She has gluten intolerance but no celiac disease diagnosis  Historically her thyroid replacement therapy has been adjusted based on her symptoms, she had noted during the summer that she has depression and leg cramps as well as occasional sleeping issues and would decrease thyroid hormone replacement therapy during the summer season to half a tablet once a week and 1 tablet the rest of the week.  In the fall she tends to go up to taking 1 tablet daily and has noted improvement in her symptoms.  She is on Vitamin D replacement . Takes 4000 iu daily in the winter and 2000 iu daily in the summer   On her initial visit we stopped compound thyroid hormone 7.5 mcg and continued Tirosint 100 mcg with a TSH 0.09 uIU/mL    Anti-TPO Abs elevated 565.6 01/2022   No Fh of thyroid disease   Father with T1DM   SUBJECTIVE:    Today (02/14/22):  Shannon [name] is here for a follow up on hypothyroidism  While she was in [place] this summer she visited an alternative health provider Labs were done, see below  Weight stable  She has constipated , has a rectocele which has improved over the past 2 months  due to water intake and fiber , probiotics  Denies palpitations Denies local neck swelling  Denies  tremors  Anxiety is stable  Continues with cramps , on magnesium  Has difficulty maintaining sleep   She was on 6.5 tabs  in June and went back to 7 tabs September   She is on Vitamin D 4000 iu daily Tirosint 88 mcg daily     HISTORY:  Past Medical History:  Past Medical History:  Diagnosis Date   ADHD (attention deficit hyperactivity disorder), 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 Left torn meniscus Right arthritis   Lead exposure 03/02/2018   Lumbar degenerative disc disease 02/24/2017   MRI 2019 confirms multilevel change  Sciatica at times 2/2 this and periodic radiculopathy   Major depressive disorder 03/08/2013   Melanocytic nevi of trunk    Menopause    Neck pain on left side 09/10/2018   Suspect this is DDD and DJD - XR is pending Neurapraxic spasm to left trapezius   Neurofibroma  07/20/2019   Nuclear sclerotic cataract of both eyes 07/23/2012   OAB (overactive bladder) 08/05/2016   Osteopenia    Rosacea    Sciatica of left side 02/24/2017   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   By exam she has chronic tendinopathy of Glut Med   Urinary urgency 09/17/2020   Vertigo 04/12/2019   Vitamin D deficiency 02/28/2014   Past Surgical History:  Past Surgical History:  Procedure Laterality Date   blephoroplasty     BROW LIFT     DILATION AND CURETTAGE OF UTERUS     SKIN CANCER EXCISION     right lower lip   TONSILLECTOMY AND ADENOIDECTOMY      Social History:  reports that she quit smoking about 51 years ago. Her smoking use included  cigarettes. She has never used smokeless tobacco. She reports current alcohol use of about 3.0 - 4.0 standard drinks of alcohol per week. She reports that she does not use drugs. Family History: family history includes ADD / ADHD in her daughter and son; Alcohol abuse in her sister; Anxiety disorder in her brother, daughter, and son; Cancer in her sister and sister; Colon cancer in her maternal grandfather; Colon polyps in her mother; Depression in her daughter and son; Diabetes in her father; Emphysema in her father; Parkinson's disease in her maternal grandmother and mother; Tuberculosis in her father.   HOME MEDICATIONS: Allergies as of 02/14/2022       Reactions   Hydrocodone-acetaminophen Nausea And Vomiting   Gluten Meal Other (See Comments)   GI symptoms; has BM everytime she urinates GI symptoms; has BM everytime she urinates   Oxycodone Nausea And Vomiting        Medication List        Accurate as of February 14, 2022 10:31 AM. If you have any questions, ask your nurse or doctor.          dextroamphetamine 10 MG tablet Commonly known as: DEXTROSTAT Take 1 tablet (10 mg total) by mouth daily.   escitalopram 10 MG tablet Commonly known as: Lexapro Take 1 tablet (10 mg total) by mouth daily.   GAMMA AMINOBUTYRIC ACID PO Take 200 mg by mouth as needed. Up to bid   hydrocortisone 2.5 % rectal cream Commonly known as: Proctozone-HC Place 1 application rectally 2 (two) times daily.   Krill Oil 1000 MG Caps   LORazepam 2 MG tablet Commonly known as: ATIVAN Take 1 tablet (2 mg total) by mouth every 8 (eight) hours as needed for anxiety.   MAGNESIUM GLYCINATE PLUS PO Take 100 mg by mouth 2 (two) times daily.   nystatin ointment Commonly known as: MYCOSTATIN   ProAir RespiClick 433 (90 Base) MCG/ACT Aepb Generic drug: Albuterol Sulfate Inhale 2 puffs every 4-6 hours as needed for cough and shortness of breath.   Tirosint 88 MCG Caps Generic drug: Levothyroxine  Sodium Take 1 capsule (88 mcg total) by mouth daily before breakfast. What changed: Another medication with the same name was removed. Continue taking this medication, and follow the directions you see here. Changed by: Dorita Sciara, MD          REVIEW OF SYSTEMS: A comprehensive ROS was conducted with the patient and is negative except as per HPI     OBJECTIVE:  VS: BP 124/75 (BP Location: Left Arm, Patient Position: Sitting, Cuff Size: Small)   Pulse 60   Ht '5\' 5"'$  (1.651 m)   Wt 170 lb (77.1 kg)   LMP  03/29/2004   SpO2 97%   BMI 28.29 kg/m    Wt Readings from Last 3 Encounters:  02/14/22 170 lb (77.1 kg)  08/23/21 167 lb 12.8 oz (76.1 kg)  07/19/21 167 lb 12.8 oz (76.1 kg)     EXAM: General: Pt appears well and is in NAD  Neck: General: Supple without adenopathy. Thyroid: Thyroid size normal.  No goiter or nodules appreciated. No thyroid bruit.  Lungs: Clear with good BS bilat with no rales, rhonchi, or wheezes  Heart: Auscultation: RRR.  Abdomen: Normoactive bowel sounds, soft, nontender, without masses or organomegaly palpable  Extremities:  BL LE: No pretibial edema normal ROM and strength.  Mental Status: Judgment, insight: Intact Orientation: Oriented to time, place, and person Mood and affect: No depression, anxiety, or agitation     DATA REVIEWED: 01/30/2022  TSH 0.903 uIU/mL  ATI-TPO Ab s 565.6  (reference <100)    Latest Reference Range & Units 06/22/21 08:30  VITD 30.00 - 100.00 ng/mL 75.93    ASSESSMENT/PLAN/RECOMMENDATIONS:   Hashimoto's Thyroiditis :   - Pt with non-specific symptoms  - No local neck symptoms -TSH is at the lower end of normal, due to difficulty maintaining sleep, she would like to try cutting her to a send, which I am fine with, she will return in 7 weeks for repeat labs     Medications : Continue Tirosint 88 mcg , half a capsule on Friday and 1 tablet the rest of the week   2. Hx vitamin D  deficiency:  -Vitamin D continues to be within the normal range -She alternates vitamin dose during summer and winter months -Currently  is on 4000 IU daily   Medication Continue Vitamin D3 4000 IU daily     Follow-up in 6 months    Signed electronically by: Mack Guise, MD  [facility] Endocrinology  [facility] [address] [place], [place] [address] Phone: [phone] FAX: [phone]   CC: Mosie Lukes, [address] [place] [address] Phone: [phone] Fax: [phone]   Return to Endocrinology clinic as below: Future Appointments  Date Time Provider [place]  02/27/2022 11:40 AM Berton Mount, NP [facility]  03/05/2022  2:20 PM Mosie Lukes, MD [facility]  07/31/2022  2:20 PM [facility]

## 2022-02-14 NOTE — Telephone Encounter (Signed)
Requesting: dextrostat '10mg'$   Contract: 03/02/2018 UDS: 08/23/21 Last Visit: 08/23/21 Next Visit: 03/05/22 Last Refill: 01/22/22 #30 and 0RF   Please Advise

## 2022-02-14 NOTE — Patient Instructions (Addendum)
Tirosint 88 mcg , half a tablet on Fridays  , 1 tablet rest of the week

## 2022-02-16 ENCOUNTER — Other Ambulatory Visit: Payer: Self-pay | Admitting: Family Medicine

## 2022-02-18 ENCOUNTER — Encounter: Payer: Self-pay | Admitting: *Deleted

## 2022-02-18 NOTE — Telephone Encounter (Signed)
Patient is requesting a refill of the following medications: Requested Prescriptions   Pending Prescriptions Disp Refills   LORazepam (ATIVAN) 2 MG tablet [Pharmacy Med Name: lorazepam 2 mg tablet] 40 tablet 0    Sig: Take 1 tablet (2 mg total) by mouth every 8 (eight) hours as needed for anxiety.   Contract: 08/23/21 UDS:08/23/21  Next Visit:03/05/22 Last Refill: 08/23/21  Please Advise

## 2022-02-20 DIAGNOSIS — M25551 Pain in right hip: Secondary | ICD-10-CM | POA: Diagnosis not present

## 2022-02-20 DIAGNOSIS — M545 Low back pain, unspecified: Secondary | ICD-10-CM | POA: Diagnosis not present

## 2022-02-20 DIAGNOSIS — M4120 Other idiopathic scoliosis, site unspecified: Secondary | ICD-10-CM | POA: Diagnosis not present

## 2022-02-21 ENCOUNTER — Ambulatory Visit (INDEPENDENT_AMBULATORY_CARE_PROVIDER_SITE_OTHER): Payer: Medicare Other | Admitting: Sports Medicine

## 2022-02-21 VITALS — BP 135/82 | Ht 65.0 in | Wt 165.0 lb

## 2022-02-21 DIAGNOSIS — M5136 Other intervertebral disc degeneration, lumbar region: Secondary | ICD-10-CM | POA: Diagnosis not present

## 2022-02-21 MED ORDER — GABAPENTIN 300 MG PO CAPS: 300.0000 mg | ORAL_CAPSULE | Freq: Three times a day (TID) | ORAL | 2 refills | Status: DC

## 2022-02-21 NOTE — Progress Notes (Addendum)
Shannon [name] - 71 y.o. female MRN [id number]  Date of birth: 1950-08-12    CHIEF COMPLAINT:   Back pain    SUBJECTIVE:   HPI:  Pleasant 71 year old female comes to clinic to be evaluated for low back pain.  She has a history of chronic back pains and known scoliosis and degenerative disc disease.  She was last seen for this in 2019 here.  That time she was having sciatica on the left side.  She did PT for this and improved and this is now not a current problem.  Today she comes in to discuss right-sided back pain that has been present for about a week.  Last week she returned back to [place] from [place].  She sat in a car for many hours.  Few days after she got back she started having right lower lumbar muscle spasms.  She tried taking some old gabapentin, ibuprofen, and using Voltaren, and heating pad.  Has not gotten any better.  Her pain is worse with walking.  She denies any numbness or tingling down the leg.  However, she does have some radiation of the pain from the right low back near the right groin.  She went to [facility] yesterday for this.  She had an x-ray that showed degenerative changes and scoliosis in the spine.  She also got a intramuscular shot of Solu-Medrol.  She has not really gotten any relief from this yet.  She is here today to follow-up.  ROS:     See HPI  PERTINENT  PMH / PSH FH / / SH:  Past Medical, Surgical, Social, and Family History Reviewed & Updated in the EMR.  Pertinent findings include:  Scoliosis, degenerative disc disease  OBJECTIVE: BP 135/82   Ht '5\' 5"'$  (1.651 m)   Wt 165 lb (74.8 kg)   LMP 03/29/2004   BMI 27.46 kg/m   Physical Exam:  Vital signs are reviewed.  GEN: Alert and oriented, NAD Pulm: Breathing unlabored PSY: normal mood, congruent affect  MSK: Lumbar spine -no obvious deformity.  Nontender to palpation along lumbar spinous processes, no bony step-off.  Range of motion in flexion and extension is limited by pain.   No issues with side bending or twisting.  LLE -no obvious deformity.  Nontender to palpation along iliac crest or greater trochanter.  Full range of motion in hip flexion.  5/5 strength in flexion and abduction.  Negative straight leg raise.  Negative Stinchfield.  Negative logroll.  RLE - no obvious deformity.  Nontender to palpation along iliac crest or greater trochanter.  Full range of motion in hip flexion.  5/5 strength in flexion and abduction.  Negative straight leg raise.  Negative Stinchfield.  Negative logroll.  Gait analysis-she does have hip drop on the right side  ASSESSMENT & PLAN:  1.  Low back pain, likely secondary to degenerative disc disease -History and exam is most consistent with low back pain from degenerative disc disease.  This most likely represents an L1 radiculopathy.  We will try to get relief by restarting her on gabapentin 300 mg 3 times daily.  She also take ibuprofen and use a heating pad as needed.  Will have her work on easy range of motion exercises to work on flexion, extension, side bending and twisting.  We will have her follow-up in 1 to 2 weeks to see how she responds to the gabapentin.  Will also try to reach out to [facility] to get  her back into PT to work on this.  If no improvement, she Bina need to see Dr. Ron Agee sooner (she has upcoming appt wit him Dec 19th.  Dortha Kern, MD PGY-4, Sports Medicine Fellow [place]  I observed and examined the patient with the [facility] resident and agree with assessment and plan.  Note reviewed and modified by me. Ila Mcgill, MD

## 2022-02-27 ENCOUNTER — Ambulatory Visit (INDEPENDENT_AMBULATORY_CARE_PROVIDER_SITE_OTHER): Payer: Medicare Other | Admitting: Obstetrics and Gynecology

## 2022-02-27 ENCOUNTER — Ambulatory Visit: Payer: Medicare Other | Admitting: Obstetrics and Gynecology

## 2022-02-27 ENCOUNTER — Encounter: Payer: Self-pay | Admitting: Obstetrics and Gynecology

## 2022-02-27 VITALS — BP 117/74 | HR 63

## 2022-02-27 DIAGNOSIS — N393 Stress incontinence (female) (male): Secondary | ICD-10-CM

## 2022-02-27 DIAGNOSIS — M542 Cervicalgia: Secondary | ICD-10-CM | POA: Diagnosis not present

## 2022-02-27 DIAGNOSIS — N816 Rectocele: Secondary | ICD-10-CM | POA: Diagnosis not present

## 2022-02-27 DIAGNOSIS — M5441 Lumbago with sciatica, right side: Secondary | ICD-10-CM | POA: Diagnosis not present

## 2022-02-27 DIAGNOSIS — K5900 Constipation, unspecified: Secondary | ICD-10-CM

## 2022-02-27 NOTE — Patient Instructions (Addendum)
Please use coconut oil or vitamin E cream in the vaginal tract for moisture.

## 2022-02-27 NOTE — Progress Notes (Signed)
[name]  Chief Complaint:  Chief Complaint  Patient presents with   Pessary Check   History of Present Illness: Shannon [name] is a 71 y.o. female with stage III pelvic organ prolapse who presents for a pessary check. She is using a size #1 cube pessary. The pessary she reports is possibly causing a weak stream and she is unsure if it is assisting with her rectocele. Denies any recent vaginal bleeding.   She is currently using magnesium supplements and increased water consumption for her bowels as she reports she tried Miralax, psyllium, and other bowel regimens in the past without help. Patient contributes her current constipation to taking Gabapentin for her back and states she does not plan to be on this long-term. She reports she is splinting during every bowel movement by placing her fingers in the vagina to help her defecate.   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), Anxiety, Asthma, Basal cell carcinoma of face, Benign neoplasm of skin of lower limb, Bilateral carotid bruits (04/12/2019), Diverticulosis, Gestational diabetes, Hammer toe, acquired (03/15/2009), Heart murmur (03/02/2018), History of COVID-19 (09/14/2020), Hyperglycemia (02/24/2017), Hyperlipidemia, mixed (03/11/2016), Hypothyroidism, Knee pain (09/26/2010), Lead exposure (03/02/2018), Lumbar degenerative disc disease (02/24/2017), Major depressive disorder (03/08/2013), Melanocytic nevi of trunk, Menopause, Neck pain on left side (09/10/2018), Neurofibroma (07/20/2019), Nuclear sclerotic cataract of both eyes (07/23/2012), OAB (overactive bladder) (08/05/2016), Osteopenia, Rosacea, Sciatica of left side (02/24/2017), 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   Past  Surgical History: She  has a past surgical history that includes Tonsillectomy and adenoidectomy; Dilation and curettage of uterus; blephoroplasty; Brow lift; and Skin cancer excision.   Medications: She has a current medication list which includes the following prescription(s): proair respiclick, comirnaty, dextroamphetamine, escitalopram, gabapentin, gamma-aminobutyric acid, hydrocortisone, krill oil, lorazepam, magnesium, nystatin ointment, and tirosint.   Allergies: Patient is allergic to hydrocodone-acetaminophen, gluten meal, and oxycodone.   Social History: Patient  reports that she quit smoking about 51 years ago. Her smoking use included cigarettes. She has never used smokeless tobacco. She reports current alcohol use of about 3.0 - 4.0 standard drinks of alcohol per week. She reports that she does not use drugs.      Objective:    Physical Exam: BP 117/74   Pulse 63   LMP 03/29/2004  Gen: No apparent distress, A&O x 3. Detailed Urogynecologic Evaluation:  Pelvic Exam: Normal external female genitalia; Bartholin's and Skene's glands normal in appearance; urethral meatus normal in appearance, no urethral masses or discharge. The pessary was noted to be in place. It was removed and cleaned. Speculum exam revealed no lesions in the vagina. Mild vaginal dryness noted on exam.  She was previously fitted with a #5 incontinence dish in February of this year and the pessary was ordered following that visit but never inserted. Patient has a #1 cube pessary in place before the exam, this was removed and cleaned. The #5 incontinence dish (lot [id number])  was placed following exam. Patient was able to walk around the room and urinate with pessary in place today. Following urination the pessary was rechecked for placement and noted to be in place.      POP-Q :    POP-Q  -1  Aa   -1                                           Ba  -7.5                                               C   4.5                                            Gh  4                                            Pb  8.5                                            tvl   2                                            Ap  2                                            Bp  -7.5                                              D       Assessment/Plan:    Assessment: Shannon [name] is a 71 y.o. with stage II pelvic organ prolapse and stress incontinence here for a pessary change and recheck.   Plan:  Posterior Prolapse:  - #5 incontinence dish folding pessary without support placed, she reports she is able to take out if she feels it is uncomfortable.  - #1 Cube pessary returned to patient - Patient potentially interested in a posterior repair with urethral bulking  Vaginal dryness: Discussed keeping tissues healthy with moisture in the vagina and how it is not only for intercourse purposes. Patient encouraged to try coconut oil or vitamin E cream to keep the tissues healthy and moist, especially with pessary use and friction.    Constipation: Discussed adding prunes for natural fiber to her diet as she states she does not want "more  metals in her body".    Follow up in 2 weeks for pessary re-check and/or discuss surgical options or sooner if needed. All questions were answered.   Time spent: I spent 40 minutes dedicated to the care of this patient on the date of this encounter to include pre-visit review of records, face-to-face time with the patient and post visit documentation.

## 2022-03-05 ENCOUNTER — Ambulatory Visit (INDEPENDENT_AMBULATORY_CARE_PROVIDER_SITE_OTHER): Payer: Medicare Other | Admitting: Family Medicine

## 2022-03-05 VITALS — BP 115/68 | HR 69 | Temp 98.0°F | Resp 16 | Ht 65.0 in | Wt 171.6 lb

## 2022-03-05 DIAGNOSIS — E039 Hypothyroidism, unspecified: Secondary | ICD-10-CM | POA: Diagnosis not present

## 2022-03-05 DIAGNOSIS — Z0000 Encounter for general adult medical examination without abnormal findings: Secondary | ICD-10-CM | POA: Diagnosis not present

## 2022-03-05 DIAGNOSIS — H6993 Unspecified Eustachian tube disorder, bilateral: Secondary | ICD-10-CM | POA: Diagnosis not present

## 2022-03-05 DIAGNOSIS — F900 Attention-deficit hyperactivity disorder, predominantly inattentive type: Secondary | ICD-10-CM

## 2022-03-05 DIAGNOSIS — E559 Vitamin D deficiency, unspecified: Secondary | ICD-10-CM

## 2022-03-05 DIAGNOSIS — R252 Cramp and spasm: Secondary | ICD-10-CM

## 2022-03-05 DIAGNOSIS — K5900 Constipation, unspecified: Secondary | ICD-10-CM

## 2022-03-05 DIAGNOSIS — R739 Hyperglycemia, unspecified: Secondary | ICD-10-CM

## 2022-03-05 DIAGNOSIS — R790 Abnormal level of blood mineral: Secondary | ICD-10-CM | POA: Diagnosis not present

## 2022-03-05 DIAGNOSIS — M8580 Other specified disorders of bone density and structure, unspecified site: Secondary | ICD-10-CM

## 2022-03-05 DIAGNOSIS — E782 Mixed hyperlipidemia: Secondary | ICD-10-CM | POA: Diagnosis not present

## 2022-03-05 NOTE — Progress Notes (Signed)
Subjective:   By signing my name below, I, Kellie Simmering, attest that this documentation has been prepared under the direction and in the presence of Mosie Lukes, MD., 03/05/2022.    Patient ID: Shannon [name], female DOB: 08-17-1950, 71 y.o.   MRN: [id number]  No chief complaint on file.  HPI Patient is in today for a comprehensive physical exam and follow up on chronic medical concerns.   She denies having any fever, chills, ear pain, headaches, muscle pain, joint pain, new moles, rash, itching, congestion, sinus pain, sore throat, chest pain, palpitations, wheezing, nausea, vomitting, abdominal pain, diarrhea, blood in stool, dysuria, urgency, frequency and hematuria.  ADD: She states that it is difficult for her to concentrate and is interested in undergoing an ADD evaluation.  Constipation: She complains of recurrent constipation. She drinks prune juice which provides temporary relief. She has been taking Gabapentin 300 mg to manage nerve pain and suspects this is contributing towards her constipation.  Back/Hip Pain: She complains of persistent burning and pain in her left hip and back. She is taking Gabapentin 300 mg to manage this, which provides temporary relief. She states that Ibuprofen does not relieve the pain but she does find relief when using a heating pad. She describes a sensation where her right hip feels wet when it is not. She currently sees a chiropractor to manage her back pain. She experiences numbness in her left hand, particularly in the morning, and suspects that this is due to her cell phone use.   Dental: She is up to date on dental care.   Family history: Her elder sister has recently been diagnosed with stage 1 breast cancer.   Immunizations: She is up to date on Covid-19 immunizations and has been informed about receiving a RSV immunizations.  Leg Cramps: She reports that she has leg cramps, specifically in her calves, at night. She takes magnesium  glycinate to manage this, but has been ineffective.  Rectocele Surgery: She is interested in undergoing rectocele surgery. She states that she experiences incontinence.  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 Left torn meniscus Right arthritis   Lead exposure 03/02/2018   Lumbar degenerative disc disease 02/24/2017   MRI 2019 confirms multilevel change  Sciatica at times 2/2 this and periodic radiculopathy   Major depressive disorder 03/08/2013   Melanocytic nevi of trunk    Menopause    Neck pain on left side 09/10/2018   Suspect this is DDD and DJD - XR is pending Neurapraxic spasm to left trapezius   Neurofibroma 07/20/2019   Nuclear sclerotic cataract of both eyes 07/23/2012   OAB (overactive bladder) 08/05/2016   Osteopenia    Rosacea    Sciatica of left side 02/24/2017   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   By exam she has chronic tendinopathy of Glut Med   Urinary urgency 09/17/2020   Vertigo 04/12/2019   Vitamin D deficiency 02/28/2014  Past Surgical History:  Procedure Laterality Date   blephoroplasty     BROW LIFT     DILATION AND CURETTAGE OF UTERUS     SKIN CANCER EXCISION     right lower lip   TONSILLECTOMY AND ADENOIDECTOMY     Family History  Problem Relation Age of Onset   Colon polyps Mother    Parkinson's disease Mother        developed Parkinson's disease dementia   Diabetes Father    Emphysema Father    Tuberculosis Father    Alcohol  abuse Sister    Cancer Sister        skin cancer, melanoma cell, basil cell   Cancer Sister        melanoma   Anxiety disorder Brother    Parkinson's disease Maternal Grandmother        developed Parkinson's disease dementia   Colon cancer Maternal Grandfather    ADD / ADHD Daughter    Anxiety disorder Daughter    Depression Daughter    ADD / ADHD Son    Anxiety disorder Son    Depression Son    Social History   Socioeconomic History   Marital status: Single    Spouse name: Not on file   Number of children: 2   Years of education: 18   Highest education level: Master's degree (e.g., MA, MS, MEng, MEd, MSW, MBA)  Occupational History   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   Drug use: No   Sexual activity: Not Currently  Other Topics Concern   Not on file  Social History Narrative   Retired from [employer].   No major dietary restrictions except avoid gluten.       Social Determinants of Health   Financial Resource Strain: Low Risk  (07/19/2021)   Overall Financial Resource Strain (CARDIA)    Difficulty of Paying Living Expenses: Not hard at all  Food Insecurity: No Food Insecurity (07/19/2021)   Hunger Vital Sign    Worried About Running Out of Food in the Last Year: Never true    Ran Out of Food in the Last Year: Never true  Transportation Needs: No Transportation Needs (07/19/2021)   PRAPARE - Hydrologist (Medical): No    Lack of Transportation (Non-Medical): No  Physical Activity: Insufficiently Active (07/13/2020)    Exercise Vital Sign    Days of Exercise per Week: 2 days    Minutes of Exercise per Session: 50 min  Stress: No Stress Concern Present (07/19/2021)   [place]    Feeling of Stress : Not at all  Social Connections: Moderately Integrated (07/19/2021)   Social Connection and Isolation Panel [NHANES]    Frequency of Communication with Friends and Family: More than three times a week    Frequency of Social Gatherings with Friends and Family: More than three times a week    Attends  Religious Services: More than 4 times per year    Active Member of Clubs or Organizations: Yes    Attends Archivist Meetings: More than 4 times per year    Marital Status: Divorced  Intimate Partner Violence: Not At Risk (07/19/2021)   Humiliation, Afraid, Rape, and Kick questionnaire    Fear of Current or Ex-Partner: No    Emotionally Abused: No    Physically Abused: No    Sexually Abused: No   Outpatient Medications Prior to Visit  Medication Sig Dispense Refill   Albuterol Sulfate (PROAIR RESPICLICK) 614 (90 Base) MCG/ACT AEPB Inhale 2 puffs every 4-6 hours as needed for cough and shortness of breath. 1 each 0   COVID-19 mRNA vaccine 2023-2024 (COMIRNATY) syringe Inject into the muscle. 0.3 mL 0   dextroamphetamine (DEXTROSTAT) 10 MG tablet Take 1 tablet (10 mg total) by mouth daily. 30 tablet 0   escitalopram (LEXAPRO) 10 MG tablet Take 1 tablet (10 mg total) by mouth daily. 90 tablet 1   gabapentin (NEURONTIN) 300 MG capsule Take 1 capsule (300 mg total) by mouth 3 (three) times daily. 90 capsule 2   GAMMA AMINOBUTYRIC ACID PO Take 200 mg by mouth as needed. Up to bid     hydrocortisone (PROCTOZONE-HC) 2.5 % rectal cream Place 1 application rectally 2 (two) times daily. 30 g 0   Krill Oil 1000 MG CAPS      LORazepam (ATIVAN) 2 MG tablet Take 1 tablet (2 mg total) by mouth every 8 (eight) hours as needed for anxiety. 40 tablet 0    MAGNESIUM GLYCINATE PLUS PO Take 100 mg by mouth 2 (two) times daily.     nystatin ointment (MYCOSTATIN)      TIROSINT 88 MCG CAPS Take 1 capsule (88 mcg total) by mouth daily before breakfast. 90 capsule 1   No facility-administered medications prior to visit.   Allergies  Allergen Reactions   Hydrocodone-Acetaminophen Nausea And Vomiting   Gluten Meal Other (See Comments)    GI symptoms; has BM everytime she urinates GI symptoms; has BM everytime she urinates   Oxycodone Nausea And Vomiting   Review of Systems  Constitutional:  Negative for chills and fever.  HENT:  Negative for congestion, ear pain, sinus pain and sore throat.   Respiratory:  Negative for cough, shortness of breath and wheezing.   Cardiovascular:  Negative for chest pain and palpitations.  Gastrointestinal:  Positive for constipation. Negative for abdominal pain, blood in stool, diarrhea, nausea and vomiting.  Genitourinary:  Negative for dysuria, frequency, hematuria and urgency.       (+) incontinence.  Musculoskeletal:  Negative for joint pain and myalgias.  Skin:  Negative for itching and rash.       (-) New moles.  Neurological:  Negative for headaches.      Objective:    Physical Exam Constitutional:      General: She is not in acute distress.    Appearance: Normal appearance. She is not ill-appearing.  HENT:     Head: Normocephalic and atraumatic.     Right Ear: Tympanic membrane, ear canal and external ear normal.     Left Ear: Tympanic membrane, ear canal and external ear normal.     Mouth/Throat:     Mouth: Mucous membranes are moist.     Pharynx: Oropharynx is clear.  Eyes:     Extraocular Movements: Extraocular movements intact.     Right eye: No nystagmus.     Left eye: No  nystagmus.     Pupils: Pupils are equal, round, and reactive to light.  Neck:     Vascular: No carotid bruit.  Cardiovascular:     Rate and Rhythm: Normal rate and regular rhythm.     Pulses: Normal pulses.      Heart sounds: Normal heart sounds. No murmur heard.    No gallop.  Pulmonary:     Effort: Pulmonary effort is normal. No respiratory distress.     Breath sounds: Normal breath sounds. No wheezing or rales.  Abdominal:     General: Bowel sounds are normal.     Tenderness: There is no abdominal tenderness.  Musculoskeletal:     Comments: Muscle strength 5/5 on upper and lower extremities.   Lymphadenopathy:     Cervical: No cervical adenopathy.  Skin:    General: Skin is warm and dry.  Neurological:     Mental Status: She is alert and oriented to person, place, and time.     Sensory: Sensation is intact.     Motor: Motor function is intact.     Coordination: Coordination is intact.     Deep Tendon Reflexes:     Reflex Scores:      Patellar reflexes are 2+ on the right side and 2+ on the left side. Psychiatric:        Mood and Affect: Mood normal.        Behavior: Behavior normal.        Judgment: Judgment normal.    LMP 03/29/2004  Wt Readings from Last 3 Encounters:  02/21/22 165 lb (74.8 kg)  02/14/22 170 lb (77.1 kg)  08/23/21 167 lb 12.8 oz (76.1 kg)   Diabetic Foot Exam - Simple   No data filed    Lab Results  Component Value Date   WBC 4.8 08/23/2021   HGB 13.3 08/23/2021   HCT 38.9 08/23/2021   PLT 235.0 08/23/2021   GLUCOSE 84 08/23/2021   CHOL 194 08/23/2021   TRIG 155.0 (H) 08/23/2021   HDL 57.10 08/23/2021   LDLCALC 106 (H) 08/23/2021   ALT 18 08/23/2021   AST 26 08/23/2021   NA 140 08/23/2021   K 5.0 08/23/2021   CL 103 08/23/2021   CREATININE 1.17 08/23/2021   BUN 16 08/23/2021   CO2 30 08/23/2021   TSH 0.46 08/15/2021   HGBA1C 5.4 08/23/2021   MICROALBUR 0.2 02/28/2014   Lab Results  Component Value Date   TSH 0.46 08/15/2021   Lab Results  Component Value Date   WBC 4.8 08/23/2021   HGB 13.3 08/23/2021   HCT 38.9 08/23/2021   MCV 97.8 08/23/2021   PLT 235.0 08/23/2021   Lab Results  Component Value Date   NA 140 08/23/2021   K  5.0 08/23/2021   CO2 30 08/23/2021   GLUCOSE 84 08/23/2021   BUN 16 08/23/2021   CREATININE 1.17 08/23/2021   BILITOT 1.0 08/23/2021   ALKPHOS 66 08/23/2021   AST 26 08/23/2021   ALT 18 08/23/2021   PROT 6.5 08/23/2021   ALBUMIN 4.4 08/23/2021   CALCIUM 9.4 08/23/2021   GFR 47.13 (L) 08/23/2021   Lab Results  Component Value Date   CHOL 194 08/23/2021   Lab Results  Component Value Date   HDL 57.10 08/23/2021   Lab Results  Component Value Date   LDLCALC 106 (H) 08/23/2021   Lab Results  Component Value Date   TRIG 155.0 (H) 08/23/2021   Lab Results  Component Value Date  CHOLHDL 3 08/23/2021   Lab Results  Component Value Date   HGBA1C 5.4 08/23/2021   Colonoscopy: Last completed on 03/07/2016. - Mild sigmoid colon diverticulosis - The colon was otherwise normal on direct and retroflexion views. - No specimens collected. Repeat in 10 years.  Dexa: Last completed on 07/26/2021. Patient has OSTEOPENIA. Repeat in 2 years.   Mammogram: Last completed on 06/19/2021. No mammographic evidence of malignancy. Repeat in 1 year.   Pap Smear: Last completed on 04/08/2019. Normal results. Repeat in 3-5 years.      Assessment & Plan:   Problem List Items Addressed This Visit   None  No orders of the defined types were placed in this encounter.  I, Kellie Simmering, personally preformed the services described in this documentation.  All medical record entries made by the scribe were at my direction and in my presence.  I have reviewed the chart and discharge instructions (if applicable) and agree that the record reflects my personal performance and is accurate and complete. 03/05/2022  I,Mohammed Iqbal,acting as a scribe for Penni Homans, MD.,have documented all relevant documentation on the behalf of Penni Homans, MD,as directed by  Penni Homans, MD while in the presence of Penni Homans, MD.  Kellie Simmering

## 2022-03-05 NOTE — Assessment & Plan Note (Signed)
On Levothyroxine, continue to monitor 

## 2022-03-05 NOTE — Assessment & Plan Note (Signed)
Encourage heart healthy diet such as MIND or DASH diet, increase exercise, avoid trans fats, simple carbohydrates and processed foods, consider a krill or fish or flaxseed oil cap daily.  °

## 2022-03-05 NOTE — Assessment & Plan Note (Signed)
hgba1c acceptable, minimize simple carbs. Increase exercise as tolerated.  

## 2022-03-05 NOTE — Patient Instructions (Addendum)
If hand numbness returns try ice and lidocaine and splinting  CBD Daily extra strength with menthol order on line.   Encouraged increased hydration and fiber in diet. Daily probiotics. If bowels not moving can use MOM 2 tbls po in 4 oz of warm prune juice by mouth every 2-3 days. If no results then repeat in 4 hours with  Dulcolax suppository pr, Shannon [name] repeat again in 4 more hours as needed. Seek care if symptoms worsen. Consider daily Miralax and/or Dulcolax if symptoms persist.     Preventive Care 65 Years and Older, Female Preventive care refers to lifestyle choices and visits with your health care provider that can promote health and wellness. Preventive care visits are also called wellness exams. What can I expect for my preventive care visit? Counseling Your health care provider Shannon [name] ask you questions about your: Medical history, including: Past medical problems. Family medical history. Pregnancy and menstrual history. History of falls. Current health, including: Memory and ability to understand (cognition). Emotional well-being. Home life and relationship well-being. Sexual activity and sexual health. Lifestyle, including: Alcohol, nicotine or tobacco, and drug use. Access to firearms. Diet, exercise, and sleep habits. Work and work Statistician. Sunscreen use. Safety issues such as seatbelt and bike helmet use. Physical exam Your health care provider will check your: Height and weight. These Shannon [name] be used to calculate your BMI (body mass index). BMI is a measurement that tells if you are at a healthy weight. Waist circumference. This measures the distance around your waistline. This measurement also tells if you are at a healthy weight and Shannon [name] help predict your risk of certain diseases, such as type 2 diabetes and high blood pressure. Heart rate and blood pressure. Body temperature. Skin for abnormal spots. What immunizations do I need?  Vaccines are usually given at various  ages, according to a schedule. Your health care provider will recommend vaccines for you based on your age, medical history, and lifestyle or other factors, such as travel or where you work. What tests do I need? Screening Your health care provider Shannon [name] recommend screening tests for certain conditions. This Shannon [name] include: Lipid and cholesterol levels. Hepatitis C test. Hepatitis B test. HIV (human immunodeficiency virus) test. STI (sexually transmitted infection) testing, if you are at risk. Lung cancer screening. Colorectal cancer screening. Diabetes screening. This is done by checking your blood sugar (glucose) after you have not eaten for a while (fasting). Mammogram. Talk with your health care provider about how often you should have regular mammograms. BRCA-related cancer screening. This Shannon [name] be done if you have a family history of breast, ovarian, tubal, or peritoneal cancers. Bone density scan. This is done to screen for osteoporosis. Talk with your health care provider about your test results, treatment options, and if necessary, the need for more tests. Follow these instructions at home: Eating and drinking  Eat a diet that includes fresh fruits and vegetables, whole grains, lean protein, and low-fat dairy products. Limit your intake of foods with high amounts of sugar, saturated fats, and salt. Take vitamin and mineral supplements as recommended by your health care provider. Do not drink alcohol if your health care provider tells you not to drink. If you drink alcohol: Limit how much you have to 0-1 drink a day. Know how much alcohol is in your drink. In the [place] one drink equals one 12 oz bottle of beer (355 mL), one 5 oz glass of wine (148 mL), or one 1 oz glass of hard liquor (44  mL). Lifestyle Brush your teeth every morning and night with fluoride toothpaste. Floss one time each day. Exercise for at least 30 minutes 5 or more days each week. Do not use any products that  contain nicotine or tobacco. These products include cigarettes, chewing tobacco, and vaping devices, such as e-cigarettes. If you need help quitting, ask your health care provider. Do not use drugs. If you are sexually active, practice safe sex. Use a condom or other form of protection in order to prevent STIs. Take aspirin only as told by your health care provider. Make sure that you understand how much to take and what form to take. Work with your health care provider to find out whether it is safe and beneficial for you to take aspirin daily. Ask your health care provider if you need to take a cholesterol-lowering medicine (statin). Find healthy ways to manage stress, such as: Meditation, yoga, or listening to music. Journaling. Talking to a trusted person. Spending time with friends and family. Minimize exposure to UV radiation to reduce your risk of skin cancer. Safety Always wear your seat belt while driving or riding in a vehicle. Do not drive: If you have been drinking alcohol. Do not ride with someone who has been drinking. When you are tired or distracted. While texting. If you have been using any mind-altering substances or drugs. Wear a helmet and other protective equipment during sports activities. If you have firearms in your house, make sure you follow all gun safety procedures. What's next? Visit your health care provider once a year for an annual wellness visit. Ask your health care provider how often you should have your eyes and teeth checked. Stay up to date on all vaccines. This information is not intended to replace advice given to you by your health care provider. Make sure you discuss any questions you have with your health care provider. Document Revised: 10/11/2020 Document Reviewed: 10/11/2020 Elsevier Patient Education  [place].

## 2022-03-05 NOTE — Assessment & Plan Note (Signed)
Supplement and monitor 

## 2022-03-05 NOTE — Assessment & Plan Note (Signed)
Patient encouraged to maintain heart healthy diet, regular exercise, adequate sleep. Consider daily probiotics. Take medications as prescribed Labs ordered and reviewed  Aged out of colonoscopy and MGM and pap Dexa 06/2021 repeat in 3-5 years.

## 2022-03-05 NOTE — Assessment & Plan Note (Signed)
Add Flonase, nasal saline daily and consider Cetirizine daily as well.

## 2022-03-06 LAB — LIPID PANEL
Cholesterol: 196 mg/dL (ref 0–200)
HDL: 64.5 mg/dL (ref >39.00)
LDL Cholesterol: 108 mg/dL — ABNORMAL HIGH (ref 0–99)
NonHDL: 131.48
Total CHOL/HDL Ratio: 3
Triglycerides: 118 mg/dL (ref 0.0–149.0)
VLDL: 23.6 mg/dL (ref 0.0–40.0)

## 2022-03-06 LAB — COMPREHENSIVE METABOLIC PANEL
ALT: 17 U/L (ref 0–35)
AST: 23 U/L (ref 0–37)
Albumin: 4.3 g/dL (ref 3.5–5.2)
Alkaline Phosphatase: 71 U/L (ref 39–117)
BUN: 20 mg/dL (ref 6–23)
CO2: 30 mEq/L (ref 19–32)
Calcium: 9.4 mg/dL (ref 8.4–10.5)
Chloride: 103 mEq/L (ref 96–112)
Creatinine, Ser: 0.87 mg/dL (ref 0.40–1.20)
GFR: 67.01 mL/min (ref >60.00)
Glucose, Bld: 88 mg/dL (ref 70–99)
Potassium: 4.7 mEq/L (ref 3.5–5.1)
Sodium: 139 mEq/L (ref 135–145)
Total Bilirubin: 0.6 mg/dL (ref 0.2–1.2)
Total Protein: 6.6 g/dL (ref 6.0–8.3)

## 2022-03-06 LAB — VITAMIN D 25 HYDROXY (VIT D DEFICIENCY, FRACTURES): VITD: 77.79 ng/mL (ref 30.00–100.00)

## 2022-03-06 LAB — HEMOGLOBIN A1C: Hgb A1c MFr Bld: 5.5 % (ref 4.6–6.5)

## 2022-03-06 LAB — MAGNESIUM: Magnesium: 2.2 mg/dL (ref 1.5–2.5)

## 2022-03-06 LAB — CBC
HCT: 39.9 % (ref 36.0–46.0)
Hemoglobin: 13.3 g/dL (ref 12.0–15.0)
MCHC: 33.4 g/dL (ref 30.0–36.0)
MCV: 100.2 fl — ABNORMAL HIGH (ref 78.0–100.0)
Platelets: 249 10*3/uL (ref 150.0–400.0)
RBC: 3.99 Mil/uL (ref 3.87–5.11)
RDW: 11.8 % (ref 11.5–15.5)
WBC: 6.5 10*3/uL (ref 4.0–10.5)

## 2022-03-06 NOTE — Assessment & Plan Note (Signed)

## 2022-03-06 NOTE — Assessment & Plan Note (Signed)
Encouraged increased hydration and fiber in diet. Daily probiotics. If bowels not moving can use MOM 2 tbls po in 4 oz of warm prune juice by mouth every 2-3 days. If no results then repeat in 4 hours with  Dulcolax suppository pr, Jolean repeat again in 4 more hours as needed. Seek care if symptoms worsen. Consider daily Miralax and/or Dulcolax if symptoms persist.  

## 2022-03-06 NOTE — Assessment & Plan Note (Signed)
Is considering getting reevaluated. She is not sure her current med is helping as it should any longer

## 2022-03-07 ENCOUNTER — Ambulatory Visit (INDEPENDENT_AMBULATORY_CARE_PROVIDER_SITE_OTHER): Payer: Medicare Other | Admitting: Sports Medicine

## 2022-03-07 VITALS — BP 124/86 | Ht 65.0 in | Wt 165.0 lb

## 2022-03-07 DIAGNOSIS — M5136 Other intervertebral disc degeneration, lumbar region: Secondary | ICD-10-CM | POA: Diagnosis not present

## 2022-03-07 LAB — ZINC: Zinc: 70 ug/dL (ref 60–130)

## 2022-03-07 NOTE — Progress Notes (Signed)
  Shannon [name] - 71 y.o. female MRN [id number]  Date of birth: 16-Jun-1950    CHIEF COMPLAINT:   Low back pain    SUBJECTIVE:   HPI:  Pleasant 71 year old female comes to clinic to follow-up low back pain.  She was last seen here on 10/26.  She was having low back pain due to lumbar degenerative disc disease with some L1 radiculopathy that was exacerbated by long car ride from [place] back to [place].  She was placed on gabapentin 300 mg 3 times daily.  Since then she says she has been feeling much better.  She has been going to physical therapy, her chiropractor, and had dry needling done.  She is now feeling much better.  The nerve pain is essentially gone now.  She is overall very pleased with her progress.  ROS:     See HPI  PERTINENT  PMH / PSH FH / / SH:  Past Medical, Surgical, Social, and Family History Reviewed & Updated in the EMR.  Pertinent findings include:  Scoliosis  OBJECTIVE: LMP 03/29/2004   Physical Exam:  Vital signs are reviewed.  GEN: Alert and oriented, NAD Pulm: Breathing unlabored PSY: normal mood, congruent affect  MSK: Lumbar spine -nontender to palpation over lumbar spinous processes, no bony step-off.  Nontender to palpation over the lumbar paraspinal musculature.  No palpable spasm.  Full range of motion flexion, extension, side bending and twisting.  All of these motions are nonpainful.  She has negative straight leg raise bilaterally.  5/5 strength in hip flexion bilaterally.  Neurovascular intact distally  Gait analysis - normal gait pattern with no hip drop  ASSESSMENT & PLAN:  1.  Low back pain -Much improved with multimodal conservative treatments including physical therapy with Ulyess Blossom, chiropractor, and dry needling.  Continue gabapentin 300 mg 3 times daily.  She will follow-up here as needed.  Dortha Kern, MD PGY-4, Sports Medicine Fellow [name]  I observed and examined the patient with the resident  and agree with assessment and plan.  Note reviewed and modified by me.  She is planning a  [place] trip in ~ 2 weeks.  I suggested starting multiple short walks before that.  I think she should continue the GB through the trip and if she is better We can consider weaning to just 300 at [facility] when she returns. Ila Mcgill, MD

## 2022-03-08 DIAGNOSIS — M542 Cervicalgia: Secondary | ICD-10-CM | POA: Diagnosis not present

## 2022-03-08 DIAGNOSIS — M5441 Lumbago with sciatica, right side: Secondary | ICD-10-CM | POA: Diagnosis not present

## 2022-03-11 ENCOUNTER — Encounter: Payer: Self-pay | Admitting: Obstetrics and Gynecology

## 2022-03-11 ENCOUNTER — Ambulatory Visit (INDEPENDENT_AMBULATORY_CARE_PROVIDER_SITE_OTHER): Payer: Medicare Other | Admitting: Obstetrics and Gynecology

## 2022-03-11 VITALS — BP 123/80 | HR 60

## 2022-03-11 DIAGNOSIS — N816 Rectocele: Secondary | ICD-10-CM

## 2022-03-11 DIAGNOSIS — N393 Stress incontinence (female) (male): Secondary | ICD-10-CM

## 2022-03-11 NOTE — Patient Instructions (Signed)

## 2022-03-11 NOTE — Progress Notes (Signed)
Urogynecology Return Visit  SUBJECTIVE  History of Present Illness: Sierria H Garbers is a 71 y.o. female seen in follow-up for prolapse. She has not been using a pessary.   Feels that she has been drinking more water and that helps with constipation.   Has not had any bladder urgency. Still has leakage occasionally but not really sure when it happens, only uses a light pad. Does have leakage with lifting.   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), Anxiety, Asthma, Basal cell carcinoma of face, Benign neoplasm of skin of lower limb, Bilateral carotid bruits (04/12/2019), Diverticulosis, Gestational diabetes, Hammer toe, acquired (03/15/2009), Heart murmur (03/02/2018), History of COVID-19 (09/14/2020), Hyperglycemia (02/24/2017), Hyperlipidemia, mixed (03/11/2016), Hypothyroidism, Knee pain (09/26/2010), Lead exposure (03/02/2018), Lumbar degenerative disc disease (02/24/2017), Major depressive disorder (03/08/2013), Melanocytic nevi of trunk, Menopause, Neck pain on left side (09/10/2018), Neurofibroma (07/20/2019), Nuclear sclerotic cataract of both eyes (07/23/2012), OAB (overactive bladder) (08/05/2016), Osteopenia, Rosacea, Sciatica of left side (02/24/2017), 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   Past Surgical History: She  has a past surgical history that includes Tonsillectomy and adenoidectomy; Dilation and curettage of uterus; blephoroplasty; Brow lift; and Skin cancer excision.   Medications: She has a current medication list which includes the following prescription(s): proair respiclick, comirnaty, dextroamphetamine, escitalopram, gabapentin, gamma-aminobutyric acid, hydrocortisone, krill oil, lorazepam, magnesium, nystatin ointment, and tirosint.   Allergies: Patient is  allergic to hydrocodone-acetaminophen, gluten meal, and oxycodone.   Social History: Patient  reports that she quit smoking about 51 years ago. Her smoking use included cigarettes. She has never used smokeless tobacco. She reports current alcohol use of about 3.0 - 4.0 standard drinks of alcohol per week. She reports that she does not use drugs.      OBJECTIVE     Physical Exam: Vitals:   03/11/22 0807  BP: 123/80  Pulse: 60   Gen: No apparent distress, A&O x 3.  Detailed Urogynecologic Evaluation:  Deferred. Prior exam showed:  POP-Q (02/27/22) :    POP-Q   -1                                            Aa   -1                                           Ba   -7.5                                              C    4.5                                            Gh   4  Pb   8.5                                            tvl    2                                            Ap   2                                            Bp   -7.5                                          ASSESSMENT AND PLAN    Ms. Luster is a 71 y.o. with:  1. Prolapse of posterior vaginal wall   2. SUI (stress urinary incontinence, female)    - Will need urodynamic testing to confirm stress incontinence.   Plan for surgery: Exam under anesthesia, posterior repair, perineorrhaphy, urethral bulking, cystoscopy, possible anterior repair  - We reviewed the patient's specific anatomic and functional findings, with the assistance of diagrams, and together finalized the above procedure. The planned surgical procedures were discussed along with the surgical risks outlined below, which were also provided on a detailed handout. Additional treatment options including expectant management, conservative management, medical management were discussed where appropriate.  We reviewed the benefits and risks of each treatment option.  - For preop Visit:  She is required to  have a visit within 30 days of her surgery.    - Medical clearance: not required  - Anticoagulant use: No - Medicaid Hysterectomy form: No - Accepts blood transfusion: Yes - Expected length of stay: outpatient  Request sent for surgery scheduling.   Jaquita Folds, MD

## 2022-03-12 ENCOUNTER — Encounter: Payer: Self-pay | Admitting: Family Medicine

## 2022-03-13 DIAGNOSIS — D2272 Melanocytic nevi of left lower limb, including hip: Secondary | ICD-10-CM | POA: Diagnosis not present

## 2022-03-13 DIAGNOSIS — D2261 Melanocytic nevi of right upper limb, including shoulder: Secondary | ICD-10-CM | POA: Diagnosis not present

## 2022-03-13 DIAGNOSIS — Z86008 Personal history of in-situ neoplasm of other site: Secondary | ICD-10-CM | POA: Diagnosis not present

## 2022-03-13 DIAGNOSIS — D2271 Melanocytic nevi of right lower limb, including hip: Secondary | ICD-10-CM | POA: Diagnosis not present

## 2022-03-13 DIAGNOSIS — D2371 Other benign neoplasm of skin of right lower limb, including hip: Secondary | ICD-10-CM | POA: Diagnosis not present

## 2022-03-13 DIAGNOSIS — D2361 Other benign neoplasm of skin of right upper limb, including shoulder: Secondary | ICD-10-CM | POA: Diagnosis not present

## 2022-03-13 DIAGNOSIS — L821 Other seborrheic keratosis: Secondary | ICD-10-CM | POA: Diagnosis not present

## 2022-03-13 DIAGNOSIS — L578 Other skin changes due to chronic exposure to nonionizing radiation: Secondary | ICD-10-CM | POA: Diagnosis not present

## 2022-03-13 DIAGNOSIS — L570 Actinic keratosis: Secondary | ICD-10-CM | POA: Diagnosis not present

## 2022-03-13 DIAGNOSIS — Z86018 Personal history of other benign neoplasm: Secondary | ICD-10-CM | POA: Diagnosis not present

## 2022-03-13 DIAGNOSIS — D225 Melanocytic nevi of trunk: Secondary | ICD-10-CM | POA: Diagnosis not present

## 2022-03-15 ENCOUNTER — Other Ambulatory Visit: Payer: Self-pay | Admitting: Internal Medicine

## 2022-03-16 ENCOUNTER — Other Ambulatory Visit: Payer: Self-pay | Admitting: Family Medicine

## 2022-03-18 MED ORDER — DEXTROAMPHETAMINE SULFATE 10 MG PO TABS: 10.0000 mg | ORAL_TABLET | Freq: Every day | ORAL | 0 refills | Status: DC

## 2022-03-18 NOTE — Telephone Encounter (Signed)
Requesting: [place]  Contract: 03/02/2018 UDS: 08/23/2021  Last Visit: 03/13/2022 Next Visit: 09/02/2022 Last Refill: 02/14/2022  Please Advise

## 2022-03-20 DIAGNOSIS — M5441 Lumbago with sciatica, right side: Secondary | ICD-10-CM | POA: Diagnosis not present

## 2022-03-20 DIAGNOSIS — M542 Cervicalgia: Secondary | ICD-10-CM | POA: Diagnosis not present

## 2022-03-25 NOTE — Progress Notes (Unsigned)
[facility] Urodynamics Procedure  Referring Physician: Mosie Lukes, MD Date of Procedure: 03/27/2022  Shannon [name] is a 71 y.o. female who presents for urodynamic evaluation. Indication(s) for study: {UDS indications:24784}  Vital Signs: LMP 03/29/2004   Laboratory Results: A {clean catch/ RJJO:84166} urine specimen revealed:  POC urine:    Voiding Diary: The patient voided {NUMBERS; 1-20:18551} times per day. Voided volumes ranged from *** mL to *** mL, with an average of *** mL.  Intervals between voids ranged from *** hr to ***hr with an average interval between voids of *** hr.  Intake per day ranged from ***mL to ***mL.  Output ranged from ***mL to ***mL.  She had *** incontinence episodes per day (*** UUI, *** SUI) and *** episodes of nocturia.  She drinks: *** oz of caffeinated beverages, *** oz of juice, and *** oz of alcohol per day.  Procedure Timeout:  The correct patient was verified and the correct procedure was verified. The patient was in the correct position and safety precautions were reviewed based on at the patient's history.  Urodynamic Procedure A 25F dual lumen urodynamics catheter was placed under sterile conditions into the patient's bladder. A 25F catheter was placed into the {vagina/ rectum:24786} in order to measure abdominal pressure. EMG patches were placed in the appropriate position.  All connections were confirmed and calibrations/adjusted made. Saline was instilled into the bladder through the dual lumen catheters.  Cough/valsalva pressures were measured periodically during filling.  Patient was allowed to void.  The bladder was then emptied of its residual.  UROFLOW: Revealed a Qmax of *** mL/sec.  She voided *** mL and had a residual of *** mL.  It was a {uroflow:24789} pattern and represented normal habits ***though interpretation limited due to low voided volume.***  CMG: This was performed with sterile water in the sitting position at  a fill rate of 30*** mL/min.    First sensation of fullness was *** mLs,  First urge was *** mLs,  Strong urge was *** mLs and  Capacity was *** mLs  Stress incontinence {WAS/WAS NOT:[phone]::"was not"} demonstrated {highest/ lowest:24787} {Desc; negative/positive:13464} CLPP was *** cmH20 at *** ml. {highest/ lowest:24787} {Desc; negative/positive:13464} VLPP was *** cmH20at *** ml. {highest/ lowest:24787} {Desc; negative/positive:13464} Barrier CLPP was *** cmH20 at *** ml. {highest/ lowest:24787} {Desc; negative/positive:13464} Barrier VLPP was *** cmH20 at *** ml.  Detrusor function was {Desc; normal/underactive/overactive:13463}, {With/no:32556} phasic contractions seen.  The first occurred at *** mL to *** cm of water and {WAS/WAS NOT:[phone]::"was not"} associated with {urge/ AYTK:16010}.  Compliance:  ***. End fill detrusor pressure was ***cmH20.  Calculated compliance was ***XN/ATF57  UPP: MUCP {With/without:5700} barrier reduction was *** cm of water.    MICTURITION STUDY: Voiding was performed {reduction:24791} in the sitting position.  Pdet at Qmax was *** cm of water.  Qmax was *** mL/sec.  It was a {uroflow:24789} pattern.  She voided *** mL and had a residual of *** mL.  It was a volitional void, sustained detrusor contraction was {DESC; PRESENT/NOT PRESENT:21021351} and abdominal straining was {DESC; PRESENT/NOT PRESENT:21021351}  EMG: This was performed with patches.  She had voluntary contractions, recruitment with fill was {DESC; PRESENT/NOT PRESENT:21021351} and urethral sphincter was {relaxed:24792} with void.  The details of the procedure with the study tracings have been scanned into EPIC.   Urodynamic Impression:  1. Sensation was {DESC; NORMAL/REDUCED/ABSENT/INCREASED:23133}; capacity was {DESC; NORMAL/REDUCED/ABSENT/INCREASED:23133} 2. Stress Incontinence {WAS/WAS NOT:[phone]::"was not"} demonstrated at {ISD:24793} pressures; 3. Detrusor Overactivity  {WAS/WAS NOT:[phone]::"was not"}  demonstrated {With-without:32421} leakage. 4. Emptying was {dysfunctional:24794} with a {elevated:24795} PVR, a sustained detrusor contraction {DESC; PRESENT/NOT PRESENT:21021351},  abdominal straining {DESC; PRESENT/NOT PRESENT:21021351}, {dyssynergic:24796} urethral sphincter activity on EMG.  Plan: - The patient will follow up  to discuss the findings and treatment options.

## 2022-03-27 ENCOUNTER — Ambulatory Visit (INDEPENDENT_AMBULATORY_CARE_PROVIDER_SITE_OTHER): Payer: Medicare Other | Admitting: Obstetrics and Gynecology

## 2022-03-27 ENCOUNTER — Encounter: Payer: Self-pay | Admitting: Obstetrics and Gynecology

## 2022-03-27 DIAGNOSIS — N3281 Overactive bladder: Secondary | ICD-10-CM | POA: Diagnosis not present

## 2022-03-27 DIAGNOSIS — N393 Stress incontinence (female) (male): Secondary | ICD-10-CM | POA: Diagnosis not present

## 2022-03-27 DIAGNOSIS — R350 Frequency of micturition: Secondary | ICD-10-CM | POA: Diagnosis not present

## 2022-03-27 DIAGNOSIS — N3941 Urge incontinence: Secondary | ICD-10-CM | POA: Diagnosis not present

## 2022-03-27 LAB — POCT URINALYSIS DIPSTICK
Bilirubin, UA: NEGATIVE
Blood, UA: NEGATIVE
Glucose, UA: NEGATIVE
Ketones, UA: NEGATIVE
Nitrite, UA: NEGATIVE
Protein, UA: POSITIVE — AB
Spec Grav, UA: 1.025 (ref 1.010–1.025)
Urobilinogen, UA: 0.2 E.U./dL
pH, UA: 7 (ref 5.0–8.0)

## 2022-03-27 NOTE — Patient Instructions (Signed)
Taking Care of Yourself after Urodynamics, Cystoscopy, Bulkamid Injection, or Botox Injection   Drink plenty of water for a day or two following your procedure. Try to have about 8 ounces (one cup) at a time, and do this 6 times or more per day unless you have fluid restrictitons AVOID irritative beverages such as coffee, tea, soda, alcoholic or citrus drinks for a day or two, as this Eiman cause burning with urination. You Klohe experience some discomfort or a burning sensation with urination after having this procedure. You can use over the counter Azo or pyridium to help with burning and follow the instructions on the packaging. If it does not improve within 1-2 days, or other symptoms appear (fever, chills, or difficulty urinating) call the office to speak to a nurse.  You Anique return to normal daily activities such as work, school, driving, exercising and housework on the day of the procedure. If your doctor gave you a prescription, take it as ordered.

## 2022-04-11 ENCOUNTER — Other Ambulatory Visit: Payer: Medicare Other

## 2022-04-12 ENCOUNTER — Other Ambulatory Visit (INDEPENDENT_AMBULATORY_CARE_PROVIDER_SITE_OTHER): Payer: Medicare Other

## 2022-04-12 DIAGNOSIS — E559 Vitamin D deficiency, unspecified: Secondary | ICD-10-CM | POA: Diagnosis not present

## 2022-04-12 DIAGNOSIS — E063 Autoimmune thyroiditis: Secondary | ICD-10-CM

## 2022-04-12 LAB — TSH: TSH: 2.38 u[IU]/mL (ref 0.35–5.50)

## 2022-04-12 LAB — VITAMIN D 25 HYDROXY (VIT D DEFICIENCY, FRACTURES): VITD: 85.15 ng/mL (ref 30.00–100.00)

## 2022-04-14 ENCOUNTER — Other Ambulatory Visit: Payer: Self-pay | Admitting: Family Medicine

## 2022-04-15 MED ORDER — DEXTROAMPHETAMINE SULFATE 10 MG PO TABS: 10.0000 mg | ORAL_TABLET | Freq: Every day | ORAL | 0 refills | Status: DC

## 2022-04-15 NOTE — Telephone Encounter (Signed)
Requesting: dextroamphetamine '10mg'$  Contract:  UDS: 08/23/21 Last Visit: 03/05/22 Next Visit: 09/01/21 Last Refill: 03/18/22  Please Advise

## 2022-04-16 ENCOUNTER — Ambulatory Visit: Payer: Medicare Other | Admitting: Sports Medicine

## 2022-04-16 DIAGNOSIS — M5416 Radiculopathy, lumbar region: Secondary | ICD-10-CM | POA: Diagnosis not present

## 2022-04-16 DIAGNOSIS — M4120 Other idiopathic scoliosis, site unspecified: Secondary | ICD-10-CM | POA: Diagnosis not present

## 2022-04-17 DIAGNOSIS — M5441 Lumbago with sciatica, right side: Secondary | ICD-10-CM | POA: Diagnosis not present

## 2022-04-17 DIAGNOSIS — M542 Cervicalgia: Secondary | ICD-10-CM | POA: Diagnosis not present

## 2022-04-25 ENCOUNTER — Encounter: Payer: Self-pay | Admitting: Family Medicine

## 2022-04-25 MED ORDER — HYDROCORTISONE (PERIANAL) 2.5 % EX CREA: 1.0000 | TOPICAL_CREAM | Freq: Two times a day (BID) | CUTANEOUS | 0 refills | Status: DC

## 2022-04-26 IMAGING — DX DG CHEST 2V
2 series · 2 of 2 positions shown · non-contrast
Comparison: Two-view chest x-ray 09/02/2010

CLINICAL DATA: Cough since [REDACTED].

EXAM:
CHEST - 2 VIEW

[chest pa]
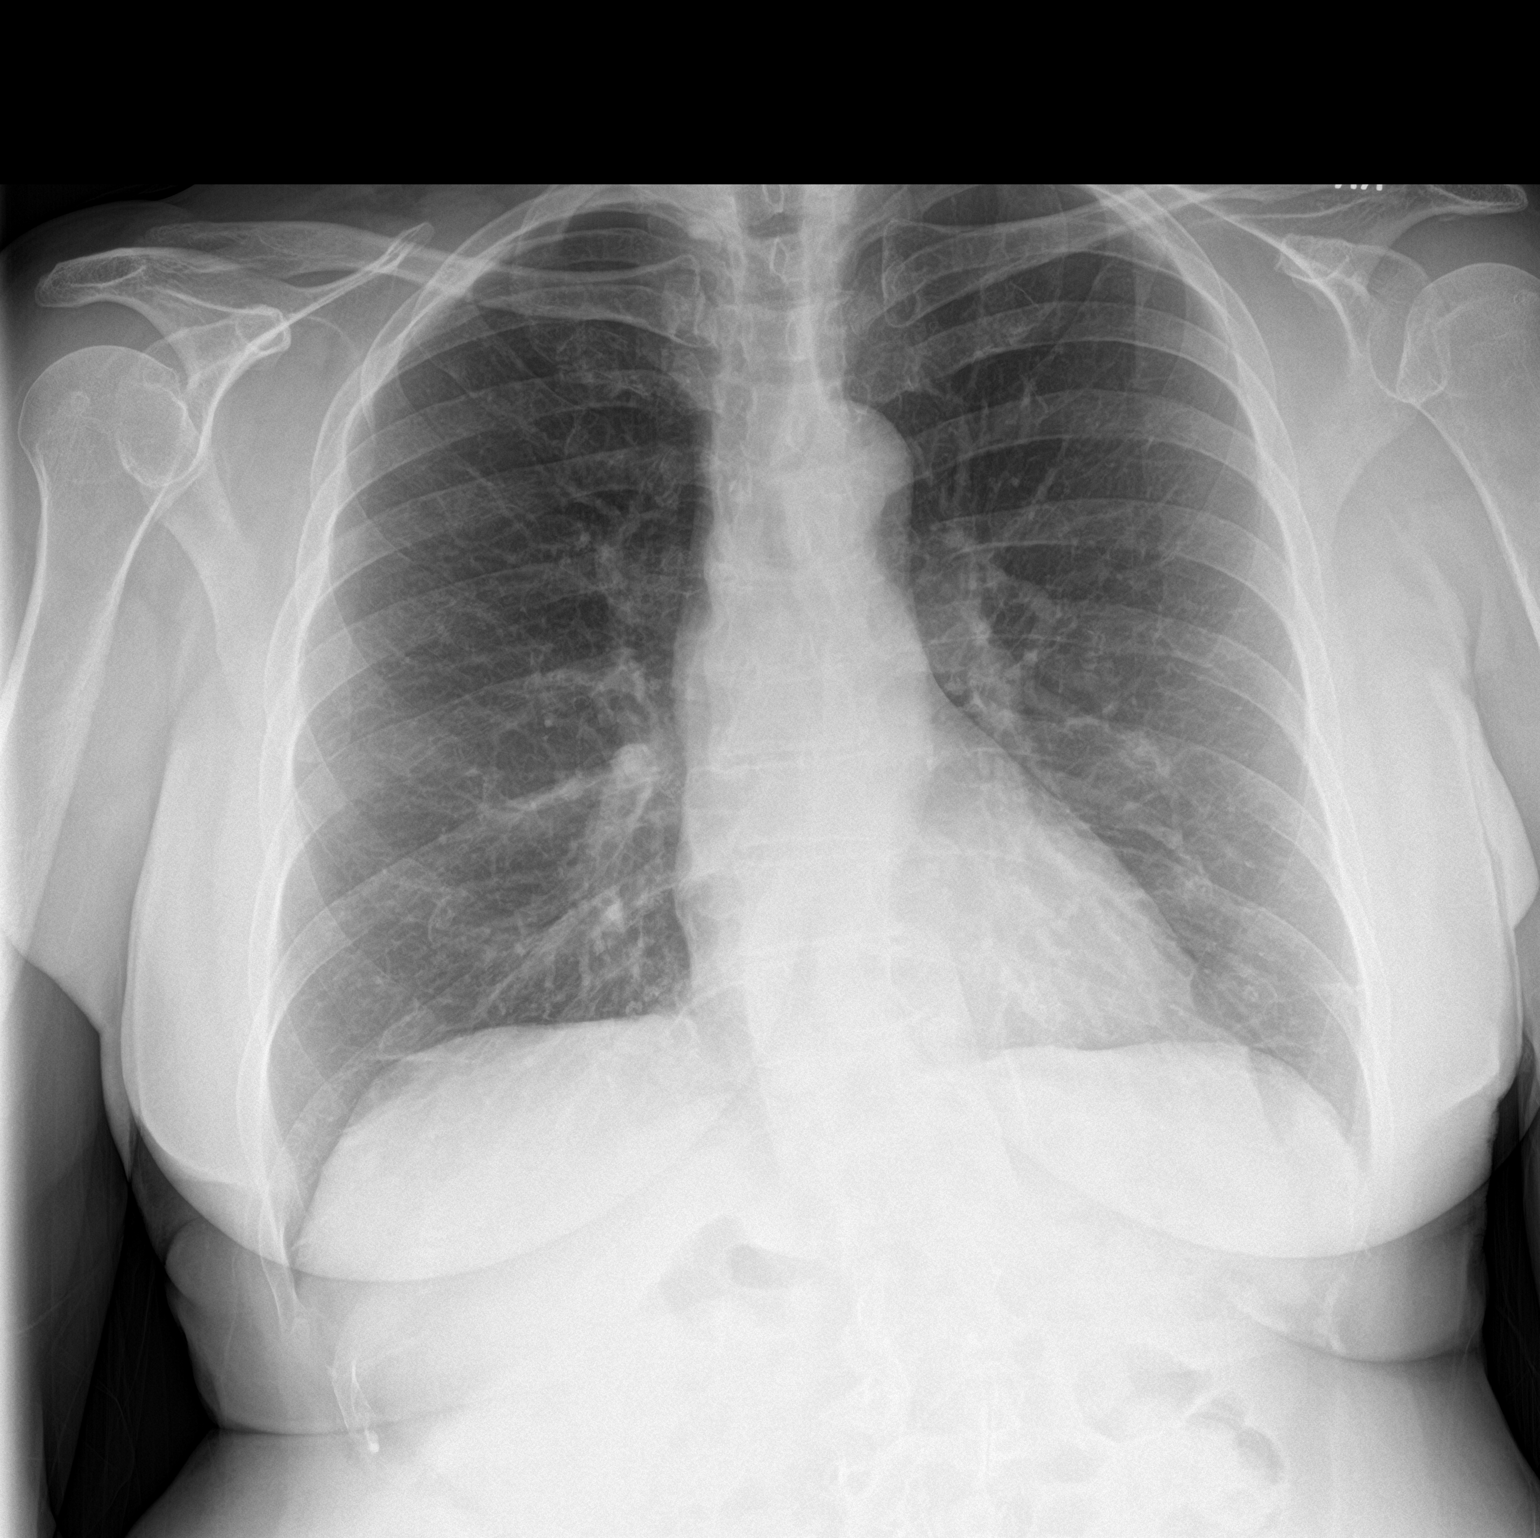

[chest lat]
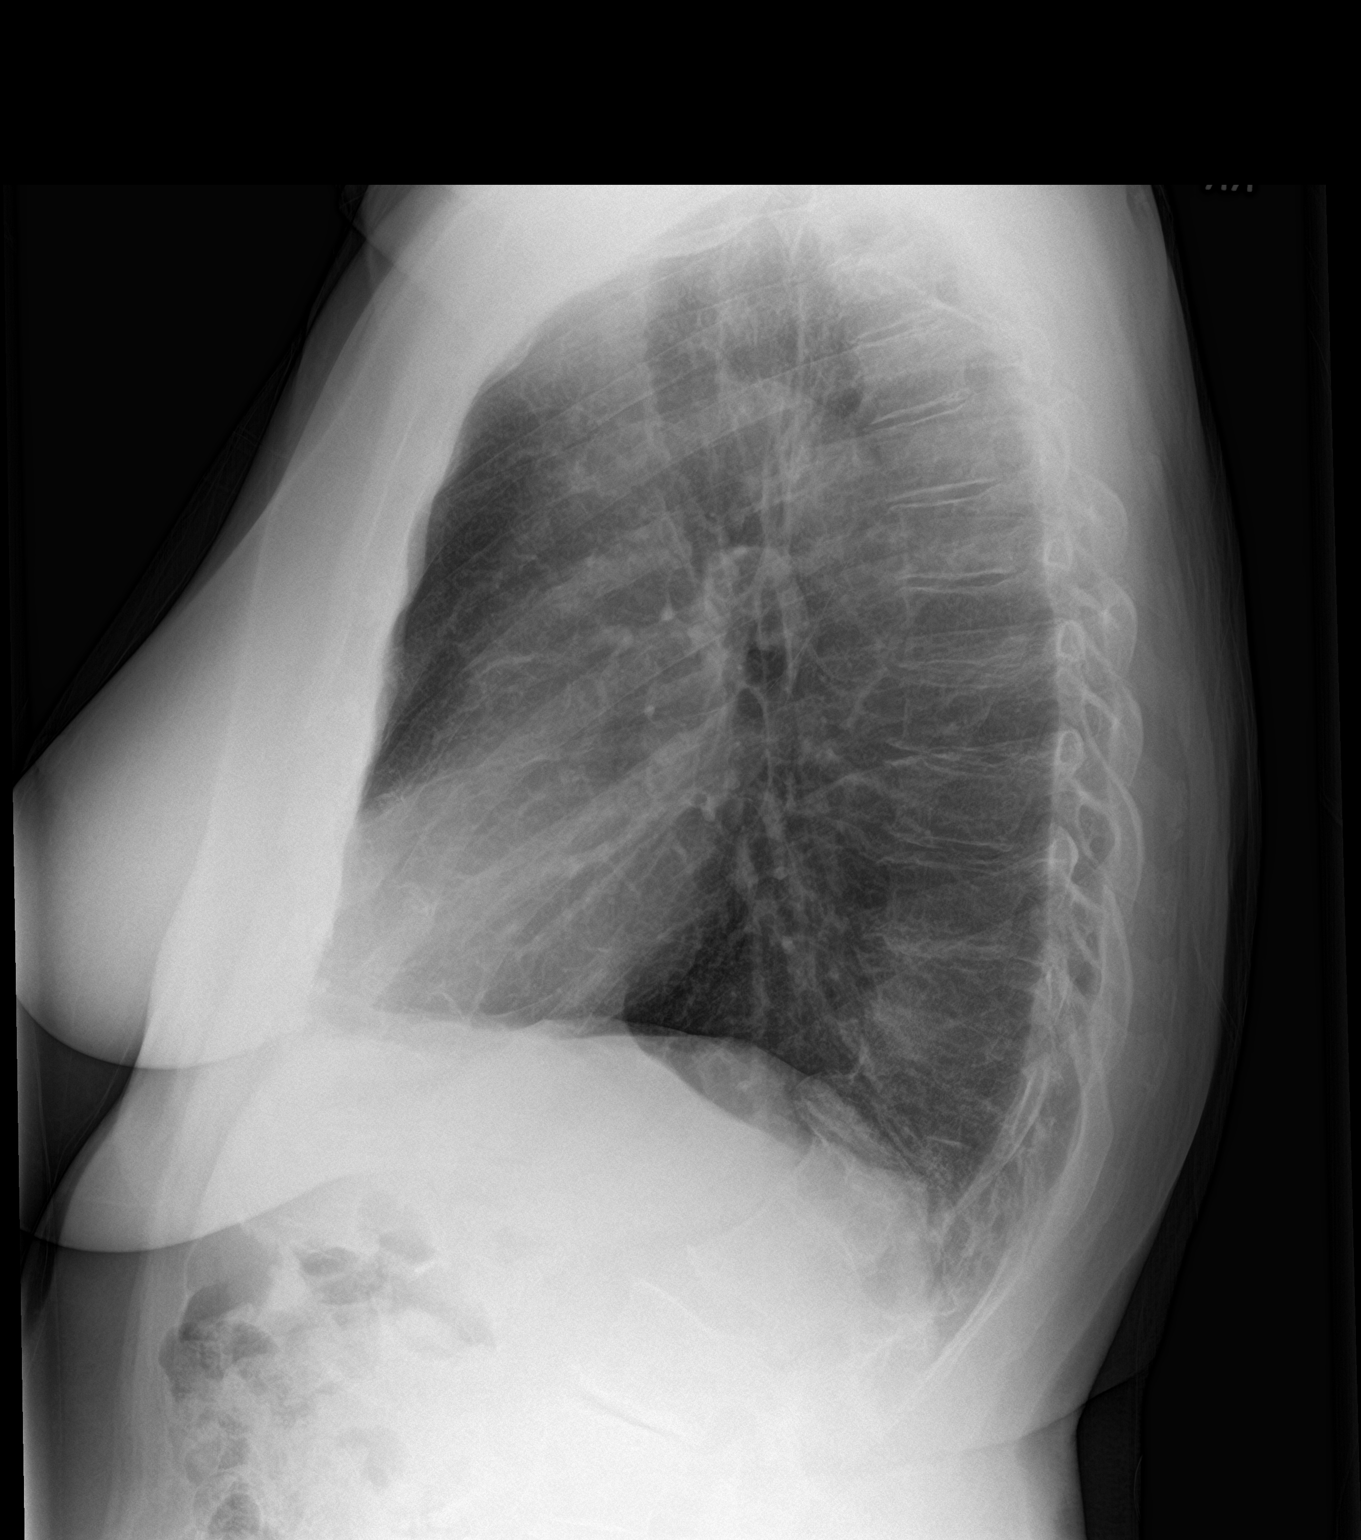

[2 of 2 positions shown; findings below may reference images not displayed]

FINDINGS: The heart size is normal. Subtle linear densities at both lung bases
likely reflects atelectasis. No significant airspace consolidation
is present. No edema or effusion is present. Congenital fusion again
noted at L1-2 with focal exaggerated kyphosis.
IMPRESSION: 1. Subtle bibasilar atelectasis.
2. No other acute cardiopulmonary disease.

## 2022-04-30 ENCOUNTER — Ambulatory Visit (INDEPENDENT_AMBULATORY_CARE_PROVIDER_SITE_OTHER): Payer: Medicare Other | Admitting: Obstetrics and Gynecology

## 2022-04-30 ENCOUNTER — Encounter: Payer: Self-pay | Admitting: Obstetrics and Gynecology

## 2022-04-30 VITALS — BP 116/78 | HR 76 | Wt 170.0 lb

## 2022-04-30 DIAGNOSIS — N393 Stress incontinence (female) (male): Secondary | ICD-10-CM | POA: Diagnosis not present

## 2022-04-30 DIAGNOSIS — R339 Retention of urine, unspecified: Secondary | ICD-10-CM

## 2022-04-30 DIAGNOSIS — Z01818 Encounter for other preprocedural examination: Secondary | ICD-10-CM

## 2022-04-30 DIAGNOSIS — N816 Rectocele: Secondary | ICD-10-CM | POA: Diagnosis not present

## 2022-04-30 MED ORDER — POLYETHYLENE GLYCOL 3350 17 GM/SCOOP PO POWD: 17.0000 g | Freq: Every day | ORAL | 0 refills | Status: DC

## 2022-04-30 MED ORDER — IBUPROFEN 600 MG PO TABS: 600.0000 mg | ORAL_TABLET | Freq: Four times a day (QID) | ORAL | 0 refills | Status: DC | PRN

## 2022-04-30 MED ORDER — ACETAMINOPHEN 500 MG PO TABS: 500.0000 mg | ORAL_TABLET | Freq: Four times a day (QID) | ORAL | 0 refills | Status: DC | PRN

## 2022-04-30 MED ORDER — TRAMADOL HCL 50 MG PO TABS: 50.0000 mg | ORAL_TABLET | Freq: Three times a day (TID) | ORAL | 0 refills | Status: AC | PRN

## 2022-04-30 NOTE — Progress Notes (Signed)
[facility]rogynecology Pre-Operative visit  Subjective Chief Complaint: Shannon [name] presents for a preoperative encounter.   History of Present Illness: Shannon [name] is a 72 y.o. female who presents for preoperative visit.  She is scheduled to undergo Exam under anesthesia, anterior and posterior repair, perineorrhaphy, cystoscopy, possible sacrospinous ligament fixation on 05/13/21.  Her symptoms include vaginal bulge and incontinence, and she was was found to have Stage II anterior, Stage II posterior, Stage 0 apical prolapse.   Urodynamic Impression:  1. Sensation was normal; capacity was increased 2. Stress Incontinence was demonstrated at normal pressures; 3. Detrusor Overactivity was not demonstrated 4. Emptying was dysfunctional with a elevated PVR (528m), a sustained detrusor contraction present,  abdominal straining present, dyssynergic urethral sphincter activity on EMG.  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 Left torn meniscus Right arthritis   Lead exposure 03/02/2018   Lumbar degenerative disc disease 02/24/2017   MRI 2019 confirms multilevel change  Sciatica at times 2/2 this and periodic radiculopathy   Major depressive disorder 03/08/2013   Melanocytic nevi of trunk    Menopause    Neck pain on left side 09/10/2018   Suspect this is DDD and DJD - XR is pending Neurapraxic spasm to left trapezius   Neurofibroma 07/20/2019   Nuclear sclerotic cataract of both eyes  07/23/2012   OAB (overactive bladder) 08/05/2016   Osteopenia    Rosacea    Sciatica of left side 02/24/2017   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   By exam she has chronic tendinopathy of Glut Med   Urinary urgency 09/17/2020   Vertigo 04/12/2019   Vitamin D deficiency 02/28/2014     Past Surgical History:  Procedure Laterality Date   blephoroplasty     BROW LIFT     DILATION AND CURETTAGE OF UTERUS     SKIN CANCER EXCISION     right lower lip   TONSILLECTOMY AND ADENOIDECTOMY      is allergic to hydrocodone-acetaminophen, gluten meal, and oxycodone.   Family History  Problem Relation Age of Onset   Colon polyps Mother    Parkinson's disease Mother        developed Parkinson's disease dementia   Diabetes Father    Emphysema Father    Tuberculosis Father    Alcohol abuse Sister    Cancer Sister        skin cancer, melanoma cell, basil cell   Cancer Sister        melanoma   Anxiety disorder Brother    Parkinson's disease Maternal Grandmother        developed Parkinson's disease dementia   Colon cancer Maternal Grandfather    ADD / ADHD Daughter    Anxiety disorder Daughter    Depression Daughter    ADD / ADHD Son    Anxiety disorder Son    Depression Son     Social History   Tobacco Use  Smoking status: Former    Types: Cigarettes    Quit date: 08/20/1970    Years since quitting: 51.7   Smokeless tobacco: Never  Vaping Use   Vaping Use: Never used  Substance Use Topics   Alcohol use: Yes    Alcohol/week: 3.0 - 4.0 standard drinks of alcohol    Types: 1 - 2 Glasses of wine, 1 Cans of beer, 1 Shots of liquor per week    Comment: not every night.   Drug use: No     Review of Systems was negative for a full 10 system review except as noted in the History of Present Illness.   Current Outpatient Medications:    acetaminophen (TYLENOL) 500 MG tablet, Take 1 tablet (500 mg total) by mouth every 6 (six) hours as needed (pain)., Disp: 30  tablet, Rfl: 0   Albuterol Sulfate (PROAIR RESPICLICK) 188 (90 Base) MCG/ACT AEPB, Inhale 2 puffs every 4-6 hours as needed for cough and shortness of breath., Disp: 1 each, Rfl: 0   COVID-19 mRNA vaccine 2023-2024 (COMIRNATY) syringe, Inject into the muscle., Disp: 0.3 mL, Rfl: 0   dextroamphetamine (DEXTROSTAT) 10 MG tablet, Take 1 tablet (10 mg total) by mouth daily., Disp: 30 tablet, Rfl: 0   escitalopram (LEXAPRO) 10 MG tablet, Take 1 tablet (10 mg total) by mouth daily., Disp: 90 tablet, Rfl: 1   GAMMA AMINOBUTYRIC ACID PO, Take 200 mg by mouth as needed. Up to bid, Disp: , Rfl:    hydrocortisone (ANUSOL-HC) 2.5 % rectal cream, Place 1 Application rectally 2 (two) times daily., Disp: 30 g, Rfl: 0   ibuprofen (ADVIL) 600 MG tablet, Take 1 tablet (600 mg total) by mouth every 6 (six) hours as needed., Disp: 30 tablet, Rfl: 0   Krill Oil 1000 MG CAPS, , Disp: , Rfl:    LORazepam (ATIVAN) 2 MG tablet, Take 1 tablet (2 mg total) by mouth every 8 (eight) hours as needed for anxiety., Disp: 40 tablet, Rfl: 0   MAGNESIUM GLYCINATE PLUS PO, Take 100 mg by mouth 2 (two) times daily., Disp: , Rfl:    nystatin ointment (MYCOSTATIN), , Disp: , Rfl:    polyethylene glycol powder (GLYCOLAX/MIRALAX) 17 GM/SCOOP powder, Take 17 g by mouth daily. Drink 17g (1 scoop) dissolved in water per day., Disp: 255 g, Rfl: 0   TIROSINT 88 MCG CAPS, TAKE ONE Capsule BY MOUTH EVERY DAY BEFORE BREAKFAST, Disp: 90 capsule, Rfl: 1   traMADol (ULTRAM) 50 MG tablet, Take 1 tablet (50 mg total) by mouth every 8 (eight) hours as needed for up to 5 days., Disp: 5 tablet, Rfl: 0   Objective Vitals:   04/30/22 0919  BP: 116/78  Pulse: 76    Gen: NAD CV: S1 S2 RRR Lungs: Clear to auscultation bilaterally Abd: soft, nontender   Previous Pelvic Exam showed: POP-Q (02/27/22) :    POP-Q   -1                                            Aa   -1                                           Ba   -7.5  C    4.5                                            Gh   4                                            Pb   8.5                                            tvl    2                                            Ap   2                                            Bp   -7.5                                              Assessment/ Plan  Assessment: The patient is a 72 y.o. year old scheduled to undergo Exam under anesthesia, anterior and posterior repair, perineorrhaphy, cystoscopy, possible sacrospinous ligament fixation. Verbal consent was obtained for these procedures.  Plan:  We discussed the results of the urodynamic testing. She feels she was not comfortable enough to void at the end of the study and had elevated PVR with lots of straining present. We discussed that it would be better to plan for an interval procedure for her incontinence (previously was planning urethral bulking).   General Surgical Consent: The patient has previously been counseled on alternative treatments, and the decision by the patient and provider was to proceed with the procedure listed above.  For all procedures, there are risks of bleeding, infection, damage to surrounding organs including but not limited to bowel, bladder, blood vessels, ureters and nerves, and need for further surgery if an injury were to occur. These risks are all low with minimally invasive surgery.   There are risks of numbness and weakness at any body site or buttock/rectal pain.  It is possible that baseline pain can be worsened by surgery, either with or without mesh. If surgery is vaginal, there is also a low risk of possible conversion to laparoscopy or open abdominal incision where indicated. Very rare risks include blood transfusion, blood clot, heart attack, pneumonia, or death.   There is also a risk of short-term postoperative urinary retention with need to use a catheter. About half of patients need to go  home from surgery with a catheter, which is then later removed in the office. The risk of long-term need for a catheter is very low. There is also a risk of worsening of overactive bladder.     Prolapse (with or without mesh): Risk factors for surgical failure  include things that put  pressure on your pelvis and the surgical repair, including obesity, chronic cough, and heavy lifting or straining (including lifting children or adults, straining on the toilet, or lifting heavy objects such as furniture or anything weighing >25 lbs. Risks of recurrence is 20-30% with vaginal native tissue repair and a less than 10% with sacrocolpopexy with mesh.    We discussed consent for blood products. Risks for blood transfusion include allergic reactions, other reactions that can affect different body organs and managed accordingly, transmission of infectious diseases such as HIV or Hepatitis. However, the blood is screened. Patient consents for blood products.  Pre-operative instructions:  She was instructed to not take Aspirin/NSAIDs x 7days prior to surgery. Antibiotic prophylaxis was ordered as indicated.  Catheter use: Patient will go home with foley if needed after post-operative voiding trial.  Post-operative instructions:  She was provided with specific post-operative instructions, including precautions and signs/symptoms for which we would recommend contacting us, in addition to daytime and after-hours contact phone numbers. This was provided on a handout.   Post-operative medications: Prescriptions for motrin, tylenol, miralax, and tramadol were sent to her pharmacy. Discussed using ibuprofen and tylenol on a schedule to limit use of narcotics.   Laboratory testing:  none  Preoperative clearance:  She does not require surgical clearance.    Post-operative follow-up:  A post-operative appointment will be made for 6 weeks from the date of surgery. If she needs a post-operative nurse visit for a voiding  trial, that will be set up after she leaves the hospital.    Patient will call the clinic or use MyChart should anything change or any new issues arise.   Jaquita Folds, MD   Time spent: I spent 25 minutes dedicated to the care of this patient on the date of this encounter to include pre-visit review of records, face-to-face time with the patient and post visit documentation and ordering medication/ testing.

## 2022-04-30 NOTE — H&P (Signed)
[facility] Pre-Operative H&P  Subjective Chief Complaint: Shannon [name] presents for a preoperative encounter.   History of Present Illness: Shannon [name] is a 72 y.o. female who presents for preoperative visit.  She is scheduled to undergo Exam under anesthesia, anterior and posterior repair, perineorrhaphy, cystoscopy, possible sacrospinous ligament fixation on 05/13/21.  Her symptoms include vaginal bulge and incontinence, and she was was found to have Stage II anterior, Stage II posterior, Stage 0 apical prolapse.   Urodynamic Impression:  1. Sensation was normal; capacity was increased 2. Stress Incontinence was demonstrated at normal pressures; 3. Detrusor Overactivity was not demonstrated 4. Emptying was dysfunctional with a elevated PVR (567m), a sustained detrusor contraction present,  abdominal straining present, dyssynergic urethral sphincter activity on EMG.  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 Left torn meniscus Right arthritis   Lead exposure 03/02/2018   Lumbar degenerative disc disease 02/24/2017   MRI 2019 confirms multilevel change  Sciatica at times 2/2 this and periodic radiculopathy   Major depressive disorder 03/08/2013   Melanocytic nevi of trunk    Menopause    Neck pain on left side 09/10/2018   Suspect this is DDD and DJD - XR is pending Neurapraxic spasm to left trapezius   Neurofibroma 07/20/2019   Nuclear sclerotic cataract of both eyes  07/23/2012   OAB (overactive bladder) 08/05/2016   Osteopenia    Rosacea    Sciatica of left side 02/24/2017   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   By exam she has chronic tendinopathy of Glut Med   Urinary urgency 09/17/2020   Vertigo 04/12/2019   Vitamin D deficiency 02/28/2014     Past Surgical History:  Procedure Laterality Date   blephoroplasty     BROW LIFT     DILATION AND CURETTAGE OF UTERUS     SKIN CANCER EXCISION     right lower lip   TONSILLECTOMY AND ADENOIDECTOMY      is allergic to hydrocodone-acetaminophen, gluten meal, and oxycodone.   Family History  Problem Relation Age of Onset   Colon polyps Mother    Parkinson's disease Mother        developed Parkinson's disease dementia   Diabetes Father    Emphysema Father    Tuberculosis Father    Alcohol abuse Sister    Cancer Sister        skin cancer, melanoma cell, basil cell   Cancer Sister        melanoma   Anxiety disorder Brother    Parkinson's disease Maternal Grandmother        developed Parkinson's disease dementia   Colon cancer Maternal Grandfather    ADD / ADHD Daughter    Anxiety disorder Daughter    Depression Daughter    ADD / ADHD Son    Anxiety disorder Son    Depression Son     Social History   Tobacco Use  Smoking status: Former    Types: Cigarettes    Quit date: 08/20/1970    Years since quitting: 51.7   Smokeless tobacco: Never  Vaping Use   Vaping Use: Never used  Substance Use Topics   Alcohol use: Yes    Alcohol/week: 3.0 - 4.0 standard drinks of alcohol    Types: 1 - 2 Glasses of wine, 1 Cans of beer, 1 Shots of liquor per week    Comment: not every night.   Drug use: No     Review of Systems was negative for a full 10 system review except as noted in the History of Present Illness.  No current facility-administered medications for this encounter.  Current Outpatient Medications:    acetaminophen (TYLENOL) 500 MG tablet, Take 1 tablet (500 mg  total) by mouth every 6 (six) hours as needed (pain)., Disp: 30 tablet, Rfl: 0   Albuterol Sulfate (PROAIR RESPICLICK) 034 (90 Base) MCG/ACT AEPB, Inhale 2 puffs every 4-6 hours as needed for cough and shortness of breath., Disp: 1 each, Rfl: 0   COVID-19 mRNA vaccine 2023-2024 (COMIRNATY) syringe, Inject into the muscle., Disp: 0.3 mL, Rfl: 0   dextroamphetamine (DEXTROSTAT) 10 MG tablet, Take 1 tablet (10 mg total) by mouth daily., Disp: 30 tablet, Rfl: 0   escitalopram (LEXAPRO) 10 MG tablet, Take 1 tablet (10 mg total) by mouth daily., Disp: 90 tablet, Rfl: 1   GAMMA AMINOBUTYRIC ACID PO, Take 200 mg by mouth as needed. Up to bid, Disp: , Rfl:    hydrocortisone (ANUSOL-HC) 2.5 % rectal cream, Place 1 Application rectally 2 (two) times daily., Disp: 30 g, Rfl: 0   ibuprofen (ADVIL) 600 MG tablet, Take 1 tablet (600 mg total) by mouth every 6 (six) hours as needed., Disp: 30 tablet, Rfl: 0   Krill Oil 1000 MG CAPS, , Disp: , Rfl:    LORazepam (ATIVAN) 2 MG tablet, Take 1 tablet (2 mg total) by mouth every 8 (eight) hours as needed for anxiety., Disp: 40 tablet, Rfl: 0   MAGNESIUM GLYCINATE PLUS PO, Take 100 mg by mouth 2 (two) times daily., Disp: , Rfl:    nystatin ointment (MYCOSTATIN), , Disp: , Rfl:    polyethylene glycol powder (GLYCOLAX/MIRALAX) 17 GM/SCOOP powder, Take 17 g by mouth daily. Drink 17g (1 scoop) dissolved in water per day., Disp: 255 g, Rfl: 0   TIROSINT 88 MCG CAPS, TAKE ONE Capsule BY MOUTH EVERY DAY BEFORE BREAKFAST, Disp: 90 capsule, Rfl: 1   traMADol (ULTRAM) 50 MG tablet, Take 1 tablet (50 mg total) by mouth every 8 (eight) hours as needed for up to 5 days., Disp: 5 tablet, Rfl: 0   Objective There were no vitals filed for this visit.   Gen: NAD CV: S1 S2 RRR Lungs: Clear to auscultation bilaterally Abd: soft, nontender   Previous Pelvic Exam showed: POP-Q (02/27/22) :    POP-Q   -1                                            Aa   -1                                            Ba   -7.5  C    4.5                                            Gh   4                                            Pb   8.5                                            tvl    2                                            Ap   2                                            Bp   -7.5                                              Assessment/ Plan  The patient is a 72 y.o. year old scheduled to undergo Exam under anesthesia, anterior and posterior repair, perineorrhaphy, cystoscopy, possible sacrospinous ligament fixation.   Jaquita Folds, MD

## 2022-05-03 ENCOUNTER — Encounter: Payer: Self-pay | Admitting: Obstetrics and Gynecology

## 2022-05-09 ENCOUNTER — Encounter (HOSPITAL_BASED_OUTPATIENT_CLINIC_OR_DEPARTMENT_OTHER): Payer: Self-pay | Admitting: Obstetrics and Gynecology

## 2022-05-09 NOTE — Progress Notes (Signed)
Spoke w/ via phone for pre-op interview--- [facility] Lab needs dos---- NONE              Lab results------ COVID test -----patient states asymptomatic no test needed Arrive at -------0730 NPO after MN NO Solid Food.   Med rec completed Medications to take morning of surgery -----Tirosent Diabetic medication ----- Patient instructed no nail polish to be worn day of surgery Patient instructed to bring photo id and insurance card day of surgery Patient aware to have Driver (ride ) / caregiver  Comer Locket  for 24 hours after surgery  Patient Special Instructions ----- Pre-Op special Istructions ----- Patient verbalized understanding of instructions that were given at this phone interview. Patient denies shortness of breath, chest pain, fever, cough at this phone interview.

## 2022-05-13 ENCOUNTER — Encounter (HOSPITAL_BASED_OUTPATIENT_CLINIC_OR_DEPARTMENT_OTHER): Payer: Self-pay | Admitting: Obstetrics and Gynecology

## 2022-05-13 ENCOUNTER — Other Ambulatory Visit: Payer: Self-pay

## 2022-05-13 ENCOUNTER — Ambulatory Visit (HOSPITAL_BASED_OUTPATIENT_CLINIC_OR_DEPARTMENT_OTHER): Payer: Medicare Other | Admitting: Anesthesiology

## 2022-05-13 ENCOUNTER — Telehealth: Payer: Self-pay | Admitting: Obstetrics and Gynecology

## 2022-05-13 ENCOUNTER — Encounter (HOSPITAL_BASED_OUTPATIENT_CLINIC_OR_DEPARTMENT_OTHER)
Admission: RE | Disposition: A | Discharge status: Home / Self Care | Payer: Self-pay | Source: Ambulatory Visit | Attending: Obstetrics and Gynecology

## 2022-05-13 ENCOUNTER — Ambulatory Visit (HOSPITAL_BASED_OUTPATIENT_CLINIC_OR_DEPARTMENT_OTHER)
Admission: RE | Admit: 2022-05-13 | Discharge: 2022-05-13 | Disposition: A | Discharge status: Home / Self Care | Payer: Medicare Other | Source: Ambulatory Visit | Attending: Obstetrics and Gynecology | Admitting: Obstetrics and Gynecology

## 2022-05-13 DIAGNOSIS — Z87891 Personal history of nicotine dependence: Secondary | ICD-10-CM

## 2022-05-13 DIAGNOSIS — E1122 Type 2 diabetes mellitus with diabetic chronic kidney disease: Secondary | ICD-10-CM | POA: Diagnosis not present

## 2022-05-13 DIAGNOSIS — N189 Chronic kidney disease, unspecified: Secondary | ICD-10-CM | POA: Diagnosis not present

## 2022-05-13 DIAGNOSIS — Z28311 Partially vaccinated for covid-19: Secondary | ICD-10-CM | POA: Insufficient documentation

## 2022-05-13 DIAGNOSIS — I129 Hypertensive chronic kidney disease with stage 1 through stage 4 chronic kidney disease, or unspecified chronic kidney disease: Secondary | ICD-10-CM

## 2022-05-13 DIAGNOSIS — N138 Other obstructive and reflux uropathy: Secondary | ICD-10-CM

## 2022-05-13 DIAGNOSIS — N816 Rectocele: Secondary | ICD-10-CM | POA: Diagnosis not present

## 2022-05-13 DIAGNOSIS — Z86018 Personal history of other benign neoplasm: Secondary | ICD-10-CM | POA: Diagnosis not present

## 2022-05-13 DIAGNOSIS — N393 Stress incontinence (female) (male): Secondary | ICD-10-CM | POA: Diagnosis not present

## 2022-05-13 DIAGNOSIS — N8110 Cystocele, unspecified: Secondary | ICD-10-CM | POA: Diagnosis not present

## 2022-05-13 DIAGNOSIS — K219 Gastro-esophageal reflux disease without esophagitis: Secondary | ICD-10-CM | POA: Diagnosis not present

## 2022-05-13 DIAGNOSIS — J45909 Unspecified asthma, uncomplicated: Secondary | ICD-10-CM | POA: Diagnosis not present

## 2022-05-13 DIAGNOSIS — E039 Hypothyroidism, unspecified: Secondary | ICD-10-CM | POA: Diagnosis not present

## 2022-05-13 HISTORY — DX: Nausea with vomiting, unspecified: R11.2

## 2022-05-13 HISTORY — DX: Other specified postprocedural states: Z98.890

## 2022-05-13 HISTORY — PX: CYSTOSCOPY: SHX5120

## 2022-05-13 HISTORY — PX: ANTERIOR AND POSTERIOR REPAIR: SHX5121

## 2022-05-13 SURGERY — CYSTOSCOPY
Anesthesia: General | Site: Vagina

## 2022-05-13 MED ORDER — POVIDONE-IODINE 10 % EX SWAB: 2.0000 | Freq: Once | CUTANEOUS | Status: DC

## 2022-05-13 MED ORDER — FENTANYL CITRATE (PF) 100 MCG/2ML IJ SOLN
INTRAMUSCULAR | Status: AC
  Filled 2022-05-13: qty 2

## 2022-05-13 MED ORDER — PHENAZOPYRIDINE HCL 100 MG PO TABS
ORAL_TABLET | ORAL | Status: AC
  Filled 2022-05-13: qty 2

## 2022-05-13 MED ORDER — PROPOFOL 500 MG/50ML IV EMUL
INTRAVENOUS | Status: DC | PRN
  Administered 2022-05-13: 50 ug/kg/min via INTRAVENOUS

## 2022-05-13 MED ORDER — ONDANSETRON HCL 4 MG/2ML IJ SOLN
INTRAMUSCULAR | Status: DC | PRN
  Administered 2022-05-13: 4 mg via INTRAVENOUS

## 2022-05-13 MED ORDER — FENTANYL CITRATE (PF) 100 MCG/2ML IJ SOLN: 25.0000 ug | INTRAMUSCULAR | Status: DC | PRN

## 2022-05-13 MED ORDER — KETOROLAC TROMETHAMINE 30 MG/ML IJ SOLN
INTRAMUSCULAR | Status: AC
  Filled 2022-05-13: qty 1

## 2022-05-13 MED ORDER — PHENYLEPHRINE 80 MCG/ML (10ML) SYRINGE FOR IV PUSH (FOR BLOOD PRESSURE SUPPORT)
PREFILLED_SYRINGE | INTRAVENOUS | Status: AC
  Filled 2022-05-13: qty 10

## 2022-05-13 MED ORDER — PROPOFOL 10 MG/ML IV BOLUS
INTRAVENOUS | Status: DC | PRN
  Administered 2022-05-13: 120 mg via INTRAVENOUS

## 2022-05-13 MED ORDER — MIDAZOLAM HCL 5 MG/5ML IJ SOLN
INTRAMUSCULAR | Status: DC | PRN
  Administered 2022-05-13: 2 mg via INTRAVENOUS

## 2022-05-13 MED ORDER — CEFAZOLIN SODIUM-DEXTROSE 2-4 GM/100ML-% IV SOLN
2.0000 g | INTRAVENOUS | Status: AC
  Administered 2022-05-13: 2 g via INTRAVENOUS

## 2022-05-13 MED ORDER — ACETAMINOPHEN 500 MG PO TABS
ORAL_TABLET | ORAL | Status: AC
  Filled 2022-05-13: qty 2

## 2022-05-13 MED ORDER — LACTATED RINGERS IV SOLN
INTRAVENOUS | Status: DC
  Administered 2022-05-13: 1000 mL via INTRAVENOUS

## 2022-05-13 MED ORDER — KETOROLAC TROMETHAMINE 30 MG/ML IJ SOLN
INTRAMUSCULAR | Status: DC | PRN
  Administered 2022-05-13: 30 mg via INTRAVENOUS

## 2022-05-13 MED ORDER — SODIUM CHLORIDE 0.9 % IR SOLN
Status: DC | PRN
  Administered 2022-05-13: 1000 mL via INTRAVESICAL

## 2022-05-13 MED ORDER — LIDOCAINE HCL (PF) 2 % IJ SOLN
INTRAMUSCULAR | Status: AC
  Filled 2022-05-13: qty 5

## 2022-05-13 MED ORDER — AMISULPRIDE (ANTIEMETIC) 5 MG/2ML IV SOLN: 10.0000 mg | Freq: Once | INTRAVENOUS | Status: DC | PRN

## 2022-05-13 MED ORDER — PROPOFOL 1000 MG/100ML IV EMUL
INTRAVENOUS | Status: AC
  Filled 2022-05-13: qty 100

## 2022-05-13 MED ORDER — PROPOFOL 10 MG/ML IV BOLUS
INTRAVENOUS | Status: AC
  Filled 2022-05-13: qty 20

## 2022-05-13 MED ORDER — ONDANSETRON HCL 4 MG/2ML IJ SOLN
INTRAMUSCULAR | Status: AC
  Filled 2022-05-13: qty 2

## 2022-05-13 MED ORDER — DEXAMETHASONE SODIUM PHOSPHATE 4 MG/ML IJ SOLN
INTRAMUSCULAR | Status: DC | PRN
  Administered 2022-05-13: 5 mg via INTRAVENOUS

## 2022-05-13 MED ORDER — LIDOCAINE HCL (CARDIAC) PF 100 MG/5ML IV SOSY
PREFILLED_SYRINGE | INTRAVENOUS | Status: DC | PRN
  Administered 2022-05-13: 100 mg via INTRAVENOUS

## 2022-05-13 MED ORDER — PHENYLEPHRINE HCL (PRESSORS) 10 MG/ML IV SOLN
INTRAVENOUS | Status: DC | PRN
  Administered 2022-05-13 (×4): 80 ug via INTRAVENOUS

## 2022-05-13 MED ORDER — 0.9 % SODIUM CHLORIDE (POUR BTL) OPTIME
TOPICAL | Status: DC | PRN
  Administered 2022-05-13: 500 mL

## 2022-05-13 MED ORDER — ACETAMINOPHEN 500 MG PO TABS
1000.0000 mg | ORAL_TABLET | ORAL | Status: AC
  Administered 2022-05-13: 1000 mg via ORAL

## 2022-05-13 MED ORDER — EPHEDRINE SULFATE (PRESSORS) 50 MG/ML IJ SOLN
INTRAMUSCULAR | Status: DC | PRN
  Administered 2022-05-13: 5 mg via INTRAVENOUS
  Administered 2022-05-13 (×2): 10 mg via INTRAVENOUS

## 2022-05-13 MED ORDER — ACETAMINOPHEN 500 MG PO TABS: 1000.0000 mg | ORAL_TABLET | Freq: Once | ORAL | Status: DC

## 2022-05-13 MED ORDER — EPHEDRINE 5 MG/ML INJ
INTRAVENOUS | Status: AC
  Filled 2022-05-13: qty 5

## 2022-05-13 MED ORDER — LIDOCAINE-EPINEPHRINE 1 %-1:100000 IJ SOLN
INTRAMUSCULAR | Status: DC | PRN
  Administered 2022-05-13: 15 mL

## 2022-05-13 MED ORDER — MIDAZOLAM HCL 2 MG/2ML IJ SOLN
INTRAMUSCULAR | Status: AC
  Filled 2022-05-13: qty 2

## 2022-05-13 MED ORDER — FENTANYL CITRATE (PF) 100 MCG/2ML IJ SOLN
INTRAMUSCULAR | Status: DC | PRN
  Administered 2022-05-13 (×2): 50 ug via INTRAVENOUS

## 2022-05-13 MED ORDER — PHENAZOPYRIDINE HCL 100 MG PO TABS
200.0000 mg | ORAL_TABLET | ORAL | Status: AC
  Administered 2022-05-13: 200 mg via ORAL

## 2022-05-13 MED ORDER — CEFAZOLIN SODIUM-DEXTROSE 2-4 GM/100ML-% IV SOLN
INTRAVENOUS | Status: AC
  Filled 2022-05-13: qty 100

## 2022-05-13 MED ORDER — DEXAMETHASONE SODIUM PHOSPHATE 10 MG/ML IJ SOLN
INTRAMUSCULAR | Status: AC
  Filled 2022-05-13: qty 1

## 2022-05-13 SURGICAL SUPPLY — 33 items
AGENT HMST KT MTR STRL THRMB (HEMOSTASIS)
BLADE CLIPPER SENSICLIP SURGIC (BLADE) ×3 IMPLANT
BLADE SURG 15 STRL LF DISP TIS (BLADE) ×4 IMPLANT
BLADE SURG 15 STRL SS (BLADE) ×6
DEVICE CAPIO SLIM SINGLE (INSTRUMENTS) IMPLANT
GAUZE 4X4 16PLY ~~LOC~~+RFID DBL (SPONGE) IMPLANT
GLOVE BIOGEL PI IND STRL 6.5 (GLOVE) ×3 IMPLANT
GLOVE BIOGEL PI IND STRL 7.0 (GLOVE) ×3 IMPLANT
GLOVE ECLIPSE 6.0 STRL STRAW (GLOVE) ×3 IMPLANT
GOWN STRL REUS W/TWL LRG LVL3 (GOWN DISPOSABLE) ×3 IMPLANT
HIBICLENS CHG 4% 4OZ BTL (MISCELLANEOUS) ×3 IMPLANT
HOLDER FOLEY CATH W/STRAP (MISCELLANEOUS) ×3 IMPLANT
KIT TURNOVER CYSTO (KITS) ×3 IMPLANT
NDL MAYO 6 CRC TAPER PT (NEEDLE) IMPLANT
NEEDLE HYPO 22GX1.5 SAFETY (NEEDLE) ×3 IMPLANT
NEEDLE MAYO 6 CRC TAPER PT (NEEDLE) IMPLANT
NS IRRIG 1000ML POUR BTL (IV SOLUTION) ×3 IMPLANT
PACK VAGINAL WOMENS (CUSTOM PROCEDURE TRAY) ×3 IMPLANT
PAD OB MATERNITY 4.3X12.25 (PERSONAL CARE ITEMS) ×3 IMPLANT
RETRACTOR LONE STAR DISPOSABLE (INSTRUMENTS) ×3 IMPLANT
RETRACTOR STAY HOOK 5MM (MISCELLANEOUS) ×3 IMPLANT
SET IRRIG Y TYPE TUR BLADDER L (SET/KITS/TRAYS/PACK) ×3 IMPLANT
SPIKE FLUID TRANSFER (MISCELLANEOUS) IMPLANT
SURGIFLO W/THROMBIN 8M KIT (HEMOSTASIS) IMPLANT
SUT ABS MONO DBL WITH NDL 48IN (SUTURE) IMPLANT
SUT VIC AB 0 CT1 27 (SUTURE)
SUT VIC AB 0 CT1 27XBRD ANBCTR (SUTURE) IMPLANT
SUT VIC AB 2-0 SH 27 (SUTURE)
SUT VIC AB 2-0 SH 27XBRD (SUTURE) IMPLANT
SUT VICRYL 2-0 SH 8X27 (SUTURE) ×3 IMPLANT
SYR BULB EAR ULCER 3OZ GRN STR (SYRINGE) ×3 IMPLANT
TOWEL OR 17X26 10 PK STRL BLUE (TOWEL DISPOSABLE) ×3 IMPLANT
TRAY FOLEY W/BAG SLVR 14FR LF (SET/KITS/TRAYS/PACK) ×3 IMPLANT

## 2022-05-13 NOTE — Progress Notes (Signed)
Dr. Wannetta Sender aware of 300 ML void after bladder backfilled and cath removed. 0 ml on bladder scan.

## 2022-05-13 NOTE — Discharge Instructions (Addendum)

## 2022-05-13 NOTE — Transfer of Care (Signed)
Immediate Anesthesia Transfer of Care Note  Patient: Shannon [name]  Procedure(s) Performed: Procedure(s) (LRB): CYSTOSCOPY (N/A) ANTERIOR (CYSTOCELE) AND POSTERIOR REPAIR (RECTOCELE), PERINEORRHAPHY  Patient Location: PACU  Anesthesia Type: General  Level of Consciousness: awake, sedated, patient cooperative and responds to stimulation  Airway & Oxygen Therapy: Patient Spontanous Breathing and Patient connected to [place] oxygen  Post-op Assessment: Report given to PACU RN, Post -op Vital signs reviewed and stable and Patient moving all extremities  Post vital signs: Reviewed and stable  Complications: No apparent anesthesia complications

## 2022-05-13 NOTE — Op Note (Signed)
Operative Note  Preoperative Diagnosis: anterior vaginal prolapse and posterior vaginal prolapse  Postoperative Diagnosis: same  Procedures performed:  Anterior and posterior repair with perineorrhaphy, cystoscopy  Implants: none  Attending Surgeon: Sherlene Shams, MD  Anesthesia: General LMA  Findings: 1. On vaginal exam, stage II prolapse noted  2. On cystoscopy, normal bladder and urethra without injury, lesion or foreign body. Brisk bilateral ureteral efflux noted.    Specimens: * No specimens in log *  Estimated blood loss: 25 mL  IV fluids: see flowsheet  Urine output: 301 mL  Complications: none  Procedure in Detail:  After informed consent was obtained, the patient was taken to the operating room where general anesthesia was induced. She was placed in dorsal lithotomy position, taking care to avoid any traction of the extremities and prepped and draped in the usual sterile fashion. A self-retaining lonestar retractor was placed using four elastic blue stays.  The foley catheter was inserted into the urethra. Good apical support of the uterus was noted. Two Allis clamps were placed in the midline of the posterior vaginal wall defect.  1% lidocaine with epinephrine was injected into the vaginal mucosa. A vertical incision was made between these clamps with a 15 blade scalpel.  The rectovaginal septum was then dissected off the vaginal mucosa bilaterally.  No enterocele was noted.  The rectovaginal septum was then reapproximated with pursestring then plicating sutures of 2-0 Vicryl.   The last distal stitch incorporated the perineal body in a U stitch fashion.  After plication, the excess vaginal mucosa was trimmed and the vaginal mucosa was reapproximated using 2-0 Vicryl sutures in a running fashion.   A small anterior vaginal wall bulge was noted. Two Allis clamps were along the anterior vaginal wall defect. 1% lidocaine with epinephrine was injected into the vaginal  mucosa.  A vertical incision was made between these two Allis clamps with a 15 blade scalpel.  Allis clamps were placed along this incision and Metzenbaum scissors were used to undermine the vaginal mucosa along the incision.  The vaginal mucosa was then sharply dissected off to the vesicovaginal septum bilaterally to the level of the pubic rami.  Anterior plication of the vesicovaginal septum was then performed using plicating sutures of 2-0 Vicryl. The vaginal mucosal edges were trimmed and the incision reapproximated with 2-0 Vicryl in a running fashion.    The Foley catheter was removed.  A 70-degree cystoscope was introduced, and 360-degree inspection revealed no injury, lesion or foreign body in the bladder.  Good bilateral ureteral efflux was noted.  The bladder was drained and the cystoscope was removed.  The Foley catheter was reinserted.  Perineorrhaphy was performed next. Two allis clamps were placed at the introitus. The perineum was injected with local anesthetic. A diamond shaped incision was made between the two clamps and the epithelium was removed. Dissection of the underlying tissue was performed with metzenbaum scissors. The perineal body was then reapproximated with three interrupted sutures of 3-0 vicryl. The perineal skin was then closed with 2-0 vicryl in subcutaneous and subcuticular fashion.  The vagina was copiously irrigated and hemostasis was noted.  Vaginal packing was not placed.  A rectal examination was normal and confirmed no sutures within the rectum.  The patient tolerated the procedure well.  She was awakened from anesthesia and transferred to the recovery room in stable condition. All counts were correct x 2.    Jaquita Folds, MD

## 2022-05-13 NOTE — Anesthesia Preprocedure Evaluation (Addendum)
Anesthesia Evaluation  Patient identified by MRN, date of birth, ID band Patient awake    Reviewed: Allergy & Precautions, NPO status , Patient's Chart, lab work & pertinent test results  History of Anesthesia Complications (+) PONV and history of anesthetic complications  Airway Mallampati: III  TM Distance: >3 FB Neck ROM: Full    Dental  (+) Dental Advisory Given   Pulmonary neg shortness of breath, asthma , neg sleep apnea, neg COPD, neg recent URI, former smoker   Pulmonary exam normal breath sounds clear to auscultation       Cardiovascular (-) hypertension(-) angina (-) Past MI, (-) Cardiac Stents and (-) CABG (-) dysrhythmias + Valvular Problems/Murmurs  Rhythm:Regular Rate:Normal  HLD   Neuro/Psych  PSYCHIATRIC DISORDERS (ADHD) Anxiety Depression    Vertigo, neurofibroma  Neuromuscular disease (lumbar DDD, sciatica)    GI/Hepatic Neg liver ROS,GERD  ,,Diverticulosis    Endo/Other  neg diabetesHypothyroidism    Renal/GU negative Renal ROS     Musculoskeletal  (+) Arthritis ,  osteopenia   Abdominal   Peds  Hematology negative hematology ROS (+)   Anesthesia Other Findings   Reproductive/Obstetrics                             Anesthesia Physical Anesthesia Plan  ASA: 3  Anesthesia Plan: General   Post-op Pain Management: Tylenol PO (pre-op)*   Induction: Intravenous  PONV Risk Score and Plan: 4 or greater and Ondansetron, Dexamethasone, Propofol infusion, TIVA and Treatment Demetric vary due to age or medical condition  Airway Management Planned: LMA  Additional Equipment:   Intra-op Plan:   Post-operative Plan: Extubation in OR  Informed Consent: I have reviewed the patients History and Physical, chart, labs and discussed the procedure including the risks, benefits and alternatives for the proposed anesthesia with the patient or authorized representative who has indicated  his/her understanding and acceptance.     Dental advisory given  Plan Discussed with: CRNA and Anesthesiologist  Anesthesia Plan Comments: (Risks of general anesthesia discussed including, but not limited to, sore throat, hoarse voice, chipped/damaged teeth, injury to vocal cords, nausea and vomiting, allergic reactions, lung infection, heart attack, stroke, and death. All questions answered. )        Anesthesia Quick Evaluation

## 2022-05-13 NOTE — Anesthesia Procedure Notes (Signed)
Procedure Name: LMA Insertion Date/Time: 05/13/2022 9:41 AM  Performed by: Justice Rocher, CRNAPre-anesthesia Checklist: Patient identified, Emergency Drugs available, Suction available, Patient being monitored and Timeout performed Patient Re-evaluated:Patient Re-evaluated prior to induction Oxygen Delivery Method: Circle system utilized Preoxygenation: Pre-oxygenation with 100% oxygen Induction Type: IV induction Ventilation: Mask ventilation without difficulty LMA: LMA inserted LMA Size: 4.0 Number of attempts: 1 Airway Equipment and Method: Bite block Placement Confirmation: positive ETCO2, breath sounds checked- equal and bilateral and CO2 detector Tube secured with: Tape Dental Injury: Teeth and Oropharynx as per pre-operative assessment

## 2022-05-13 NOTE — Telephone Encounter (Signed)
Shannon [name] underwent Anterior and posterior repair with perineorrhaphy, cystoscopy  on 05/13/22.   She passed her voiding trial.  371m was backfilled into the bladder Voided 2522m PVR by bladder scan was 74m58m  She was discharged without a catheter. Please call her for a routine post op check. Thanks!  MicJaquita FoldsD

## 2022-05-13 NOTE — Anesthesia Postprocedure Evaluation (Signed)
Anesthesia Post Note  Patient: Juliana H Mccain  Procedure(s) Performed: CYSTOSCOPY (Bladder) ANTERIOR (CYSTOCELE) AND POSTERIOR REPAIR (RECTOCELE), PERINEORRHAPHY (Vagina )     Patient location during evaluation: PACU Anesthesia Type: General Level of consciousness: awake Pain management: pain level controlled Vital Signs Assessment: post-procedure vital signs reviewed and stable Respiratory status: spontaneous breathing, nonlabored ventilation and respiratory function stable Cardiovascular status: blood pressure returned to baseline and stable Postop Assessment: no apparent nausea or vomiting Anesthetic complications: no   No notable events documented.  Last Vitals:  Vitals:   05/13/22 1130 05/13/22 1145  BP: 114/74 119/77  Pulse: 62 64  Resp: 14 17  Temp:    SpO2: 98% 96%    Last Pain:  Vitals:   05/13/22 1145  TempSrc:   PainSc: 0-No pain                 Nilda Simmer

## 2022-05-13 NOTE — Interval H&P Note (Signed)
History and Physical Interval Note:  05/13/2022 9:03 AM  Shannon [name]  has presented today for surgery, with the diagnosis of posterior vaginal prolapse; anterior vaginal prolapse; stress urinary incontinence.  The various methods of treatment have been discussed with the patient and family. After consideration of risks, benefits and other options for treatment, the patient has consented to  Procedure(s) with comments: POSTERIOR REPAIR (RECTOCELE) with perineorrhaphy (N/A)  POSSIBLE ANTERIOR  WITH SACROSPINOUS FIXATION (N/A) as a surgical intervention.  The patient's history has been reviewed, patient examined, no change in status, stable for surgery.  I have reviewed the patient's chart and labs.  Questions were answered to the patient's satisfaction.     Jaquita Folds

## 2022-05-14 ENCOUNTER — Encounter (HOSPITAL_BASED_OUTPATIENT_CLINIC_OR_DEPARTMENT_OTHER): Payer: Self-pay | Admitting: Obstetrics and Gynecology

## 2022-05-14 NOTE — Telephone Encounter (Signed)
Called and spoke to patient Minimal bleeding vaginally and reports overall feeling well.  Is having no pain in the vaginal area, denies having to take Tramadol. Is taking ibuprofen and tylenol.  Endorses headache and acid reflux, took Pepcid last night and this morning.  Has not yet had a BM. Plans to take Miralax after breakfast. Took Colon-Max last night. Is drinking plenty of water.  Has been able to urinate normally.

## 2022-05-23 ENCOUNTER — Encounter: Payer: Self-pay | Admitting: Family Medicine

## 2022-05-23 ENCOUNTER — Other Ambulatory Visit: Payer: Self-pay | Admitting: Family Medicine

## 2022-05-23 MED ORDER — DEXTROAMPHETAMINE SULFATE 10 MG PO TABS: 10.0000 mg | ORAL_TABLET | Freq: Every day | ORAL | 0 refills | Status: DC

## 2022-05-30 ENCOUNTER — Encounter: Payer: Self-pay | Admitting: Internal Medicine

## 2022-06-17 ENCOUNTER — Encounter: Payer: Self-pay | Admitting: *Deleted

## 2022-06-25 ENCOUNTER — Ambulatory Visit (INDEPENDENT_AMBULATORY_CARE_PROVIDER_SITE_OTHER): Payer: Medicare Other | Admitting: Obstetrics and Gynecology

## 2022-06-25 ENCOUNTER — Encounter: Payer: Self-pay | Admitting: Obstetrics and Gynecology

## 2022-06-25 VITALS — BP 117/72 | HR 58

## 2022-06-25 DIAGNOSIS — N393 Stress incontinence (female) (male): Secondary | ICD-10-CM

## 2022-06-25 DIAGNOSIS — K5904 Chronic idiopathic constipation: Secondary | ICD-10-CM

## 2022-06-25 DIAGNOSIS — Z1231 Encounter for screening mammogram for malignant neoplasm of breast: Secondary | ICD-10-CM | POA: Diagnosis not present

## 2022-06-25 DIAGNOSIS — Z9889 Other specified postprocedural states: Secondary | ICD-10-CM

## 2022-06-25 LAB — HM MAMMOGRAPHY

## 2022-06-25 NOTE — Patient Instructions (Signed)
Start daily fiber supplement and take miralax daily or every other day (can do a half cap if needed).

## 2022-06-25 NOTE — Progress Notes (Signed)
[facility]  Date of Visit: 06/25/2022  History of Present Illness: Shannon [name] is a 72 y.o. female scheduled today for a post-operative visit.   Surgery: s/p Anterior and posterior repair with perineorrhaphy, cystoscopy on 05/13/22  She passed her postoperative void trial.   Postoperative course has been uncomplicated.   Today she reports she has some brain fog since the surgery but it was present before the surgery. It has slowly gotten better. She has started at [facility]- has had 2 appts.   Prior urodynamic testing showed elevated PVR of 540m and SUI.   UTI in the last 6 weeks? No  Pain? No  She has returned to her normal activity (except for postop restrictions) Vaginal bulge? No  Stress incontinence: Yes , but not as much Urgency/frequency: No  Urge incontinence: No  Voiding dysfunction: No - feels a better stream  Bowel issues: Yes - was taking colon max prior to the surgery. Has taken miralax and milk of mag occasionally but has caused diarrhea. Has used metamucil in the past.   Subjective Success: Do you usually have a bulge or something falling out that you can see or feel in the vaginal area? No  Retreatment Success: Any retreatment with surgery or pessary for any compartment? No    Medications: She has a current medication list which includes the following prescription(s): acetaminophen, proair respiclick, amphetamine-dextroamphetamine, cholecalciferol, dextroamphetamine, escitalopram, gamma-aminobutyric acid, hydrocortisone, ibuprofen, krill oil, acidophilus lactobacillus, lorazepam, magnesium, nutritional supplements, nystatin ointment, polyethylene glycol powder, probiotic product, tirosint, and tretinoin.   Allergies: Patient is allergic to hydrocodone-acetaminophen, gluten meal, and oxycodone.   Physical Exam: BP 117/72   Pulse (!) 58   LMP 03/29/2004    Pelvic Examination: Vagina: Incisions healing well. Sutures are not present at incision line  and there is not granulation tissue. No tenderness along the anterior or posterior vagina. No apical tenderness. No pelvic masses. Normal cervix.  POP-Q: POP-Q  -3                                            Aa   -3                                           Ba  -7                                              C   2                                            Gh  4                                            Pb  8.5  tvl   -3                                            Ap  -3                                            Bp  -8.5                                              D     ---------------------------------------------------------  Assessment and Plan:  1. Post-operative state   2. SUI (stress urinary incontinence, female)   3. Chronic idiopathic constipation    - For constipation, start fiber supplement daily and take miralax consistently (every day or every other, either full or half cap)- can titrate as needed.  - Continue with pelvic PT. If she is still having issues with leakage, then will return for further evaluation. She Prima be interested in urethral bulking. Will need urodynamic testing first due to her history of incomplete emptying.  - Can resume regular activity including exercise and intercourse,  if desired.  - Discussed avoidance of heavy lifting and straining long term to reduce the risk of recurrence.   Return as needed  Jaquita Folds, MD

## 2022-06-27 ENCOUNTER — Encounter: Payer: Self-pay | Admitting: Family Medicine

## 2022-06-27 ENCOUNTER — Other Ambulatory Visit: Payer: Self-pay | Admitting: Family Medicine

## 2022-06-27 MED ORDER — DEXTROAMPHETAMINE SULFATE 10 MG PO TABS: 10.0000 mg | ORAL_TABLET | Freq: Every day | ORAL | 0 refills | Status: DC

## 2022-07-17 DIAGNOSIS — H25813 Combined forms of age-related cataract, bilateral: Secondary | ICD-10-CM | POA: Diagnosis not present

## 2022-07-17 DIAGNOSIS — H524 Presbyopia: Secondary | ICD-10-CM | POA: Diagnosis not present

## 2022-07-17 DIAGNOSIS — H02836 Dermatochalasis of left eye, unspecified eyelid: Secondary | ICD-10-CM | POA: Diagnosis not present

## 2022-07-17 DIAGNOSIS — H43813 Vitreous degeneration, bilateral: Secondary | ICD-10-CM | POA: Diagnosis not present

## 2022-07-17 DIAGNOSIS — H52203 Unspecified astigmatism, bilateral: Secondary | ICD-10-CM | POA: Diagnosis not present

## 2022-07-17 DIAGNOSIS — H5213 Myopia, bilateral: Secondary | ICD-10-CM | POA: Diagnosis not present

## 2022-07-17 DIAGNOSIS — H02833 Dermatochalasis of right eye, unspecified eyelid: Secondary | ICD-10-CM | POA: Diagnosis not present

## 2022-07-27 ENCOUNTER — Encounter: Payer: Self-pay | Admitting: Family Medicine

## 2022-07-29 ENCOUNTER — Other Ambulatory Visit: Payer: Self-pay | Admitting: Family Medicine

## 2022-07-29 MED ORDER — DEXTROAMPHETAMINE SULFATE 10 MG PO TABS: 10.0000 mg | ORAL_TABLET | Freq: Every day | ORAL | 0 refills | Status: DC

## 2022-07-29 NOTE — Telephone Encounter (Signed)
Requesting: dextrostat 10mg   Contract: 03/02/2018 UDS: 08/23/21 Last Visit: 03/05/22 Next Visit: 09/02/22 Last Refill: 06/27/22 #30 and 0RF   Please Advise

## 2022-07-31 ENCOUNTER — Ambulatory Visit: Payer: Medicare Other

## 2022-08-01 ENCOUNTER — Ambulatory Visit (INDEPENDENT_AMBULATORY_CARE_PROVIDER_SITE_OTHER): Payer: Medicare Other | Admitting: *Deleted

## 2022-08-01 ENCOUNTER — Encounter: Payer: Self-pay | Admitting: Family Medicine

## 2022-08-01 VITALS — BP 112/70 | HR 79 | Ht 65.0 in | Wt 170.0 lb

## 2022-08-01 DIAGNOSIS — Z0000 Encounter for general adult medical examination without abnormal findings: Secondary | ICD-10-CM

## 2022-08-01 NOTE — Progress Notes (Signed)
Subjective:  Pt completed ADLs, Fall risk, and SDOH during e-check in on 07/31/22.  Answers verified with pt.    Shannon [name] is a 72 y.o. female who presents for Medicare Annual (Subsequent) preventive examination.  Review of Systems     Cardiac Risk Factors include: advanced age (>47men, >50 women);dyslipidemia     Objective:    Today's Vitals   08/01/22 1425  BP: 112/70  Pulse: 79  Weight: 170 lb (77.1 kg)  Height: 5\' 5"  (1.651 m)   Body mass index is 28.29 kg/m.     08/01/2022    2:19 PM 05/13/2022    8:04 AM 07/19/2021    3:18 PM 07/13/2020    3:32 PM 03/30/2019    3:16 PM 03/12/2018    2:38 PM 02/24/2017   10:39 AM  Advanced Directives  Does Patient Have a Medical Advance Directive? Yes Yes Yes Yes Yes Yes Yes  Type of Advance Directive Living will;Healthcare Power of [place];Living will [place];Living will;Out of facility DNR (pink MOST or yellow form) [place];Living will [place];Living will [place];Living will [place];Living will  Does patient want to make changes to medical advance directive? No - Patient declined No - Patient declined No - Patient declined  No - Patient declined  No - Patient declined  Copy of [place] in Chart? Yes - validated most recent copy scanned in chart (See row information)  Yes - validated most recent copy scanned in chart (See row information) Yes - validated most recent copy scanned in chart (See row information) Yes - validated most recent copy scanned in chart (See row information) Yes - validated most recent copy scanned in chart (See row information) No - copy requested    Current Medications (verified) Outpatient Encounter Medications as of 08/01/2022  Medication Sig   Albuterol Sulfate (PROAIR RESPICLICK) 123XX123 (90 Base) MCG/ACT AEPB Inhale 2 puffs every 4-6 hours as needed for cough and  shortness of breath.   Cholecalciferol 25 MCG (1000 UT) tablet Take by mouth.   dextroamphetamine (DEXTROSTAT) 10 MG tablet Take 1 tablet (10 mg total) by mouth daily.   escitalopram (LEXAPRO) 10 MG tablet Take 1 tablet (10 mg total) by mouth daily.   GAMMA AMINOBUTYRIC ACID PO Take 200 mg by mouth as needed. Up to bid   hydrocortisone (ANUSOL-HC) 2.5 % rectal cream Place 1 Application rectally 2 (two) times daily.   Krill Oil 1000 MG CAPS    Lactobacillus Acidophilus (ACIDOPHILUS LACTOBACILLUS) 10 BIL UNT/GM POWD Take 1 capsule by mouth daily.   LORazepam (ATIVAN) 2 MG tablet Take 1 tablet (2 mg total) by mouth every 8 (eight) hours as needed for anxiety.   MAGNESIUM GLYCINATE PLUS PO Take 100 mg by mouth 2 (two) times daily.   Nutritional Supplements (PEPTINEX DT/PREBIOTICS PO) Take by mouth.   nystatin ointment (MYCOSTATIN)    polyethylene glycol powder (GLYCOLAX/MIRALAX) 17 GM/SCOOP powder Take 17 g by mouth daily. Drink 17g (1 scoop) dissolved in water per day.   TIROSINT 88 MCG CAPS TAKE ONE Capsule BY MOUTH EVERY DAY BEFORE BREAKFAST   tretinoin (RETIN-A) 0.025 % cream Apply 0.025 % topically daily. Pea size amount   [DISCONTINUED] acetaminophen (TYLENOL) 500 MG tablet Take 1 tablet (500 mg total) by mouth every 6 (six) hours as needed (pain).   [DISCONTINUED] amphetamine-dextroamphetamine (ADDERALL XR) 10 MG 24 hr capsule Take by mouth.   [  DISCONTINUED] dextroamphetamine (DEXTROSTAT) 10 MG tablet Take 1 tablet (10 mg total) by mouth daily.   No facility-administered encounter medications on file as of 08/01/2022.    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 Left torn meniscus Right arthritis   Lead exposure 03/02/2018   Lumbar degenerative disc disease 02/24/2017   MRI 2019 confirms multilevel change  Sciatica at times 2/2 this and periodic radiculopathy   Major depressive disorder 03/08/2013   Melanocytic nevi of trunk    Menopause    Neck pain on left side 09/10/2018   Suspect this is DDD and DJD - XR is pending Neurapraxic spasm to left trapezius   Neurofibroma 07/20/2019   Nuclear sclerotic cataract of both eyes 07/23/2012   OAB (overactive bladder) 08/05/2016   Osteopenia    PONV (postoperative nausea and vomiting)    Rosacea    Sciatica of left side 02/24/2017   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   By exam she has chronic tendinopathy of Glut Med   Urinary urgency 09/17/2020   Vertigo 04/12/2019   Vitamin D deficiency 02/28/2014   Past Surgical History:  Procedure Laterality Date   ANTERIOR AND POSTERIOR REPAIR  05/13/2022   Procedure: ANTERIOR (CYSTOCELE) AND POSTERIOR REPAIR (RECTOCELE), PERINEORRHAPHY;  Surgeon: Jaquita Folds, MD;  Location: [facility];  Service: Gynecology;;   blephoroplasty     BROW LIFT     CYSTOSCOPY N/A 05/13/2022   Procedure: CYSTOSCOPY;  Surgeon: Jaquita Folds, MD;  [facility];  Service: Gynecology;  Laterality: N/A;   DILATION AND CURETTAGE OF UTERUS     SKIN CANCER EXCISION     right lower lip   TONSILLECTOMY AND ADENOIDECTOMY     Family History  Problem Relation Age of Onset   Colon polyps Mother    Parkinson's disease Mother        developed Parkinson's disease dementia   Diabetes Father    Emphysema Father    Tuberculosis Father    Alcohol abuse Sister    Cancer Sister        skin cancer,  melanoma cell, basil cell   Cancer Sister        melanoma   Anxiety disorder Brother    Parkinson's disease Maternal Grandmother        developed Parkinson's disease dementia   Colon cancer Maternal Grandfather    ADD / ADHD Daughter    Anxiety disorder Daughter    Depression Daughter    ADD / ADHD Son    Anxiety disorder Son    Depression Son    Social History   Socioeconomic History   Marital status: Single    Spouse name: Not on file   Number of children: 2   Years of education: 18   Highest education level: Master's degree (e.g., MA, MS, MEng, MEd, MSW, MBA)  Occupational History  Occupation: Retired    Comment: Early Administrator, arts  Tobacco Use   Smoking status: Former    Types: Cigarettes    Quit date: 08/20/1970    Years since quitting: 51.9   Smokeless tobacco: Never  Vaping Use   Vaping Use: Never used  Substance and Sexual Activity   Alcohol use: Yes    Alcohol/week: 3.0 - 4.0 standard drinks of alcohol    Types: 1 - 2 Glasses of wine, 1 Cans of beer, 1 Shots of liquor per week    Comment: not every night.   Drug use: No   Sexual activity: Not Currently  Other Topics Concern   Not on file  Social History Narrative   Retired from [employer].   No major dietary restrictions except avoid gluten.       Social Determinants of Health   Financial Resource Strain: Low Risk  (07/19/2021)   Overall Financial Resource Strain (CARDIA)    Difficulty of Paying Living Expenses: Not hard at all  Food Insecurity: No Food Insecurity (07/31/2022)   Hunger Vital Sign    Worried About Running Out of Food in the Last Year: Never true    Ran Out of Food in the Last Year: Never true  Transportation Needs: No Transportation Needs (07/31/2022)   PRAPARE - Hydrologist (Medical): No    Lack of Transportation (Non-Medical): No  Physical Activity: Insufficiently Active (07/31/2022)   Exercise Vital Sign    Days of Exercise per Week: 4 days     Minutes of Exercise per Session: 30 min  Stress: No Stress Concern Present (07/31/2022)   [place]    Feeling of Stress : Only a little  Social Connections: Unknown (07/31/2022)   Social Connection and Isolation Panel [NHANES]    Frequency of Communication with Friends and Family: More than three times a week    Frequency of Social Gatherings with Friends and Family: Twice a week    Attends Religious Services: Not on Advertising copywriter or Organizations: Yes    Attends Music therapist: More than 4 times per year    Marital Status: Divorced    Tobacco Counseling Counseling given: Not Answered   Clinical Intake:  Pre-visit preparation completed: Yes  Pain : No/denies pain     BMI - recorded: 28.29 Nutritional Status: BMI 25 -29 Overweight Nutritional Risks: None Diabetes: No  How often do you need to have someone help you when you read instructions, pamphlets, or other written materials from your doctor or pharmacy?: 1 - Never  Interpreter Needed?: No  Information entered by :: Beatris Ship, [place]   Activities of Daily Living    07/31/2022    2:43 PM 05/13/2022    8:11 AM  In your present state of health, do you have any difficulty performing the following activities:  Hearing? 0 0  Vision? 0 0  Difficulty concentrating or making decisions? 0 0  Walking or climbing stairs? 0 0  Dressing or bathing? 0 0  Doing errands, shopping? 0   Preparing Food and eating ? N   Using the Toilet? N   In the past six months, have you accidently leaked urine? Y   Do you have problems with loss of bowel control? N   Managing your Medications? N   Managing your Finances? N   Housekeeping or managing your Housekeeping? N  Patient Care Team: Mosie Lukes, MD as PCP - General (Family Medicine)  Indicate any recent Medical Services you Lotoya have received from other than [facility] providers in the  past year (date Jaena be approximate).     Assessment:   This is a routine wellness examination for Desa.  Hearing/Vision screen No results found.  Dietary issues and exercise activities discussed: Current Exercise Habits: Home exercise routine, Type of exercise: Other - see comments;strength training/weights (Peloton), Time (Minutes): 60, Frequency (Times/Week): 4, Weekly Exercise (Minutes/Week): 240, Intensity: Mild, Exercise limited by: None identified   Goals Addressed   None    Depression Screen    08/01/2022    2:30 PM 03/05/2022    2:36 PM 08/23/2021    1:35 PM 07/19/2021    3:28 PM 09/14/2020   11:25 AM 07/13/2020    3:36 PM 04/11/2020    9:02 AM  PHQ 2/9 Scores  PHQ - 2 Score 0 0 0 0 0 0 0  PHQ- 9 Score  0 0        Fall Risk    07/31/2022    2:43 PM 03/05/2022    2:36 PM 08/23/2021    1:35 PM 07/19/2021    3:25 PM 09/14/2020   11:26 AM  Fall Risk   Falls in the past year? 1 0 1 1 1   Number falls in past yr: 1 0 0 0 0  Injury with Fall? 0 0 1 1 0  Risk for fall due to : History of fall(s)  History of fall(s) History of fall(s)   Follow up Falls evaluation completed Falls evaluation completed Falls evaluation completed Falls evaluation completed     [place]:  Any stairs in or around the home? Yes  If so, are there any without handrails? No  Home free of loose throw rugs in walkways, pet beds, electrical cords, etc? Yes  Adequate lighting in your home to reduce risk of falls? Yes   ASSISTIVE DEVICES UTILIZED TO PREVENT FALLS:  Life alert? No  Use of a cane, walker or w/c? No  Grab bars in the bathroom? No  Shower chair or bench in shower? Yes  Elevated toilet seat or a handicapped toilet?  Comfort height  TIMED UP AND GO:  Was the test performed? Yes .  Length of time to ambulate 10 feet: 5 sec.   Gait steady and fast without use of assistive device  Cognitive Function:        08/01/2022    2:39 PM 07/19/2021    3:41 PM  07/13/2020    3:50 PM  6CIT Screen  What Year? 0 points 0 points 0 points  What month? 0 points 0 points 0 points  What time? 0 points 0 points 0 points  Count back from 20 0 points 0 points 0 points  Months in reverse 0 points 0 points 0 points  Repeat phrase 0 points 0 points 0 points  Total Score 0 points 0 points 0 points    Immunizations Immunization History  Administered Date(s) Administered   COVID-19, mRNA, vaccine(Comirnaty)12 years and older 02/14/2022   Fluad Quad(high Dose 65+) 01/10/2022   Influenza Split 03/08/2013   Influenza, High Dose Seasonal PF 01/14/2019   Influenza,inj,Quad PF,6+ Mos 02/01/2020   Influenza-Unspecified 03/07/2014, 03/10/2018, 01/22/2021   Moderna Covid-19 Vaccine Bivalent Booster 69yrs & up 02/15/2022   Moderna SARS-COV2 Booster Vaccination 07/20/2021   PFIZER(Purple Top)SARS-COV-2 Vaccination 05/19/2019, 06/07/2019, 02/15/2020, 10/31/2020   PPD  Test 03/08/2013, 02/28/2014, 03/13/2015   Pneumococcal Conjugate-13 03/11/2016   Pneumococcal Polysaccharide-23 03/02/2018   Pneumococcal-Unspecified 04/08/2011   Rsv, Bivalent, Protein Subunit Rsvpref,pf Evans Lance) 03/11/2022   Tdap 09/05/2009, 04/11/2020   Zoster Recombinat (Shingrix) 03/18/2018, 05/26/2018   Zoster, Live 02/28/2014    TDAP status: Up to date  Flu Vaccine status: Up to date  Pneumococcal vaccine status: Up to date  Covid-19 vaccine status: Information provided on how to obtain vaccines.   Qualifies for Shingles Vaccine? Yes   Zostavax completed Yes   Shingrix Completed?: Yes  Screening Tests Health Maintenance  Topic Date Due   COVID-19 Vaccine (6 - 2023-24 season) 04/12/2022   Medicare Annual Wellness (AWV)  07/20/2022   INFLUENZA VACCINE  11/28/2022   MAMMOGRAM  06/25/2024   COLONOSCOPY (Pts 45-34yrs Insurance coverage will need to be confirmed)  03/07/2026   DTaP/Tdap/Td (3 - Td or Tdap) 04/11/2030   Pneumonia Vaccine 34+ Years old  Completed   DEXA SCAN   Completed   Hepatitis C Screening  Completed   Zoster Vaccines- Shingrix  Completed   HPV VACCINES  Aged Out    Health Maintenance  Health Maintenance Due  Topic Date Due   COVID-19 Vaccine (6 - 2023-24 season) 04/12/2022   Medicare Annual Wellness (AWV)  07/20/2022    Colorectal cancer screening: Type of screening: Colonoscopy. Completed 03/07/16. Repeat every 10 years  Mammogram status: Completed 06/25/22. Repeat every year  Bone Density status: Completed 07/26/21. Results reflect: Bone density results: OSTEOPENIA. Repeat every 2 years.  Lung Cancer Screening: (Low Dose CT Chest recommended if Age 53-80 years, 30 pack-year currently smoking OR have quit w/in 15years.) does not qualify.   Additional Screening:  Hepatitis C Screening: does qualify; Completed 03/08/13  Vision Screening: Recommended annual ophthalmology exams for early detection of glaucoma and other disorders of the eye. Is the patient up to date with their annual eye exam?  Yes  Who is the provider or what is the name of the office in which the patient attends annual eye exams? Dr. Diona Foley If pt is not established with a provider, would they like to be referred to a provider to establish care? No .   Dental Screening: Recommended annual dental exams for proper oral hygiene  Community Resource Referral / Chronic Care Management: CRR required this visit?  No   CCM required this visit?  No      Plan:     I have personally reviewed and noted the following in the patient's chart:   Medical and social history Use of alcohol, tobacco or illicit drugs  Current medications and supplements including opioid prescriptions. Patient is not currently taking opioid prescriptions. Functional ability and status Nutritional status Physical activity Advanced directives List of other physicians Hospitalizations, surgeries, and ER visits in previous 12 months Vitals Screenings to include cognitive, depression, and  falls Referrals and appointments  In addition, I have reviewed and discussed with patient certain preventive protocols, quality metrics, and best practice recommendations. A written personalized care plan for preventive services as well as general preventive health recommendations were provided to patient.     Beatris Ship, [place]   08/01/2022   Nurse Notes: None

## 2022-08-01 NOTE — Patient Instructions (Signed)
Ms. Stophel , Thank you for taking time to come for your Medicare Wellness Visit. I appreciate your ongoing commitment to your health goals. Please review the following plan we discussed and let me know if I can assist you in the future.     This is a list of the screening recommended for you and due dates:  Health Maintenance  Topic Date Due   COVID-19 Vaccine (6 - 2023-24 season) 04/12/2022   Flu Shot  11/28/2022   Medicare Annual Wellness Visit  08/01/2023   Mammogram  06/25/2024   Colon Cancer Screening  03/07/2026   DTaP/Tdap/Td vaccine (3 - Td or Tdap) 04/11/2030   Pneumonia Vaccine  Completed   DEXA scan (bone density measurement)  Completed   Hepatitis C Screening: USPSTF Recommendation to screen - Ages 63-79 yo.  Completed   Zoster (Shingles) Vaccine  Completed   HPV Vaccine  Aged Out    Next appointment: Follow up in one year for your annual wellness visit.   Preventive Care 35 Years and Older, Female Preventive care refers to lifestyle choices and visits with your health care provider that can promote health and wellness. What does preventive care include? A yearly physical exam. This is also called an annual well check. Dental exams once or twice a year. Routine eye exams. Ask your health care provider how often you should have your eyes checked. Personal lifestyle choices, including: Daily care of your teeth and gums. Regular physical activity. Eating a healthy diet. Avoiding tobacco and drug use. Limiting alcohol use. Practicing safe sex. Taking low-dose aspirin every day. Taking vitamin and mineral supplements as recommended by your health care provider. What happens during an annual well check? The services and screenings done by your health care provider during your annual well check will depend on your age, overall health, lifestyle risk factors, and family history of disease. Counseling  Your health care provider Maridel ask you questions about your: Alcohol  use. Tobacco use. Drug use. Emotional well-being. Home and relationship well-being. Sexual activity. Eating habits. History of falls. Memory and ability to understand (cognition). Work and work Statistician. Reproductive health. Screening  You Deshay have the following tests or measurements: Height, weight, and BMI. Blood pressure. Lipid and cholesterol levels. These Allien be checked every 5 years, or more frequently if you are over 3 years old. Skin check. Lung cancer screening. You Emilee have this screening every year starting at age 60 if you have a 30-pack-year history of smoking and currently smoke or have quit within the past 15 years. Fecal occult blood test (FOBT) of the stool. You Shermika have this test every year starting at age 52. Flexible sigmoidoscopy or colonoscopy. You Cyriah have a sigmoidoscopy every 5 years or a colonoscopy every 10 years starting at age 38. Hepatitis C blood test. Hepatitis B blood test. Sexually transmitted disease (STD) testing. Diabetes screening. This is done by checking your blood sugar (glucose) after you have not eaten for a while (fasting). You Latroya have this done every 1-3 years. Bone density scan. This is done to screen for osteoporosis. You Jeannette have this done starting at age 27. Mammogram. This Johnell be done every 1-2 years. Talk to your health care provider about how often you should have regular mammograms. Talk with your health care provider about your test results, treatment options, and if necessary, the need for more tests. Vaccines  Your health care provider Breeana recommend certain vaccines, such as: Influenza vaccine. This is recommended every year.  Tetanus, diphtheria, and acellular pertussis (Tdap, Td) vaccine. You Juanita need a Td booster every 10 years. Zoster vaccine. You Taina need this after age 103. Pneumococcal 13-valent conjugate (PCV13) vaccine. One dose is recommended after age 41. Pneumococcal polysaccharide (PPSV23) vaccine. One dose is  recommended after age 43. Talk to your health care provider about which screenings and vaccines you need and how often you need them. This information is not intended to replace advice given to you by your health care provider. Make sure you discuss any questions you have with your health care provider. Document Released: 05/12/2015 Document Revised: 01/03/2016 Document Reviewed: 02/14/2015 Elsevier Interactive Patient Education  2017 [place] Prevention in the Home Falls can cause injuries. They can happen to people of all ages. There are many things you can do to make your home safe and to help prevent falls. What can I do on the outside of my home? Regularly fix the edges of walkways and driveways and fix any cracks. Remove anything that might make you trip as you walk through a door, such as a raised step or threshold. Trim any bushes or trees on the path to your home. Use bright outdoor lighting. Clear any walking paths of anything that might make someone trip, such as rocks or tools. Regularly check to see if handrails are loose or broken. Make sure that both sides of any steps have handrails. Any raised decks and porches should have guardrails on the edges. Have any leaves, snow, or ice cleared regularly. Use sand or salt on walking paths during winter. Clean up any spills in your garage right away. This includes oil or grease spills. What can I do in the bathroom? Use night lights. Install grab bars by the toilet and in the tub and shower. Do not use towel bars as grab bars. Use non-skid mats or decals in the tub or shower. If you need to sit down in the shower, use a plastic, non-slip stool. Keep the floor dry. Clean up any water that spills on the floor as soon as it happens. Remove soap buildup in the tub or shower regularly. Attach bath mats securely with double-sided non-slip rug tape. Do not have throw rugs and other things on the floor that can make you  trip. What can I do in the bedroom? Use night lights. Make sure that you have a light by your bed that is easy to reach. Do not use any sheets or blankets that are too big for your bed. They should not hang down onto the floor. Have a firm chair that has side arms. You can use this for support while you get dressed. Do not have throw rugs and other things on the floor that can make you trip. What can I do in the kitchen? Clean up any spills right away. Avoid walking on wet floors. Keep items that you use a lot in easy-to-reach places. If you need to reach something above you, use a strong step stool that has a grab bar. Keep electrical cords out of the way. Do not use floor polish or wax that makes floors slippery. If you must use wax, use non-skid floor wax. Do not have throw rugs and other things on the floor that can make you trip. What can I do with my stairs? Do not leave any items on the stairs. Make sure that there are handrails on both sides of the stairs and use them. Fix handrails that are broken or loose.  Make sure that handrails are as long as the stairways. Check any carpeting to make sure that it is firmly attached to the stairs. Fix any carpet that is loose or worn. Avoid having throw rugs at the top or bottom of the stairs. If you do have throw rugs, attach them to the floor with carpet tape. Make sure that you have a light switch at the top of the stairs and the bottom of the stairs. If you do not have them, ask someone to add them for you. What else can I do to help prevent falls? Wear shoes that: Do not have high heels. Have rubber bottoms. Are comfortable and fit you well. Are closed at the toe. Do not wear sandals. If you use a stepladder: Make sure that it is fully opened. Do not climb a closed stepladder. Make sure that both sides of the stepladder are locked into place. Ask someone to hold it for you, if possible. Clearly mark and make sure that you can  see: Any grab bars or handrails. First and last steps. Where the edge of each step is. Use tools that help you move around (mobility aids) if they are needed. These include: Canes. Walkers. Scooters. Crutches. Turn on the lights when you go into a dark area. Replace any light bulbs as soon as they burn out. Set up your furniture so you have a clear path. Avoid moving your furniture around. If any of your floors are uneven, fix them. If there are any pets around you, be aware of where they are. Review your medicines with your doctor. Some medicines can make you feel dizzy. This can increase your chance of falling. Ask your doctor what other things that you can do to help prevent falls. This information is not intended to replace advice given to you by your health care provider. Make sure you discuss any questions you have with your health care provider. Document Released: 02/09/2009 Document Revised: 09/21/2015 Document Reviewed: 05/20/2014 Elsevier Interactive Patient Education  2017 [employer].

## 2022-08-07 ENCOUNTER — Other Ambulatory Visit: Payer: Self-pay | Admitting: Family Medicine

## 2022-08-11 ENCOUNTER — Encounter: Payer: Self-pay | Admitting: Obstetrics and Gynecology

## 2022-08-12 DIAGNOSIS — M5441 Lumbago with sciatica, right side: Secondary | ICD-10-CM | POA: Diagnosis not present

## 2022-08-12 DIAGNOSIS — M542 Cervicalgia: Secondary | ICD-10-CM | POA: Diagnosis not present

## 2022-08-20 ENCOUNTER — Ambulatory Visit (INDEPENDENT_AMBULATORY_CARE_PROVIDER_SITE_OTHER): Payer: Medicare Other | Admitting: Internal Medicine

## 2022-08-20 ENCOUNTER — Encounter: Payer: Self-pay | Admitting: Internal Medicine

## 2022-08-20 VITALS — BP 120/80 | HR 59 | Ht 65.0 in | Wt 169.0 lb

## 2022-08-20 DIAGNOSIS — E063 Autoimmune thyroiditis: Secondary | ICD-10-CM | POA: Diagnosis not present

## 2022-08-20 DIAGNOSIS — E559 Vitamin D deficiency, unspecified: Secondary | ICD-10-CM

## 2022-08-20 LAB — VITAMIN D 25 HYDROXY (VIT D DEFICIENCY, FRACTURES): VITD: 68.03 ng/mL (ref 30.00–100.00)

## 2022-08-20 LAB — TSH: TSH: 0.45 u[IU]/mL (ref 0.35–5.50)

## 2022-08-20 NOTE — Progress Notes (Unsigned)
Name: Shannon [name]  MRN/ DOB: [id number], [birth year]    Age/ Sex: 72 y.o., female    PCP: Bradd Canary, MD   Reason for Endocrinology Evaluation: Hypothyroid     Date of Initial Endocrinology Evaluation: 02/12/2021    HPI: Shannon [name] is a 72 y.o. female with a past medical history of Hypothyroidism, gluten intolerance,Asthma , ADHD. The patient presented for initial endocrinology clinic visit on 08/20/2022 for consultative assistance with her hypothyroid.   She has been diagnosed with hypothyroidism in 2005.   She was following with an Higher education careers adviser in [place] . She is on Tirosint 100 mcg daily at night AND Desiccated thyroid 7.5 mg daily, has been on this regimen for ~ 10 yrs   She has gluten intolerance but no celiac disease diagnosis  Historically her thyroid replacement therapy has been adjusted based on her symptoms, she had noted during the summer that she has depression and leg cramps as well as occasional sleeping issues and would decrease thyroid hormone replacement therapy during the summer season to half a tablet once a week and 1 tablet the rest of the week.  In the fall she tends to go up to taking 1 tablet daily and has noted improvement in her symptoms.  She is on Vitamin D replacement . Takes 4000 iu daily in the winter and 2000 iu daily in the summer   On her initial visit we stopped compound thyroid hormone 7.5 mcg and continued Tirosint 100 mcg with a TSH 0.09 uIU/mL    Anti-TPO Abs elevated 565.6 01/2022   No Fh of thyroid disease   Father with T1DM   SUBJECTIVE:    Today (08/20/22):  Shannon [name] is here for a follow up on hypothyroidism   She is s/p Perineorrhaphy, cystoscopy 05/13/2022 Weight stable  Denies palpitations Denies local neck swelling  Denies tremors  Anxiety is stable  Has chronic constipation , uses OTC remedies , she attributes constipation due to reducing LT-4 replacement therapy Nephew dx with metastatic colon cancer  at age 23 . Last colonoscopy 82 , due at age 49 Has chronic muscle cramps    She is on Vitamin D 3000 iu daily Tirosint 88 mcg , daily     HISTORY:  Past Medical History:  Past Medical History:  Diagnosis Date   ADHD (attention deficit hyperactivity disorder), 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 Left torn meniscus Right arthritis   Lead exposure 03/02/2018   Lumbar degenerative disc disease 02/24/2017   MRI 2019 confirms multilevel change  Sciatica at times 2/2 this and periodic radiculopathy   Major depressive disorder 03/08/2013   Melanocytic nevi of trunk    Menopause    Neck pain on left side 09/10/2018   Suspect this is DDD and DJD - XR is pending Neurapraxic spasm to left trapezius   Neurofibroma 07/20/2019   Nuclear sclerotic cataract of both eyes 07/23/2012   OAB (overactive bladder) 08/05/2016   Osteopenia    PONV (postoperative  nausea and vomiting)    Rosacea    Sciatica of left side 02/24/2017   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   By exam she has chronic tendinopathy of Glut Med   Urinary urgency 09/17/2020   Vertigo 04/12/2019   Vitamin D deficiency 02/28/2014   Past Surgical History:  Past Surgical History:  Procedure Laterality Date   ANTERIOR AND POSTERIOR REPAIR  05/13/2022   Procedure: ANTERIOR (CYSTOCELE) AND POSTERIOR REPAIR (RECTOCELE), PERINEORRHAPHY;  Surgeon: Marguerita Beards, MD;  Location: [facility] [place];  Service: Gynecology;;   blephoroplasty     BROW LIFT     CYSTOSCOPY N/A 05/13/2022   Procedure:  CYSTOSCOPY;  Surgeon: Marguerita Beards, MD;  Location: [facility];  Service: Gynecology;  Laterality: N/A;   DILATION AND CURETTAGE OF UTERUS     SKIN CANCER EXCISION     right lower lip   TONSILLECTOMY AND ADENOIDECTOMY      Social History:  reports that she quit smoking about 52 years ago. Her smoking use included cigarettes. She has never used smokeless tobacco. She reports current alcohol use of about 3.0 - 4.0 standard drinks of alcohol per week. She reports that she does not use drugs. Family History: family history includes ADD / ADHD in her daughter and son; Alcohol abuse in her sister; Anxiety disorder in her brother, daughter, and son; Cancer in her sister and sister; Colon cancer in her maternal grandfather; Colon polyps in her mother; Depression in her daughter and son; Diabetes in her father; Emphysema in her father; Parkinson's disease in her maternal grandmother and mother; Tuberculosis in her father.   HOME MEDICATIONS: Allergies as of 08/20/2022       Reactions   Gluten Meal Other (See Comments)   GI symptoms; has BM everytime she urinates GI symptoms; has BM everytime she urinates        Medication List        Accurate as of August 20, 2022  3:13 PM. If you have any questions, ask your nurse or doctor.          Acidophilus Lactobacillus 10 BIL UNT/GM Powd Take 1 capsule by mouth daily.   Cholecalciferol 25 MCG (1000 UT) tablet Take by mouth.   dextroamphetamine 10 MG tablet Commonly known as: DEXTROSTAT Take 1 tablet (10 mg total) by mouth daily.   escitalopram 10 MG tablet Commonly known as: LEXAPRO Take 1 tablet (10 mg total) by mouth daily.   GAMMA AMINOBUTYRIC ACID PO Take 200 mg by mouth as needed. Up to bid   hydrocortisone 2.5 % rectal cream Commonly known as: ANUSOL-HC Place 1 Application rectally 2 (two) times daily.   Krill Oil 1000 MG Caps   LORazepam 2 MG tablet Commonly known as: ATIVAN Take 1 tablet (2 mg  total) by mouth every 8 (eight) hours as needed for anxiety.   MAGNESIUM GLYCINATE PLUS PO Take 100 mg by mouth 2 (two) times daily.   nystatin ointment Commonly known as: MYCOSTATIN   PEPTINEX DT/PREBIOTICS PO Take by mouth.   polyethylene glycol powder 17 GM/SCOOP powder Commonly known as: GLYCOLAX/MIRALAX Take 17 g by mouth daily. Drink 17g (1 scoop) dissolved in water per day.   ProAir RespiClick 108 (90 Base) MCG/ACT Aepb Generic drug: Albuterol Sulfate Inhale 2 puffs every 4-6 hours as needed for cough and shortness of breath.   Tirosint 88 MCG Caps Generic drug: Levothyroxine Sodium TAKE ONE Capsule BY MOUTH EVERY DAY BEFORE BREAKFAST   tretinoin  0.025 % cream Commonly known as: RETIN-A Apply 0.025 % topically daily. Pea size amount          REVIEW OF SYSTEMS: A comprehensive ROS was conducted with the patient and is negative except as per HPI     OBJECTIVE:  VS: BP 120/80 (BP Location: Left Arm, Patient Position: Sitting, Cuff Size: Small)   Pulse (!) 59   Ht  (1.651 m)   Wt 169 lb (76.7 kg)   LMP 03/29/2004   SpO2 99%   BMI 28.12 kg/m    Wt Readings from Last 3 Encounters:  08/20/22 169 lb (76.7 kg)  08/01/22 170 lb (77.1 kg)  05/13/22 169 lb 6.4 oz (76.8 kg)     EXAM: General: Pt appears well and is in NAD  Neck: General: Supple without adenopathy. Thyroid: Thyroid size normal.  No goiter or nodules appreciated. No thyroid bruit.  Lungs: Clear with good BS bilat   Heart: Auscultation: RRR.  Abdomen: soft, nontender  Extremities:  BL LE: No pretibial edema normal ROM and strength.  Mental Status: Judgment, insight: Intact Orientation: Oriented to time, place, and person Mood and affect: No depression, anxiety, or agitation     DATA REVIEWED:  Latest Reference Range & Units 08/20/22 13:46  VITD 30.00 - 100.00 ng/mL 68.03  TSH 0.35 - 5.50 uIU/mL 0.45      01/30/2022 ATI-TPO Ab s 565.6  (reference  <100)   ASSESSMENT/PLAN/RECOMMENDATIONS:   Hashimoto's Thyroiditis :  -TSH at the lower end of normal, she is asymptomatic at this time, but if she develops insomnia or worsening anxiety we will consider decreasing Tirosint to 75 mcg    Medications : Continue Tirosint 88 mcg ,  2. Hx vitamin D deficiency:  -Vitamin D continues to be within the normal range -She alternates vitamin dose during summer and winter months -Currently  is on 3000 IU daily, she will be reducing it to 2000 during the summer months    Follow-up in 6 months    Signed electronically by: Lyndle Herrlich, MD  [facility]  [facility] [address] [place], [place] [address] Phone: [phone] FAX: [phone]   CC: Bradd Canary, MD [address] [place] [address] Phone: [phone] Fax: [phone]   Return to Endocrinology clinic as below: Future Appointments  Date Time Provider Department Center  09/02/2022 10:40 AM Bradd Canary, MD [facility]  02/19/2023  9:50 AM Leeanna Slaby, Konrad Dolores, MD [facility] None

## 2022-08-22 ENCOUNTER — Ambulatory Visit: Payer: Medicare Other | Admitting: Internal Medicine

## 2022-08-22 ENCOUNTER — Encounter: Payer: Self-pay | Admitting: Family Medicine

## 2022-08-25 DIAGNOSIS — Z23 Encounter for immunization: Secondary | ICD-10-CM | POA: Diagnosis not present

## 2022-08-28 ENCOUNTER — Encounter: Payer: Self-pay | Admitting: Internal Medicine

## 2022-08-29 ENCOUNTER — Other Ambulatory Visit: Payer: Self-pay | Admitting: Family Medicine

## 2022-08-29 ENCOUNTER — Encounter: Payer: Self-pay | Admitting: Family Medicine

## 2022-08-29 MED ORDER — DEXTROAMPHETAMINE SULFATE 10 MG PO TABS: 10.0000 mg | ORAL_TABLET | Freq: Every day | ORAL | 0 refills | Status: DC

## 2022-08-29 NOTE — Telephone Encounter (Signed)
Requesting: Dextrostat Contract:03/02/2018 UDS:08/23/21 Last Visit: 03/05/22 Next Visit:09/02/22 Last Refill: 07/29/22 #30 and 0RF   Please Advise

## 2022-08-31 NOTE — Assessment & Plan Note (Signed)
hgba1c acceptable, minimize simple carbs. Increase exercise as tolerated.  

## 2022-08-31 NOTE — Assessment & Plan Note (Signed)
Tolerating Lexapro 

## 2022-08-31 NOTE — Assessment & Plan Note (Signed)
Encourage heart healthy diet such as MIND or DASH diet, increase exercise, avoid trans fats, simple carbohydrates and processed foods, consider a krill or fish or flaxseed oil cap daily.  °

## 2022-08-31 NOTE — Assessment & Plan Note (Signed)
On Levothyroxine, continue to monitor 

## 2022-08-31 NOTE — Assessment & Plan Note (Signed)
Supplement and monitor 

## 2022-08-31 NOTE — Assessment & Plan Note (Signed)
Stable on low dose dextrostat

## 2022-09-02 ENCOUNTER — Telehealth: Payer: Self-pay

## 2022-09-02 ENCOUNTER — Ambulatory Visit (INDEPENDENT_AMBULATORY_CARE_PROVIDER_SITE_OTHER): Payer: Medicare Other | Admitting: Family Medicine

## 2022-09-02 ENCOUNTER — Encounter: Payer: Self-pay | Admitting: Family Medicine

## 2022-09-02 VITALS — BP 126/76 | HR 62 | Temp 97.9°F | Resp 16 | Ht 65.0 in | Wt 171.6 lb

## 2022-09-02 DIAGNOSIS — E782 Mixed hyperlipidemia: Secondary | ICD-10-CM

## 2022-09-02 DIAGNOSIS — E063 Autoimmune thyroiditis: Secondary | ICD-10-CM | POA: Diagnosis not present

## 2022-09-02 DIAGNOSIS — R32 Unspecified urinary incontinence: Secondary | ICD-10-CM | POA: Diagnosis not present

## 2022-09-02 DIAGNOSIS — K5900 Constipation, unspecified: Secondary | ICD-10-CM

## 2022-09-02 DIAGNOSIS — R739 Hyperglycemia, unspecified: Secondary | ICD-10-CM

## 2022-09-02 DIAGNOSIS — E039 Hypothyroidism, unspecified: Secondary | ICD-10-CM | POA: Diagnosis not present

## 2022-09-02 DIAGNOSIS — F09 Unspecified mental disorder due to known physiological condition: Secondary | ICD-10-CM | POA: Diagnosis not present

## 2022-09-02 DIAGNOSIS — M8580 Other specified disorders of bone density and structure, unspecified site: Secondary | ICD-10-CM

## 2022-09-02 DIAGNOSIS — Z79899 Other long term (current) drug therapy: Secondary | ICD-10-CM | POA: Diagnosis not present

## 2022-09-02 DIAGNOSIS — F900 Attention-deficit hyperactivity disorder, predominantly inattentive type: Secondary | ICD-10-CM

## 2022-09-02 DIAGNOSIS — F330 Major depressive disorder, recurrent, mild: Secondary | ICD-10-CM

## 2022-09-02 DIAGNOSIS — E559 Vitamin D deficiency, unspecified: Secondary | ICD-10-CM | POA: Diagnosis not present

## 2022-09-02 LAB — URINALYSIS, ROUTINE W REFLEX MICROSCOPIC
Bilirubin Urine: NEGATIVE
Hgb urine dipstick: NEGATIVE
Ketones, ur: NEGATIVE
Leukocytes,Ua: NEGATIVE
Nitrite: NEGATIVE
RBC / HPF: NONE SEEN (ref 0–?)
Specific Gravity, Urine: <=1.005 — AB (ref 1.000–1.030)
Total Protein, Urine: NEGATIVE
Urine Glucose: NEGATIVE
Urobilinogen, UA: 0.2 (ref 0.0–1.0)
WBC, UA: NONE SEEN (ref 0–?)
pH: 7 (ref 5.0–8.0)

## 2022-09-02 MED ORDER — PREMARIN 0.625 MG/GM VA CREA: 1.0000 | TOPICAL_CREAM | VAGINAL | 3 refills | Status: DC

## 2022-09-02 MED ORDER — DEXTROAMPHETAMINE SULFATE 10 MG PO TABS: 10.0000 mg | ORAL_TABLET | Freq: Every day | ORAL | 0 refills | Status: DC

## 2022-09-02 MED ORDER — ESCITALOPRAM OXALATE 10 MG PO TABS: 15.0000 mg | ORAL_TABLET | Freq: Every day | ORAL | 1 refills | Status: DC

## 2022-09-02 NOTE — Progress Notes (Signed)
Subjective:   By signing my name below, I, Barrett Shell, attest that this documentation has been prepared under the direction and in the presence of Bradd Canary, MD. 09/02/2022   Patient ID: Shannon [name], female    DOB: 1951/02/16, 72 y.o.   MRN: [id number]  Chief Complaint  Patient presents with   Follow-up    Follow up    HPI Patient is in today for a follow-up appointment.   Part-time residence  She has a residence in [place] and stays there from June to September. She wants to make sure she is still able to receive her prescriptions during that time.   Urinary incontinence  She does pelvic floor physical therapy to help manage her urinary incontinence. She is currently not drinking coffee, which helped at first, but has worsened again since. Wine and seltzer Tonie also irritate her bladder. She has not yet tried azo cranberry tablets.   Constipation She has constipation and difficulty completing bowel movements. When she takes a colon max pill with smooth moves tea it helps her be able to pass a bowel movement. with She uses premarin cream. She does not take a fiber supplement at this time.   Brain fog She has trouble remembering names recently. She denies having any issues finding her car in a parking lot or forgetting where she is. She has taken a cognitive exam in the test, but is not ready to take another cognitive test at this time.   Skin She has been putting 0.25% retinae cream on her arms and hands recently and has patches of bruising as a result.   Mood She reports that she Demetress be depressed and she bursts in to tears often at work. She takes 10 mg lexapro daily PO. She also has increased stress. She is not interested in counseling at this time.    Past Medical History:  Diagnosis Date   ADHD (attention deficit hyperactivity disorder), 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   Anxiety    Asthma    Basal cell carcinoma of face    Benign neoplasm of skin of lower limb     Bilateral carotid bruits 04/12/2019   Diverticulosis    Gestational diabetes    Hammer toe, acquired 03/15/2009   Heart murmur 03/02/2018   History of COVID-19 09/14/2020   Hyperglycemia 02/24/2017   Hyperlipidemia, mixed 03/11/2016   Hypothyroidism    Knee pain 09/26/2010   Crepitation on the right and minimal on the left. Left torn meniscus Right arthritis   Lead exposure 03/02/2018   Lumbar degenerative disc disease 02/24/2017   MRI 2019 confirms multilevel change  Sciatica at times 2/2 this and periodic radiculopathy   Major depressive disorder 03/08/2013   Melanocytic nevi of trunk    Menopause    Neck pain on left side 09/10/2018   Suspect this is DDD and DJD - XR is pending Neurapraxic spasm to left trapezius   Neurofibroma 07/20/2019   Nuclear sclerotic cataract of both eyes 07/23/2012   OAB (overactive bladder) 08/05/2016   Osteopenia    PONV (postoperative nausea and vomiting)    Rosacea    Sciatica of left side 02/24/2017   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   By exam she has chronic tendinopathy of Glut Med   Urinary urgency 09/17/2020   Vertigo 04/12/2019   Vitamin D deficiency 02/28/2014    Past Surgical History:  Procedure Laterality Date   ANTERIOR AND POSTERIOR REPAIR  05/13/2022   Procedure: ANTERIOR (  CYSTOCELE) AND POSTERIOR REPAIR (RECTOCELE), PERINEORRHAPHY;  Surgeon: Marguerita Beards, MD;  Location: [facility];  Service: Gynecology;;   blephoroplasty     BROW LIFT     CYSTOSCOPY N/A 05/13/2022   Procedure: CYSTOSCOPY;  Surgeon: Marguerita Beards, MD;  Location: [facility];  Service: Gynecology;  Laterality: N/A;   DILATION AND CURETTAGE OF UTERUS     SKIN CANCER EXCISION     right lower lip   TONSILLECTOMY AND ADENOIDECTOMY      Family History  Problem Relation Age of Onset   Colon polyps Mother    Parkinson's disease Mother        developed Parkinson's disease dementia   Diabetes Father     Emphysema Father    Tuberculosis Father    Alcohol abuse Sister    Cancer Sister        skin cancer, melanoma cell, basil cell   Cancer Sister        melanoma   Anxiety disorder Brother    Parkinson's disease Maternal Grandmother        developed Parkinson's disease dementia   Colon cancer Maternal Grandfather    ADD / ADHD Daughter    Anxiety disorder Daughter    Depression Daughter    ADD / ADHD Son    Anxiety disorder Son    Depression Son     Social History   Socioeconomic History   Marital status: Single    Spouse name: Not on file   Number of children: 2   Years of education: 18   Highest education level: Master's degree (e.g., MA, MS, MEng, MEd, MSW, MBA)  Occupational History   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   Drug use: No   Sexual activity: Not Currently  Other Topics Concern   Not on file  Social History Narrative   Retired from [employer].   No major dietary restrictions except avoid gluten.       Social Determinants of Health   Financial Resource Strain: Low Risk  (08/28/2022)   Overall Financial Resource Strain (CARDIA)    Difficulty of Paying Living Expenses: Not hard at all  Food Insecurity: No Food Insecurity (08/28/2022)   Hunger Vital Sign    Worried About Running Out of Food in the Last Year: Never true    Ran Out of Food in the Last Year: Never true  Transportation Needs: No Transportation Needs (08/28/2022)   PRAPARE - Administrator, Civil Service (Medical): No    Lack of Transportation (Non-Medical): No  Physical  Activity: Insufficiently Active (08/28/2022)   Exercise Vital Sign    Days of Exercise per Week: 4 days    Minutes of Exercise per Session: 30 min  Stress: Stress Concern Present (08/28/2022)   [employer] of Occupational Health - Occupational Stress Questionnaire    Feeling of Stress : To some extent  Social Connections: Moderately Integrated (08/28/2022)   Social  Connection and Isolation Panel [NHANES]    Frequency of Communication with Friends and Family: More than three times a week    Frequency of Social Gatherings with Friends and Family: Twice a week    Attends Religious Services: More than 4 times per year    Active Member of [employer] or Organizations: Yes    Attends Engineer, structural: More than 4 times per year    Marital Status: Divorced  Intimate Partner Violence: Not At Risk (08/01/2022)   Humiliation, Afraid, Rape, and Kick questionnaire    Fear of Current or Ex-Partner: No    Emotionally Abused: No    Physically Abused: No    Sexually Abused: No    Outpatient Medications Prior to Visit  Medication Sig Dispense Refill   Albuterol Sulfate (PROAIR RESPICLICK) 108 (90 Base) MCG/ACT AEPB Inhale 2 puffs every 4-6 hours as needed for cough and shortness of breath. 1 each 0   Cholecalciferol 25 MCG (1000 UT) tablet Take by mouth.     GAMMA AMINOBUTYRIC ACID PO Take 200 mg by mouth as needed. Up to bid     hydrocortisone (ANUSOL-HC) 2.5 % rectal cream Place 1 Application rectally 2 (two) times daily. 30 g 0   Krill Oil 1000 MG CAPS      Lactobacillus Acidophilus (ACIDOPHILUS LACTOBACILLUS) 10 BIL UNT/GM POWD Take 1 capsule by mouth daily.     LORazepam (ATIVAN) 2 MG tablet Take 1 tablet (2 mg total) by mouth every 8 (eight) hours as needed for anxiety. 40 tablet 0   MAGNESIUM GLYCINATE PLUS PO Take 100 mg by mouth 2 (two) times daily.     Nutritional Supplements (PEPTINEX DT/PREBIOTICS PO) Take by mouth.     nystatin ointment (MYCOSTATIN)      polyethylene glycol powder  (GLYCOLAX/MIRALAX) 17 GM/SCOOP powder Take 17 g by mouth daily. Drink 17g (1 scoop) dissolved in water per day. 255 g 0   TIROSINT 88 MCG CAPS TAKE ONE Capsule BY MOUTH EVERY DAY BEFORE BREAKFAST 90 capsule 1   tretinoin (RETIN-A) 0.025 % cream Apply 0.025 % topically daily. Pea size amount     dextroamphetamine (DEXTROSTAT) 10 MG tablet Take 1 tablet (10 mg total) by mouth daily. 30 tablet 0   escitalopram (LEXAPRO) 10 MG tablet Take 1 tablet (10 mg total) by mouth daily. 90 tablet 1   No facility-administered medications prior to visit.    Allergies  Allergen Reactions   Gluten Meal Other (See Comments)    GI symptoms; has BM everytime she urinates GI symptoms; has BM everytime she urinates    Review of Systems  Gastrointestinal:  Positive for constipation.  Genitourinary:  Positive for dysuria, frequency and urgency.       Objective:    Physical Exam Constitutional:      General: She is not in acute distress.    Appearance: Normal appearance.  HENT:     Head: Normocephalic and atraumatic.     Right Ear: External ear normal.     Left Ear: External ear normal.  Eyes:     Extraocular Movements: Extraocular movements intact.     Pupils: Pupils are equal, round, and reactive to light.  Cardiovascular:     Rate and Rhythm: Normal rate and regular rhythm.     Heart sounds: Normal heart sounds. No murmur heard.    No gallop.  Pulmonary:     Effort: Pulmonary effort is normal. No respiratory distress.     Breath sounds: Normal breath sounds.  No wheezing or rales.  Skin:    General: Skin is warm.  Neurological:     Mental Status: She is alert and oriented to person, place, and time.  Psychiatric:        Judgment: Judgment normal.     BP 126/76 (BP Location: Right Arm, Patient Position: Sitting, Cuff Size: Normal)   Pulse 62   Temp 97.9 F (36.6 C) (Oral)   Resp 16   Ht 5\' 5"  (1.651 m)   Wt 171 lb 9.6 oz (77.8 kg)   LMP 03/29/2004   SpO2 95%   BMI 28.56 kg/m  Wt  Readings from Last 3 Encounters:  09/02/22 171 lb 9.6 oz (77.8 kg)  08/20/22 169 lb (76.7 kg)  08/01/22 170 lb (77.1 kg)       Assessment & Plan:  ADHD (attention deficit hyperactivity disorder), inattentive type Assessment & Plan: Stable on low dose dextrostat   Hyperglycemia Assessment & Plan: hgba1c acceptable, minimize simple carbs. Increase exercise as tolerated.   Orders: -     CBC with Differential/Platelet; Future -     Comprehensive metabolic panel; Future -     Hemoglobin A1c; Future  Hyperlipidemia, mixed Assessment & Plan: Encourage heart healthy diet such as MIND or DASH diet, increase exercise, avoid trans fats, simple carbohydrates and processed foods, consider a krill or fish or flaxseed oil cap daily.    Orders: -     Lipid panel; Future  Hypothyroidism, unspecified type Assessment & Plan: On Levothyroxine, continue to monitor    Vitamin D deficiency Assessment & Plan: Supplement and monitor   Orders: -     VITAMIN D 25 Hydroxy (Vit-D Deficiency, Fractures); Future  Mild episode of recurrent major depressive disorder [facility]) Assessment & Plan: Tolerating Lexapro   High risk medication use -     Drug Monitoring Panel [id number] , Urine  Urinary incontinence, unspecified type Assessment & Plan: Is working with pelvic PT now due to some incontinence. She notes it is some worse since having her rectocele repaired. Will try adding Premarin cream twice weekly to vaginal mucosa.  Orders: -     Urinalysis, Routine w reflex microscopic -     Urine Culture -     CBC with Differential/Platelet; Future  Hashimoto's thyroiditis Assessment & Plan: [facility] and she is doing well   Mild cognitive disorder Assessment & Plan: Has had neurocognitive testing and was normal but then she been worse since her GYN surgery. We could consider testing again, she declines for now and is still managing ADLs   Osteopenia, unspecified location Assessment &  Plan: Encouraged to get adequate exercise, calcium and vitamin d intake    Constipation, unspecified constipation type Assessment & Plan: Colon max with magnesium and drinks smooth move tea and her bowels move. Takes a probiotic. Notes a sense of incomplete evacuation is present. No bloody or tarry stool. Did feel like a change in thyroid meds correlates with this getting worse. Add a fiber supplement with plenty of water such as oat or psyllium fiber. If no improvement consider referral back to Gastroenterology for evaluation   Other orders -     Premarin; Place 1 Applicatorful vaginally 2 (two) times a week.  Dispense: 42.5 g; Refill: 3 -     Escitalopram Oxalate; Take 1.5 tablets (15 mg total) by mouth daily.  Dispense: 135 tablet; Refill: 1 -     Dextroamphetamine Sulfate; Take 1 tablet (10 mg total) by mouth daily. Vacation fill  Dispense:  30 tablet; Refill: 0    I, Danise Edge, MD, personally preformed the services described in this documentation.  All medical record entries made by the scribe were at my direction and in my presence.  I have reviewed the chart and discharge instructions (if applicable) and agree that the record reflects my personal performance and is accurate and complete. 09/02/2022  Danise Edge, MD   Mercer Pod as a scribe for Danise Edge, MD.,have documented all relevant documentation on the behalf of Danise Edge, MD,as directed by  Danise Edge, MD while in the presence of Danise Edge, MD.

## 2022-09-02 NOTE — Assessment & Plan Note (Signed)
Has had neurocognitive testing and was normal but then she been worse since her GYN surgery. We could consider testing again, she declines for now and is still managing ADLs

## 2022-09-02 NOTE — Telephone Encounter (Signed)
Pt's plan doesn't cover Premarin, preferred alternatives is generic estradiol vaginal cream. Please advise.

## 2022-09-02 NOTE — Assessment & Plan Note (Signed)
Encouraged to get adequate exercise, calcium and vitamin d intake 

## 2022-09-02 NOTE — Assessment & Plan Note (Signed)
Is working with pelvic PT now due to some incontinence. She notes it is some worse since having her rectocele repaired. Will try adding Premarin cream twice weekly to vaginal mucosa.

## 2022-09-02 NOTE — Assessment & Plan Note (Signed)
[facility] and she is doing well

## 2022-09-02 NOTE — Assessment & Plan Note (Signed)
Colon max with magnesium and drinks smooth move tea and her bowels move. Takes a probiotic. Notes a sense of incomplete evacuation is present. No bloody or tarry stool. Did feel like a change in thyroid meds correlates with this getting worse. Add a fiber supplement with plenty of water such as oat or psyllium fiber. If no improvement consider referral back to Gastroenterology for evaluation

## 2022-09-02 NOTE — Patient Instructions (Signed)
Urinary Incontinence Urinary incontinence refers to a condition in which a person is unable to control where and when to pass urine. A person with this condition will urinate involuntarily. This means that the person urinates when he or she does not mean to. What are the causes? This condition Nina be caused by: Medicines. Infections. Constipation. Overactive bladder muscles. Weak bladder muscles. Weak pelvic floor muscles. These muscles provide support for the bladder, intestine, and, in women, the uterus. Enlarged prostate in men. The prostate is a gland near the bladder. When it gets too big, it can pinch the urethra. With the urethra blocked, the bladder can weaken and lose the ability to empty properly. Surgery. Emotional factors, such as anxiety, stress, or post-traumatic stress disorder (PTSD). Spinal cord injury, nerve injury, or other neurological conditions. Pelvic organ prolapse. This happens in women when organs move out of place and into the vagina. This movement can prevent the bladder and urethra from working properly. What increases the risk? The following factors Alixis make you more likely to develop this condition: Age. The older you are, the higher the risk. Obesity. Being physically inactive. Pregnancy and childbirth. Menopause. Diseases that affect the nerves or spinal cord. Long-term, or chronic, coughing. This can increase pressure on the bladder and pelvic floor muscles. What are the signs or symptoms? Symptoms Alleen vary depending on the type of urinary incontinence you have. They include: A sudden urge to urinate, and passing urine involuntarily before you can get to a bathroom (urge incontinence). Suddenly passing urine when doing activities that force urine to pass, such as coughing, laughing, exercising, or sneezing (stress incontinence). Needing to urinate often but urinating only a small amount, or constantly dribbling urine (overflow incontinence). Urinating  because you cannot get to the bathroom in time due to a physical disability, such as arthritis or injury, or due to a communication or thinking problem, such as Alzheimer's disease (functional incontinence). How is this diagnosed? This condition Najla be diagnosed based on: Your medical history. A physical exam. Tests, such as: Urine tests. X-rays of your kidney and bladder. Ultrasound. CT scan. Cystoscopy. In this procedure, a health care provider inserts a tube with a light and camera (cystoscope) through the urethra and into the bladder to check for problems. Urodynamic testing. These tests assess how well the bladder, urethra, and sphincter can store and release urine. There are different types of urodynamic tests, and they vary depending on what the test is measuring. To help diagnose your condition, your health care provider Kiyara recommend that you keep a log of when you urinate and how much you urinate. How is this treated? Treatment for this condition depends on the type of incontinence that you have and its cause. Treatment Smriti include: Lifestyle changes, such as: Quitting smoking. Maintaining a healthy weight. Staying active. Try to get 150 minutes of moderate-intensity exercise every week. Ask your health care provider which activities are safe for you. Eating a healthy diet. Avoid high-fat foods, like fried foods. Avoid refined carbohydrates like white bread and white rice. Limit how much alcohol and caffeine you drink. Increase your fiber intake. Healthy sources of fiber include beans, whole grains, and fresh fruits and vegetables. Behavioral changes, such as: Pelvic floor muscle exercises. Bladder training, such as lengthening the amount of time between bathroom breaks, or using the bathroom at regular intervals. Using techniques to suppress bladder urges. This can include distraction techniques or controlled breathing exercises. Medicines, such as: Medicines to relax the  bladder   muscles and prevent bladder spasms. Medicines to help slow or prevent the growth of a man's prostate. Botox injections. These can help relax the bladder muscles. Treatments, such as: Using pulses of electricity to help change bladder reflexes (electrical nerve stimulation). For women, using a medical device to prevent urine leaks. This is a small, tampon-like, disposable device that is inserted into the urethra. Injecting collagen or carbon beads (bulking agents) into the urinary sphincter. These can help thicken tissue and close the bladder opening. Surgery. Follow these instructions at home: Lifestyle Limit alcohol and caffeine. These can fill your bladder quickly and irritate it. Keep yourself clean to help prevent odors and skin damage. Ask your health care provider about special skin creams and cleansers that can protect the skin from urine. Consider wearing pads or adult diapers. Make sure to change them regularly, and always change them right after experiencing incontinence. General instructions Take over-the-counter and prescription medicines only as told by your health care provider. Use the bathroom about every 3-4 hours, even if you do not feel the need to urinate. Try to empty your bladder completely every time. After urinating, wait a minute. Then try to urinate again. Make sure you are in a relaxed position while urinating. If your incontinence is caused by nerve problems, keep a log of the medicines you take and the times you go to the bathroom. Keep all follow-up visits. This is important. Where to find more information National Institute of Diabetes and Digestive and Kidney Diseases: www.niddk.nih.gov American Urology Association: www.urologyhealth.org Contact a health care provider if: You have pain that gets worse. Your incontinence gets worse. Get help right away if: You have a fever or chills. You are unable to urinate. You have redness in your groin area or  down your legs. Summary Urinary incontinence refers to a condition in which a person is unable to control where and when to pass urine. This condition Priscilla be caused by medicines, infection, weak bladder muscles, weak pelvic floor muscles, enlargement of the prostate (in men), or surgery. Factors such as older age, obesity, pregnancy and childbirth, menopause, neurological diseases, and chronic coughing Emira increase your risk for developing this condition. Types of urinary incontinence include urge incontinence, stress incontinence, overflow incontinence, and functional incontinence. This condition is usually treated first with lifestyle and behavioral changes, such as quitting smoking, eating a healthier diet, and doing regular pelvic floor exercises. Other treatment options include medicines, bulking agents, medical devices, electrical nerve stimulation, or surgery. This information is not intended to replace advice given to you by your health care provider. Make sure you discuss any questions you have with your health care provider. Document Revised: 11/19/2019 Document Reviewed: 11/19/2019 Elsevier Patient Education  2023 Elsevier Inc.  

## 2022-09-03 LAB — URINE CULTURE
MICRO NUMBER:: 14917191
Result:: NO GROWTH
SPECIMEN QUALITY:: ADEQUATE

## 2022-09-03 NOTE — Addendum Note (Signed)
Addended by: Thelma Barge D on: 09/03/2022 12:24 PM   Modules accepted: Orders

## 2022-09-03 NOTE — Telephone Encounter (Signed)
Noted  

## 2022-09-03 NOTE — Telephone Encounter (Signed)
Bradd Canary, MD  You16 hours ago (5:07 PM)    She knows she was planning to pay cash thanks

## 2022-09-04 ENCOUNTER — Other Ambulatory Visit (INDEPENDENT_AMBULATORY_CARE_PROVIDER_SITE_OTHER): Payer: Medicare Other

## 2022-09-04 DIAGNOSIS — E782 Mixed hyperlipidemia: Secondary | ICD-10-CM

## 2022-09-04 DIAGNOSIS — R739 Hyperglycemia, unspecified: Secondary | ICD-10-CM

## 2022-09-04 DIAGNOSIS — R32 Unspecified urinary incontinence: Secondary | ICD-10-CM | POA: Diagnosis not present

## 2022-09-04 LAB — HEMOGLOBIN A1C: Hgb A1c MFr Bld: 5.4 % (ref 4.6–6.5)

## 2022-09-04 LAB — COMPREHENSIVE METABOLIC PANEL
ALT: 17 U/L (ref 0–35)
AST: 24 U/L (ref 0–37)
Albumin: 4.1 g/dL (ref 3.5–5.2)
Alkaline Phosphatase: 64 U/L (ref 39–117)
BUN: 13 mg/dL (ref 6–23)
CO2: 26 mEq/L (ref 19–32)
Calcium: 9.3 mg/dL (ref 8.4–10.5)
Chloride: 103 mEq/L (ref 96–112)
Creatinine, Ser: 0.99 mg/dL (ref 0.40–1.20)
GFR: 57.18 mL/min — ABNORMAL LOW (ref >60.00)
Glucose, Bld: 102 mg/dL — ABNORMAL HIGH (ref 70–99)
Potassium: 4.2 mEq/L (ref 3.5–5.1)
Sodium: 139 mEq/L (ref 135–145)
Total Bilirubin: 1 mg/dL (ref 0.2–1.2)
Total Protein: 6.7 g/dL (ref 6.0–8.3)

## 2022-09-04 LAB — LIPID PANEL
Cholesterol: 195 mg/dL (ref 0–200)
HDL: 55.4 mg/dL (ref >39.00)
LDL Cholesterol: 128 mg/dL — ABNORMAL HIGH (ref 0–99)
NonHDL: 139.92
Total CHOL/HDL Ratio: 4
Triglycerides: 61 mg/dL (ref 0.0–149.0)
VLDL: 12.2 mg/dL (ref 0.0–40.0)

## 2022-09-04 LAB — CBC WITH DIFFERENTIAL/PLATELET
Basophils Absolute: 0.1 10*3/uL (ref 0.0–0.1)
Basophils Relative: 1.2 % (ref 0.0–3.0)
Eosinophils Absolute: 0.1 10*3/uL (ref 0.0–0.7)
Eosinophils Relative: 3 % (ref 0.0–5.0)
HCT: 39.3 % (ref 36.0–46.0)
Hemoglobin: 13.3 g/dL (ref 12.0–15.0)
Lymphocytes Relative: 44.4 % (ref 12.0–46.0)
Lymphs Abs: 1.8 10*3/uL (ref 0.7–4.0)
MCHC: 33.9 g/dL (ref 30.0–36.0)
MCV: 98.5 fl (ref 78.0–100.0)
Monocytes Absolute: 0.3 10*3/uL (ref 0.1–1.0)
Monocytes Relative: 7.8 % (ref 3.0–12.0)
Neutro Abs: 1.8 10*3/uL (ref 1.4–7.7)
Neutrophils Relative %: 43.6 % (ref 43.0–77.0)
Platelets: 272 10*3/uL (ref 150.0–400.0)
RBC: 3.99 Mil/uL (ref 3.87–5.11)
RDW: 11.9 % (ref 11.5–15.5)
WBC: 4.1 10*3/uL (ref 4.0–10.5)

## 2022-09-05 ENCOUNTER — Encounter: Payer: Self-pay | Admitting: Family Medicine

## 2022-09-05 DIAGNOSIS — M5441 Lumbago with sciatica, right side: Secondary | ICD-10-CM | POA: Diagnosis not present

## 2022-09-05 DIAGNOSIS — M542 Cervicalgia: Secondary | ICD-10-CM | POA: Diagnosis not present

## 2022-09-05 LAB — DRUG MONITORING PANEL 376104, URINE
Amphetamine: 848 ng/mL — ABNORMAL HIGH (ref <250)
Amphetamines: POSITIVE ng/mL — AB (ref <500)
Barbiturates: NEGATIVE ng/mL (ref <300)
Benzodiazepines: NEGATIVE ng/mL (ref <100)
Cocaine Metabolite: NEGATIVE ng/mL (ref <150)
Desmethyltramadol: NEGATIVE ng/mL (ref <100)
Methamphetamine: NEGATIVE ng/mL (ref <250)
Opiates: NEGATIVE ng/mL (ref <100)
Oxycodone: NEGATIVE ng/mL (ref <100)
Tramadol: NEGATIVE ng/mL (ref <100)

## 2022-09-05 LAB — DM TEMPLATE

## 2022-09-05 NOTE — Telephone Encounter (Signed)
Pt has been advised that monitoring for this type of medication is protocol.   Allso see the portion of the message about the fatigue, recent labs were NML- TSH 08/20/22, Hgb 13.3 5/8/2. Please advise.

## 2022-09-06 ENCOUNTER — Other Ambulatory Visit: Payer: Self-pay | Admitting: Internal Medicine

## 2022-09-07 ENCOUNTER — Encounter: Payer: Self-pay | Admitting: Family Medicine

## 2022-09-10 ENCOUNTER — Telehealth: Payer: Self-pay | Admitting: *Deleted

## 2022-09-10 NOTE — Telephone Encounter (Signed)
Prior auth started via cover my meds.  Awaiting determination.   Key: [id number]

## 2022-09-12 ENCOUNTER — Other Ambulatory Visit: Payer: Self-pay | Admitting: Family Medicine

## 2022-09-12 MED ORDER — ESTRADIOL 0.1 MG/GM VA CREA: TOPICAL_CREAM | VAGINAL | 1 refills | Status: DC

## 2022-09-12 NOTE — Telephone Encounter (Signed)
Denied  They Shannon [name] pay for Estrace vaginal cream but they did not put in denial.  The denial stated they Shannon [name] cover Osphena tablet.  We received a request from your doctor for a prior authorization on PREMARIN VAGINAL CREAM-APPL. Based on the information received, we are unable to approve the request to have this drug covered under your Part D benefit. The drug you asked for is not listed in your preferred drug list (formulary). The preferred drug(s), you Shannon [name] not have tried, are:  Osphena tablet Your provider needs to give [place] medical reasons why the preferred drug(s) would not work for you and/or would have bad side effects. Sometimes a preferred drug needs more review for approval. Additionally, some preferred drugs listed Shannon [name] be the same drugs with different strengths or forms. Shannon [name] Shannon [name] only require one strength or form of that drug to be tried. This decision was from [facility] NonFormulary Exceptions Coverage Policy

## 2022-09-12 NOTE — Addendum Note (Signed)
Addended by: Thelma Barge D on: 09/12/2022 01:59 PM   Modules accepted: Orders

## 2022-10-09 DIAGNOSIS — M5441 Lumbago with sciatica, right side: Secondary | ICD-10-CM | POA: Diagnosis not present

## 2022-10-09 DIAGNOSIS — M542 Cervicalgia: Secondary | ICD-10-CM | POA: Diagnosis not present

## 2022-10-13 ENCOUNTER — Encounter: Payer: Self-pay | Admitting: Family Medicine

## 2022-10-14 MED ORDER — ESCITALOPRAM OXALATE 10 MG PO TABS: 15.0000 mg | ORAL_TABLET | Freq: Every day | ORAL | 1 refills | Status: DC

## 2022-10-28 ENCOUNTER — Encounter: Payer: Self-pay | Admitting: Family Medicine

## 2022-10-29 ENCOUNTER — Other Ambulatory Visit: Payer: Self-pay | Admitting: Family Medicine

## 2022-10-29 MED ORDER — DEXTROAMPHETAMINE SULFATE 10 MG PO TABS: 10.0000 mg | ORAL_TABLET | Freq: Every day | ORAL | 0 refills | Status: DC

## 2022-10-29 MED ORDER — GABAPENTIN 300 MG PO CAPS: 300.0000 mg | ORAL_CAPSULE | Freq: Three times a day (TID) | ORAL | 1 refills | Status: DC

## 2022-11-04 ENCOUNTER — Telehealth: Payer: Self-pay | Admitting: Family Medicine

## 2022-11-04 ENCOUNTER — Other Ambulatory Visit: Payer: Self-pay | Admitting: Family Medicine

## 2022-11-04 MED ORDER — DEXTROAMPHETAMINE SULFATE 10 MG PO TABS: 10.0000 mg | ORAL_TABLET | Freq: Every day | ORAL | 0 refills | Status: DC

## 2022-11-04 NOTE — Telephone Encounter (Signed)
Patient called and states is in [facility] [place] for the summer and needs dextroamphetamine (DEXTROSTAT) 10 MG tablet  sent to [facility] on [facility] in [place].

## 2022-11-30 ENCOUNTER — Other Ambulatory Visit: Payer: Self-pay | Admitting: Family Medicine

## 2022-12-03 MED ORDER — LORAZEPAM 2 MG PO TABS: 2.0000 mg | ORAL_TABLET | Freq: Three times a day (TID) | ORAL | 1 refills | Status: DC | PRN

## 2022-12-05 NOTE — Telephone Encounter (Signed)
Pt called & stated the prescription of  - LORazepam (ATIVAN) 2 MG tablet needs to be sent to  [facility] Address: [address] [place], [place] [address] Hours:  Open ? Closes 10?PM Senior hours: 7?AM-8?AM Tue  More hours Confirmed by this business 6 weeks ago Phone: [phone]  Pt also requested to have dextroamphetamine (DEXTROSTAT) 10 MG refilled.   Please refill & send prescriptions to updated pharmacy & advise pt.

## 2022-12-09 ENCOUNTER — Encounter: Payer: Self-pay | Admitting: Family Medicine

## 2022-12-10 ENCOUNTER — Other Ambulatory Visit: Payer: Self-pay | Admitting: Family

## 2022-12-10 MED ORDER — LORAZEPAM 2 MG PO TABS: 2.0000 mg | ORAL_TABLET | Freq: Three times a day (TID) | ORAL | 1 refills | Status: AC | PRN

## 2022-12-10 MED ORDER — DEXTROAMPHETAMINE SULFATE 10 MG PO TABS: 10.0000 mg | ORAL_TABLET | Freq: Every day | ORAL | 0 refills | Status: DC

## 2022-12-25 DIAGNOSIS — M545 Low back pain, unspecified: Secondary | ICD-10-CM | POA: Diagnosis not present

## 2022-12-25 DIAGNOSIS — M9901 Segmental and somatic dysfunction of cervical region: Secondary | ICD-10-CM | POA: Diagnosis not present

## 2022-12-25 DIAGNOSIS — M5416 Radiculopathy, lumbar region: Secondary | ICD-10-CM | POA: Diagnosis not present

## 2022-12-25 DIAGNOSIS — M542 Cervicalgia: Secondary | ICD-10-CM | POA: Diagnosis not present

## 2022-12-25 DIAGNOSIS — M9903 Segmental and somatic dysfunction of lumbar region: Secondary | ICD-10-CM | POA: Diagnosis not present

## 2022-12-25 DIAGNOSIS — M9904 Segmental and somatic dysfunction of sacral region: Secondary | ICD-10-CM | POA: Diagnosis not present

## 2022-12-25 DIAGNOSIS — M48062 Spinal stenosis, lumbar region with neurogenic claudication: Secondary | ICD-10-CM | POA: Diagnosis not present

## 2022-12-30 ENCOUNTER — Encounter: Payer: Self-pay | Admitting: Family Medicine

## 2022-12-30 DIAGNOSIS — M47816 Spondylosis without myelopathy or radiculopathy, lumbar region: Secondary | ICD-10-CM | POA: Diagnosis not present

## 2022-12-30 DIAGNOSIS — M48061 Spinal stenosis, lumbar region without neurogenic claudication: Secondary | ICD-10-CM | POA: Diagnosis not present

## 2022-12-31 MED ORDER — ESTRADIOL 0.1 MG/GM VA CREA: TOPICAL_CREAM | VAGINAL | 1 refills | Status: DC

## 2023-01-04 ENCOUNTER — Encounter: Payer: Self-pay | Admitting: Family Medicine

## 2023-01-06 ENCOUNTER — Other Ambulatory Visit: Payer: Self-pay | Admitting: Family

## 2023-01-06 MED ORDER — DEXTROAMPHETAMINE SULFATE 10 MG PO TABS: 10.0000 mg | ORAL_TABLET | Freq: Every day | ORAL | 0 refills | Status: DC

## 2023-01-06 NOTE — Telephone Encounter (Signed)
Requesting: dextrostat 10mg   Contract:09/02/22 UDS:09/02/22 Last Visit: 09/02/22 Next Visit: 03/17/23 Last Refill: 12/10/22 #30 and 0RF   Please Advise

## 2023-01-08 DIAGNOSIS — M48062 Spinal stenosis, lumbar region with neurogenic claudication: Secondary | ICD-10-CM | POA: Diagnosis not present

## 2023-01-08 DIAGNOSIS — M9904 Segmental and somatic dysfunction of sacral region: Secondary | ICD-10-CM | POA: Diagnosis not present

## 2023-01-08 DIAGNOSIS — M9903 Segmental and somatic dysfunction of lumbar region: Secondary | ICD-10-CM | POA: Diagnosis not present

## 2023-01-08 DIAGNOSIS — M9901 Segmental and somatic dysfunction of cervical region: Secondary | ICD-10-CM | POA: Diagnosis not present

## 2023-01-08 DIAGNOSIS — M542 Cervicalgia: Secondary | ICD-10-CM | POA: Diagnosis not present

## 2023-01-09 DIAGNOSIS — Z23 Encounter for immunization: Secondary | ICD-10-CM | POA: Diagnosis not present

## 2023-01-29 DIAGNOSIS — M9901 Segmental and somatic dysfunction of cervical region: Secondary | ICD-10-CM | POA: Diagnosis not present

## 2023-01-29 DIAGNOSIS — M9904 Segmental and somatic dysfunction of sacral region: Secondary | ICD-10-CM | POA: Diagnosis not present

## 2023-01-29 DIAGNOSIS — M9903 Segmental and somatic dysfunction of lumbar region: Secondary | ICD-10-CM | POA: Diagnosis not present

## 2023-02-02 ENCOUNTER — Encounter: Payer: Self-pay | Admitting: Family Medicine

## 2023-02-03 ENCOUNTER — Other Ambulatory Visit: Payer: Self-pay | Admitting: Family Medicine

## 2023-02-03 MED ORDER — DEXTROAMPHETAMINE SULFATE 10 MG PO TABS: 10.0000 mg | ORAL_TABLET | Freq: Every day | ORAL | 0 refills | Status: DC

## 2023-02-12 DIAGNOSIS — Z23 Encounter for immunization: Secondary | ICD-10-CM | POA: Diagnosis not present

## 2023-02-14 ENCOUNTER — Telehealth: Payer: Self-pay

## 2023-02-14 NOTE — Telephone Encounter (Signed)
Called pt was advised regarding VV appt been canceled, pt ask if she could do in person with [name], NP. Appt scheduled.

## 2023-02-19 ENCOUNTER — Encounter: Payer: Self-pay | Admitting: Internal Medicine

## 2023-02-19 ENCOUNTER — Ambulatory Visit (INDEPENDENT_AMBULATORY_CARE_PROVIDER_SITE_OTHER): Payer: Medicare Other | Admitting: Internal Medicine

## 2023-02-19 VITALS — BP 120/70 | HR 64 | Ht 65.0 in | Wt 171.6 lb

## 2023-02-19 DIAGNOSIS — E559 Vitamin D deficiency, unspecified: Secondary | ICD-10-CM

## 2023-02-19 DIAGNOSIS — E063 Autoimmune thyroiditis: Secondary | ICD-10-CM | POA: Diagnosis not present

## 2023-02-19 LAB — VITAMIN D 25 HYDROXY (VIT D DEFICIENCY, FRACTURES): VITD: 71.74 ng/mL (ref 30.00–100.00)

## 2023-02-19 LAB — T4, FREE: Free T4: 0.91 ng/dL (ref 0.60–1.60)

## 2023-02-19 LAB — TSH: TSH: 1.27 u[IU]/mL (ref 0.35–5.50)

## 2023-02-19 NOTE — Progress Notes (Signed)
Name: Shannon [name]  MRN/ DOB: [id number], 12/12/1950    Age/ Sex: 72 y.o., female    PCP: Bradd Canary, MD   Reason for Endocrinology Evaluation: Hypothyroid     Date of Initial Endocrinology Evaluation: 02/12/2021    HPI: Shannon [name] is a 72 y.o. female with a past medical history of Hypothyroidism, gluten intolerance,Asthma , ADHD. The patient presented for initial endocrinology clinic visit on 02/19/2023 for consultative assistance with her hypothyroid.   She has been diagnosed with hypothyroidism in 2005.   She was following with an Higher education careers adviser in [place] . She is on Tirosint 100 mcg daily at night AND Desiccated thyroid 7.5 mg daily, has been on this regimen for ~ 10 yrs   She has gluten intolerance but no celiac disease diagnosis  Historically her thyroid replacement therapy has been adjusted based on her symptoms, she had noted during the summer that she has depression and leg cramps as well as occasional sleeping issues and would decrease thyroid hormone replacement therapy during the summer season to half a tablet once a week and 1 tablet the rest of the week.  In the fall she tends to go up to taking 1 tablet daily and has noted improvement in her symptoms.  She is on Vitamin D replacement . Takes 4000 iu daily in the winter and 2000 iu daily in the summer   On her initial visit we stopped compound thyroid hormone 7.5 mcg and continued Tirosint 100 mcg with a TSH 0.09 uIU/mL    Anti-TPO Abs elevated 565.6 01/2022   No Fh of thyroid disease   Father with T1DM   SUBJECTIVE:    Today (02/19/23):  Shannon [name] is here for a follow up on hypothyroidism   She is s/p Perineorrhaphy, cystoscopy 05/13/2022   Weight stable  Denies palpitations Denies local neck swelling  Denies tremors  Anxiety is stable  Has chronic constipation , on mg citrate  Has chronic muscle cramps which is improving  Brain fog has been improving    She is on Vitamin D 4000  iu daily (takes 2000 IU during the summer months) Tirosint 88 mcg , 1 tablet daily     HISTORY:  Past Medical History:  Past Medical History:  Diagnosis Date   ADHD (attention deficit hyperactivity disorder), 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 Left torn meniscus Right arthritis   Lead exposure 03/02/2018   Lumbar degenerative disc disease 02/24/2017   MRI 2019 confirms multilevel change  Sciatica at times 2/2 this and periodic radiculopathy   Major depressive disorder 03/08/2013   Melanocytic nevi of trunk    Menopause    Neck pain on left side 09/10/2018   Suspect this is DDD and DJD - XR is pending Neurapraxic spasm to left trapezius   Neurofibroma 07/20/2019   Nuclear sclerotic cataract of both eyes 07/23/2012   OAB (overactive bladder) 08/05/2016   Osteopenia    PONV (postoperative nausea and vomiting)    Rosacea  Sciatica of left side 02/24/2017   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   By exam she has chronic tendinopathy of Glut Med   Urinary urgency 09/17/2020   Vertigo 04/12/2019   Vitamin D deficiency 02/28/2014   Past Surgical History:  Past Surgical History:  Procedure Laterality Date   ANTERIOR AND POSTERIOR REPAIR  05/13/2022   Procedure: ANTERIOR (CYSTOCELE) AND POSTERIOR REPAIR (RECTOCELE), PERINEORRHAPHY;  Surgeon: Marguerita Beards, MD;  Location: [facility];  Service: Gynecology;;   blephoroplasty     BROW LIFT     CYSTOSCOPY N/A 05/13/2022   Procedure: CYSTOSCOPY;  Surgeon: Marguerita Beards, MD;   Location: [facility];  Service: Gynecology;  Laterality: N/A;   DILATION AND CURETTAGE OF UTERUS     SKIN CANCER EXCISION     right lower lip   TONSILLECTOMY AND ADENOIDECTOMY      Social History:  reports that she quit smoking about 52 years ago. Her smoking use included cigarettes. She has never used smokeless tobacco. She reports current alcohol use of about 3.0 - 4.0 standard drinks of alcohol per week. She reports that she does not use drugs. Family History: family history includes ADD / ADHD in her daughter and son; Alcohol abuse in her sister; Anxiety disorder in her brother, daughter, and son; Cancer in her sister and sister; Colon cancer in her maternal grandfather; Colon polyps in her mother; Depression in her daughter and son; Diabetes in her father; Emphysema in her father; Parkinson's disease in her maternal grandmother and mother; Tuberculosis in her father.   HOME MEDICATIONS: Allergies as of 02/19/2023       Reactions   Gluten Meal Other (See Comments)   GI symptoms; has BM everytime she urinates GI symptoms; has BM everytime she urinates        Medication List        Accurate as of February 19, 2023  3:33 PM. If you have any questions, ask your nurse or doctor.          Acidophilus Lactobacillus 10 BIL UNT/GM Powd Take 1 capsule by mouth daily.   Cholecalciferol 25 MCG (1000 UT) tablet Take by mouth.   dextroamphetamine 10 MG tablet Commonly known as: DEXTROSTAT Take 1 tablet (10 mg total) by mouth daily. Vacation fill   escitalopram 10 MG tablet Commonly known as: LEXAPRO Take 1.5 tablets (15 mg total) by mouth daily.   estradiol 0.1 MG/GM vaginal cream Commonly known as: ESTRACE VAGINAL Insert 1 applicatorful  vaginally twice a week at bedtime   gabapentin 300 MG capsule Commonly known as: NEURONTIN Take 1 capsule (300 mg total) by mouth 3 (three) times daily.   GAMMA AMINOBUTYRIC ACID PO Take 200 mg by mouth as needed. Up to  bid   hydrocortisone 2.5 % rectal cream Commonly known as: ANUSOL-HC Place 1 Application rectally 2 (two) times daily.   Krill Oil 1000 MG Caps   LORazepam 2 MG tablet Commonly known as: ATIVAN Take 1 tablet (2 mg total) by mouth every 8 (eight) hours as needed for anxiety.   MAGNESIUM GLYCINATE PLUS PO Take 100 mg by mouth 2 (two) times daily.   nystatin ointment Commonly known as: MYCOSTATIN   PEPTINEX DT/PREBIOTICS PO Take by mouth.   polyethylene glycol powder 17 GM/SCOOP powder Commonly known as: GLYCOLAX/MIRALAX Take 17 g by mouth daily. Drink 17g (1 scoop) dissolved in water per day.   ProAir RespiClick 108 (90 Base) MCG/ACT Aepb Generic drug: Albuterol Sulfate  Inhale 2 puffs every 4-6 hours as needed for cough and shortness of breath.   Tirosint 88 MCG Caps Generic drug: Levothyroxine Sodium TAKE ONE Capsule BY MOUTH EVERY DAY BEFORE BREAKFAST   tretinoin 0.025 % cream Commonly known as: RETIN-A Apply 0.025 % topically daily. Pea size amount          REVIEW OF SYSTEMS: A comprehensive ROS was conducted with the patient and is negative except as per HPI     OBJECTIVE:  VS: BP 120/70 (BP Location: Left Arm, Patient Position: Sitting, Cuff Size: Small)   Pulse 64   Ht 5\' 5"  (1.651 m)   Wt 171 lb 9.6 oz (77.8 kg)   LMP 03/29/2004   SpO2 98%   BMI 28.56 kg/m    Wt Readings from Last 3 Encounters:  02/19/23 171 lb 9.6 oz (77.8 kg)  09/02/22 171 lb 9.6 oz (77.8 kg)  08/20/22 169 lb (76.7 kg)     EXAM: General: Pt appears well and is in NAD  Neck: General: Supple without adenopathy. Thyroid: Thyroid size normal.  No goiter or nodules appreciated. No thyroid bruit.  Lungs: Clear with good BS bilat   Heart: Auscultation: RRR.  Abdomen: soft, nontender  Extremities:  BL LE: No pretibial edema normal ROM and strength.  Mental Status: Judgment, insight: Intact Orientation: Oriented to time, place, and person Mood and affect: No depression,  anxiety, or agitation     DATA REVIEWED:   Latest Reference Range & Units 02/19/23 10:52  VITD 30.00 - 100.00 ng/mL 71.74     Latest Reference Range & Units 02/19/23 10:52  TSH 0.35 - 5.50 uIU/mL 1.27  T4,Free(Direct) 0.60 - 1.60 ng/dL 2.84    13/05/4399 ATI-TPO Ab s 565.6  (reference <100)   ASSESSMENT/PLAN/RECOMMENDATIONS:   Hashimoto's Thyroiditis :  -Patient is clinically euthyroid -No local neck symptoms -TSH within normal range  Medications : Continue Tirosint 88 mcg daily  2. Hx vitamin D deficiency:  -Vitamin D continues to be within the normal range -She alternates vitamin dose during summer and winter months -Currently  is on 4000 IU daily, no change  Follow-up in 6 months    Signed electronically by: Lyndle Herrlich, MD  [facility]  [facility] [address] [place], [place] [address] Phone: [phone] FAX: [phone]   CC: Bradd Canary, MD [address] [place] [address] Phone: [phone] Fax: [phone]   Return to Endocrinology clinic as below: Future Appointments  Date Time Provider Department Center  03/06/2023 10:40 AM Olive Bass, FNP [facility]  08/20/2023  9:10 AM Darsi Tien, Konrad Dolores, MD [facility] None

## 2023-02-19 NOTE — Patient Instructions (Signed)

## 2023-02-23 ENCOUNTER — Encounter: Payer: Self-pay | Admitting: Family Medicine

## 2023-02-24 ENCOUNTER — Other Ambulatory Visit: Payer: Self-pay | Admitting: Family Medicine

## 2023-02-24 DIAGNOSIS — R350 Frequency of micturition: Secondary | ICD-10-CM

## 2023-02-25 ENCOUNTER — Other Ambulatory Visit (INDEPENDENT_AMBULATORY_CARE_PROVIDER_SITE_OTHER): Payer: Medicare Other

## 2023-02-25 DIAGNOSIS — M4120 Other idiopathic scoliosis, site unspecified: Secondary | ICD-10-CM | POA: Diagnosis not present

## 2023-02-25 DIAGNOSIS — M5416 Radiculopathy, lumbar region: Secondary | ICD-10-CM | POA: Diagnosis not present

## 2023-02-25 DIAGNOSIS — R350 Frequency of micturition: Secondary | ICD-10-CM

## 2023-02-25 LAB — URINALYSIS, ROUTINE W REFLEX MICROSCOPIC
Bilirubin Urine: NEGATIVE
Hgb urine dipstick: NEGATIVE
Ketones, ur: NEGATIVE
Leukocytes,Ua: NEGATIVE
Nitrite: NEGATIVE
RBC / HPF: NONE SEEN (ref 0–?)
Specific Gravity, Urine: 1.01 (ref 1.000–1.030)
Total Protein, Urine: NEGATIVE
Urine Glucose: NEGATIVE
Urobilinogen, UA: 0.2 (ref 0.0–1.0)
WBC, UA: NONE SEEN (ref 0–?)
pH: 7 (ref 5.0–8.0)

## 2023-02-26 LAB — URINE CULTURE
MICRO NUMBER:: 15657605
Result:: NO GROWTH
SPECIMEN QUALITY:: ADEQUATE

## 2023-02-27 DIAGNOSIS — M542 Cervicalgia: Secondary | ICD-10-CM | POA: Diagnosis not present

## 2023-02-27 DIAGNOSIS — M5441 Lumbago with sciatica, right side: Secondary | ICD-10-CM | POA: Diagnosis not present

## 2023-02-27 NOTE — Telephone Encounter (Signed)
While reviewing [name] schedule for next week, I noticed  that a patient of Dr. Abner Greenspan was scheduled for an office visit with note stating it was for a CPE. Please confirm that [name], NP approved the addition of an extra CPE to her schedule or if patient simply needs an OV. If patient is to just have an OV, what is purpose of OV. Please advise.

## 2023-03-06 ENCOUNTER — Ambulatory Visit: Payer: Medicare Other | Admitting: Family

## 2023-03-06 DIAGNOSIS — M542 Cervicalgia: Secondary | ICD-10-CM | POA: Diagnosis not present

## 2023-03-06 DIAGNOSIS — M5441 Lumbago with sciatica, right side: Secondary | ICD-10-CM | POA: Diagnosis not present

## 2023-03-07 ENCOUNTER — Encounter: Payer: Self-pay | Admitting: Family Medicine

## 2023-03-07 NOTE — Telephone Encounter (Signed)
Requesting: Dextrostat 10mg   Contract:09/02/22 UDS:09/02/22 Last Visit: 09/02/22 Next Visit: 03/24/23 Last Refill: 02/03/23 #30 and 0RF    Please Advise

## 2023-03-08 ENCOUNTER — Other Ambulatory Visit: Payer: Self-pay | Admitting: Family

## 2023-03-08 MED ORDER — DEXTROAMPHETAMINE SULFATE 10 MG PO TABS: 10.0000 mg | ORAL_TABLET | Freq: Every day | ORAL | 0 refills | Status: DC

## 2023-03-17 ENCOUNTER — Ambulatory Visit: Payer: Medicare Other | Admitting: Family Medicine

## 2023-03-19 DIAGNOSIS — C44319 Basal cell carcinoma of skin of other parts of face: Secondary | ICD-10-CM | POA: Diagnosis not present

## 2023-03-19 DIAGNOSIS — D2261 Melanocytic nevi of right upper limb, including shoulder: Secondary | ICD-10-CM | POA: Diagnosis not present

## 2023-03-19 DIAGNOSIS — Z86018 Personal history of other benign neoplasm: Secondary | ICD-10-CM | POA: Diagnosis not present

## 2023-03-19 DIAGNOSIS — D2272 Melanocytic nevi of left lower limb, including hip: Secondary | ICD-10-CM | POA: Diagnosis not present

## 2023-03-19 DIAGNOSIS — D2271 Melanocytic nevi of right lower limb, including hip: Secondary | ICD-10-CM | POA: Diagnosis not present

## 2023-03-19 DIAGNOSIS — D2371 Other benign neoplasm of skin of right lower limb, including hip: Secondary | ICD-10-CM | POA: Diagnosis not present

## 2023-03-19 DIAGNOSIS — L570 Actinic keratosis: Secondary | ICD-10-CM | POA: Diagnosis not present

## 2023-03-19 DIAGNOSIS — L821 Other seborrheic keratosis: Secondary | ICD-10-CM | POA: Diagnosis not present

## 2023-03-19 DIAGNOSIS — D2361 Other benign neoplasm of skin of right upper limb, including shoulder: Secondary | ICD-10-CM | POA: Diagnosis not present

## 2023-03-19 DIAGNOSIS — D225 Melanocytic nevi of trunk: Secondary | ICD-10-CM | POA: Diagnosis not present

## 2023-03-19 DIAGNOSIS — D485 Neoplasm of uncertain behavior of skin: Secondary | ICD-10-CM | POA: Diagnosis not present

## 2023-03-19 DIAGNOSIS — L578 Other skin changes due to chronic exposure to nonionizing radiation: Secondary | ICD-10-CM | POA: Diagnosis not present

## 2023-03-19 DIAGNOSIS — Z86008 Personal history of in-situ neoplasm of other site: Secondary | ICD-10-CM | POA: Diagnosis not present

## 2023-03-20 DIAGNOSIS — M542 Cervicalgia: Secondary | ICD-10-CM | POA: Diagnosis not present

## 2023-03-20 DIAGNOSIS — M5441 Lumbago with sciatica, right side: Secondary | ICD-10-CM | POA: Diagnosis not present

## 2023-03-23 NOTE — Assessment & Plan Note (Signed)
On Levothyroxine, continue to monitor 

## 2023-03-23 NOTE — Assessment & Plan Note (Signed)
Supplement and monitor 

## 2023-03-23 NOTE — Assessment & Plan Note (Signed)
Encourage heart healthy diet such as MIND or DASH diet, increase exercise, avoid trans fats, simple carbohydrates and processed foods, consider a krill or fish or flaxseed oil cap daily.  °

## 2023-03-23 NOTE — Assessment & Plan Note (Signed)
hgba1c acceptable, minimize simple carbs. Increase exercise as tolerated.  

## 2023-03-23 NOTE — Assessment & Plan Note (Signed)
Stable on current meds no changes

## 2023-03-24 ENCOUNTER — Telehealth: Payer: Self-pay | Admitting: Emergency Medicine

## 2023-03-24 ENCOUNTER — Telehealth (INDEPENDENT_AMBULATORY_CARE_PROVIDER_SITE_OTHER): Payer: Medicare Other | Admitting: Family Medicine

## 2023-03-24 DIAGNOSIS — R32 Unspecified urinary incontinence: Secondary | ICD-10-CM

## 2023-03-24 DIAGNOSIS — E782 Mixed hyperlipidemia: Secondary | ICD-10-CM

## 2023-03-24 DIAGNOSIS — R194 Change in bowel habit: Secondary | ICD-10-CM | POA: Diagnosis not present

## 2023-03-24 DIAGNOSIS — F900 Attention-deficit hyperactivity disorder, predominantly inattentive type: Secondary | ICD-10-CM | POA: Diagnosis not present

## 2023-03-24 DIAGNOSIS — K5900 Constipation, unspecified: Secondary | ICD-10-CM

## 2023-03-24 DIAGNOSIS — R739 Hyperglycemia, unspecified: Secondary | ICD-10-CM | POA: Diagnosis not present

## 2023-03-24 DIAGNOSIS — E559 Vitamin D deficiency, unspecified: Secondary | ICD-10-CM | POA: Diagnosis not present

## 2023-03-24 DIAGNOSIS — E039 Hypothyroidism, unspecified: Secondary | ICD-10-CM | POA: Diagnosis not present

## 2023-03-24 MED ORDER — GABAPENTIN 300 MG PO CAPS: 300.0000 mg | ORAL_CAPSULE | Freq: Three times a day (TID) | ORAL | Status: AC | PRN

## 2023-03-24 NOTE — Progress Notes (Signed)
MyChart Video Visit    Virtual Visit via Video Note   This patient is at least at moderate risk for complications without adequate follow up. This format is felt to be most appropriate for this patient at this time. Physical exam was limited by quality of the video and audio technology used for the visit. Juanetta, [name] was able to get the patient set up on a video visit.  Patient location: home Patient and provider in visit Provider location: Office  I discussed the limitations of evaluation and management by telemedicine and the availability of in person appointments. The patient expressed understanding and agreed to proceed.  Visit Date: 03/24/2023  Today's healthcare provider: Danise Edge, MD     Subjective:    Patient ID: Shannon [name], female    DOB: Ayen 20, 1952, 72 y.o.   MRN: [id number]  No chief complaint on file.   HPI Discussed the use of AI scribe software for clinical note transcription with the patient, who gave verbal consent to proceed.  History of Present Illness   The patient, with a history of stenosis and rectal surgery, presents with worsening nerve symptoms in the legs, extending down to the ankles. The symptoms are managed with exercise, chiropractic care, physical therapy, and occasional use of gabapentin. The patient also reports increased urinary incontinence since the rectal surgery, despite pelvic physical therapy and strength training. The incontinence is not related to urgency but occurs spontaneously, even during walking. The patient uses incontinence underwear and pads for management.  The patient also reports bowel irregularities, with a feeling of incomplete bowel movements in the morning, requiring straining or pressure on the perineum for relief. Later in the day, more substantial bowel movements occur. The patient has been managing constipation with magnesium citrate taken nightly. The patient has a history of a long and tortuous colon, which Azaya  contribute to the bowel irregularities.        Past Medical History:  Diagnosis Date  . ADHD (attention deficit hyperactivity disorder), 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  . Anxiety   . Asthma   . Basal cell carcinoma of face   . Benign neoplasm of skin of lower limb   . Bilateral carotid bruits 04/12/2019  . Diverticulosis   . Gestational diabetes   . Hammer toe, acquired 03/15/2009  . Heart murmur 03/02/2018  . History of COVID-19 09/14/2020  . Hyperglycemia 02/24/2017  . Hyperlipidemia, mixed 03/11/2016  . Hypothyroidism   . Knee pain 09/26/2010   Crepitation on the right and minimal on the left. Left torn meniscus Right arthritis  . Lead exposure 03/02/2018  . Lumbar degenerative disc disease 02/24/2017   MRI 2019 confirms multilevel change  Sciatica at times 2/2 this and periodic radiculopathy  . Major depressive disorder 03/08/2013  . Melanocytic nevi of trunk   . Menopause   . Neck pain on left side 09/10/2018   Suspect this is DDD and DJD - XR is pending Neurapraxic spasm to left trapezius  . Neurofibroma 07/20/2019  . Nuclear sclerotic cataract of both eyes 07/23/2012  . OAB (overactive bladder) 08/05/2016  . Osteopenia   . PONV (postoperative nausea and vomiting)   . Rosacea   . Sciatica of left side 02/24/2017  . 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   By exam she has chronic tendinopathy of Glut Med  . Urinary urgency 09/17/2020  . Vertigo 04/12/2019  . Vitamin D deficiency 02/28/2014    Past Surgical History:  Procedure Laterality Date  . ANTERIOR  AND POSTERIOR REPAIR  05/13/2022   Procedure: ANTERIOR (CYSTOCELE) AND POSTERIOR REPAIR (RECTOCELE), PERINEORRHAPHY;  Surgeon: Marguerita Beards, MD;  Location: [facility];  Service: Gynecology;;  . blephoroplasty    . BROW LIFT    . CYSTOSCOPY N/A 05/13/2022   Procedure: CYSTOSCOPY;  Surgeon: Marguerita Beards, MD;  Location: [facility];  Service: Gynecology;   Laterality: N/A;  . DILATION AND CURETTAGE OF UTERUS    . SKIN CANCER EXCISION     right lower lip  . TONSILLECTOMY AND ADENOIDECTOMY      Family History  Problem Relation Age of Onset  . Colon polyps Mother   . Parkinson's disease Mother        developed Parkinson's disease dementia  . Diabetes Father   . Emphysema Father   . Tuberculosis Father   . Alcohol abuse Sister   . Cancer Sister        skin cancer, melanoma cell, basil cell  . Cancer Sister        melanoma  . Anxiety disorder Brother   . Parkinson's disease Maternal Grandmother        developed Parkinson's disease dementia  . Colon cancer Maternal Grandfather   . ADD / ADHD Daughter   . Anxiety disorder Daughter   . Depression Daughter   . ADD / ADHD Son   . Anxiety disorder Son   . Depression Son     Social History   Socioeconomic History  . Marital status: Single    Spouse name: Not on file  . Number of children: 2  . Years of education: 55  . Highest education level: Master's degree (e.g., MA, MS, MEng, MEd, MSW, MBA)  Occupational History  . Occupation: Retired    Comment: Early Personal assistant  Tobacco Use  . Smoking status: Former    Current packs/day: 0.00    Types: Cigarettes    Quit date: 08/20/1970    Years since quitting: 52.6  . Smokeless tobacco: Never  Vaping Use  . Vaping status: Never Used  Substance and Sexual Activity  . Alcohol use: Yes    Alcohol/week: 3.0 - 4.0 standard drinks of alcohol    Types: 1 - 2 Glasses of wine, 1 Cans of beer, 1 Shots of liquor per week    Comment: not every night.  . Drug use: No  . Sexual activity: Not Currently  Other Topics Concern  . Not on file  Social History Narrative   Retired from [employer].   No major dietary restrictions except avoid gluten.       Social Determinants of Health   Financial Resource Strain: Low Risk  (02/27/2023)   Overall Financial Resource Strain (CARDIA)   . Difficulty of Paying Living Expenses: Not  hard at all  Food Insecurity: No Food Insecurity (02/27/2023)   Hunger Vital Sign   . Worried About Programme researcher, broadcasting/film/video in the Last Year: Never true   . Ran Out of Food in the Last Year: Never true  Transportation Needs: No Transportation Needs (02/27/2023)   PRAPARE - Transportation   . Lack of Transportation (Medical): No   . Lack of Transportation (Non-Medical): No  Physical Activity: Insufficiently Active (02/27/2023)   Exercise Vital Sign   . Days of Exercise per Week: 1 day   . Minutes of Exercise per Session: 30 min  Stress: Stress Concern Present (02/27/2023)   [employer] of Occupational Health - Occupational Stress Questionnaire   . Feeling  of Stress : To some extent  Social Connections: Moderately Integrated (02/27/2023)   Social Connection and Isolation Panel [NHANES]   . Frequency of Communication with Friends and Family: More than three times a week   . Frequency of Social Gatherings with Friends and Family: Twice a week   . Attends Religious Services: More than 4 times per year   . Active Member of Clubs or Organizations: Yes   . Attends Banker Meetings: More than 4 times per year   . Marital Status: Divorced  Catering manager Violence: Not At Risk (08/01/2022)   Humiliation, Afraid, Rape, and Kick questionnaire   . Fear of Current or Ex-Partner: No   . Emotionally Abused: No   . Physically Abused: No   . Sexually Abused: No    Outpatient Medications Prior to Visit  Medication Sig Dispense Refill  . Albuterol Sulfate (PROAIR RESPICLICK) 108 (90 Base) MCG/ACT AEPB Inhale 2 puffs every 4-6 hours as needed for cough and shortness of breath. 1 each 0  . dextroamphetamine (DEXTROSTAT) 10 MG tablet Take 1 tablet (10 mg total) by mouth daily. Vacation fill 30 tablet 0  . estradiol (ESTRACE VAGINAL) 0.1 MG/GM vaginal cream Insert 1 applicatorful  vaginally twice a week at bedtime 42.5 g 1  . GAMMA AMINOBUTYRIC ACID PO Take 200 mg by mouth as needed. Up to  bid    . Krill Oil 1000 MG CAPS     . Lactobacillus Acidophilus (ACIDOPHILUS LACTOBACILLUS) 10 BIL UNT/GM POWD Take 1 capsule by mouth daily.    [name] LORazepam (ATIVAN) 2 MG tablet Take 1 tablet (2 mg total) by mouth every 8 (eight) hours as needed for anxiety. 40 tablet 1  . MAGNESIUM GLYCINATE PLUS PO Take 100 mg by mouth 2 (two) times daily.    . Nutritional Supplements (PEPTINEX DT/PREBIOTICS PO) Take by mouth.    . nystatin ointment (MYCOSTATIN)     . TIROSINT 88 MCG CAPS TAKE ONE Capsule BY MOUTH EVERY DAY BEFORE BREAKFAST 90 capsule 1  . tretinoin (RETIN-A) 0.025 % cream Apply 0.025 % topically daily. Pea size amount    . Cholecalciferol 25 MCG (1000 UT) tablet Take by mouth.    . escitalopram (LEXAPRO) 10 MG tablet Take 1.5 tablets (15 mg total) by mouth daily. 135 tablet 1  . gabapentin (NEURONTIN) 300 MG capsule Take 1 capsule (300 mg total) by mouth 3 (three) times daily. 90 capsule 1  . hydrocortisone (ANUSOL-HC) 2.5 % rectal cream Place 1 Application rectally 2 (two) times daily. 30 g 0  . polyethylene glycol powder (GLYCOLAX/MIRALAX) 17 GM/SCOOP powder Take 17 g by mouth daily. Drink 17g (1 scoop) dissolved in water per day. 255 g 0   No facility-administered medications prior to visit.    Allergies  Allergen Reactions  . Gluten Meal Other (See Comments)    GI symptoms; has BM everytime she urinates GI symptoms; has BM everytime she urinates    Review of Systems  Constitutional:  Negative for fever and malaise/fatigue.  HENT:  Negative for congestion.   Eyes:  Negative for blurred vision.  Respiratory:  Negative for shortness of breath.   Cardiovascular:  Negative for chest pain, palpitations and leg swelling.  Gastrointestinal:  Positive for constipation. Negative for abdominal pain, blood in stool and nausea.  Genitourinary:  Positive for frequency and urgency. Negative for dysuria.  Musculoskeletal:  Negative for falls.  Skin:  Negative for rash.  Neurological:   Negative for dizziness, loss of consciousness and  headaches.  Endo/Heme/Allergies:  Negative for environmental allergies.  Psychiatric/Behavioral:  Negative for depression. The patient is not nervous/anxious.       Objective:    Physical Exam Constitutional:      General: She is not in acute distress.    Appearance: Normal appearance. She is not ill-appearing or toxic-appearing.  HENT:     Head: Normocephalic and atraumatic.     Right Ear: External ear normal.     Left Ear: External ear normal.     Nose: Nose normal.  Eyes:     General:        Right eye: No discharge.        Left eye: No discharge.  Pulmonary:     Effort: Pulmonary effort is normal.  Skin:    Findings: No rash.  Neurological:     Mental Status: She is alert and oriented to person, place, and time.  Psychiatric:        Behavior: Behavior normal.   LMP 03/29/2004  Wt Readings from Last 3 Encounters:  02/19/23 171 lb 9.6 oz (77.8 kg)  09/02/22 171 lb 9.6 oz (77.8 kg)  08/20/22 169 lb (76.7 kg)       Assessment & Plan:  ADHD (attention deficit hyperactivity disorder), inattentive type Assessment & Plan: Stable on current meds no changes   Hyperglycemia Assessment & Plan: hgba1c acceptable, minimize simple carbs. Increase exercise as tolerated.   Orders: -     Comprehensive metabolic panel; Future  Hyperlipidemia, mixed Assessment & Plan: Encourage heart healthy diet such as MIND or DASH diet, increase exercise, avoid trans fats, simple carbohydrates and processed foods, consider a krill or fish or flaxseed oil cap daily.    Orders: -     Lipid panel; Future  Hypothyroidism, unspecified type Assessment & Plan: On Levothyroxine, continue to monitor    Vitamin D deficiency Assessment & Plan: Supplement and monitor    Urinary incontinence, unspecified type -     CBC with Differential/Platelet; Future  Constipation, unspecified constipation type -     Ambulatory referral to  Gastroenterology; Future  Change in bowel habit  Other orders -     Gabapentin; Take 1 capsule (300 mg total) by mouth 3 (three) times daily as needed.     Assessment and Plan    Spinal Stenosis Symptoms managed with exercise, physical therapy, chiropractic care, and occasional use of Gabapentin. -Change Gabapentin prescription to 3 times a day as needed.  Urinary Incontinence Increased severity post rectal cell surgery. Pelvic PT completed with no improvement. No urinary urgency, but sudden leakage noted. -Schedule appointment with surgeon to discuss possible surgical complications and further management. -Continue use of incontinence pads and underwear.  Constipation Managed with daily Magnesium Citrate. History of long and tortuous colon. Bowel movements present but incomplete. -Trial of Senna suggested, starting with one pill daily and increasing to two if needed.  Colon Cancer Screening Family history of colon cancer. Last colonoscopy 7 years ago with clear results. -Schedule colonoscopy due to persistent constipation and change in bowel habits.  General Health Maintenance -Order routine labs including kidney and liver function, blood count, and cholesterol. -Encouraged to receive annual flu and COVID-19 boosters. -Follow-up appointment scheduled for early Adair 2025.         I discussed the assessment and treatment plan with the patient. The patient was provided an opportunity to ask questions and all were answered. The patient agreed with the plan and demonstrated an understanding of the instructions.  The patient was advised to call back or seek an in-person evaluation if the symptoms worsen or if the condition fails to improve as anticipated.  Danise Edge, MD [facility] Primary Care at [facility] [phone] (phone) [phone] (fax)  [facility]

## 2023-03-24 NOTE — Telephone Encounter (Signed)
Called patient to schedule follow up appointment. No answer. Left voicemail.

## 2023-03-31 ENCOUNTER — Encounter: Payer: Self-pay | Admitting: Pediatrics

## 2023-04-02 ENCOUNTER — Other Ambulatory Visit: Payer: Self-pay | Admitting: Internal Medicine

## 2023-04-03 ENCOUNTER — Other Ambulatory Visit (INDEPENDENT_AMBULATORY_CARE_PROVIDER_SITE_OTHER): Payer: Medicare Other

## 2023-04-03 DIAGNOSIS — R32 Unspecified urinary incontinence: Secondary | ICD-10-CM | POA: Diagnosis not present

## 2023-04-03 DIAGNOSIS — R739 Hyperglycemia, unspecified: Secondary | ICD-10-CM

## 2023-04-03 DIAGNOSIS — K5900 Constipation, unspecified: Secondary | ICD-10-CM

## 2023-04-03 DIAGNOSIS — E782 Mixed hyperlipidemia: Secondary | ICD-10-CM | POA: Diagnosis not present

## 2023-04-03 LAB — COMPREHENSIVE METABOLIC PANEL
ALT: 16 U/L (ref 0–35)
AST: 22 U/L (ref 0–37)
Albumin: 4.2 g/dL (ref 3.5–5.2)
Alkaline Phosphatase: 63 U/L (ref 39–117)
BUN: 23 mg/dL (ref 6–23)
CO2: 29 meq/L (ref 19–32)
Calcium: 9 mg/dL (ref 8.4–10.5)
Chloride: 102 meq/L (ref 96–112)
Creatinine, Ser: 0.9 mg/dL (ref 0.40–1.20)
GFR: 63.85 mL/min (ref >60.00)
Glucose, Bld: 96 mg/dL (ref 70–99)
Potassium: 4.2 meq/L (ref 3.5–5.1)
Sodium: 138 meq/L (ref 135–145)
Total Bilirubin: 1.1 mg/dL (ref 0.2–1.2)
Total Protein: 6.7 g/dL (ref 6.0–8.3)

## 2023-04-03 LAB — CBC WITH DIFFERENTIAL/PLATELET
Basophils Absolute: 0 10*3/uL (ref 0.0–0.1)
Basophils Relative: 0.9 % (ref 0.0–3.0)
Eosinophils Absolute: 0.1 10*3/uL (ref 0.0–0.7)
Eosinophils Relative: 2.4 % (ref 0.0–5.0)
HCT: 39.7 % (ref 36.0–46.0)
Hemoglobin: 13.3 g/dL (ref 12.0–15.0)
Lymphocytes Relative: 32.3 % (ref 12.0–46.0)
Lymphs Abs: 1.4 10*3/uL (ref 0.7–4.0)
MCHC: 33.4 g/dL (ref 30.0–36.0)
MCV: 99.2 fL (ref 78.0–100.0)
Monocytes Absolute: 0.4 10*3/uL (ref 0.1–1.0)
Monocytes Relative: 8.7 % (ref 3.0–12.0)
Neutro Abs: 2.5 10*3/uL (ref 1.4–7.7)
Neutrophils Relative %: 55.7 % (ref 43.0–77.0)
Platelets: 230 10*3/uL (ref 150.0–400.0)
RBC: 4 Mil/uL (ref 3.87–5.11)
RDW: 11.7 % (ref 11.5–15.5)
WBC: 4.4 10*3/uL (ref 4.0–10.5)

## 2023-04-03 LAB — LIPID PANEL
Cholesterol: 191 mg/dL (ref 0–200)
HDL: 51.3 mg/dL (ref >39.00)
LDL Cholesterol: 125 mg/dL — ABNORMAL HIGH (ref 0–99)
NonHDL: 139.45
Total CHOL/HDL Ratio: 4
Triglycerides: 71 mg/dL (ref 0.0–149.0)
VLDL: 14.2 mg/dL (ref 0.0–40.0)

## 2023-04-06 ENCOUNTER — Encounter: Payer: Self-pay | Admitting: Family Medicine

## 2023-04-07 ENCOUNTER — Encounter: Payer: Self-pay | Admitting: Family Medicine

## 2023-04-07 ENCOUNTER — Other Ambulatory Visit: Payer: Self-pay | Admitting: Family

## 2023-04-07 ENCOUNTER — Other Ambulatory Visit: Payer: Self-pay | Admitting: Emergency Medicine

## 2023-04-07 ENCOUNTER — Other Ambulatory Visit: Payer: Self-pay | Admitting: Family Medicine

## 2023-04-07 MED ORDER — DEXTROAMPHETAMINE SULFATE 10 MG PO TABS: 10.0000 mg | ORAL_TABLET | Freq: Every day | ORAL | 0 refills | Status: DC

## 2023-04-07 NOTE — Telephone Encounter (Signed)
Requesting: Dextroamphetamine 10 mg Tablet Contract: 09/02/2022 UDS: 09/02/2022 Last Visit:09/02/2022 Next Visit: 08/28/2023 Last Refill: 03/08/2023  Please Advise   Would you please call in a refill for Dextroamphetamine 10mg  to [facility]?

## 2023-04-09 ENCOUNTER — Other Ambulatory Visit: Payer: Self-pay | Admitting: Family

## 2023-04-09 MED ORDER — DEXTROAMPHETAMINE SULFATE 10 MG PO TABS: 10.0000 mg | ORAL_TABLET | Freq: Every day | ORAL | 0 refills | Status: DC

## 2023-04-10 DIAGNOSIS — M5441 Lumbago with sciatica, right side: Secondary | ICD-10-CM | POA: Diagnosis not present

## 2023-04-10 DIAGNOSIS — M542 Cervicalgia: Secondary | ICD-10-CM | POA: Diagnosis not present

## 2023-05-06 ENCOUNTER — Encounter: Payer: Self-pay | Admitting: Family Medicine

## 2023-05-06 ENCOUNTER — Ambulatory Visit: Payer: Medicare Other | Admitting: Obstetrics and Gynecology

## 2023-05-07 ENCOUNTER — Other Ambulatory Visit: Payer: Self-pay | Admitting: Family Medicine

## 2023-05-07 MED ORDER — DEXTROAMPHETAMINE SULFATE 10 MG PO TABS: 10.0000 mg | ORAL_TABLET | Freq: Every day | ORAL | 0 refills | Status: DC

## 2023-05-07 NOTE — Telephone Encounter (Signed)
 Please send refill prescription for Dextroamphetamine 10mg  to [facility].   Requesting: Dextroamphetamine 10 mg Contract: 09/02/2022 UDS: 09/02/2022 Last Visit: 09/02/2022 Next Visit: 08/28/2023 Last Refill: 04/09/2023  Please Advise

## 2023-05-08 ENCOUNTER — Ambulatory Visit: Payer: Medicare Other | Admitting: Obstetrics and Gynecology

## 2023-05-08 DIAGNOSIS — M5441 Lumbago with sciatica, right side: Secondary | ICD-10-CM | POA: Diagnosis not present

## 2023-05-08 DIAGNOSIS — M542 Cervicalgia: Secondary | ICD-10-CM | POA: Diagnosis not present

## 2023-05-08 NOTE — Telephone Encounter (Signed)
 Called patient. No Answer. Called daughter Shannon [name]. She said she would try to reach her mother also. LVM

## 2023-05-12 ENCOUNTER — Ambulatory Visit (INDEPENDENT_AMBULATORY_CARE_PROVIDER_SITE_OTHER): Payer: Medicare Other | Admitting: Obstetrics and Gynecology

## 2023-05-12 ENCOUNTER — Encounter: Payer: Self-pay | Admitting: Obstetrics and Gynecology

## 2023-05-12 VITALS — BP 113/78 | HR 81

## 2023-05-12 DIAGNOSIS — N393 Stress incontinence (female) (male): Secondary | ICD-10-CM

## 2023-05-12 DIAGNOSIS — C44319 Basal cell carcinoma of skin of other parts of face: Secondary | ICD-10-CM | POA: Diagnosis not present

## 2023-05-12 DIAGNOSIS — L570 Actinic keratosis: Secondary | ICD-10-CM | POA: Diagnosis not present

## 2023-05-12 MED ORDER — SULFAMETHOXAZOLE-TRIMETHOPRIM 800-160 MG PO TABS: 1.0000 | ORAL_TABLET | Freq: Two times a day (BID) | ORAL | 0 refills | Status: AC

## 2023-05-12 NOTE — Progress Notes (Signed)
 [facility] Return Visit  SUBJECTIVE  History of Present Illness: Shannon [name] is a 73 y.o. female seen in follow-up for incontinence.    s/p Anterior and posterior repair with perineorrhaphy, cystoscopy on 05/13/22. Prior urodynamic testing did demonstrate SUI but she also had incomplete bladder emptying (PVR ).   She reports that she is still having incontinence with walking or activity or lifting. Not having a lot of urgency. Has more leakage after drinking alcohol. Has needed to increase her pad size.   Had some issues with bowel movements. Does not have to splint but sometimes presses on her perineum.   She did some pelvic PT with Rosalva Forest after the surgery.   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), Anxiety, Asthma, Basal cell carcinoma of face, Benign neoplasm of skin of lower limb, Bilateral carotid bruits (04/12/2019), Diverticulosis, Gestational diabetes, Hammer toe, acquired (03/15/2009), Heart murmur (03/02/2018), History of COVID-19 (09/14/2020), Hyperglycemia (02/24/2017), Hyperlipidemia, mixed (03/11/2016), Hypothyroidism, Knee pain (09/26/2010), Lead exposure (03/02/2018), Lumbar degenerative disc disease (02/24/2017), Major depressive disorder (03/08/2013), Melanocytic nevi of trunk, Menopause, Neck pain on left side (09/10/2018), Neurofibroma (07/20/2019), Nuclear sclerotic cataract of both eyes (07/23/2012), OAB (overactive bladder) (08/05/2016), Osteopenia, PONV (postoperative nausea and vomiting), Rosacea, Sciatica of left side (02/24/2017), Tendinopathy of left gluteus medius (04/01/2017), Urinary urgency (09/17/2020), Vertigo (04/12/2019), and Vitamin D  deficiency (02/28/2014).   Past Surgical History: She  has a past surgical history that includes Tonsillectomy and adenoidectomy; Dilation and curettage of uterus; blephoroplasty; Brow lift; Skin cancer  excision; Cystoscopy (N/A, 05/13/2022); and Anterior and posterior repair (05/13/2022).   Medications: She has a current medication list which includes the following prescription(s): proair  respiclick, dextroamphetamine , estradiol , gabapentin , gamma-aminobutyric acid, krill oil, acidophilus lactobacillus, lorazepam , magnesium, nutritional supplements, nystatin ointment, sulfamethoxazole -trimethoprim , tirosint , and tretinoin.   Allergies: Patient is allergic to gluten meal.   Social History: Patient  reports that she quit smoking about 52 years ago. Her smoking use included cigarettes. She has never used smokeless tobacco. She reports current alcohol use of about 3.0 - 4.0 standard drinks of alcohol per week. She reports that she does not use drugs.     OBJECTIVE     Physical Exam: Vitals:   05/12/23 1521  BP: 113/78  Pulse: 81   Gen: No apparent distress, A&O x 3.  Detailed Urogynecologic Evaluation:  Urethra was prepped with betadine  and straight catheter placed. PVR 1ml.   Normal external genitalia. On speculum, normal vaginal mucosa. On bimanual, no masses present.   POP-Q  -2.5                                            Aa   -2.5                                           Ba  -7.5                                              C   2  Gh  4                                            Pb  8                                            tvl   -2.5                                            Ap  -2.5                                            Bp                                                 D       ASSESSMENT AND PLAN    [name] is a 73 y.o. with:  1. SUI (stress urinary incontinence, female)     SUI (stress urinary incontinence, female) -     Sulfamethoxazole -Trimethoprim ; Take 1 tablet by mouth 2 (two) times daily for 1 day. Take day of procedure  Dispense: 2 tablet; Refill: 0   - Reviewed options of urethral bulking  or sling. She prefers an office procedure with urethral bulking (Bulkamid). We discussed success rate of approximately 70-80% and possible need for second injection. We reviewed that this is not a permanent procedure and the Bulkamid does dissolve over time. Risks reviewed including injury to bladder or urethra, UTI, urinary retention and hematuria.   Return for procedure  Shannon LOISE Caper, MD

## 2023-05-19 NOTE — Progress Notes (Unsigned)
[facility] Initial Consultation   Referring Provider Bradd Canary, MD [address] [place],  [place] [address]  Primary Care Provider Bradd Canary, MD  Patient Profile: Shannon [name] is a 73 y.o. female with a past medical history noteworthy for Hashimoto's thyroiditis, osteopenia, mild cognitive disorder, heart murmur, vaginal prolapse and rectocele status post repair who is seen in consultation in the [facility] at the request of Dr. Abner Greenspan for evaluation and management of the problem(s) noted below.  Problem List: Chronic constipation History of rectocele and vaginal prolapse s/p repair Family history of colorectal cancer in two second-degree relatives  History of Present Illness   Shannon [name] is a 73 y.o. female with a history of Hashimoto's thyroiditis, osteopenia, mild cognitive disorder, heart murmur, rectocele status post repair who presents to the office for evaluation and management of chronic constipation.  In speaking with Shannon [name] as well as reviewing her prior medical records she reports experiencing a change in bowel habits ~ 2001 She recalls developing symptoms of constipation and difficulty evacuating bowel movements Colonoscopy performed in 01/2020 documented a tortuous colon with normal tissue and biopsies negative for microscopic colitis She relates that she began eliminating wheat, dairy and sugar from her diet which ameliorated her symptoms Celiac disease testing around that time was negative but she points out that she was avoiding wheat  Shannon [name] relates that she was later diagnosed with Hashimoto's thyroiditis She sought care from an alternative endocrinologist who prescribed her levothyroxine + T3/T4 Her bowel movements improved while on this therapy and she began stooling more normally After her alternative endocrinologist retired, she established care with an osteopathic endocrinologist who advised discontinuing  T3/T4 Since cessation of T3/T4 she has noted worsening constipation  Shannon [name] has a pertinent history of vaginal prolapse and rectocele for which she underwent anterior and posterior repair with perineorrhaphy and cystoscopy 05/13/2022 She subsequently participated in pelvic floor physical therapy States she wants to ensure she avoids straining when defecating for fear of disrupting her surgical outcome. Continues to have some stress urinary incontinence  At today's visit she reports that she has used a variety of laxatives to encourage bowel movements:: Max, smooth move tea, magnesium glycinate and other over-the-counter regimens She is passing small bowel movements multiple times in the morning -describes what sounds to be incomplete evacuation Bowel movements can be normal or sometimes firm in nature Has noted that sometimes her stools seem thinner in caliber but that was also the case in the past when she had constipation No blood or mucus in her stool Denies encopresis  Shannon [name] reports a family history of colorectal cancer in a grandfather and a nephew who was recently diagnosed with advanced colon cancer at the age of 57 She has a sister with a history of breast cancer and another sister with a history of ovarian cancer  Last colonoscopy: 02/2016 - Normal Last endoscopy: None  Last Abd CT/CTE/MRE: None  GI Review of Symptoms Significant for constipation. Otherwise negative.  General Review of Systems  Review of systems is significant for the pertinent positives and negatives as listed per the HPI.  Full ROS is otherwise negative.  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 Left torn meniscus Right arthritis   Lead exposure 03/02/2018   Lumbar degenerative disc disease 02/24/2017   MRI 2019 confirms multilevel change  Sciatica at times 2/2 this and periodic radiculopathy   Major depressive disorder 03/08/2013   Melanocytic nevi of trunk    Menopause    Neck pain on left side 09/10/2018   Suspect this is DDD and DJD - XR is pending Neurapraxic spasm to left trapezius   Neurofibroma 07/20/2019   Nuclear sclerotic cataract of both eyes 07/23/2012   OAB (overactive bladder) 08/05/2016   Osteopenia    PONV (postoperative nausea and vomiting)    Rosacea    Sciatica of left side 02/24/2017   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   By exam she has chronic tendinopathy of Glut Med   Urinary urgency 09/17/2020   Vertigo 04/12/2019   Vitamin D deficiency 02/28/2014     Past Surgical History   Past Surgical History:  Procedure Laterality Date   ANTERIOR AND POSTERIOR REPAIR  05/13/2022   Procedure: ANTERIOR (CYSTOCELE) AND POSTERIOR REPAIR (RECTOCELE), PERINEORRHAPHY;  Surgeon: Marguerita Beards, MD;  Location: [facility];  Service: Gynecology;;   blephoroplasty     BROW LIFT     CYSTOSCOPY N/A 05/13/2022   Procedure: CYSTOSCOPY;  Surgeon: Marguerita Beards, MD;  Location: [facility];  Service: Gynecology;  Laterality: N/A;   DILATION AND CURETTAGE OF UTERUS     SKIN CANCER EXCISION     right lower lip   TONSILLECTOMY AND ADENOIDECTOMY       Allergies and Medications   Allergies  Allergen Reactions   Gluten Meal Other (See Comments)    GI symptoms; has BM everytime she urinates GI symptoms; has BM everytime she urinates    Current Meds  Medication Sig   dextroamphetamine  (DEXTROSTAT) 10 MG tablet Take 1 tablet (10 mg total) by mouth daily.   estradiol (ESTRACE) 0.1 MG/GM vaginal cream INSERT ONE APPLICATORFUL VAGINALLY TWICE A WEEK AT BEDTIME   gabapentin (NEURONTIN) 300 MG capsule Take 1 capsule (300 mg total) by mouth 3 (three) times daily as needed.   GAMMA AMINOBUTYRIC ACID PO Take 200 mg by mouth as needed. Up to bid   Krill Oil 1000 MG CAPS    Lactobacillus Acidophilus (ACIDOPHILUS LACTOBACILLUS) 10 BIL UNT/GM POWD Take 1 capsule by mouth daily.   LORazepam (ATIVAN) 2 MG tablet Take 1 tablet (2 mg total) by mouth every 8 (eight) hours as needed for anxiety.   Magnesium Citrate 125 MG CAPS Take by mouth as directed.   MAGNESIUM GLYCINATE PLUS PO Take 100 mg by mouth 2 (two) times daily.   Nutritional Supplements (PEPTINEX DT/PREBIOTICS PO) Take by mouth.   nystatin ointment (MYCOSTATIN)    TIROSINT 88 MCG CAPS TAKE ONE Capsule BY MOUTH EVERY DAY BEFORE BREAKFAST   tretinoin (RETIN-A) 0.025 % cream Apply 0.025 % topically daily. Pea size amount     Family History   Family History  Problem Relation Age of Onset   Colon polyps Mother    Parkinson's disease Mother        developed Parkinson's disease dementia  Diabetes Father    Emphysema Father    Tuberculosis Father    Alcohol abuse Sister    Cancer Sister        skin cancer, melanoma cell, basil cell   Cancer Sister        melanoma   Anxiety disorder Brother    Parkinson's disease Maternal Grandmother        developed Parkinson's disease dementia   Colon cancer Maternal Grandfather    ADD / ADHD Daughter    Anxiety disorder Daughter    Depression Daughter    ADD / ADHD Son    Anxiety disorder Son    Depression Son      Social History   Social History   Tobacco Use   Smoking status: Former    Current packs/day: 0.00    Types: Cigarettes    Quit date: 08/20/1970    Years since quitting: 52.7   Smokeless tobacco: Never  Vaping Use   Vaping status: Never Used  Substance Use  Topics   Alcohol use: Yes    Alcohol/week: 3.0 - 4.0 standard drinks of alcohol    Types: 1 - 2 Glasses of wine, 1 Cans of beer, 1 Shots of liquor per week    Comment: not every night.   Drug use: No   Shannon [name] reports that she quit smoking about 52 years ago. Her smoking use included cigarettes. She has never used smokeless tobacco. She reports current alcohol use of about 3.0 - 4.0 standard drinks of alcohol per week. She reports that she does not use drugs.  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 No masses, hepatosplenomegaly or hernias noted. Normal Bowel sounds Rectal: Deferred Musculoskeletal: Symmetrical with no gross deformities  Pulses:  Normal pulses noted Extremities: No edema or deformities noted Neurological: Alert oriented x 4, grossly nonfocal Psychological:  Alert and cooperative. Normal mood and affect  Review of Data  The following data was reviewed at the time of this encounter:  Laboratory Studies      Latest Ref Rng & Units 04/03/2023   10:13 AM 09/04/2022    8:56 AM 03/05/2022    3:32 PM  CBC  WBC 4.0 - 10.5 K/uL 4.4  4.1  6.5   Hemoglobin 12.0 - 15.0 g/dL 64.3  32.9  51.8   Hematocrit 36.0 - 46.0 % 39.7  39.3  39.9   Platelets 150.0 - 400.0 K/uL 230.0  272.0  249.0     No results found for: "LIPASE"    Latest Ref Rng & Units 04/03/2023   10:13 AM 09/04/2022    8:56 AM 03/05/2022    3:32 PM  CMP  Glucose 70 - 99 mg/dL 96  841  88    BUN 6 - 23 mg/dL 23  13  20    Creatinine 0.40 - 1.20 mg/dL 6.60  6.30  1.60   Sodium 135 - 145 mEq/L 138  139  139   Potassium 3.5 - 5.1 mEq/L 4.2  4.2  4.7   Chloride 96 - 112 mEq/L 102  103  103   CO2 19 - 32 mEq/L 29  26  30    Calcium 8.4 - 10.5 mg/dL 9.0  9.3  9.4   Total Protein 6.0 - 8.3 g/dL 6.7  6.7  6.6   Total Bilirubin 0.2 - 1.2 mg/dL 1.1  1.0  0.6   Alkaline Phos 39 - 117 U/L 63  64  71   AST 0 - 37 U/L 22  24  23    ALT 0 - 35 U/L 16  17  17       Imaging Studies  None  GI Procedures and Studies  Colonoscopy 03/07/2016 Normal  Colonoscopy 09/06/2010 Diverticulosis, otherwise normal  Colonoscopy 07/24/2005 Tortuous colon, diverticulosis, otherwise normal  Colonoscopy 02/01/2000 Tortuous and moderately difficult colon, otherwise normal Path: Random biopsies normal  Clinical Impression  It is my clinical impression that Shannon [name] is a 73 y.o. female with;  Chronic constipation History of rectocele and vaginal prolapse s/p repair Family history of colorectal cancer in two second-degree relatives  Shannon [name] presents for evaluation of chronic constipation which began in 2001.  By history, her constipation sounds to be a combination of slow transit and obstructive defecation.  She has noted amelioration of her symptoms by eliminating wheat, dairy and sugar from her diet.  She also notes that her bowels became better regulated when she was taking a thyroid T3/T4 supplement previously prescribed by an alternative endocrinologist.  After ceasing the T3/T4 she began noting worsening constipation.  She has a pertinent history of vaginal prolapse and rectocele status post repair.  After surgery she participated in pelvic floor physical therapy.  She has had 4 lifetime colonoscopies which have shown a tortuous colon, diverticulosis but otherwise normal.  She has never had colon polyps.  There is a family history of colorectal cancer in 2 second-degree relatives.  At today's visit,  her constipation does not sound substantially changed from the past.  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  Reviewed that in this setting it would be appropriate for her to continue on laxatives to regulate bowel frequency for quality of life.  I encouraged her to use a combination of osmotic laxatives containing magnesium in conjunction with a stimulant laxative such as senna.  We also reviewed that if these modalities are ineffective there are several prescription laxatives that could potentially be of benefit to her: Amitiza, Linzess, Motegrity and Trulance.  We discussed whether or not she needs to expedite her next screening colonoscopy which is due in 2027 based upon her current symptoms.  Her constipation appears largely unchanged from the past 20 years without alarm features.  Based upon this and the fact that she has never had colon polyps, I advised Shannon [name] she can proceed with her next colonoscopy in 2027 but I would be happy to perform one sooner if she had any concerns.  She agreed to monitor for alarm features and contact the office if anything changed.  Plan  Continue diet elimination of wheat, dairy and sugar Utilize osmotic laxative containing magnesium in the evening to soften stools Utilize stimulant laxative such as senna also in the evening in order to augment colonic motility and high amplitude contractions If these measures are ineffective can consider prescription laxatives: Amitiza, Linzess, Motegrity, Trulance. If obstructive defecation remains an issue can consider revisiting pelvic floor physical therapy If alarm features evolve  notify our office to expedite timeframe of next screening colonoscopy scheduled in 2027.  Planned Follow Up Return if symptoms worsen or fail to improve.  The patient or caregiver verbalized understanding of the material covered, with no barriers to understanding. All questions  were answered. Patient or caregiver is agreeable with the plan outlined above.    It was a pleasure to see Shannon [name].  If you have any questions or concerns regarding this evaluation, do not hesitate to contact me.  Maren Beach, MD [facility]  I spent total of 45 minutes in both face-to-face and non-face-to-face activities, excluding procedures performed, for the visit on the date of this encounter.

## 2023-05-20 ENCOUNTER — Ambulatory Visit: Payer: Medicare Other | Admitting: Pediatrics

## 2023-05-20 ENCOUNTER — Encounter: Payer: Self-pay | Admitting: Pediatrics

## 2023-05-20 VITALS — BP 120/72 | HR 64 | Ht 65.0 in | Wt 171.6 lb

## 2023-05-20 DIAGNOSIS — Z800 Family history of malignant neoplasm of digestive organs: Secondary | ICD-10-CM | POA: Diagnosis not present

## 2023-05-20 DIAGNOSIS — K5909 Other constipation: Secondary | ICD-10-CM | POA: Diagnosis not present

## 2023-05-20 DIAGNOSIS — Z87448 Personal history of other diseases of urinary system: Secondary | ICD-10-CM | POA: Diagnosis not present

## 2023-05-20 DIAGNOSIS — K5900 Constipation, unspecified: Secondary | ICD-10-CM

## 2023-05-20 NOTE — Patient Instructions (Signed)
_______________________________________________________  If your blood pressure at your visit was 140/90 or greater, please contact your primary care physician to follow up on this.  _______________________________________________________  If you are age 73 or older, your body mass index should be between 23-30. Your Body mass index is 28.56 kg/m. If this is out of the aforementioned range listed, please consider follow up with your Primary Care Provider. ________________________________________________________  The [facility] providers would like to encourage you to use [facility] to communicate with providers for non-urgent requests or questions.  Due to long hold times on the telephone, sending your provider a message by [facility] Isma be a faster and more efficient way to get a response.  Please allow 48 business hours for a response.  Please remember that this is for non-urgent requests.  _______________________________________________________  Thank you for entrusting me with your care and choosing [facility].  Dr Doy Hutching

## 2023-05-30 NOTE — Progress Notes (Signed)
[employer] was contacted through the provider portal to check CPT codes for procedure : Urethral Bulking CPT code: 14782, [phone]  NO Auth required for this patient and for this procedure. Attempt read:  warning Prior Authorizations are not required for this member. For claim status or additional information, please visit the [facility] website.

## 2023-06-01 ENCOUNTER — Encounter: Payer: Self-pay | Admitting: Family Medicine

## 2023-06-02 ENCOUNTER — Other Ambulatory Visit: Payer: Self-pay | Admitting: Family Medicine

## 2023-06-02 DIAGNOSIS — Z23 Encounter for immunization: Secondary | ICD-10-CM

## 2023-06-02 MED ORDER — TYPHOID VACCINE PO CPDR: 1.0000 | DELAYED_RELEASE_CAPSULE | ORAL | 0 refills | Status: DC

## 2023-06-03 ENCOUNTER — Encounter: Payer: Self-pay | Admitting: Family Medicine

## 2023-06-03 ENCOUNTER — Other Ambulatory Visit: Payer: Self-pay | Admitting: Family

## 2023-06-03 DIAGNOSIS — Z411 Encounter for cosmetic surgery: Secondary | ICD-10-CM | POA: Diagnosis not present

## 2023-06-03 DIAGNOSIS — L942 Calcinosis cutis: Secondary | ICD-10-CM | POA: Diagnosis not present

## 2023-06-03 DIAGNOSIS — Z5189 Encounter for other specified aftercare: Secondary | ICD-10-CM | POA: Diagnosis not present

## 2023-06-03 MED ORDER — ESCITALOPRAM OXALATE 10 MG PO TABS: 15.0000 mg | ORAL_TABLET | Freq: Every day | ORAL | 1 refills | Status: DC

## 2023-06-06 ENCOUNTER — Other Ambulatory Visit: Payer: Self-pay | Admitting: Family Medicine

## 2023-06-06 NOTE — Telephone Encounter (Signed)
 Copied from CRM [id number]. Topic: Clinical - Prescription Issue >> Jun 06, 2023  3:54 PM Kinnie DEL wrote: Reason for CRM: Pharmacy needs to be changed to [facility] [place], [place] - [address] [place] [address] Phone: [phone] Fax: [phone] Hours: Not open 24 hours  escitalopram  (LEXAPRO ) 10 MG tablet needs to resent there

## 2023-06-09 ENCOUNTER — Encounter: Payer: Self-pay | Admitting: Family Medicine

## 2023-06-09 DIAGNOSIS — M542 Cervicalgia: Secondary | ICD-10-CM | POA: Diagnosis not present

## 2023-06-09 DIAGNOSIS — M5441 Lumbago with sciatica, right side: Secondary | ICD-10-CM | POA: Diagnosis not present

## 2023-06-09 NOTE — Assessment & Plan Note (Signed)
 Encourage heart healthy diet such as MIND or DASH diet, increase exercise, avoid trans fats, simple carbohydrates and processed foods, consider a krill or fish or flaxseed oil cap daily.

## 2023-06-09 NOTE — Assessment & Plan Note (Signed)
 On Levothyroxine, continue to monitor

## 2023-06-09 NOTE — Assessment & Plan Note (Signed)
 Stable on low dose meds

## 2023-06-09 NOTE — Assessment & Plan Note (Signed)
 Supplement and monitor

## 2023-06-09 NOTE — Assessment & Plan Note (Signed)
 [id number] acceptable, minimize simple carbs. Increase exercise as tolerated.

## 2023-06-10 ENCOUNTER — Telehealth: Payer: Medicare Other | Admitting: Family Medicine

## 2023-06-10 ENCOUNTER — Encounter: Payer: Self-pay | Admitting: Family Medicine

## 2023-06-10 VITALS — BP 120/72 | HR 64 | Ht 65.0 in | Wt 171.0 lb

## 2023-06-10 DIAGNOSIS — E559 Vitamin D deficiency, unspecified: Secondary | ICD-10-CM | POA: Diagnosis not present

## 2023-06-10 DIAGNOSIS — F900 Attention-deficit hyperactivity disorder, predominantly inattentive type: Secondary | ICD-10-CM

## 2023-06-10 DIAGNOSIS — E782 Mixed hyperlipidemia: Secondary | ICD-10-CM

## 2023-06-10 DIAGNOSIS — E039 Hypothyroidism, unspecified: Secondary | ICD-10-CM

## 2023-06-10 DIAGNOSIS — R739 Hyperglycemia, unspecified: Secondary | ICD-10-CM

## 2023-06-10 MED ORDER — DEXTROAMPHETAMINE SULFATE 10 MG PO TABS: 10.0000 mg | ORAL_TABLET | Freq: Every day | ORAL | 0 refills | Status: DC

## 2023-06-10 NOTE — Progress Notes (Signed)
MyChart Video Visit    Virtual Visit via Video Note   This patient is at least at moderate risk for complications without adequate follow up. This format is felt to be most appropriate for this patient at this time. Physical exam was limited by quality of the video and audio technology used for the visit. Juanetta, [name] was able to get the patient set up on a video visit.  Patient location: home Patient and provider in visit Provider location: Office  I discussed the limitations of evaluation and management by telemedicine and the availability of in person appointments. The patient expressed understanding and agreed to proceed.  Visit Date: 06/10/2023  Today's healthcare provider: Danise Edge, MD     Subjective:    Patient ID: Shannon [name], female    DOB: 12-Jan-1951, 73 y.o.   MRN: [id number]  Chief Complaint  Patient presents with   Follow-up    HPI Discussed the use of AI scribe software for clinical note transcription with the patient, who gave verbal consent to proceed.  History of Present Illness   The patient, with a history of ADHD, bowel motility issues, and urinary incontinence, presents for a routine follow-up. She reports that she is currently taking dextroamphetamine for ADHD but expresses uncertainty about its effectiveness. She mentions having started the process of scheduling an appointment for a re-evaluation of her ADHD but has not yet completed it.  The patient also discusses her bowel motility issues, stating that she has seen a gastroenterologist who offered a prescription if over-the-counter remedies were not effective. The patient has not yet decided to pursue a colonoscopy, as the gastroenterologist did not deem it necessary at this time.  In addition, the patient mentions urinary incontinence, particularly during physical activity and after consuming alcohol or Diet Coke at night. She reports that this issue has worsened since her previous surgery and is  planning to undergo a bulking procedure after returning from a trip to [place].        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 Left torn meniscus Right arthritis   Lead exposure 03/02/2018   Lumbar degenerative disc disease 02/24/2017   MRI 2019 confirms multilevel change  Sciatica at times 2/2 this and periodic radiculopathy   Major depressive disorder 03/08/2013   Melanocytic nevi of trunk    Menopause    Neck pain on left side 09/10/2018   Suspect this is DDD and DJD - XR is pending Neurapraxic spasm to left trapezius   Neurofibroma 07/20/2019   Nuclear sclerotic cataract of both eyes 07/23/2012   OAB (overactive bladder) 08/05/2016   Osteopenia    PONV (postoperative nausea and vomiting)    Rosacea    Sciatica of left side 02/24/2017   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   By exam she has chronic tendinopathy of Glut Med   Urinary urgency 09/17/2020   Vertigo 04/12/2019   Vitamin D deficiency 02/28/2014  Past Surgical History:  Procedure Laterality Date   ANTERIOR AND POSTERIOR REPAIR  05/13/2022   Procedure: ANTERIOR (CYSTOCELE) AND POSTERIOR REPAIR (RECTOCELE), PERINEORRHAPHY;  Surgeon: Marguerita Beards, MD;  Location: [facility];  Service: Gynecology;;   blephoroplasty     BROW LIFT     CYSTOSCOPY N/A 05/13/2022   Procedure: CYSTOSCOPY;  Surgeon: Marguerita Beards, MD;  Location: [facility];  Service: Gynecology;   Laterality: N/A;   DILATION AND CURETTAGE OF UTERUS     SKIN CANCER EXCISION     right lower lip   TONSILLECTOMY AND ADENOIDECTOMY      Family History  Problem Relation Age of Onset   Colon polyps Mother    Parkinson's disease Mother        developed Parkinson's disease dementia   Diabetes Father    Emphysema Father    Tuberculosis Father    Alcohol abuse Sister    Cancer Sister        skin cancer, melanoma cell, basil cell   Cancer Sister        melanoma   Anxiety disorder Brother    Parkinson's disease Maternal Grandmother        developed Parkinson's disease dementia   Colon cancer Maternal Grandfather    ADD / ADHD Daughter    Anxiety disorder Daughter    Depression Daughter    ADD / ADHD Son    Anxiety disorder Son    Depression Son     Social History   Socioeconomic History   Marital status: Single    Spouse name: Not on file   Number of children: 2   Years of education: 18   Highest education level: Master's degree (e.g., MA, MS, MEng, MEd, MSW, MBA)  Occupational History   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   Drug use: No   Sexual activity: Not Currently  Other Topics Concern   Not on file  Social History Narrative   Retired from [employer].   No major dietary restrictions except avoid gluten.       Social Drivers of Corporate investment banker Strain: Low Risk  (02/27/2023)   Overall Financial Resource Strain (CARDIA)    Difficulty of Paying Living Expenses: Not hard at all  Food Insecurity: No Food  Insecurity (02/27/2023)   Hunger Vital Sign    Worried About Running Out of Food in the Last Year: Never true    Ran Out of Food in the Last Year: Never true  Transportation Needs: No Transportation Needs (02/27/2023)   PRAPARE - Administrator, Civil Service (Medical): No    Lack of Transportation (Non-Medical): No  Physical Activity: Insufficiently Active (02/27/2023)   Exercise Vital Sign    Days of Exercise per Week: 1 day    Minutes of Exercise per Session: 30 min  Stress: Stress Concern Present (02/27/2023)   [employer] of  Occupational Health - Occupational Stress Questionnaire    Feeling of Stress : To some extent  Social Connections: Moderately Integrated (02/27/2023)   Social Connection and Isolation Panel [NHANES]    Frequency of Communication with Friends and Family: More than three times a week    Frequency of Social Gatherings with Friends and Family: Twice a week    Attends Religious Services: More than 4 times per year    Active Member of [employer] or Organizations: Yes    Attends Engineer, structural: More than 4 times per year    Marital Status: Divorced  Intimate Partner Violence: Not At Risk (08/01/2022)   Humiliation, Afraid, Rape, and Kick questionnaire    Fear of Current or Ex-Partner: No    Emotionally Abused: No    Physically Abused: No    Sexually Abused: No    Outpatient Medications Prior to Visit  Medication Sig Dispense Refill   Albuterol Sulfate (PROAIR RESPICLICK) 108 (90 Base) MCG/ACT AEPB Inhale 2 puffs every 4-6 hours as needed for cough and shortness of breath. 1 each 0   escitalopram (LEXAPRO) 10 MG tablet Take 1.5 tablets (15 mg total) by mouth daily. 135 tablet 1   estradiol (ESTRACE) 0.1 MG/GM vaginal cream INSERT ONE APPLICATORFUL VAGINALLY TWICE A WEEK AT BEDTIME 42.5 g 1   gabapentin (NEURONTIN) 300 MG capsule Take 1 capsule (300 mg total) by mouth 3 (three) times daily as needed.     GAMMA AMINOBUTYRIC ACID PO Take  200 mg by mouth as needed. Up to bid     Krill Oil 1000 MG CAPS      Lactobacillus Acidophilus (ACIDOPHILUS LACTOBACILLUS) 10 BIL UNT/GM POWD Take 1 capsule by mouth daily.     LORazepam (ATIVAN) 2 MG tablet Take 1 tablet (2 mg total) by mouth every 8 (eight) hours as needed for anxiety. 40 tablet 1   Magnesium Citrate 125 MG CAPS Take by mouth as directed.     MAGNESIUM GLYCINATE PLUS PO Take 100 mg by mouth 2 (two) times daily.     Nutritional Supplements (PEPTINEX DT/PREBIOTICS PO) Take by mouth.     nystatin ointment (MYCOSTATIN)      TIROSINT 88 MCG CAPS TAKE ONE Capsule BY MOUTH EVERY DAY BEFORE BREAKFAST 90 capsule 1   tretinoin (RETIN-A) 0.025 % cream Apply 0.025 % topically daily. Pea size amount     typhoid (VIVOTIF) DR capsule Take 1 capsule by mouth every other day. 4 capsule 0   dextroamphetamine (DEXTROSTAT) 10 MG tablet Take 1 tablet (10 mg total) by mouth daily. 30 tablet 0   No facility-administered medications prior to visit.    Allergies  Allergen Reactions   Gluten Meal Other (See Comments)    GI symptoms; has BM everytime she urinates GI symptoms; has BM everytime she urinates    Review of Systems  Constitutional:  Positive for malaise/fatigue. Negative for fever.  HENT:  Negative for congestion.   Eyes:  Negative for blurred vision.  Respiratory:  Negative for shortness of breath.   Cardiovascular:  Negative for chest pain, palpitations and leg swelling.  Gastrointestinal:  Positive for constipation. Negative for abdominal pain, blood in stool and nausea.  Genitourinary:  Positive for frequency and urgency. Negative for dysuria.  Musculoskeletal:  Negative for falls.  Skin:  Negative for rash.  Neurological:  Negative for dizziness, loss of consciousness and headaches.  Endo/Heme/Allergies:  Negative for environmental allergies.  Psychiatric/Behavioral:  Positive for depression. The patient is nervous/anxious.  Objective:    Physical  Exam Constitutional:      General: She is not in acute distress.    Appearance: Normal appearance. She is not ill-appearing or toxic-appearing.  HENT:     Head: Normocephalic and atraumatic.     Right Ear: External ear normal.     Left Ear: External ear normal.     Nose: Nose normal.  Eyes:     General:        Right eye: No discharge.        Left eye: No discharge.  Pulmonary:     Effort: Pulmonary effort is normal.  Skin:    Findings: No rash.  Neurological:     Mental Status: She is alert and oriented to person, place, and time.  Psychiatric:        Behavior: Behavior normal.     BP 120/72 Comment: From GI appointment on 05/20/23  Pulse 64 Comment: From GI appointment on 05/20/23  Ht 5\' 5"  (1.651 m)   Wt 171 lb (77.6 kg) Comment: From GI appointment on 05/20/23  LMP 03/29/2004   BMI 28.46 kg/m  Wt Readings from Last 3 Encounters:  06/10/23 171 lb (77.6 kg)  05/20/23 171 lb 9.6 oz (77.8 kg)  02/19/23 171 lb 9.6 oz (77.8 kg)       Assessment & Plan:  ADHD (attention deficit hyperactivity disorder), inattentive type Assessment & Plan: Stable on low dose meds   Hyperglycemia Assessment & Plan: hgba1c acceptable, minimize simple carbs. Increase exercise as tolerated.    Hyperlipidemia, mixed Assessment & Plan: Encourage heart healthy diet such as MIND or DASH diet, increase exercise, avoid trans fats, simple carbohydrates and processed foods, consider a krill or fish or flaxseed oil cap daily.     Hypothyroidism, unspecified type Assessment & Plan: On Levothyroxine, continue to monitor    Vitamin D deficiency Assessment & Plan: Supplement and monitor    Other orders -     Dextroamphetamine Sulfate; Take 1 tablet (10 mg total) by mouth daily.  Dispense: 30 tablet; Refill: 0     Assessment and Plan    Attention Deficit Hyperactivity Disorder (ADHD) Continued struggle with focus and organization. Patient considering re-evaluation for ADHD  management. -Continue Dextroamphetamine. -Consider re-evaluation with Amy Elisabeth Most or internal neuropsychology if not completed by Fall 2025.  Bowel Motility Ongoing issues with incomplete bowel movements. Patient considering return to pelvic physical therapy. -Consider return to pelvic physical therapy for bowel motility issues.  Urinary Incontinence Worsening symptoms post-surgery, particularly with physical activity and certain dietary triggers. Patient planning for bulking procedure post-[place] trip. -Proceed with bulking procedure post-[place] trip as planned with Dr. Florian Buff.  Follow-up in [place] 2025 for in-person appointment.         I discussed the assessment and treatment plan with the patient. The patient was provided an opportunity to ask questions and all were answered. The patient agreed with the plan and demonstrated an understanding of the instructions.   The patient was advised to call back or seek an in-person evaluation if the symptoms worsen or if the condition fails to improve as anticipated.  Danise Edge, MD [facility] at [facility] [phone] (phone) [phone] (fax)  [facility]

## 2023-06-12 ENCOUNTER — Encounter: Payer: Self-pay | Admitting: Family Medicine

## 2023-06-12 ENCOUNTER — Other Ambulatory Visit: Payer: Self-pay | Admitting: Family Medicine

## 2023-06-12 MED ORDER — MELOXICAM 15 MG PO TABS: 15.0000 mg | ORAL_TABLET | Freq: Every day | ORAL | 0 refills | Status: DC | PRN

## 2023-06-18 ENCOUNTER — Telehealth: Payer: Self-pay | Admitting: Family Medicine

## 2023-06-18 NOTE — Telephone Encounter (Signed)
Copied from CRM [id number]. Topic: Medicare AWV >> Jun 18, 2023  1:42 PM Payton Doughty wrote: Reason for CRM: Called LVM 06/18/2023 to schedule AWV. Please schedule Virtual or Telehealth visits ONLY.   Verlee Rossetti; Care Guide Ambulatory Clinical Support [place] [facility] Direct Dial: [phone]

## 2023-06-25 DIAGNOSIS — K120 Recurrent oral aphthae: Secondary | ICD-10-CM | POA: Diagnosis not present

## 2023-06-25 DIAGNOSIS — J029 Acute pharyngitis, unspecified: Secondary | ICD-10-CM | POA: Diagnosis not present

## 2023-07-01 DIAGNOSIS — Z1231 Encounter for screening mammogram for malignant neoplasm of breast: Secondary | ICD-10-CM | POA: Diagnosis not present

## 2023-07-01 LAB — HM MAMMOGRAPHY

## 2023-07-02 DIAGNOSIS — M542 Cervicalgia: Secondary | ICD-10-CM | POA: Diagnosis not present

## 2023-07-02 DIAGNOSIS — M5441 Lumbago with sciatica, right side: Secondary | ICD-10-CM | POA: Diagnosis not present

## 2023-07-03 ENCOUNTER — Ambulatory Visit: Payer: Medicare Other | Admitting: Obstetrics and Gynecology

## 2023-07-03 ENCOUNTER — Encounter: Payer: Self-pay | Admitting: Family Medicine

## 2023-07-03 ENCOUNTER — Encounter: Payer: Self-pay | Admitting: Obstetrics and Gynecology

## 2023-07-03 VITALS — BP 128/86 | HR 67

## 2023-07-03 DIAGNOSIS — N393 Stress incontinence (female) (male): Secondary | ICD-10-CM

## 2023-07-03 DIAGNOSIS — R350 Frequency of micturition: Secondary | ICD-10-CM

## 2023-07-03 LAB — POCT URINALYSIS DIPSTICK
Bilirubin, UA: NEGATIVE
Blood, UA: NEGATIVE
Glucose, UA: NEGATIVE
Ketones, UA: NEGATIVE
Nitrite, UA: NEGATIVE
Protein, UA: NEGATIVE
Spec Grav, UA: 1.015 (ref 1.010–1.025)
Urobilinogen, UA: 0.2 U/dL
pH, UA: 7 (ref 5.0–8.0)

## 2023-07-03 MED ORDER — LIDOCAINE-EPINEPHRINE 1 %-1:100000 IJ SOLN
8.0000 mL | Freq: Once | INTRAMUSCULAR | Status: AC
Start: 2023-07-03 — End: 2023-07-03
  Administered 2023-07-03: 8 mL

## 2023-07-03 MED ORDER — LIDOCAINE HCL URETHRAL/MUCOSAL 2 % EX GEL
1.0000 | Freq: Once | CUTANEOUS | Status: AC
  Administered 2023-07-03: 1 via URETHRAL

## 2023-07-03 NOTE — Progress Notes (Signed)
 Bulkamid Injection  CC: 73 y.o. y.o. F with stress incontinence who presents for transurethral Bulkamid injection.  Patient signed her consent form.  She started antibiotic prophylaxis today.  Today's Vitals   07/03/23 1112  BP: 128/86  Pulse: 67    Results for orders placed or performed in visit on 07/03/23 (from the past 24 hours)  POCT Urinalysis Dipstick     Status: Abnormal   Collection Time: 07/03/23 12:56 PM  Result Value Ref Range   Color, UA Yellow    Clarity, UA Clear    Glucose, UA Negative Negative   Bilirubin, UA Negative    Ketones, UA Negative    Spec Grav, UA 1.015 1.010 - 1.025   Blood, UA Negative    pH, UA 7.0 5.0 - 8.0   Protein, UA Negative Negative   Urobilinogen, UA 0.2 0.2 or 1.0 E.U./dL   Nitrite, UA Negative    Leukocytes, UA Trace (A) Negative   Appearance     Odor      Procedure: Time out was performed. The bladder was catheterized and 10 ml of 2% lidocaine jelly placed in the urethra. A urethral block was performed by injecting 4ml of 1% lidocaine with epinephrine at 3 and 9 o'clock adjacent to the urethra.  The needle was primed.  The cystoscope was inserted to the level of the bladder neck.  The needle was inserted 2 cm and the scope was pulled back into the urethra 2 cm.  The needle was inserted bevel up at the 5 o'clock position and the Bulkamid was injected to obtain coaptation.  This was repeated at the 2 o'clock,  10 o'clock and 7 o'clock positions.   A total of 2- 1ml syringes were used and good circumferential coaptation was noted.  The patient tolerated the procedure well. She was asked to void after the procedure.  Post-Void Residual (PVR) by Bladder Scan: In order to evaluate bladder emptying, we discussed obtaining a postvoid residual and she agreed to this procedure.  Procedure: The ultrasound unit was placed on the patient's abdomen in the suprapubic region after the patient had voided. A PVR of 105 ml was obtained by bladder scan.    Post Void Residual - 07/03/23 1309       Post Void Residual   Post Void Residual 105 mL              ASSESSMENT: 73 y.o. y.o. s/p transurethral Bulkamid injection for stress incontinence.   PLAN: Patient will follow up in 4 weeks to reassess. Voiding and post-procedure precautions were given. She will return for heavy bleeding, fevers, dysuria lasting beyond today and incomplete emptying.  All questions were answered.  Marguerita Beards, MD

## 2023-07-03 NOTE — Patient Instructions (Signed)

## 2023-07-08 ENCOUNTER — Encounter: Payer: Self-pay | Admitting: Family Medicine

## 2023-07-09 ENCOUNTER — Other Ambulatory Visit: Payer: Self-pay | Admitting: Family

## 2023-07-09 MED ORDER — DEXTROAMPHETAMINE SULFATE 10 MG PO TABS: 10.0000 mg | ORAL_TABLET | Freq: Every day | ORAL | 0 refills | Status: DC

## 2023-07-09 NOTE — Telephone Encounter (Signed)
 Requesting: Dextroamphetamine 10 mg Contract: 09/02/2022 UDS: 09/02/2022 Last Visit: 09/02/2022 Next Visit: 08/28/2023 Last Refill: 06/10/2023  Please Advise

## 2023-07-09 NOTE — Telephone Encounter (Signed)
 Next office visit is 08/28/23

## 2023-07-22 DIAGNOSIS — H52203 Unspecified astigmatism, bilateral: Secondary | ICD-10-CM | POA: Diagnosis not present

## 2023-07-22 DIAGNOSIS — H02831 Dermatochalasis of right upper eyelid: Secondary | ICD-10-CM | POA: Diagnosis not present

## 2023-07-22 DIAGNOSIS — H5213 Myopia, bilateral: Secondary | ICD-10-CM | POA: Diagnosis not present

## 2023-07-22 DIAGNOSIS — H43813 Vitreous degeneration, bilateral: Secondary | ICD-10-CM | POA: Diagnosis not present

## 2023-07-22 DIAGNOSIS — H25813 Combined forms of age-related cataract, bilateral: Secondary | ICD-10-CM | POA: Diagnosis not present

## 2023-07-22 DIAGNOSIS — H02834 Dermatochalasis of left upper eyelid: Secondary | ICD-10-CM | POA: Diagnosis not present

## 2023-07-22 DIAGNOSIS — H524 Presbyopia: Secondary | ICD-10-CM | POA: Diagnosis not present

## 2023-07-23 DIAGNOSIS — M542 Cervicalgia: Secondary | ICD-10-CM | POA: Diagnosis not present

## 2023-07-23 DIAGNOSIS — M5441 Lumbago with sciatica, right side: Secondary | ICD-10-CM | POA: Diagnosis not present

## 2023-08-04 ENCOUNTER — Encounter: Payer: Self-pay | Admitting: Family Medicine

## 2023-08-05 ENCOUNTER — Other Ambulatory Visit: Payer: Self-pay | Admitting: Family Medicine

## 2023-08-05 MED ORDER — LISDEXAMFETAMINE DIMESYLATE 10 MG PO CAPS: 10.0000 mg | ORAL_CAPSULE | Freq: Every day | ORAL | 0 refills | Status: DC

## 2023-08-06 ENCOUNTER — Telehealth: Payer: Self-pay

## 2023-08-06 ENCOUNTER — Other Ambulatory Visit (HOSPITAL_COMMUNITY): Payer: Self-pay

## 2023-08-06 NOTE — Telephone Encounter (Signed)
 Hello,                 Please see that we have received a Prior Authorization re [facility] for Lisdexamfetamine Dimesylate 10MG  capsules. However it is not a drug covered by [facility].   The plans pref'd drugs are listed below.

## 2023-08-08 ENCOUNTER — Encounter: Payer: Self-pay | Admitting: Obstetrics and Gynecology

## 2023-08-08 ENCOUNTER — Other Ambulatory Visit (HOSPITAL_COMMUNITY)
Admission: RE | Admit: 2023-08-08 | Discharge: 2023-08-08 | Disposition: A | Discharge status: Home / Self Care | Source: Other Acute Inpatient Hospital | Attending: Obstetrics and Gynecology | Admitting: Obstetrics and Gynecology

## 2023-08-08 ENCOUNTER — Ambulatory Visit: Admitting: Obstetrics and Gynecology

## 2023-08-08 VITALS — BP 99/66 | HR 55

## 2023-08-08 DIAGNOSIS — N3946 Mixed incontinence: Secondary | ICD-10-CM

## 2023-08-08 DIAGNOSIS — N393 Stress incontinence (female) (male): Secondary | ICD-10-CM

## 2023-08-08 DIAGNOSIS — R319 Hematuria, unspecified: Secondary | ICD-10-CM

## 2023-08-08 DIAGNOSIS — R350 Frequency of micturition: Secondary | ICD-10-CM | POA: Diagnosis not present

## 2023-08-08 DIAGNOSIS — K5904 Chronic idiopathic constipation: Secondary | ICD-10-CM

## 2023-08-08 DIAGNOSIS — N3941 Urge incontinence: Secondary | ICD-10-CM

## 2023-08-08 LAB — POCT URINALYSIS DIPSTICK
Bilirubin, UA: NEGATIVE
Glucose, UA: NEGATIVE
Ketones, UA: NEGATIVE
Leukocytes, UA: NEGATIVE
Nitrite, UA: NEGATIVE
Protein, UA: NEGATIVE
Spec Grav, UA: >=1.03 — AB (ref 1.010–1.025)
Urobilinogen, UA: 0.2 U/dL
pH, UA: 5.5 (ref 5.0–8.0)

## 2023-08-08 LAB — URINALYSIS, ROUTINE W REFLEX MICROSCOPIC
Bilirubin Urine: NEGATIVE
Glucose, UA: NEGATIVE mg/dL
Hgb urine dipstick: NEGATIVE
Ketones, ur: 5 mg/dL — AB
Leukocytes,Ua: NEGATIVE
Nitrite: NEGATIVE
Protein, ur: NEGATIVE mg/dL
Specific Gravity, Urine: 1.023 (ref 1.005–1.030)
pH: 5 (ref 5.0–8.0)

## 2023-08-08 NOTE — Progress Notes (Signed)
 [facility] Return Visit  SUBJECTIVE  History of Present Illness: Shannon [name] is a 73 y.o. female seen in follow-up for SUI. She underwent urethral bulking on 05/12/23.  Had a lot less leakage up until this week. Decreased to the lightest pad or incontinence underwear. On Saturday she went on a hike and  Last night, she was walking and her pad got very wet- had a large amount spill out. She did have an alcoholic cider yesterday.  A few times this week had more urgency and leakage to the toilet. She does not have burning with urination.    s/p Anterior and posterior repair with perineorrhaphy, cystoscopy on 05/13/22. Denies any prolapse symptoms. Still has some issues with constipation but usually takes colon max magnesium supplement. Sometimes take smooth move tea. Was taking miralax but ran out.   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), Anxiety, Asthma, Basal cell carcinoma of face, Benign neoplasm of skin of lower limb, Bilateral carotid bruits (04/12/2019), Diverticulosis, Gestational diabetes, Hammer toe, acquired (03/15/2009), Heart murmur (03/02/2018), History of COVID-19 (09/14/2020), Hyperglycemia (02/24/2017), Hyperlipidemia, mixed (03/11/2016), Hypothyroidism, Knee pain (09/26/2010), Lead exposure (03/02/2018), Lumbar degenerative disc disease (02/24/2017), Major depressive disorder (03/08/2013), Melanocytic nevi of trunk, Menopause, Neck pain on left side (09/10/2018), Neurofibroma (07/20/2019), Nuclear sclerotic cataract of both eyes (07/23/2012), OAB (overactive bladder) (08/05/2016), Osteopenia, PONV (postoperative nausea and vomiting), Rosacea, Sciatica of left side (02/24/2017), 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   Past Surgical History: She  has a past surgical history  that includes Tonsillectomy and adenoidectomy; Dilation and curettage of uterus; blephoroplasty; Brow lift; Skin cancer excision; Cystoscopy (N/A, 05/13/2022); and Anterior and posterior repair (05/13/2022).   Medications: She has a current medication list which includes the following prescription(s): proair respiclick, escitalopram, estradiol, gabapentin, gamma-aminobutyric acid, krill oil, acidophilus lactobacillus, lisdexamfetamine, lorazepam, magnesium citrate, magnesium, meloxicam, nutritional supplements, nystatin ointment, tirosint, and tretinoin.   Allergies: Patient is allergic to gluten meal.   Social History: Patient  reports that she quit smoking about 53 years ago. Her smoking use included cigarettes. She has never used smokeless tobacco. She reports current alcohol use of about 3.0 - 4.0 standard drinks of alcohol per week. She reports that she does not use drugs.     OBJECTIVE     Physical Exam: Vitals:   08/08/23 0833  BP: 99/66  Pulse: (!) 55   Gen: No apparent distress, A&O x 3.  Detailed Urogynecologic Evaluation:  Deferred.    Results for orders placed or performed in visit on 08/08/23  POCT Urinalysis Dipstick   Collection Time: 08/08/23 10:56 AM  Result Value Ref Range   Color, UA Yellow    Clarity, UA Clear    Glucose, UA Negative Negative   Bilirubin, UA Negative    Ketones, UA Negative    Spec Grav, UA >=1.030 (A) 1.010 - 1.025   Blood, UA Trace-Intact    pH, UA 5.5 5.0 - 8.0   Protein, UA Negative Negative   Urobilinogen, UA 0.2 0.2 or 1.0 E.U./dL   Nitrite, UA Negative    Leukocytes, UA Negative Negative   Appearance     Odor        ASSESSMENT AND PLAN    [name] is a 73 y.o. with:  1. SUI (stress urinary incontinence, female)   2. Urge incontinence   3. Urinary frequency  4. Hematuria, unspecified type   5. Chronic idiopathic constipation     - Overall has had good improvement with SUI symptoms.  - Has increased urgency and urine  loss just in the last few days, unclear reason as this was not previously an issue for her.  - She will watch her intake of bladder irritants and observe her symptoms for the next few weeks. If she is still noticing some increased SUI symptoms, then can consider another urethral bulking procedure. She will send a my chart message.  - Will send urine for micro and culture due to trace blood present, though no obvious infection.  - For constipation, discussed restarting miralax and taking a small dose consistently. Try to avoid senna.     Marguerita Beards, MD

## 2023-08-09 LAB — URINE CULTURE: Culture: NO GROWTH

## 2023-08-12 NOTE — Telephone Encounter (Signed)
 Vyvanse is non formulary.  Covered meds listed below.

## 2023-08-18 ENCOUNTER — Other Ambulatory Visit: Payer: Self-pay | Admitting: Family Medicine

## 2023-08-18 MED ORDER — LISDEXAMFETAMINE DIMESYLATE 10 MG PO CAPS: 10.0000 mg | ORAL_CAPSULE | Freq: Every day | ORAL | 0 refills | Status: DC

## 2023-08-19 ENCOUNTER — Other Ambulatory Visit: Payer: Self-pay

## 2023-08-19 ENCOUNTER — Other Ambulatory Visit (HOSPITAL_COMMUNITY): Payer: Self-pay

## 2023-08-19 ENCOUNTER — Other Ambulatory Visit: Payer: Self-pay | Admitting: Family Medicine

## 2023-08-19 ENCOUNTER — Telehealth: Payer: Self-pay

## 2023-08-19 MED ORDER — LISDEXAMFETAMINE DIMESYLATE 10 MG PO CAPS: 10.0000 mg | ORAL_CAPSULE | Freq: Every day | ORAL | 0 refills | Status: DC

## 2023-08-19 NOTE — Telephone Encounter (Signed)
 Copied from CRM [id number]. Topic: Clinical - Medication Question >> Aug 19, 2023 10:43 AM Allyne Areola wrote: Reason for CRM: Patient would like to know if we can send her prescription for lisdexamfetamine (VYVANSE ) 10 MG capsule [[id number]] to [id number] - [place], [place] - [address]  Phone: [phone] Fax: [phone]. It's more cost effective through [facility] instead of [facility].

## 2023-08-19 NOTE — Telephone Encounter (Signed)
 Pharmacy Patient Advocate Encounter   Received notification from CoverMyMeds that prior authorization for Lisdexamfetamine Dimesylate  10MG  capsules is required/requested.   Insurance verification completed.   The patient is insured through [employer] .   Per test claim: PA required; PA submitted to above mentioned insurance via CoverMyMeds Key/confirmation #/EOC [id number] Status is pending

## 2023-08-20 ENCOUNTER — Ambulatory Visit (INDEPENDENT_AMBULATORY_CARE_PROVIDER_SITE_OTHER): Payer: Medicare Other | Admitting: Internal Medicine

## 2023-08-20 ENCOUNTER — Other Ambulatory Visit (HOSPITAL_COMMUNITY): Payer: Self-pay

## 2023-08-20 ENCOUNTER — Encounter: Payer: Self-pay | Admitting: Internal Medicine

## 2023-08-20 VITALS — BP 120/76 | HR 57 | Ht 65.0 in | Wt 166.0 lb

## 2023-08-20 DIAGNOSIS — M5441 Lumbago with sciatica, right side: Secondary | ICD-10-CM | POA: Diagnosis not present

## 2023-08-20 DIAGNOSIS — E063 Autoimmune thyroiditis: Secondary | ICD-10-CM | POA: Diagnosis not present

## 2023-08-20 DIAGNOSIS — E559 Vitamin D deficiency, unspecified: Secondary | ICD-10-CM | POA: Diagnosis not present

## 2023-08-20 DIAGNOSIS — M542 Cervicalgia: Secondary | ICD-10-CM | POA: Diagnosis not present

## 2023-08-20 LAB — VITAMIN D 25 HYDROXY (VIT D DEFICIENCY, FRACTURES): Vit D, 25-Hydroxy: 78 ng/mL (ref 30–100)

## 2023-08-20 LAB — TSH: TSH: 0.75 m[IU]/L (ref 0.40–4.50)

## 2023-08-20 LAB — T4, FREE: Free T4: 1.5 ng/dL (ref 0.8–1.8)

## 2023-08-20 NOTE — Progress Notes (Unsigned)
 Name: Shannon [name]  MRN/ DOB: [id number], [birth year]    Age/ Sex: 73 y.o., female    PCP: Neda Balk, MD   Reason for Endocrinology Evaluation: Hypothyroid     Date of Initial Endocrinology Evaluation: 02/12/2021    HPI: Shannon [name] is a 73 y.o. female with a past medical history of Hypothyroidism, gluten intolerance,Asthma , ADHD. The patient presented for initial endocrinology clinic visit on 08/20/2023 for consultative assistance with her hypothyroid.   She has been diagnosed with hypothyroidism in 2005.   She was following with an Alternative Endocrinologist in [place]  . She is on Tirosint  100 mcg daily at night AND Desiccated thyroid  7.5 mg daily, has been on this regimen for ~ 10 yrs   She has gluten intolerance but no celiac disease diagnosis  Historically her thyroid  replacement therapy has been adjusted based on her symptoms, she had noted during the summer that she has depression and leg cramps as well as occasional sleeping issues and would decrease thyroid  hormone replacement therapy during the summer season to half a tablet once a week and 1 tablet the rest of the week.  In the fall she tends to go up to taking 1 tablet daily and has noted improvement in her symptoms.  She is on Vitamin D  replacement . Takes 4000 iu daily in the winter and 2000 iu daily in the summer   On her initial visit we stopped compound thyroid  hormone 7.5 mcg and continued Tirosint  100 mcg with a TSH 0.09 uIU/mL    Anti-TPO Abs elevated 565.6 01/2022   No Fh of thyroid  disease   Father with T1DM   SUBJECTIVE:    Today (08/20/23):  Shannon [name] is here for a follow up on hypothyroidism   She continues to follow-up with urogynecology, she is s/p anterior and posterior repair with perineorrhaphy cystoscopy 04/2022, she was recently evaluated again for increased urinary leakage, symptoms are improving   Pt has been noted with weight loss  Denies palpitations Denies local neck swelling   Denies tremors  Had  hx of  chronic constipation , on mg citrate  Has noted difficulty with sleeping   She is on Metal-X- Synergy that she does a few months a year, has been on it for a month   She is on Vitamin D  4000 iu daily (takes 2000 IU during the summer months) Tirosint  88 mcg , 1 tablet daily     HISTORY:  Past Medical History:  Past Medical History:  Diagnosis Date   ADHD (attention deficit hyperactivity disorder), 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 Left torn meniscus Right arthritis   Lead exposure 03/02/2018   Lumbar degenerative disc disease 02/24/2017   MRI 2019 confirms multilevel change  Sciatica at times 2/2 this and periodic radiculopathy   Major depressive disorder 03/08/2013   Melanocytic nevi of trunk    Menopause    Neck pain on left side 09/10/2018   Suspect this is DDD and DJD - XR  is pending Neurapraxic spasm to left trapezius   Neurofibroma 07/20/2019   Nuclear sclerotic cataract of both eyes 07/23/2012   OAB (overactive bladder) 08/05/2016   Osteopenia    PONV (postoperative nausea and vomiting)    Rosacea    Sciatica of left side 02/24/2017   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   By exam she has chronic tendinopathy of Glut Med   Urinary urgency 09/17/2020   Vertigo 04/12/2019   Vitamin D  deficiency 02/28/2014   Past Surgical History:  Past Surgical History:  Procedure Laterality Date   ANTERIOR AND POSTERIOR REPAIR  05/13/2022   Procedure: ANTERIOR (CYSTOCELE) AND POSTERIOR REPAIR (RECTOCELE), PERINEORRHAPHY;   Surgeon: Arma Lamp, MD;  Location: [facility];  Service: Gynecology;;   blephoroplasty     BROW LIFT     CYSTOSCOPY N/A 05/13/2022   Procedure: CYSTOSCOPY;  Surgeon: Arma Lamp, MD;  Location: [facility];  Service: Gynecology;  Laterality: N/A;   DILATION AND CURETTAGE OF UTERUS     SKIN CANCER EXCISION     right lower lip   TONSILLECTOMY AND ADENOIDECTOMY      Social History:  reports that she quit smoking about 53 years ago. Her smoking use included cigarettes. She has never used smokeless tobacco. She reports current alcohol use of about 3.0 - 4.0 standard drinks of alcohol per week. She reports that she does not use drugs. Family History: family history includes ADD / ADHD in her daughter and son; Alcohol abuse in her sister; Anxiety disorder in her brother, daughter, and son; Cancer in her sister and sister; Cancer - Ovarian in her sister; Colon cancer in her maternal grandfather; Colon polyps in her mother; Depression in her daughter and son; Diabetes in her father; Emphysema in her father; Parkinson's disease in her maternal grandmother and mother; Tuberculosis in her father.   HOME MEDICATIONS: Allergies as of 08/20/2023       Reactions   Gluten Meal Other (See Comments)   GI symptoms; has BM everytime she urinates GI symptoms; has BM everytime she urinates        Medication List        Accurate as of August 20, 2023  9:29 AM. If you have any questions, ask your nurse or doctor.          STOP taking these medications    meloxicam  15 MG tablet Commonly known as: MOBIC  Stopped by: Earlena Werst J Chakara Bognar       TAKE these medications    Acidophilus Lactobacillus 10 BIL UNT/GM Powd Take 1 capsule by mouth daily.   escitalopram  10 MG tablet Commonly known as: Lexapro  Take 1.5 tablets (15 mg total) by mouth daily.   estradiol  0.1 MG/GM vaginal cream Commonly known as: ESTRACE  INSERT ONE APPLICATORFUL VAGINALLY  TWICE A WEEK AT BEDTIME   gabapentin  300 MG capsule Commonly known as: NEURONTIN  Take 1 capsule (300 mg total) by mouth 3 (three) times daily as needed.   GAMMA AMINOBUTYRIC ACID PO Take 200 mg by mouth as needed. Up to bid   Krill Oil 1000 MG Caps   lisdexamfetamine 10 MG capsule Commonly known as: Vyvanse  Take 1 capsule (10 mg total) by mouth daily.   LORazepam  2 MG tablet Commonly known as: ATIVAN  Take 1 tablet (2 mg total) by mouth every 8 (eight) hours as needed for anxiety.   Magnesium Citrate 125 MG Caps Take by mouth as directed.   MAGNESIUM GLYCINATE PLUS PO Take 100 mg  by mouth 2 (two) times daily.   nystatin ointment Commonly known as: MYCOSTATIN   PEPTINEX DT/PREBIOTICS PO Take by mouth.   ProAir  RespiClick 108 (90 Base) MCG/ACT Aepb Generic drug: Albuterol  Sulfate Inhale 2 puffs every 4-6 hours as needed for cough and shortness of breath.   Tirosint  88 MCG Caps Generic drug: Levothyroxine  Sodium TAKE ONE Capsule BY MOUTH EVERY DAY BEFORE BREAKFAST   tretinoin 0.025 % cream Commonly known as: RETIN-A Apply 0.025 % topically daily. Pea size amount          REVIEW OF SYSTEMS: A comprehensive ROS was conducted with the patient and is negative except as per HPI     OBJECTIVE:  VS: BP 120/76 (BP Location: Left Arm, Patient Position: Sitting, Cuff Size: Normal)   Pulse (!) 57   Ht 5\' 5"  (1.651 m)   Wt 166 lb (75.3 kg)   LMP 03/29/2004   SpO2 97%   BMI 27.62 kg/m    Wt Readings from Last 3 Encounters:  08/20/23 166 lb (75.3 kg)  06/10/23 171 lb (77.6 kg)  05/20/23 171 lb 9.6 oz (77.8 kg)     EXAM: General: Pt appears well and is in NAD  Neck: General: Supple without adenopathy. Thyroid : Thyroid  size normal.  No goiter or nodules appreciated.  Lungs: Clear with good BS bilat   Heart: Auscultation: RRR.  Abdomen: soft, nontender  Extremities:  BL LE: No pretibial edema   Mental Status: Judgment, insight: Intact Orientation: Oriented  to time, place, and person Mood and affect: No depression, anxiety, or agitation     DATA REVIEWED:   Latest Reference Range & Units 02/19/23 10:52  VITD 30.00 - 100.00 ng/mL 71.74     Latest Reference Range & Units 02/19/23 10:52  TSH 0.35 - 5.50 uIU/mL 1.27  T4,Free(Direct) 0.60 - 1.60 ng/dL 1.61    01/02/453 ATI-TPO Ab s 565.6  (reference <100)   ASSESSMENT/PLAN/RECOMMENDATIONS:   Hashimoto's Thyroiditis :  -Patient is clinically euthyroid -No local neck symptoms -TSH within normal range  Medications : Continue Tirosint  88 mcg daily  2. Hx vitamin D  deficiency:  -Vitamin D  continues to be within the normal range -She alternates vitamin dose during summer and winter months -Currently  is on 4000 IU daily, no change  Follow-up in 6 months    Signed electronically by: Natale Bail, MD  [facility] Endocrinology  [facility] [address] [place] [place], [place] [address] Phone: [phone] FAX: [phone]   CC: Neda Balk, MD [address] [place] [address] Phone: [phone] Fax: [phone]   Return to Endocrinology clinic as below: Future Appointments  Date Time Provider Department Center  08/28/2023  1:20 PM Neda Balk, MD [facility]  09/11/2023 10:50 AM [facility] ANNUAL WELLNESS VISIT 2 [facility]

## 2023-08-20 NOTE — Patient Instructions (Signed)

## 2023-08-20 NOTE — Telephone Encounter (Signed)
 Pharmacy Patient Advocate Encounter  Received notification from [employer] that Prior Authorization for LISDEXAMFETAMINE 10 CAPS has been DENIED.  Full denial letter will be uploaded to the media tab. See denial reason below.   PA #/Case ID/Reference #: [id number]

## 2023-08-21 ENCOUNTER — Encounter: Payer: Self-pay | Admitting: Internal Medicine

## 2023-08-24 NOTE — Assessment & Plan Note (Signed)
 Supplement and monitor

## 2023-08-24 NOTE — Assessment & Plan Note (Signed)
 Encourage heart healthy diet such as MIND or DASH diet, increase exercise, avoid trans fats, simple carbohydrates and processed foods, consider a krill or fish or flaxseed oil cap daily.

## 2023-08-24 NOTE — Assessment & Plan Note (Signed)
 Have changed medications recently to Vyvanse  after repeat formal evaluation

## 2023-08-24 NOTE — Assessment & Plan Note (Signed)
 [id number] acceptable, minimize simple carbs. Increase exercise as tolerated.

## 2023-08-24 NOTE — Assessment & Plan Note (Signed)
 On Levothyroxine, continue to monitor

## 2023-08-28 ENCOUNTER — Ambulatory Visit (INDEPENDENT_AMBULATORY_CARE_PROVIDER_SITE_OTHER): Payer: Medicare Other | Admitting: Family Medicine

## 2023-08-28 ENCOUNTER — Encounter: Payer: Self-pay | Admitting: Family Medicine

## 2023-08-28 ENCOUNTER — Other Ambulatory Visit (HOSPITAL_BASED_OUTPATIENT_CLINIC_OR_DEPARTMENT_OTHER): Payer: Self-pay

## 2023-08-28 VITALS — BP 118/78 | HR 66 | Resp 16 | Ht 65.0 in | Wt 165.0 lb

## 2023-08-28 DIAGNOSIS — E039 Hypothyroidism, unspecified: Secondary | ICD-10-CM | POA: Diagnosis not present

## 2023-08-28 DIAGNOSIS — R350 Frequency of micturition: Secondary | ICD-10-CM

## 2023-08-28 DIAGNOSIS — R739 Hyperglycemia, unspecified: Secondary | ICD-10-CM

## 2023-08-28 DIAGNOSIS — E559 Vitamin D deficiency, unspecified: Secondary | ICD-10-CM | POA: Diagnosis not present

## 2023-08-28 DIAGNOSIS — E782 Mixed hyperlipidemia: Secondary | ICD-10-CM

## 2023-08-28 DIAGNOSIS — F900 Attention-deficit hyperactivity disorder, predominantly inattentive type: Secondary | ICD-10-CM | POA: Diagnosis not present

## 2023-08-28 NOTE — Patient Instructions (Addendum)
 Afrin nasal spray,breath rite strips, nasal saline spray.

## 2023-08-29 ENCOUNTER — Telehealth: Payer: Self-pay | Admitting: Family Medicine

## 2023-08-29 LAB — LIPID PANEL
Cholesterol: 204 mg/dL — ABNORMAL HIGH (ref 0–200)
HDL: 58.2 mg/dL (ref >39.00)
LDL Cholesterol: 135 mg/dL — ABNORMAL HIGH (ref 0–99)
NonHDL: 146.11
Total CHOL/HDL Ratio: 4
Triglycerides: 58 mg/dL (ref 0.0–149.0)
VLDL: 11.6 mg/dL (ref 0.0–40.0)

## 2023-08-29 LAB — CBC WITH DIFFERENTIAL/PLATELET
Basophils Absolute: 0 10*3/uL (ref 0.0–0.1)
Basophils Relative: 0.8 % (ref 0.0–3.0)
Eosinophils Absolute: 0.1 10*3/uL (ref 0.0–0.7)
Eosinophils Relative: 2.6 % (ref 0.0–5.0)
HCT: 39.6 % (ref 36.0–46.0)
Hemoglobin: 13.3 g/dL (ref 12.0–15.0)
Lymphocytes Relative: 29.2 % (ref 12.0–46.0)
Lymphs Abs: 1.3 10*3/uL (ref 0.7–4.0)
MCHC: 33.6 g/dL (ref 30.0–36.0)
MCV: 102.8 fl — ABNORMAL HIGH (ref 78.0–100.0)
Monocytes Absolute: 0.4 10*3/uL (ref 0.1–1.0)
Monocytes Relative: 8 % (ref 3.0–12.0)
Neutro Abs: 2.7 10*3/uL (ref 1.4–7.7)
Neutrophils Relative %: 59.4 % (ref 43.0–77.0)
Platelets: 273 10*3/uL (ref 150.0–400.0)
RBC: 3.86 Mil/uL — ABNORMAL LOW (ref 3.87–5.11)
RDW: 12.5 % (ref 11.5–15.5)
WBC: 4.5 10*3/uL (ref 4.0–10.5)

## 2023-08-29 LAB — COMPREHENSIVE METABOLIC PANEL WITH GFR
ALT: 14 U/L (ref 0–35)
AST: 21 U/L (ref 0–37)
Albumin: 4.3 g/dL (ref 3.5–5.2)
Alkaline Phosphatase: 70 U/L (ref 39–117)
BUN: 18 mg/dL (ref 6–23)
CO2: 28 meq/L (ref 19–32)
Calcium: 9.6 mg/dL (ref 8.4–10.5)
Chloride: 104 meq/L (ref 96–112)
Creatinine, Ser: 0.94 mg/dL (ref 0.40–1.20)
GFR: 60.43 mL/min (ref >60.00)
Glucose, Bld: 87 mg/dL (ref 70–99)
Potassium: 5 meq/L (ref 3.5–5.1)
Sodium: 140 meq/L (ref 135–145)
Total Bilirubin: 1 mg/dL (ref 0.2–1.2)
Total Protein: 6.6 g/dL (ref 6.0–8.3)

## 2023-08-29 LAB — URINE CULTURE
MICRO NUMBER:: 16400933
Result:: NO GROWTH
SPECIMEN QUALITY:: ADEQUATE

## 2023-08-29 LAB — HEMOGLOBIN A1C: Hgb A1c MFr Bld: 5.5 % (ref 4.6–6.5)

## 2023-08-29 LAB — VITAMIN D 25 HYDROXY (VIT D DEFICIENCY, FRACTURES): VITD: 80.79 ng/mL (ref 30.00–100.00)

## 2023-08-29 NOTE — Telephone Encounter (Signed)
 Need to check on Monday if the pt udip was [facility].  If not we need to call a reschedule

## 2023-08-30 ENCOUNTER — Encounter: Payer: Self-pay | Admitting: Family Medicine

## 2023-08-31 ENCOUNTER — Encounter: Payer: Self-pay | Admitting: Family Medicine

## 2023-08-31 NOTE — Progress Notes (Signed)
 Subjective:    Patient ID: Shannon [name], female    DOB: [birth year], 73 y.o.   MRN: [id number]  Chief Complaint  Patient presents with  . Medical Management of Chronic Issues    Patient presents today for a follow-up appointment.  . Quality Metric Gaps    AWV    HPI Discussed the use of AI scribe software for clinical note transcription with the patient, who gave verbal consent to proceed.  History of Present Illness Shannon [name] is a 73 year old female who presents for a discussion about advance directives and snoring issues.  She is seeking clarification and information regarding advance directives, specifically the MOST form, healthcare power of attorney, and living will. She has a living will and healthcare power of attorney in place but is considering changes due to communication issues with her current designees. She is contemplating asking her brother to be her healthcare power of attorney due to her children's unavailability.  She has a history of snoring, which has become more noticeable and bothersome during recent travels. Her travel companions and family members have informed her that she snores regardless of her sleeping position. She is concerned about snoring during an upcoming hiking trip where she will share a room with someone she does not know well. She has tried nasal sprays and is considering other over-the-counter options to manage her snoring.  She is currently taking Vyvanse , which she finds somewhat effective in helping her stay on top of tasks, although she does not feel medicated. She mentions the cost of Vyvanse  and is exploring different pharmacies for better pricing.  She has a history of thyroid  issues, which tend to fluctuate with the seasons, requiring adjustments in her medication. She recently had her thyroid  levels checked and adjusted her medication accordingly.  She has experienced episodes of photosensitivity, resulting in rashes and swelling after sun  exposure, despite using sunscreen. She is considering trying different sunscreens to manage this issue.  She has a history of urinary incontinence and urgency, which improved after a bulking procedure but still persists to some extent.    Past Medical History:  Diagnosis Date  . ADHD (attention deficit hyperactivity disorder), 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  . Anxiety   . Asthma   . Basal cell carcinoma of face   . Benign neoplasm of skin of lower limb   . Bilateral carotid bruits 04/12/2019  . Diverticulosis   . Gestational diabetes   . Hammer toe, acquired 03/15/2009  . Heart murmur 03/02/2018  . History of COVID-19 09/14/2020  . Hyperglycemia 02/24/2017  . Hyperlipidemia, mixed 03/11/2016  . Hypothyroidism   . Knee pain 09/26/2010   Crepitation on the right and minimal on the left. Left torn meniscus Right arthritis  . Lead exposure 03/02/2018  . Lumbar degenerative disc disease 02/24/2017   MRI 2019 confirms multilevel change  Sciatica at times 2/2 this and periodic radiculopathy  . Major depressive disorder 03/08/2013  . Melanocytic nevi of trunk   . Menopause   . Neck pain on left side 09/10/2018   Suspect this is DDD and DJD - XR is pending Neurapraxic spasm to left trapezius  . Neurofibroma 07/20/2019  . Nuclear sclerotic cataract of both eyes 07/23/2012  . OAB (overactive bladder) 08/05/2016  . Osteopenia   . PONV (postoperative nausea and vomiting)   . Rosacea   . Sciatica of left side 02/24/2017  . 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   By exam she has chronic tendinopathy  of Glut Med  . Urinary urgency 09/17/2020  . Vertigo 04/12/2019  . Vitamin D  deficiency 02/28/2014    Past Surgical History:  Procedure Laterality Date  . ANTERIOR AND POSTERIOR REPAIR  05/13/2022   Procedure: ANTERIOR (CYSTOCELE) AND POSTERIOR REPAIR (RECTOCELE), PERINEORRHAPHY;  Surgeon: Arma Lamp, MD;  Location: [facility] [place];  Service: Gynecology;;  .  blephoroplasty    . BROW LIFT    . CYSTOSCOPY N/A 05/13/2022   Procedure: CYSTOSCOPY;  Surgeon: Arma Lamp, MD;  Location: [facility];  Service: Gynecology;  Laterality: N/A;  . DILATION AND CURETTAGE OF UTERUS    . SKIN CANCER EXCISION     right lower lip  . TONSILLECTOMY AND ADENOIDECTOMY      Family History  Problem Relation Age of Onset  . Colon polyps Mother   . Parkinson's disease Mother        developed Parkinson's disease dementia  . Diabetes Father   . Emphysema Father   . Tuberculosis Father   . Alcohol abuse Sister   . Cancer Sister        skin cancer, melanoma cell, basil cell  . Cancer Sister        melanoma  . Cancer - Ovarian Sister   . Anxiety disorder Brother   . Parkinson's disease Maternal Grandmother        developed Parkinson's disease dementia  . Colon cancer Maternal Grandfather   . ADD / ADHD Daughter   . Anxiety disorder Daughter   . Depression Daughter   . ADD / ADHD Son   . Anxiety disorder Son   . Depression Son   . Bladder Cancer Neg Hx   . Uterine cancer Neg Hx     Social History   Socioeconomic History  . Marital status: Single    Spouse name: Not on file  . Number of children: 2  . Years of education: 25  . Highest education level: Master's degree (e.g., MA, MS, MEng, MEd, MSW, MBA)  Occupational History  . Occupation: Retired    Comment: Early Personal assistant  Tobacco Use  . Smoking status: Former    Current packs/day: 0.00    Types: Cigarettes    Quit date: 08/20/1970    Years since quitting: 53.0  . Smokeless tobacco: Never  Vaping Use  . Vaping status: Never Used  Substance and Sexual Activity  . Alcohol use: Yes    Alcohol/week: 3.0 - 4.0 standard drinks of alcohol    Types: 1 - 2 Glasses of wine, 1 Cans of beer, 1 Shots of liquor per week    Comment: not every night.  . Drug use: No  . Sexual activity: Not Currently  Other Topics Concern  . Not on file  Social History Narrative   Retired  from [employer].   No major dietary restrictions except avoid gluten.       Social Drivers of Corporate investment banker Strain: Low Risk  (08/27/2023)   Overall Financial Resource Strain (CARDIA)   . Difficulty of Paying Living Expenses: Not hard at all  Food Insecurity: No Food Insecurity (08/27/2023)   Hunger Vital Sign   . Worried About Programme researcher, broadcasting/film/video in the Last Year: Never true   . Ran Out of Food in the Last Year: Never true  Transportation Needs: No Transportation Needs (08/27/2023)   PRAPARE - Transportation   . Lack of Transportation (Medical): No   . Lack of Transportation (Non-Medical): No  Physical Activity: Insufficiently Active (08/27/2023)   Exercise Vital Sign   . Days of Exercise per Week: 2 days   . Minutes of Exercise per Session: 60 min  Stress: Stress Concern Present (08/27/2023)   [employer] of Occupational Health - Occupational Stress Questionnaire   . Feeling of Stress : To some extent  Social Connections: Moderately Integrated (08/27/2023)   Social Connection and Isolation Panel [NHANES]   . Frequency of Communication with Friends and Family: More than three times a week   . Frequency of Social Gatherings with Friends and Family: Twice a week   . Attends Religious Services: More than 4 times per year   . Active Member of Clubs or Organizations: Yes   . Attends Banker Meetings: More than 4 times per year   . Marital Status: Divorced  Catering manager Violence: Not At Risk (08/01/2022)   Humiliation, Afraid, Rape, and Kick questionnaire   . Fear of Current or Ex-Partner: No   . Emotionally Abused: No   . Physically Abused: No   . Sexually Abused: No    Outpatient Medications Prior to Visit  Medication Sig Dispense Refill  . Albuterol  Sulfate (PROAIR  RESPICLICK) 108 (90 Base) MCG/ACT AEPB Inhale 2 puffs every 4-6 hours as needed for cough and shortness of breath. 1 each 0  . escitalopram  (LEXAPRO ) 10 MG tablet Take 1.5  tablets (15 mg total) by mouth daily. 135 tablet 1  . estradiol  (ESTRACE ) 0.1 MG/GM vaginal cream INSERT ONE APPLICATORFUL VAGINALLY TWICE A WEEK AT BEDTIME 42.5 g 1  . gabapentin  (NEURONTIN ) 300 MG capsule Take 1 capsule (300 mg total) by mouth 3 (three) times daily as needed.    Aaron Aas GAMMA AMINOBUTYRIC ACID PO Take 200 mg by mouth as needed. Up to bid    . Krill Oil 1000 MG CAPS     . Lactobacillus Acidophilus (ACIDOPHILUS LACTOBACILLUS) 10 BIL UNT/GM POWD Take 1 capsule by mouth daily.    Aaron Aas lisdexamfetamine (VYVANSE ) 10 MG capsule Take 1 capsule (10 mg total) by mouth daily. 30 capsule 0  . LORazepam  (ATIVAN ) 2 MG tablet Take 1 tablet (2 mg total) by mouth every 8 (eight) hours as needed for anxiety. 40 tablet 1  . Magnesium Citrate 125 MG CAPS Take by mouth as directed.    Aaron Aas MAGNESIUM GLYCINATE PLUS PO Take 100 mg by mouth 2 (two) times daily.    . Nutritional Supplements (PEPTINEX DT/PREBIOTICS PO) Take by mouth.    . nystatin ointment (MYCOSTATIN)     . TIROSINT  88 MCG CAPS TAKE ONE Capsule BY MOUTH EVERY DAY BEFORE BREAKFAST 90 capsule 1  . tretinoin (RETIN-A) 0.025 % cream Apply 0.025 % topically daily. Pea size amount     No facility-administered medications prior to visit.    Allergies  Allergen Reactions  . Gluten Meal Other (See Comments)    GI symptoms; has BM everytime she urinates GI symptoms; has BM everytime she urinates    Review of Systems  Constitutional:  Negative for fever and malaise/fatigue.  HENT:  Negative for congestion.   Eyes:  Negative for blurred vision.  Respiratory:  Negative for shortness of breath.   Cardiovascular:  Negative for chest pain, palpitations and leg swelling.  Gastrointestinal:  Negative for abdominal pain, blood in stool and nausea.  Genitourinary:  Positive for frequency and urgency. Negative for dysuria, flank pain and hematuria.  Musculoskeletal:  Negative for falls.  Skin:  Positive for rash.  Neurological:  Negative for dizziness,  loss of consciousness and headaches.  Endo/Heme/Allergies:  Negative for environmental allergies.  Psychiatric/Behavioral:  Negative for depression. The patient is not nervous/anxious.        Objective:    Physical Exam Constitutional:      General: She is not in acute distress.    Appearance: Normal appearance. She is well-developed. She is not toxic-appearing.  HENT:     Head: Normocephalic and atraumatic.     Right Ear: External ear normal.     Left Ear: External ear normal.     Nose: Nose normal.  Eyes:     General:        Right eye: No discharge.        Left eye: No discharge.     Conjunctiva/sclera: Conjunctivae normal.  Neck:     Thyroid : No thyromegaly.  Cardiovascular:     Rate and Rhythm: Normal rate and regular rhythm.     Heart sounds: Normal heart sounds. No murmur heard. Pulmonary:     Effort: Pulmonary effort is normal. No respiratory distress.     Breath sounds: Normal breath sounds.  Abdominal:     General: Bowel sounds are normal.     Palpations: Abdomen is soft.     Tenderness: There is no abdominal tenderness. There is no guarding.  Musculoskeletal:        General: Normal range of motion.     Cervical back: Neck supple.  Lymphadenopathy:     Cervical: No cervical adenopathy.  Skin:    General: Skin is warm and dry.  Neurological:     Mental Status: She is alert and oriented to person, place, and time.  Psychiatric:        Mood and Affect: Mood normal.        Behavior: Behavior normal.        Thought Content: Thought content normal.        Judgment: Judgment normal.    BP 118/78   Pulse 66   Resp 16   Ht 5\' 5"  (1.651 m)   Wt 165 lb (74.8 kg)   LMP 03/29/2004   SpO2 95%   BMI 27.46 kg/m  Wt Readings from Last 3 Encounters:  08/28/23 165 lb (74.8 kg)  08/20/23 166 lb (75.3 kg)  06/10/23 171 lb (77.6 kg)    Diabetic Foot Exam - Simple   No data filed    Lab Results  Component Value Date   WBC 4.5 08/28/2023   HGB 13.3 08/28/2023    HCT 39.6 08/28/2023   PLT 273.0 08/28/2023   GLUCOSE 87 08/28/2023   CHOL 204 (H) 08/28/2023   TRIG 58.0 08/28/2023   HDL 58.20 08/28/2023   LDLCALC 135 (H) 08/28/2023   ALT 14 08/28/2023   AST 21 08/28/2023   NA 140 08/28/2023   K 5.0 08/28/2023   CL 104 08/28/2023   CREATININE 0.94 08/28/2023   BUN 18 08/28/2023   CO2 28 08/28/2023   TSH 0.75 08/20/2023   HGBA1C 5.5 08/28/2023   MICROALBUR 0.2 02/28/2014    Lab Results  Component Value Date   TSH 0.75 08/20/2023   Lab Results  Component Value Date   WBC 4.5 08/28/2023   HGB 13.3 08/28/2023   HCT 39.6 08/28/2023   MCV 102.8 (H) 08/28/2023   PLT 273.0 08/28/2023   Lab Results  Component Value Date   NA 140 08/28/2023   K 5.0 08/28/2023   CO2 28 08/28/2023   GLUCOSE 87 08/28/2023   BUN 18  08/28/2023   CREATININE 0.94 08/28/2023   BILITOT 1.0 08/28/2023   ALKPHOS 70 08/28/2023   AST 21 08/28/2023   ALT 14 08/28/2023   PROT 6.6 08/28/2023   ALBUMIN 4.3 08/28/2023   CALCIUM 9.6 08/28/2023   GFR 60.43 08/28/2023   Lab Results  Component Value Date   CHOL 204 (H) 08/28/2023   Lab Results  Component Value Date   HDL 58.20 08/28/2023   Lab Results  Component Value Date   LDLCALC 135 (H) 08/28/2023   Lab Results  Component Value Date   TRIG 58.0 08/28/2023   Lab Results  Component Value Date   CHOLHDL 4 08/28/2023   Lab Results  Component Value Date   HGBA1C 5.5 08/28/2023       Assessment & Plan:  ADHD (attention deficit hyperactivity disorder), inattentive type Assessment & Plan: Have changed medications recently to Vyvanse  after repeat formal evaluation  Orders: -     CBC with Differential/Platelet  Hyperglycemia Assessment & Plan: hgba1c acceptable, minimize simple carbs. Increase exercise as tolerated.   Orders: -     Comprehensive metabolic panel with GFR -     CBC with Differential/Platelet -     Hemoglobin A1c  Hyperlipidemia, mixed Assessment & Plan: Encourage heart  healthy diet such as MIND or DASH diet, increase exercise, avoid trans fats, simple carbohydrates and processed foods, consider a krill or fish or flaxseed oil cap daily.    Orders: -     Lipid panel -     CBC with Differential/Platelet  Hypothyroidism, unspecified type Assessment & Plan: On Levothyroxine , continue to monitor   Orders: -     CBC with Differential/Platelet  Vitamin D  deficiency Assessment & Plan: Supplement and monitor   Orders: -     CBC with Differential/Platelet -     VITAMIN D  25 Hydroxy (Vit-D Deficiency, Fractures)  Urinary frequency -     Urinalysis, Routine w reflex microscopic -     Urine Culture    Assessment and Plan Assessment & Plan ADHD, inattentive type Vyvanse  is difficult to obtain and expensive. She feels it Braydee be helping but is considering increasing the dose. - Consider increasing Vyvanse  dose after discussing with the prescribing provider. - Explore pharmacy options for better pricing, including [facility] pharmacies and using [facility].  Hypothyroidism, unspecified Thyroid  levels checked recently by another provider. Medication adjustments are made seasonally due to changes in sunlight exposure. - Continue current thyroid  management plan with seasonal adjustments as needed.  Snoring Snoring occurs regardless of sleeping position. Concerned about snoring while sharing a room during upcoming travel. - Use Afrin nasal spray at night for up to three days to reduce mucous membrane swelling. - Use Breathe Right nasal strips to aid in opening airways. - Keep nasal saline spray on hand to flush out irritants if needed.  Goals of Care She has a healthcare power of attorney and a living will. Prefers full resuscitation and treatment if there is a reasonable chance of recovery. Considering changing healthcare power of attorney due to communication issues. - Review and update healthcare power of attorney and living will as needed. - Consider carrying  copies of advanced directives while traveling. - Discuss preferences with healthcare power of attorney to ensure understanding of her wishes.     Randie Bustle, MD

## 2023-09-02 ENCOUNTER — Encounter: Payer: Self-pay | Admitting: Obstetrics and Gynecology

## 2023-09-02 ENCOUNTER — Encounter: Payer: Self-pay | Admitting: Family Medicine

## 2023-09-03 ENCOUNTER — Other Ambulatory Visit: Payer: Self-pay | Admitting: Emergency Medicine

## 2023-09-03 MED ORDER — ESCITALOPRAM OXALATE 10 MG PO TABS: 15.0000 mg | ORAL_TABLET | Freq: Every day | ORAL | 1 refills | Status: DC

## 2023-09-03 MED ORDER — ESTRADIOL 0.1 MG/GM VA CREA: TOPICAL_CREAM | VAGINAL | 1 refills | Status: DC

## 2023-09-03 NOTE — Telephone Encounter (Signed)
 Dr Rodrick Clapper,  It looks like the urine culture was sent out but the u/a did not get sent out.  Do we need to re-collect the u/a that was originally ordered?

## 2023-09-04 ENCOUNTER — Encounter (HOSPITAL_COMMUNITY): Payer: Self-pay

## 2023-09-10 DIAGNOSIS — M542 Cervicalgia: Secondary | ICD-10-CM | POA: Diagnosis not present

## 2023-09-10 DIAGNOSIS — M5441 Lumbago with sciatica, right side: Secondary | ICD-10-CM | POA: Diagnosis not present

## 2023-09-11 ENCOUNTER — Encounter: Payer: Self-pay | Admitting: Family Medicine

## 2023-09-11 ENCOUNTER — Ambulatory Visit

## 2023-09-11 VITALS — Ht 65.0 in | Wt 165.0 lb

## 2023-09-11 DIAGNOSIS — Z0000 Encounter for general adult medical examination without abnormal findings: Secondary | ICD-10-CM

## 2023-09-11 NOTE — Progress Notes (Signed)
 Subjective:   Shannon [name] is a 73 y.o. who presents for a Medicare Wellness preventive visit.  As a reminder, Annual Wellness Visits don't include a physical exam, and some assessments Caley be limited, especially if this visit is performed virtually. We Shyana recommend an in-person visit if needed.  Visit Complete: Virtual I connected with  Shannon [name] on 09/11/23 by a audio enabled telemedicine application and verified that I am speaking with the correct person using two identifiers.  Patient Location: Home  Provider Location: Home Office  I discussed the limitations of evaluation and management by telemedicine. The patient expressed understanding and agreed to proceed.  Vital Signs: Because this visit was a virtual/telehealth visit, some criteria Nzinga be missing or patient reported. Any vitals not documented were not able to be obtained and vitals that have been documented are patient reported.    Persons Participating in Visit: Patient.  AWV Questionnaire: No: Patient Medicare AWV questionnaire was not completed prior to this visit.  Cardiac Risk Factors include: advanced age (>78men, >34 women)     Objective:     Today's Vitals   09/11/23 1100  Weight: 165 lb (74.8 kg)  Height: 5\' 5"  (1.651 m)   Body mass index is 27.46 kg/m.     09/11/2023   11:09 AM 08/01/2022    2:19 PM 05/13/2022    8:04 AM 07/19/2021    3:18 PM 07/13/2020    3:32 PM 03/30/2019    3:16 PM 03/12/2018    2:38 PM  Advanced Directives  Does Patient Have a Medical Advance Directive? Yes Yes Yes Yes Yes Yes Yes  Type of Estate agent of [place];Living will Living will;Healthcare Power of [employer] Power of [place];Living will Healthcare Power of [place];Living will;Out of facility DNR (pink MOST or yellow form) Healthcare Power of [place];Living will Healthcare Power of [place];Living will Healthcare Power of [place];Living will  Does patient want to make changes to medical  advance directive? No - Patient declined No - Patient declined No - Patient declined No - Patient declined  No - Patient declined   Copy of Healthcare Power of Attorney in Chart? Yes - validated most recent copy scanned in chart (See row information) Yes - validated most recent copy scanned in chart (See row information)  Yes - validated most recent copy scanned in chart (See row information) Yes - validated most recent copy scanned in chart (See row information) Yes - validated most recent copy scanned in chart (See row information) Yes - validated most recent copy scanned in chart (See row information)    Current Medications (verified) Outpatient Encounter Medications as of 09/11/2023  Medication Sig   Albuterol  Sulfate (PROAIR  RESPICLICK) 108 (90 Base) MCG/ACT AEPB Inhale 2 puffs every 4-6 hours as needed for cough and shortness of breath.   escitalopram  (LEXAPRO ) 10 MG tablet Take 1.5 tablets (15 mg total) by mouth daily.   estradiol  (ESTRACE ) 0.1 MG/GM vaginal cream INSERT ONE APPLICATORFUL VAGINALLY TWICE A WEEK AT BEDTIME   gabapentin  (NEURONTIN ) 300 MG capsule Take 1 capsule (300 mg total) by mouth 3 (three) times daily as needed.   GAMMA AMINOBUTYRIC ACID PO Take 200 mg by mouth as needed. Up to bid   Krill Oil 1000 MG CAPS    Lactobacillus Acidophilus (ACIDOPHILUS LACTOBACILLUS) 10 BIL UNT/GM POWD Take 1 capsule by mouth daily.   lisdexamfetamine (VYVANSE ) 10 MG capsule Take 1 capsule (10 mg total) by mouth daily.   LORazepam  (ATIVAN ) 2 MG  tablet Take 1 tablet (2 mg total) by mouth every 8 (eight) hours as needed for anxiety.   Magnesium Citrate 125 MG CAPS Take by mouth as directed.   MAGNESIUM GLYCINATE PLUS PO Take 100 mg by mouth 2 (two) times daily.   Nutritional Supplements (PEPTINEX DT/PREBIOTICS PO) Take by mouth.   nystatin ointment (MYCOSTATIN)    TIROSINT  88 MCG CAPS TAKE ONE Capsule BY MOUTH EVERY DAY BEFORE BREAKFAST   tretinoin (RETIN-A) 0.025 % cream Apply 0.025 %  topically daily. Pea size amount   No facility-administered encounter medications on file as of 09/11/2023.    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 Left torn meniscus Right arthritis   Lead exposure 03/02/2018   Lumbar degenerative disc disease 02/24/2017   MRI 2019 confirms multilevel change  Sciatica at times 2/2 this and periodic radiculopathy   Major depressive disorder 03/08/2013   Melanocytic nevi of trunk    Menopause    Neck pain on left side 09/10/2018   Suspect this is DDD and DJD - XR is pending Neurapraxic spasm to left trapezius   Neurofibroma 07/20/2019   Nuclear sclerotic cataract of both eyes 07/23/2012   OAB (overactive bladder) 08/05/2016   Osteopenia    PONV (postoperative nausea and vomiting)    Rosacea    Sciatica of left side 02/24/2017   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   By exam she has chronic tendinopathy of Glut Med   Urinary urgency 09/17/2020   Vertigo 04/12/2019   Vitamin D  deficiency 02/28/2014   Past Surgical History:  Procedure Laterality Date   ANTERIOR AND POSTERIOR REPAIR  05/13/2022   Procedure: ANTERIOR (CYSTOCELE) AND POSTERIOR REPAIR (RECTOCELE), PERINEORRHAPHY;  Surgeon: Arma Lamp, MD;  Location: [facility];  Service: Gynecology;;   blephoroplasty     BROW LIFT     CYSTOSCOPY N/A 05/13/2022   Procedure: CYSTOSCOPY;  Surgeon: Arma Lamp,  MD;  Location: [facility];  Service: Gynecology;  Laterality: N/A;   DILATION AND CURETTAGE OF UTERUS     SKIN CANCER EXCISION     right lower lip   TONSILLECTOMY AND ADENOIDECTOMY     Family History  Problem Relation Age of Onset   Colon polyps Mother    Parkinson's disease Mother        developed Parkinson's disease dementia   Diabetes Father    Emphysema Father    Tuberculosis Father    Alcohol abuse Sister    Cancer Sister        skin cancer, melanoma cell, basil cell   Cancer Sister        melanoma   Cancer - Ovarian Sister    Anxiety disorder Brother    Parkinson's disease Maternal Grandmother        developed Parkinson's disease dementia   Colon cancer Maternal Grandfather    ADD / ADHD Daughter  Anxiety disorder Daughter    Depression Daughter    ADD / ADHD Son    Anxiety disorder Son    Depression Son    Bladder Cancer Neg Hx    Uterine cancer Neg Hx    Social History   Socioeconomic History   Marital status: Single    Spouse name: Not on file   Number of children: 2   Years of education: 46   Highest education level: Master's degree (e.g., MA, MS, MEng, MEd, MSW, MBA)  Occupational History   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   Drug use: No   Sexual activity: Not Currently  Other Topics Concern   Not on file  Social History Narrative   Retired from [employer].   No major dietary restrictions except avoid gluten.       Social Drivers of Corporate investment banker Strain: Low Risk   (09/11/2023)   Overall Financial Resource Strain (CARDIA)    Difficulty of Paying Living Expenses: Not hard at all  Food Insecurity: No Food Insecurity (09/11/2023)   Hunger Vital Sign    Worried About Running Out of Food in the Last Year: Never true    Ran Out of Food in the Last Year: Never true  Transportation Needs: No Transportation Needs (09/11/2023)   PRAPARE - Administrator, Civil Service (Medical): No    Lack of Transportation (Non-Medical): No  Physical Activity: Insufficiently Active (09/11/2023)   Exercise Vital Sign    Days of Exercise per Week: 3 days    Minutes of Exercise per Session: 40 min  Stress: No Stress Concern Present (09/11/2023)   [employer] of Occupational Health - Occupational Stress Questionnaire    Feeling of Stress : Not at all  Recent Concern: Stress - Stress Concern Present (08/27/2023)   [employer] of Occupational Health - Occupational Stress Questionnaire    Feeling of Stress : To some extent  Social Connections: Moderately Integrated (09/11/2023)   Social Connection and Isolation Panel [NHANES]    Frequency of Communication with Friends and Family: More than three times a week    Frequency of Social Gatherings with Friends and Family: More than three times a week    Attends Religious Services: More than 4 times per year    Active Member of [employer] or Organizations: Yes    Attends Engineer, structural: More than 4 times per year    Marital Status: Divorced    Tobacco Counseling Counseling given: Yes    Clinical Intake:  Pre-visit preparation completed: Yes  Pain : No/denies pain     BMI - recorded: 27.46 Nutritional Status: BMI 25 -29 Overweight Nutritional Risks: None Diabetes: No  Lab Results  Component Value Date   HGBA1C 5.5 08/28/2023   HGBA1C 5.4 09/04/2022   HGBA1C 5.5 03/05/2022     How often do you need to have someone help you when you  read instructions, pamphlets, or other written materials  from your doctor or pharmacy?: 1 - Never  Interpreter Needed?: No  Information entered by :: Farris Hong LPN   Activities of Daily Living     09/11/2023   11:07 AM  In your present state of health, do you have any difficulty performing the following activities:  Hearing? 0  Vision? 0  Difficulty concentrating or making decisions? 0  Walking or climbing stairs? 0  Dressing or bathing? 0  Doing errands, shopping? 0  Preparing Food and eating ? N  Using the Toilet? N  In the past six months, have you accidently leaked urine? Y  Comment Wears Pads and Breifs, followed by medical attention  Do you have problems with loss of bowel control? N  Managing your Medications? N  Managing your Finances? N  Housekeeping or managing your Housekeeping? N    Patient Care Team: Neda Balk, MD as PCP - General (Family Medicine)  Indicate any recent Medical Services you Shaquille have received from other than [facility] providers in the past year (date Victorina be approximate).     Assessment:    This is a routine wellness examination for Shannon [name].  Hearing/Vision screen Hearing Screening - Comments:: Denies hearing difficulties   Vision Screening - Comments:: Wears rx glasses - up to date with routine eye exams with  [facility]   Goals Addressed               This Visit's Progress     Increase physical activity (pt-stated)        Remain active       Depression Screen     09/11/2023   11:05 AM 09/02/2022   10:44 AM 08/01/2022    2:30 PM 03/05/2022    2:36 PM 08/23/2021    1:35 PM 07/19/2021    3:28 PM 09/14/2020   11:25 AM  PHQ 2/9 Scores  PHQ - 2 Score 0 1 0 0 0 0 0  PHQ- 9 Score  3  0 0      Fall Risk     09/11/2023   11:08 AM 09/02/2022   10:44 AM 07/31/2022    2:43 PM 03/05/2022    2:36 PM 08/23/2021    1:35 PM  Fall Risk   Falls in the past year? 0 0 1 0 1  Number falls in past yr: 0 0 1 0 0  Injury with Fall? 0 0 0 0 1  Risk for fall due to : No Fall Risks  History of fall(s)   History of fall(s)  Follow up Falls prevention discussed;Falls evaluation completed Falls evaluation completed Falls evaluation completed Falls evaluation completed Falls evaluation completed    MEDICARE RISK AT HOME:  Medicare Risk at Home Any stairs in or around the home?: Yes If so, are there any without handrails?: No Home free of loose throw rugs in walkways, pet beds, electrical cords, etc?: Yes Adequate lighting in your home to reduce risk of falls?: Yes Life alert?: No Use of a cane, walker or w/c?: No Grab bars in the bathroom?: No Shower chair or bench in shower?: Yes Elevated toilet seat or a handicapped toilet?: Yes  TIMED UP AND GO:  Was the test performed?  No  Cognitive Function: 6CIT completed        09/11/2023   11:09 AM 08/01/2022    2:39 PM 07/19/2021    3:41 PM 07/13/2020    3:50 PM  6CIT Screen  What Year? 0 points 0 points 0 points 0 points  What month? 0 points 0 points 0 points 0 points  What time? 0 points 0 points 0 points 0 points  Count back from 20 0 points 0 points 0 points 0 points  Months in reverse 0 points 0 points 0 points 0 points  Repeat phrase 0 points 0 points 0 points 0 points  Total Score 0 points 0 points 0 points 0 points    Immunizations Immunization History  Administered Date(s) Administered   Fluad Quad(high Dose 65+) 01/10/2022   Influenza Split 03/08/2013   Influenza, High Dose Seasonal PF 01/14/2019   Influenza,inj,Quad PF,6+ Mos 02/01/2020   Influenza-Unspecified 03/07/2014, 03/10/2018, 01/22/2021   Moderna Covid-19 Vaccine  Bivalent Booster 75yrs & up 02/15/2022   Moderna SARS-COV2 Booster Vaccination 07/20/2021   PFIZER(Purple Top)SARS-COV-2 Vaccination 05/19/2019, 06/07/2019, 02/15/2020, 10/31/2020   PPD Test 03/08/2013, 02/28/2014, 03/13/2015   Pfizer(Comirnaty )Fall Seasonal Vaccine 12 years and older 02/14/2022   Pneumococcal Conjugate-13 03/11/2016   Pneumococcal Polysaccharide-23 03/02/2018    Pneumococcal-Unspecified 04/08/2011   Rsv, Bivalent, Protein Subunit Rsvpref,pf Pattricia Bores) 03/11/2022   Tdap 09/05/2009, 04/11/2020   Zoster Recombinant(Shingrix) 03/18/2018, 05/26/2018   Zoster, Live 02/28/2014    Screening Tests Health Maintenance  Topic Date Due   COVID-19 Vaccine (6 - 2024-25 season) 04/28/2024 (Originally 12/29/2022)   INFLUENZA VACCINE  11/28/2023   Medicare Annual Wellness (AWV)  09/10/2024   MAMMOGRAM  06/30/2025   Colonoscopy  03/07/2026   DTaP/Tdap/Td (3 - Td or Tdap) 04/11/2030   Pneumonia Vaccine 76+ Years old  Completed   DEXA SCAN  Completed   Hepatitis C Screening  Completed   Zoster Vaccines- Shingrix  Completed   HPV VACCINES  Aged Out   Meningococcal B Vaccine  Aged Out    Health Maintenance  There are no preventive care reminders to display for this patient. Health Maintenance Items Addressed:   Additional Screening:  Vision Screening: Recommended annual ophthalmology exams for early detection of glaucoma and other disorders of the eye.  Dental Screening: Recommended annual dental exams for proper oral hygiene  Community Resource Referral / Chronic Care Management: CRR required this visit?  No   CCM required this visit?  No   Plan:    I have personally reviewed and noted the following in the patient's chart:   Medical and social history Use of alcohol, tobacco or illicit drugs  Current medications and supplements including opioid prescriptions. Patient is not currently taking opioid prescriptions. Functional ability and status Nutritional status Physical activity Advanced directives List of other physicians Hospitalizations, surgeries, and ER visits in previous 12 months Vitals Screenings to include cognitive, depression, and falls Referrals and appointments  In addition, I have reviewed and discussed with patient certain preventive protocols, quality metrics, and best practice recommendations. A written personalized care  plan for preventive services as well as general preventive health recommendations were provided to patient.   Dewayne Ford, LPN   1/61/0960   After Visit Summary: (MyChart) Due to this being a telephonic visit, the after visit summary with patients personalized plan was offered to patient via MyChart   Notes: Nothing significant to report at this time.

## 2023-09-11 NOTE — Patient Instructions (Addendum)
 Ms. Koss , Thank you for taking time out of your busy schedule to complete your Annual Wellness Visit with me. I enjoyed our conversation and look forward to speaking with you again next year. I, as well as your care team,  appreciate your ongoing commitment to your health goals. Please review the following plan we discussed and let me know if I can assist you in the future. Your Game plan/ To Do List    Referrals: If you haven't heard from the office you've been referred to, please reach out to them at the phone provided.   Follow up Visits: Next Medicare AWV with our clinical staff: 09/16/24 @ 10:50a   Have you seen your provider in the last 6 months (3 months if uncontrolled diabetes)? Yes Next Office Visit with your provider: 03/04/24  Clinician Recommendations:  Aim for 30 minutes of exercise or brisk walking, 6-8 glasses of water, and 5 servings of fruits and vegetables each day.        This is a list of the screening recommended for you and due dates:  Health Maintenance  Topic Date Due   COVID-19 Vaccine (6 - 2024-25 season) 04/28/2024*   Flu Shot  11/28/2023   Medicare Annual Wellness Visit  09/10/2024   Mammogram  06/30/2025   Colon Cancer Screening  03/07/2026   DTaP/Tdap/Td vaccine (3 - Td or Tdap) 04/11/2030   Pneumonia Vaccine  Completed   DEXA scan (bone density measurement)  Completed   Hepatitis C Screening  Completed   Zoster (Shingles) Vaccine  Completed   HPV Vaccine  Aged Out   Meningitis B Vaccine  Aged Out  *Topic was postponed. The date shown is not the original due date.    Advanced directives: (In Chart) A copy of your advanced directives are scanned into your chart should your provider ever need it. Advance Care Planning is important because it:  [x]  Makes sure you receive the medical care that is consistent with your values, goals, and preferences  [x]  It provides guidance to your family and loved ones and reduces their decisional burden about whether  or not they are making the right decisions based on your wishes.  Follow the link provided in your after visit summary or read over the paperwork we have mailed to you to help you started getting your Advance Directives in place. If you need assistance in completing these, please reach out to us  so that we can help you!  See attachments for Preventive Care and Fall Prevention Tips.

## 2023-09-16 DIAGNOSIS — L570 Actinic keratosis: Secondary | ICD-10-CM | POA: Diagnosis not present

## 2023-09-16 DIAGNOSIS — L814 Other melanin hyperpigmentation: Secondary | ICD-10-CM | POA: Diagnosis not present

## 2023-09-16 DIAGNOSIS — L309 Dermatitis, unspecified: Secondary | ICD-10-CM | POA: Diagnosis not present

## 2023-09-16 DIAGNOSIS — D2362 Other benign neoplasm of skin of left upper limb, including shoulder: Secondary | ICD-10-CM | POA: Diagnosis not present

## 2023-09-18 DIAGNOSIS — R1084 Generalized abdominal pain: Secondary | ICD-10-CM | POA: Diagnosis not present

## 2023-09-18 DIAGNOSIS — K529 Noninfective gastroenteritis and colitis, unspecified: Secondary | ICD-10-CM | POA: Diagnosis not present

## 2023-09-18 DIAGNOSIS — M5441 Lumbago with sciatica, right side: Secondary | ICD-10-CM | POA: Diagnosis not present

## 2023-09-18 DIAGNOSIS — M542 Cervicalgia: Secondary | ICD-10-CM | POA: Diagnosis not present

## 2023-09-24 DIAGNOSIS — M5441 Lumbago with sciatica, right side: Secondary | ICD-10-CM | POA: Diagnosis not present

## 2023-09-24 DIAGNOSIS — M542 Cervicalgia: Secondary | ICD-10-CM | POA: Diagnosis not present

## 2023-09-25 ENCOUNTER — Ambulatory Visit (INDEPENDENT_AMBULATORY_CARE_PROVIDER_SITE_OTHER): Admitting: Obstetrics and Gynecology

## 2023-09-25 VITALS — BP 113/74 | HR 66

## 2023-09-25 DIAGNOSIS — R350 Frequency of micturition: Secondary | ICD-10-CM

## 2023-09-25 DIAGNOSIS — N393 Stress incontinence (female) (male): Secondary | ICD-10-CM

## 2023-09-25 LAB — POCT URINALYSIS DIP (CLINITEK)
Bilirubin, UA: NEGATIVE
Blood, UA: NEGATIVE
Glucose, UA: NEGATIVE mg/dL
Ketones, POC UA: NEGATIVE mg/dL
Leukocytes, UA: NEGATIVE
Nitrite, UA: NEGATIVE
Spec Grav, UA: 1.025 (ref 1.010–1.025)
Urobilinogen, UA: 0.2 U/dL
pH, UA: 6 (ref 5.0–8.0)

## 2023-09-25 MED ORDER — LIDOCAINE HCL URETHRAL/MUCOSAL 2 % EX GEL
1.0000 | Freq: Once | CUTANEOUS | Status: AC
Start: 2023-09-25 — End: 2023-09-25
  Administered 2023-09-25: 1 via URETHRAL

## 2023-09-25 MED ORDER — SULFAMETHOXAZOLE-TRIMETHOPRIM 400-80 MG PO TABS: 1.0000 | ORAL_TABLET | Freq: Once | ORAL | Status: DC

## 2023-09-25 MED ORDER — CIPROFLOXACIN HCL 500 MG PO TABS: 500.0000 mg | ORAL_TABLET | Freq: Once | ORAL | Status: DC

## 2023-09-25 MED ORDER — LIDOCAINE-EPINEPHRINE 1 %-1:100000 IJ SOLN
6.0000 mL | Freq: Once | INTRAMUSCULAR | Status: AC
Start: 2023-09-25 — End: 2023-09-25
  Administered 2023-09-25: 6 mL

## 2023-09-25 MED ORDER — SULFAMETHOXAZOLE-TRIMETHOPRIM 800-160 MG PO TABS: 1.0000 | ORAL_TABLET | Freq: Two times a day (BID) | ORAL | Status: AC

## 2023-09-25 NOTE — Progress Notes (Signed)
 Bulkamid Injection  CC: 73 y.o. y.o. F with stress incontinence who presents for transurethral Bulkamid injection.  Patient signed her consent form.  She started antibiotic prophylaxis today (Bactrim ).  Today's Vitals   09/25/23 1033  BP: 113/74  Pulse: 66    Results for orders placed or performed in visit on 09/25/23 (from the past 24 hours)  POCT URINALYSIS DIP (CLINITEK)     Status: None   Collection Time: 09/25/23 10:23 AM  Result Value Ref Range   Color, UA yellow yellow   Clarity, UA clear clear   Glucose, UA negative negative mg/dL   Bilirubin, UA negative negative   Ketones, POC UA negative negative mg/dL   Spec Grav, UA 6.962 9.528 - 1.025   Blood, UA negative negative   pH, UA 6.0 5.0 - 8.0   POC PROTEIN,UA trace negative, trace   Urobilinogen, UA 0.2 0.2 or 1.0 E.U./dL   Nitrite, UA Negative Negative   Leukocytes, UA Negative Negative    Procedure: Time out was performed. The bladder was catheterized and 10 ml of 2% lidocaine  jelly placed in the urethra. A urethral block was performed by injecting 3ml of 1% lidocaine  with epinephrine  at 3 and 9 o'clock adjacent to the urethra.  The needle was primed.  The cystoscope was inserted to the level of the bladder neck.  The needle was inserted 2 cm and the scope was pulled back into the urethra 2 cm.  The needle was inserted bevel up at the 5 o'clock position and the Bulkamid was injected to obtain coaptation.  This was repeated at the 2 o'clock,  10 o'clock and 7 o'clock positions.   A total of 2- 1ml syringes were used and good circumferential coaptation was noted.  The patient tolerated the procedure well. She was asked to void after the procedure.  Post-Void Residual (PVR) by Bladder Scan: In order to evaluate bladder emptying, we discussed obtaining a postvoid residual and she agreed to this procedure.  Procedure: The ultrasound unit was placed on the patient's abdomen in the suprapubic region after the patient had voided.  A PVR of 105 ml was obtained by bladder scan.     ASSESSMENT: 73 y.o. y.o. s/p transurethral Bulkamid injection for stress incontinence.   PLAN:  - Pt reported she only dribbled a small amount and PVR was so she was catheterized for the remainder then given supplies to self cath if needed. -Patient will follow up in 4 weeks to reassess. Voiding and post-procedure precautions were given. She will return for heavy bleeding, fevers, dysuria lasting beyond today and incomplete emptying.  All questions were answered.  Arma Lamp, MD

## 2023-09-25 NOTE — Patient Instructions (Signed)

## 2023-09-25 NOTE — Addendum Note (Signed)
 Addended by: Zaivion Kundrat N on: 09/25/2023 04:25 PM   Modules accepted: Orders

## 2023-09-29 DIAGNOSIS — Z23 Encounter for immunization: Secondary | ICD-10-CM | POA: Diagnosis not present

## 2023-09-30 DIAGNOSIS — M542 Cervicalgia: Secondary | ICD-10-CM | POA: Diagnosis not present

## 2023-09-30 DIAGNOSIS — M5441 Lumbago with sciatica, right side: Secondary | ICD-10-CM | POA: Diagnosis not present

## 2023-10-01 ENCOUNTER — Other Ambulatory Visit: Payer: Self-pay | Admitting: Internal Medicine

## 2023-10-13 DIAGNOSIS — M9905 Segmental and somatic dysfunction of pelvic region: Secondary | ICD-10-CM | POA: Diagnosis not present

## 2023-10-13 DIAGNOSIS — M9903 Segmental and somatic dysfunction of lumbar region: Secondary | ICD-10-CM | POA: Diagnosis not present

## 2023-10-13 DIAGNOSIS — M5442 Lumbago with sciatica, left side: Secondary | ICD-10-CM | POA: Diagnosis not present

## 2023-10-13 DIAGNOSIS — M9904 Segmental and somatic dysfunction of sacral region: Secondary | ICD-10-CM | POA: Diagnosis not present

## 2023-10-23 ENCOUNTER — Ambulatory Visit: Admitting: Obstetrics and Gynecology

## 2023-11-05 ENCOUNTER — Ambulatory Visit: Admitting: Obstetrics and Gynecology

## 2023-11-06 DIAGNOSIS — M542 Cervicalgia: Secondary | ICD-10-CM | POA: Diagnosis not present

## 2023-11-06 DIAGNOSIS — M5441 Lumbago with sciatica, right side: Secondary | ICD-10-CM | POA: Diagnosis not present

## 2023-11-11 ENCOUNTER — Ambulatory Visit (INDEPENDENT_AMBULATORY_CARE_PROVIDER_SITE_OTHER): Admitting: Obstetrics and Gynecology

## 2023-11-11 ENCOUNTER — Encounter: Payer: Self-pay | Admitting: Obstetrics and Gynecology

## 2023-11-11 VITALS — BP 116/72 | HR 64

## 2023-11-11 DIAGNOSIS — N393 Stress incontinence (female) (male): Secondary | ICD-10-CM

## 2023-11-11 NOTE — Progress Notes (Signed)
 [facility] Return Visit  SUBJECTIVE  History of Present Illness: Tehya H Bruney is a 73 y.o. female seen in follow-up for SUI. She underwent a second urethral bulking on 09/25/23. First urethral bulking was in March 2025. Also s/p Anterior and posterior repair with perineorrhaphy, cystoscopy on 05/13/22. Denies any prolapse symptoms.   She is about 75% better. Has done some long hikes (10-15 miles) and wore a #5 pad which lasted all day. Wears a #3 pad on a daily basis and will last most of the day. Also wears incontinence underwear.   Drinking 1-2 cups of coffee per day. Does not notice really any increased urgency.   Still having some incomplete bowel movements. Using miralax  or colon max daily, magnesium glycinate nightly. Now having bowel movements every day. Stool is soft.   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), Anxiety, Asthma, Basal cell carcinoma of face, Benign neoplasm of skin of lower limb, Bilateral carotid bruits (04/12/2019), Diverticulosis, Gestational diabetes, Hammer toe, acquired (03/15/2009), Heart murmur (03/02/2018), History of COVID-19 (09/14/2020), Hyperglycemia (02/24/2017), Hyperlipidemia, mixed (03/11/2016), Hypothyroidism, Knee pain (09/26/2010), Lead exposure (03/02/2018), Lumbar degenerative disc disease (02/24/2017), Major depressive disorder (03/08/2013), Melanocytic nevi of trunk, Menopause, Neck pain on left side (09/10/2018), Neurofibroma (07/20/2019), Nuclear sclerotic cataract of both eyes (07/23/2012), OAB (overactive bladder) (08/05/2016), Osteopenia, PONV (postoperative nausea and vomiting), Rosacea, Sciatica of left side (02/24/2017), Tendinopathy of left gluteus medius (04/01/2017), Urinary urgency (09/17/2020), Vertigo (04/12/2019), and Vitamin D  deficiency (02/28/2014).   Past Surgical History: She  has a past surgical history that includes  Tonsillectomy and adenoidectomy; Dilation and curettage of uterus; blephoroplasty; Brow lift; Skin cancer excision; Cystoscopy (N/A, 05/13/2022); and Anterior and posterior repair (05/13/2022).   Medications: She has a current medication list which includes the following prescription(s): estradiol , gabapentin , gamma-aminobutyric acid, krill oil, acidophilus lactobacillus, lorazepam , magnesium, nutritional supplements, tirosint , and tretinoin, and the following Facility-Administered Medications: sulfamethoxazole -trimethoprim .   Allergies: Patient is allergic to gluten meal.   Social History: Patient  reports that she quit smoking about 53 years ago. Her smoking use included cigarettes. She has never used smokeless tobacco. She reports current alcohol use of about 3.0 - 4.0 standard drinks of alcohol per week. She reports that she does not use drugs.     OBJECTIVE     Physical Exam: Vitals:   11/11/23 1531  BP: 116/72  Pulse: 64   Gen: No apparent distress, A&O x 3.  Detailed Urogynecologic Evaluation:  Deferred.    Results for orders placed or performed in visit on 09/25/23  POCT URINALYSIS DIP (CLINITEK)   Collection Time: 09/25/23 10:23 AM  Result Value Ref Range   Color, UA yellow yellow   Clarity, UA clear clear   Glucose, UA negative negative mg/dL   Bilirubin, UA negative negative   Ketones, POC UA negative negative mg/dL   Spec Grav, UA 8.974 8.989 - 1.025   Blood, UA negative negative   pH, UA 6.0 5.0 - 8.0   POC PROTEIN,UA trace negative, trace   Urobilinogen, UA 0.2 0.2 or 1.0 E.U./dL   Nitrite, UA Negative Negative   Leukocytes, UA Negative Negative      ASSESSMENT AND PLAN    Ms. Spinola is a 73 y.o. with:  1. SUI (stress urinary incontinence, female)     - Has had good improvement with urethral bulking but still needing pads. We discussed the option of a sling which has better effectiveness,  but she does not want to pursue that at this time. She Shakiara  reconsider in the future.  - Advised to continue with miralax  daily for her constipation. Had previously done pelvic PT but declined PT for constipation but she Kellan pursue this in the future.   Return as needed  Rosaline LOISE Caper, MD  Time spent: I spent 20 minutes dedicated to the care of this patient on the date of this encounter to include pre-visit review of records, face-to-face time with the patient and post visit documentation.

## 2023-11-13 ENCOUNTER — Encounter: Payer: Self-pay | Admitting: Obstetrics and Gynecology

## 2023-11-13 DIAGNOSIS — K5904 Chronic idiopathic constipation: Secondary | ICD-10-CM

## 2023-12-17 DIAGNOSIS — S51812A Laceration without foreign body of left forearm, initial encounter: Secondary | ICD-10-CM | POA: Diagnosis not present

## 2023-12-17 DIAGNOSIS — W010XXA Fall on same level from slipping, tripping and stumbling without subsequent striking against object, initial encounter: Secondary | ICD-10-CM | POA: Diagnosis not present

## 2023-12-17 DIAGNOSIS — W1830XA Fall on same level, unspecified, initial encounter: Secondary | ICD-10-CM | POA: Diagnosis not present

## 2023-12-30 DIAGNOSIS — H02831 Dermatochalasis of right upper eyelid: Secondary | ICD-10-CM | POA: Diagnosis not present

## 2023-12-30 DIAGNOSIS — H02423 Myogenic ptosis of bilateral eyelids: Secondary | ICD-10-CM | POA: Diagnosis not present

## 2023-12-30 DIAGNOSIS — H02834 Dermatochalasis of left upper eyelid: Secondary | ICD-10-CM | POA: Diagnosis not present

## 2023-12-30 DIAGNOSIS — L988 Other specified disorders of the skin and subcutaneous tissue: Secondary | ICD-10-CM | POA: Diagnosis not present

## 2023-12-30 DIAGNOSIS — H57813 Brow ptosis, bilateral: Secondary | ICD-10-CM | POA: Diagnosis not present

## 2024-01-12 DIAGNOSIS — M25511 Pain in right shoulder: Secondary | ICD-10-CM | POA: Diagnosis not present

## 2024-01-12 DIAGNOSIS — M542 Cervicalgia: Secondary | ICD-10-CM | POA: Diagnosis not present

## 2024-01-12 DIAGNOSIS — M48062 Spinal stenosis, lumbar region with neurogenic claudication: Secondary | ICD-10-CM | POA: Diagnosis not present

## 2024-01-12 DIAGNOSIS — M9904 Segmental and somatic dysfunction of sacral region: Secondary | ICD-10-CM | POA: Diagnosis not present

## 2024-01-12 DIAGNOSIS — M9903 Segmental and somatic dysfunction of lumbar region: Secondary | ICD-10-CM | POA: Diagnosis not present

## 2024-01-12 DIAGNOSIS — M9901 Segmental and somatic dysfunction of cervical region: Secondary | ICD-10-CM | POA: Diagnosis not present

## 2024-01-17 DIAGNOSIS — M542 Cervicalgia: Secondary | ICD-10-CM | POA: Diagnosis not present

## 2024-01-17 DIAGNOSIS — M5441 Lumbago with sciatica, right side: Secondary | ICD-10-CM | POA: Diagnosis not present

## 2024-01-29 ENCOUNTER — Ambulatory Visit (INDEPENDENT_AMBULATORY_CARE_PROVIDER_SITE_OTHER): Admitting: Sports Medicine

## 2024-01-29 ENCOUNTER — Other Ambulatory Visit: Payer: Self-pay

## 2024-01-29 ENCOUNTER — Encounter: Payer: Self-pay | Admitting: Family Medicine

## 2024-01-29 VITALS — BP 112/72 | Ht 65.0 in | Wt 157.0 lb

## 2024-01-29 DIAGNOSIS — M79651 Pain in right thigh: Secondary | ICD-10-CM | POA: Diagnosis not present

## 2024-01-29 DIAGNOSIS — S7011XA Contusion of right thigh, initial encounter: Secondary | ICD-10-CM | POA: Diagnosis not present

## 2024-01-29 DIAGNOSIS — Z23 Encounter for immunization: Secondary | ICD-10-CM | POA: Diagnosis not present

## 2024-01-29 MED ORDER — ESTRADIOL 0.1 MG/GM VA CREA: TOPICAL_CREAM | VAGINAL | 1 refills | Status: AC

## 2024-01-29 NOTE — Assessment & Plan Note (Addendum)
 Considering this is not having any active bleeding and no significant pain I think we should continue conservative care Using Ace wrap for compression for couple hours at a time a few times a day Gentle massage to the area and heat as needed  Reassured that no signs of myositis ossificans  Recheck if any complications or if not resolving satisfactorily after 6 to 8 weeks

## 2024-01-29 NOTE — Progress Notes (Signed)
 Chief complaint: Follow-up of large hematoma on right thigh  Patient fell in [facility] a 6 weeks ago.  She had a large swollen hematoma on the lateral aspect of her right thigh.  Acutely this was iced and compressed Since she has returned to [facility] she has been doing some physical therapy and applying some heat and massage to the area and the hematoma is probably less than half of the original size.  She is not experiencing pain.  However she is concerned about whether this will continue to get smaller or whether anything more aggressive should be done.  She also has a small nodule above her left knee that we have noted in the past it has not increased in size but she is curious what this is.  Physical exam Pleasant older female in no acute distress BP 112/72   Ht 5' 5 (1.651 m)   Wt 157 lb (71.2 kg)   LMP 03/29/2004   BMI 26.13 kg/m   She has a palpable hematoma that is not warm or discolored This measures approximately 4 cm long and 5 cm wide on physical exam This is soft and easily movable  In the suprapatellar part of the left knee there is a small nodular area about 1 x 1 cm noted  Knee movement is normal and does not elicit any pain  Ultrasound examination of the right and left thigh  Right thigh reveals a hematoma that sits between the soft tissue and the capsule of the quadriceps tendon at the junction of the middle and distal third of the thigh This is characterized by being a hypoechoic Length is 3.8 cm Width is 4.7 cm Height is 0.5 cm Approximate volume is 89.3 cm There is no abnormal Doppler flow There is some fibrin strands starting to run through the hypoechoic areas  On the left thigh about 1 cm proximal to the superior patella there is a freely mobile but well-circumscribed 1 x 1 cm nodule with the same ultrasound consistency as the surrounding subcutaneous fat  Impression: Resolving hematoma of the right thigh and small lipoma of the left  thigh  Ultrasound and interpretation by Helene NOVAK. Harvey, MD

## 2024-01-30 DIAGNOSIS — Z86018 Personal history of other benign neoplasm: Secondary | ICD-10-CM | POA: Diagnosis not present

## 2024-01-30 DIAGNOSIS — D2371 Other benign neoplasm of skin of right lower limb, including hip: Secondary | ICD-10-CM | POA: Diagnosis not present

## 2024-01-30 DIAGNOSIS — D225 Melanocytic nevi of trunk: Secondary | ICD-10-CM | POA: Diagnosis not present

## 2024-01-30 DIAGNOSIS — L821 Other seborrheic keratosis: Secondary | ICD-10-CM | POA: Diagnosis not present

## 2024-01-30 DIAGNOSIS — D2272 Melanocytic nevi of left lower limb, including hip: Secondary | ICD-10-CM | POA: Diagnosis not present

## 2024-01-30 DIAGNOSIS — Z86008 Personal history of in-situ neoplasm of other site: Secondary | ICD-10-CM | POA: Diagnosis not present

## 2024-01-30 DIAGNOSIS — D2271 Melanocytic nevi of right lower limb, including hip: Secondary | ICD-10-CM | POA: Diagnosis not present

## 2024-01-30 DIAGNOSIS — D2361 Other benign neoplasm of skin of right upper limb, including shoulder: Secondary | ICD-10-CM | POA: Diagnosis not present

## 2024-01-30 DIAGNOSIS — L578 Other skin changes due to chronic exposure to nonionizing radiation: Secondary | ICD-10-CM | POA: Diagnosis not present

## 2024-01-30 DIAGNOSIS — D2261 Melanocytic nevi of right upper limb, including shoulder: Secondary | ICD-10-CM | POA: Diagnosis not present

## 2024-02-04 DIAGNOSIS — M542 Cervicalgia: Secondary | ICD-10-CM | POA: Diagnosis not present

## 2024-02-04 DIAGNOSIS — M5441 Lumbago with sciatica, right side: Secondary | ICD-10-CM | POA: Diagnosis not present

## 2024-02-18 NOTE — Therapy (Unsigned)
 OUTPATIENT PHYSICAL THERAPY FEMALE PELVIC EVALUATION   Patient Name: Shannon [name] MRN: [id number] DOB:26-Aug-1950, 73 y.o., female Today's Date: 02/19/2024  END OF SESSION:  PT End of Session - 02/19/24 1609     Visit Number 1    Date for Recertification  05/13/24    Authorization Type Medicare    Authorization - Visit Number 1    Authorization - Number of Visits 10    PT Start Time 1600    PT Stop Time 1645    PT Time Calculation (min) 45 min    Activity Tolerance Patient tolerated treatment well    Behavior During Therapy [facility] for tasks assessed/performed          Past Medical History:  Diagnosis Date   ADHD (attention deficit hyperactivity disorder), 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 Left torn meniscus Right arthritis   Lead exposure 03/02/2018   Lumbar degenerative disc disease 02/24/2017   MRI 2019 confirms multilevel change  Sciatica at times 2/2 this and periodic radiculopathy   Major depressive disorder 03/08/2013   Melanocytic nevi of trunk    Menopause    Neck pain on left side 09/10/2018   Suspect this is DDD and DJD - XR is pending Neurapraxic spasm to left trapezius   Neurofibroma 07/20/2019   Nuclear sclerotic cataract of both eyes 07/23/2012   OAB (overactive bladder) 08/05/2016   Osteopenia    PONV (postoperative nausea and vomiting)    Rosacea    Sciatica of left side 02/24/2017   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   By exam she has chronic tendinopathy of Glut Med   Urinary urgency 09/17/2020   Vertigo 04/12/2019    Vitamin D  deficiency 02/28/2014   Past Surgical History:  Procedure Laterality Date   ANTERIOR AND POSTERIOR REPAIR  05/13/2022   Procedure: ANTERIOR (CYSTOCELE) AND POSTERIOR REPAIR (RECTOCELE), PERINEORRHAPHY;  Surgeon: Marilynne Rosaline SAILOR, MD;  Location: [facility] [place];  Service: Gynecology;;   blephoroplasty     BROW LIFT     CYSTOSCOPY N/A 05/13/2022   Procedure: CYSTOSCOPY;  Surgeon: Marilynne Rosaline SAILOR, MD;  Location: [facility];  Service: Gynecology;  Laterality: N/A;   DILATION AND CURETTAGE OF UTERUS     SKIN CANCER EXCISION     right lower lip   TONSILLECTOMY AND ADENOIDECTOMY     Patient Active Problem List   Diagnosis Date Noted   Traumatic hematoma of right thigh 01/29/2024   Mild cognitive disorder 09/02/2022   ETD (Eustachian tube dysfunction), bilateral 03/05/2022   Hashimoto's thyroiditis 02/14/2022   Constipation 08/23/2021   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   Basal cell carcinoma of face    Benign neoplasm of skin of lower limb    Melanocytic nevi of trunk    Rosacea    ADHD (attention deficit hyperactivity disorder), 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   Urinary incontinence 09/17/2020   History of COVID-19 09/14/2020   Osteopenia 04/11/2020   Neurofibroma 07/20/2019   Vertigo 04/12/2019   Bilateral carotid bruits  04/12/2019   Neck pain on left side 09/10/2018   Heart murmur 03/02/2018   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   Hyperglycemia 02/24/2017   Lumbar degenerative disc disease 02/24/2017   OAB (overactive bladder) 08/05/2016   Preventative health care 03/25/2016   Hyperlipidemia, mixed 03/11/2016   BMI 29.0-29.9,adult 03/13/2015   Vitamin D  deficiency 02/28/2014   Hypothyroidism 03/08/2013   Major depressive disorder 03/08/2013   Nuclear sclerotic cataract of both eyes 07/23/2012   Knee pain 09/26/2010   Hammer toe, acquired 03/15/2009    PCP: Domenica Harlene LABOR MD  REFERRING PROVIDER: Marilynne Rosaline SAILOR,  MD   REFERRING DIAG: [phone] (ICD-10-CM) - Chronic idiopathic constipation   THERAPY DIAG:  Other lack of coordination - Plan: PT plan of care cert/re-cert  Muscle weakness (generalized) - Plan: PT plan of care cert/re-cert  Rationale for Evaluation and Treatment: Rehabilitation  ONSET DATE: 05/13/22  SUBJECTIVE:                                                                                                                                                                                           SUBJECTIVE STATEMENT: I have trouble to complete a bowel movement. Patient had a rectocele repair. Patient will have stool come out when she urinates. Some urinary leakage. [facility] Patient had bulking with Dr. Marilynne.  Fluid intake: Drinking 1-2 cups of coffee per day   FUNCTIONAL LIMITATIONS: none  PERTINENT HISTORY:  Medications for current condition:  Surgeries: Anterior and posterior repair with perineorrhaphy, cystoscopy on 05/13/22  Other: Basil carcinoma; Diverticulosis; diabetes; Hypothyroidism; Ostepenia   DIAGNOSTIC FINDINGS:  Post-void residual: Voiding Cystourethrogram (VCUG):  Ultrasound: PAIN:  Are you having pain? No   PRECAUTIONS: Other: skin cancer  RED FLAGS: None   WEIGHT BEARING RESTRICTIONS: No  FALLS:  Has patient fallen in last 6 months? Yes. Number of falls 1 time while hiking and not due to balance  OCCUPATION: retired  ACTIVITY LEVEL : not right now due to moving.   PLOF: Independent  PATIENT GOALS: reduce urinary leakage and improve pushing out of stool.    BOWEL MOVEMENT: Pain with bowel movement: No Type of bowel movement:Type (Bristol Stool Scale) Type 4, Frequency daily but not always a big one, Strain sometimes with a little piece comes out, and Splinting yes on the perineal body Fully empty rectum: Nowhen she wipes the stool is still on the toilet paper.  Leakage: No  Pads: No Fiber supplement/laxative miralax   URINATION: Pain with urination: No Fully empty bladder: Yes:                                  Post-void dribble: No Stream: Strong Urgency: No Frequency:during the day every 2-3 hours                                                         Nocturia: No   Leakage: Coughing, Sneezing, Laughing, Exercise, and Lifting Pads/briefs: Yes: 1-4 during day and none at night  INTERCOURSE:not active   PREGNANCY: Vaginal deliveries 2 Tearing Yes:    PROLAPSE: None   OBJECTIVE:  Note: Objective measures were completed at Evaluation unless otherwise noted.  DIAGNOSTIC FINDINGS:  none    COGNITION: Overall cognitive status: Within functional limits for tasks assessed     SENSATION: Light touch: Appears intact    FUNCTIONAL TESTS:  Single leg stance:  Mu:pwrmzjdzi trunk sway  Ou:pwrmzjdzi trunk sway   POSTURE: rounded shoulders, forward head, increased thoracic kyphosis, and posterior pelvic tilt   LUMBARAROM/PROM: lumbar ROM decreased by 25%   LOWER EXTREMITY MNF:apojuzmjo hip ROM is full   LOWER EXTREMITY MMT:  MMT Right eval Left eval  Hip abduction 3+/5 3+/5   (Blank rows = not tested) PALPATION:   Abdominal:   Diastasis: No Distortion: No  Breathing: able to expand the lower rib cage Scar tissue: No                External Perineal Exam: some tenderness on the posterior rectal area                             Internal Pelvic Floor: decreased movement of the anococcygeal ligament, decreased contraction of the right side of the rectum  Patient confirms identification and approves PT to assess internal pelvic floor and treatment Yes No emotional/communication barriers or cognitive limitation. Patient is motivated to learn. Patient understands and agrees with treatment goals and plan. PT explains patient will be examined in standing, sitting, and lying down to see how their muscles and joints  work. When they are ready, they will be asked to remove their underwear so PT can examine their perineum. The patient is also given the option of providing their own chaperone as one is not provided in our facility. The patient also has the right and is explained the right to defer or refuse any part of the evaluation or treatment including the internal exam. With the patient's consent, PT will use one gloved finger to gently assess the muscles of the pelvic floor, seeing how well it contracts and relaxes and if there is muscle symmetry. After, the patient will get dressed and PT and patient will discuss exam findings and plan of care. PT and patient discuss plan of care, schedule, attendance policy and HEP activities.   PELVIC MMT:   MMT eval  Vaginal   Internal Anal Sphincter 3/5 4 sec  External Anal Sphincter 3/5 4 sec  Puborectalis 3/5 4 sec  (Blank rows = not tested)        TONE: Average tone  PROLAPSE: none  TODAY'S TREATMENT:  DATE: 02/19/24  EVAL Examination completed, findings reviewed, pt educated on POC, HEP, and female pelvic floor anatomy, reasoning with pelvic floor assessment internally with pt consent, and abdominal massage. Pt motivated to participate in PT and agreeable to attempt recommendations.     PATIENT EDUCATION:  02/19/24 Education details: Access Code: [email or web address], information for fiber Person educated: Patient Education method: Explanation, Demonstration, Tactile cues, Verbal cues, and Handouts Education comprehension: verbalized understanding, returned demonstration, verbal cues required, tactile cues required, and needs further education  HOME EXERCISE PROGRAM: 02/19/24 Access Code: [email or web address] URL: [email or web address] Date: 02/19/2024 Prepared by: Channing Pereyra  Exercises - Seated Pelvic Floor Contraction  - 3 x daily  - 7 x weekly - 1 sets - 10 reps - 5 sec hold - Seated Quick Flick Pelvic Floor Contractions  - 3 x daily - 7 x weekly - 1 sets - 10 reps  Patient Education - Abdominal Massage for Constipation  ASSESSMENT:  CLINICAL IMPRESSION: Patient is a 73 y.o. female who was seen today for physical therapy evaluation and treatment for constipation. Patient has difficulty with pushing stool out. She will press on the perineal body at times to assist the stool to come out.  She has type 4 stool but sometimes it is small length and other times it will be longer. She has to wipe several times to get the stool off. She will leak urine with Coughing, Sneezing, Laughing, Exercise, and Lifting. She uses 1-4 pads per day. Rectal strength is 3/5. She has decreased contraction on the right side of the rectal muscles. She can contract for 4 seconds then the muscles fatigue. She is not able to forcefully pusht the therapist finger out of the rectum. Patient would benefit from skilled therapy to improve strength and endurance of the pelvic floor muscles.   OBJECTIVE IMPAIRMENTS: decreased activity tolerance, decreased coordination, decreased endurance, decreased ROM, decreased strength, and increased fascial restrictions.   ACTIVITY LIMITATIONS: continence and toileting  PARTICIPATION LIMITATIONS: driving, shopping, and community activity  PERSONAL FACTORS: 1-2 comorbidities: Anterior and posterior repair with perineorrhaphy, cystoscopy on 05/13/22  are also affecting patient's functional outcome.   REHAB POTENTIAL: Excellent  CLINICAL DECISION MAKING: Evolving/moderate complexity  EVALUATION COMPLEXITY: Moderate   GOALS: Goals reviewed with patient? Yes  SHORT TERM GOALS: Target date: 03/18/24  Patient is able to perform diaphragmatic breathing to lengthen the pelvic floor.  Baseline: Goal status: INITIAL  2.  Patient educated on abdominal massage.   Baseline:  Goal status: INITIAL  3.  Patient  educated on how to sit on the commode to have a bowel movement.  Baseline:  Goal status: INITIAL   LONG TERM GOALS: Target date: 05/13/24  Patient is independent with her advanced HEP For pelvic floor strength and edurance.  Baseline:  Goal status: INITIAL  2.  Patient is able to generate enough pressure to push stool out of the rectum fully 4 out of 6 times.  Baseline:  Goal status: INITIAL  3.  Patient is able to lift items without leaking urine with reduction of pressure on the pelvic floor.  Baseline:  Goal status: INITIAL  4.  Patient is able to quickly contract the pelvic floor to reduce the amount of urinary leakage with coughing, sneezing and laughing.  Baseline:  Goal status: INITIAL   PLAN:  PT FREQUENCY: 1-2x/week  PT DURATION: 12 weeks  PLANNED INTERVENTIONS: 97110-Therapeutic exercises, 97530- Therapeutic activity, [id number]- Neuromuscular re-education, 97535- Self Care, [id number]- Manual therapy, Patient/Family education, Joint mobilization, Spinal mobilization, and Biofeedback  PLAN FOR NEXT SESSION: diaphragmatic breathing, toileting, abdominal massage, core strength.    Channing Pereyra, PT 02/19/24 5:03 PM

## 2024-02-19 ENCOUNTER — Encounter: Payer: Self-pay | Admitting: Physical Therapy

## 2024-02-19 ENCOUNTER — Encounter: Attending: Obstetrics and Gynecology | Admitting: Physical Therapy

## 2024-02-19 ENCOUNTER — Ambulatory Visit: Admitting: Internal Medicine

## 2024-02-19 ENCOUNTER — Encounter: Payer: Self-pay | Admitting: Internal Medicine

## 2024-02-19 ENCOUNTER — Other Ambulatory Visit: Payer: Self-pay

## 2024-02-19 VITALS — BP 120/80 | HR 53 | Ht 65.0 in | Wt 159.8 lb

## 2024-02-19 DIAGNOSIS — E063 Autoimmune thyroiditis: Secondary | ICD-10-CM

## 2024-02-19 DIAGNOSIS — E559 Vitamin D deficiency, unspecified: Secondary | ICD-10-CM

## 2024-02-19 DIAGNOSIS — M6281 Muscle weakness (generalized): Secondary | ICD-10-CM | POA: Insufficient documentation

## 2024-02-19 DIAGNOSIS — R278 Other lack of coordination: Secondary | ICD-10-CM | POA: Diagnosis not present

## 2024-02-19 LAB — T4, FREE: Free T4: 1.3 ng/dL (ref 0.8–1.8)

## 2024-02-19 LAB — TSH: TSH: 1.25 m[IU]/L (ref 0.40–4.50)

## 2024-02-19 LAB — VITAMIN D 25 HYDROXY (VIT D DEFICIENCY, FRACTURES): Vit D, 25-Hydroxy: 64 ng/mL (ref 30–100)

## 2024-02-19 NOTE — Progress Notes (Unsigned)
 Name: Shannon [name]  MRN/ DOB: [id number], [birth year]    Age/ Sex: 73 y.o., female    PCP: Domenica Harlene LABOR, MD   Reason for Endocrinology Evaluation: Hypothyroid     Date of Initial Endocrinology Evaluation: 02/12/2021    HPI: [name] is a 73 y.o. female with a past medical history of Hypothyroidism, gluten intolerance,Asthma , ADHD. The patient presented for initial endocrinology clinic visit on 02/19/2024 for consultative assistance with her hypothyroid.   She has been diagnosed with hypothyroidism in 2005.   She was following with an Alternative Endocrinologist in [place]  . She is on Tirosint  100 mcg daily at night AND Desiccated thyroid  7.5 mg daily, has been on this regimen for ~ 10 yrs   She has gluten intolerance but no celiac disease diagnosis  Historically her thyroid  replacement therapy has been adjusted based on her symptoms, she had noted during the summer that she has depression and leg cramps as well as occasional sleeping issues and would decrease thyroid  hormone replacement therapy during the summer season to half a tablet once a week and 1 tablet the rest of the week.  In the fall she tends to go up to taking 1 tablet daily and has noted improvement in her symptoms.  She is on Vitamin D  replacement . Takes 4000 iu daily in the winter and 2000 iu daily in the summer   On her initial visit we stopped compound thyroid  hormone 7.5 mcg and continued Tirosint  100 mcg with a TSH 0.09 uIU/mL    Anti-TPO Abs elevated 565.6 01/2022   No Fh of thyroid  disease   Father with T1DM   SUBJECTIVE:    Today (02/19/24):  [name] is here for a follow up on hypothyroidism   She has been following up with sports medicine for right thigh pain She went for a second urethral bulking in [place], 2025, first urethral bulking was in March, 2025.  She is s/p anterior and posterior repair with  perineorrhaphy , cystoscopy in January, 2024. Continues with incontinence   In the process  of moving  Patient continues with weight loss She is on ADHD meds  No local neck swelling  No palpitations  No tremors  Has chronic constipation , on miralax   Sleep is improving  She has been snoring more , she is tired if she doesn't  sleep well at night    She is on Vitamin D  2000 iu daily (takes 4000 IU depending on the season) Tirosint  88 mcg , 1 tablet daily     HISTORY:  Past Medical History:  Past Medical History:  Diagnosis Date   ADHD (attention deficit hyperactivity disorder), 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 Left torn meniscus Right arthritis   Lead exposure 03/02/2018   Lumbar degenerative disc disease 02/24/2017   MRI 2019 confirms multilevel change  Sciatica at times 2/2 this and periodic radiculopathy   Major depressive disorder 03/08/2013   Melanocytic nevi of trunk    Menopause  Neck pain on left side 09/10/2018   Suspect this is DDD and DJD - XR is pending Neurapraxic spasm to left trapezius   Neurofibroma 07/20/2019   Nuclear sclerotic cataract of both eyes 07/23/2012   OAB (overactive bladder) 08/05/2016   Osteopenia    PONV (postoperative nausea and vomiting)    Rosacea    Sciatica of left side 02/24/2017   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   By exam she has chronic tendinopathy of Glut Med   Urinary urgency 09/17/2020   Vertigo 04/12/2019   Vitamin D  deficiency 02/28/2014   Past Surgical History:  Past Surgical History:  Procedure Laterality Date   ANTERIOR AND POSTERIOR REPAIR  05/13/2022   Procedure: ANTERIOR  (CYSTOCELE) AND POSTERIOR REPAIR (RECTOCELE), PERINEORRHAPHY;  Surgeon: Marilynne Rosaline SAILOR, MD;  Location: [facility] [place];  Service: Gynecology;;   blephoroplasty     BROW LIFT     CYSTOSCOPY N/A 05/13/2022   Procedure: CYSTOSCOPY;  Surgeon: Marilynne Rosaline SAILOR, MD;  Location: [facility];  Service: Gynecology;  Laterality: N/A;   DILATION AND CURETTAGE OF UTERUS     SKIN CANCER EXCISION     right lower lip   TONSILLECTOMY AND ADENOIDECTOMY      Social History:  reports that she quit smoking about 53 years ago. Her smoking use included cigarettes. She has never used smokeless tobacco. She reports current alcohol use of about 3.0 - 4.0 standard drinks of alcohol per week. She reports that she does not use drugs. Family History: family history includes ADD / ADHD in her daughter and son; Alcohol abuse in her sister; Anxiety disorder in her brother, daughter, and son; Cancer in her sister and sister; Cancer - Ovarian in her sister; Colon cancer in her maternal grandfather; Colon polyps in her mother; Depression in her daughter and son; Diabetes in her father; Emphysema in her father; Parkinson's disease in her maternal grandmother and mother; Tuberculosis in her father.   HOME MEDICATIONS: Allergies as of 02/19/2024       Reactions   Gluten Meal Other (See Comments)   GI symptoms; has BM everytime she urinates GI symptoms; has BM everytime she urinates        Medication List        Accurate as of February 19, 2024 10:59 AM. If you have any questions, ask your nurse or doctor.          Acidophilus Lactobacillus 10 BIL UNT/GM Powd Take 1 capsule by mouth daily.   estradiol  0.1 MG/GM vaginal cream Commonly known as: ESTRACE  INSERT ONE APPLICATORFUL VAGINALLY TWICE A WEEK AT BEDTIME   gabapentin  300 MG capsule Commonly known as: NEURONTIN  Take 1 capsule (300 mg total) by mouth 3 (three) times daily as needed.   GAMMA AMINOBUTYRIC ACID PO Take 200  mg by mouth as needed. Up to bid   Krill Oil 1000 MG Caps   LORazepam  2 MG tablet Commonly known as: ATIVAN  Take 1 tablet (2 mg total) by mouth every 8 (eight) hours as needed for anxiety.   MAGNESIUM GLYCINATE PLUS PO Take 100 mg by mouth 2 (two) times daily.   PEPTINEX DT/PREBIOTICS PO Take by mouth.   Tirosint  88 MCG Caps Generic drug: Levothyroxine  Sodium TAKE ONE Capsule BY MOUTH EVERY DAY BEFORE BREAKFAST   tretinoin 0.025 % cream Commonly known as: RETIN-A Apply 0.025 % topically daily. Pea size amount          REVIEW OF SYSTEMS: A comprehensive ROS was  conducted with the patient and is negative except as per HPI     OBJECTIVE:  VS: BP 120/80 (BP Location: Left Arm, Patient Position: Sitting, Cuff Size: Normal)   Pulse (!) 53   Ht 5' 5 (1.651 m)   Wt 159 lb 12.8 oz (72.5 kg)   LMP 03/29/2004   SpO2 99%   BMI 26.59 kg/m    Wt Readings from Last 3 Encounters:  02/19/24 159 lb 12.8 oz (72.5 kg)  01/29/24 157 lb (71.2 kg)  09/11/23 165 lb (74.8 kg)     EXAM: General: Pt appears well and is in NAD  Neck: General: Supple without adenopathy. Thyroid : Thyroid  size normal.  No goiter or nodules appreciated.  Lungs: Clear with good BS bilat   Heart: Auscultation: RRR.  Abdomen: soft, nontender  Extremities:  BL LE: No pretibial edema   Mental Status: Judgment, insight: Intact Orientation: Oriented to time, place, and person Mood and affect: No depression, anxiety, or agitation     DATA REVIEWED:   Latest Reference Range & Units 08/20/23 09:50  Vitamin D , 25-Hydroxy 30 - 100 ng/mL 78    Latest Reference Range & Units 08/20/23 09:50  TSH 0.40 - 4.50 mIU/L 0.75  T4,Free(Direct) 0.8 - 1.8 ng/dL 1.5    89/08/7974 ATI-TPO Ab s 565.6  (reference <100)   ASSESSMENT/PLAN/RECOMMENDATIONS:   Hashimoto's Thyroiditis :  -Patient is clinically euthyroid except for some sleeping difficulty -No local neck symptoms -TSH within normal range, but it is at  the lower end of normal, patient typically reduces Tirosint   to half a tablet once a week during the warmer months, this will be a good time to do this  Medications : Change Tirosint  88 mcg, half a tablet once weekly and continue take 1 tablet the rest of the week    2. Hx vitamin D  deficiency:  -Vitamin D  at the upper limit of normal -She alternates vitamin dose during summer and winter months -Currently  is on 2000 IU daily    Follow-up in 6 months    Signed electronically by: Stefano Redgie Butts, MD  [facility] Endocrinology  [facility] [address] [place] [place], [place] [address] Phone: [phone] FAX: [phone]   CC: Domenica Harlene LABOR, MD [address] [place] [address] Phone: [phone] Fax: [phone]   Return to Endocrinology clinic as below: Future Appointments  Date Time Provider Department Center  02/19/2024  4:00 PM Elnor Channing FALCON, [place] [facility]  02/24/2024  1:00 PM Elnor Channing FALCON, PT [facility] 2 Center [facility]  03/02/2024 11:30 AM Elnor Channing FALCON, PT [facility]  03/04/2024 10:20 AM Domenica Harlene LABOR, MD [facility] [address]  03/09/2024  1:00 PM Elnor Channing FALCON, PT [facility]  03/16/2024  1:00 PM Elnor Channing FALCON, PT [facility]  09/16/2024 10:50 AM [facility] [place] [facility] VISIT 2 [facility] [address]

## 2024-02-20 ENCOUNTER — Ambulatory Visit: Payer: Self-pay | Admitting: Internal Medicine

## 2024-02-24 ENCOUNTER — Encounter: Admitting: Physical Therapy

## 2024-02-24 ENCOUNTER — Encounter: Payer: Self-pay | Admitting: Physical Therapy

## 2024-02-24 DIAGNOSIS — M6281 Muscle weakness (generalized): Secondary | ICD-10-CM | POA: Diagnosis not present

## 2024-02-24 DIAGNOSIS — R278 Other lack of coordination: Secondary | ICD-10-CM | POA: Diagnosis not present

## 2024-02-24 NOTE — Therapy (Signed)
 OUTPATIENT PHYSICAL THERAPY FEMALE PELVIC TREATMENT   Patient Name: Shannon [name] MRN: [id number] DOB:1950-07-22, 73 y.o., female Today's Date: 02/24/2024  END OF SESSION:  PT End of Session - 02/24/24 1309     Visit Number 2    Date for Recertification  05/13/24    Authorization Type Medicare    Authorization - Visit Number 2    Authorization - Number of Visits 10    PT Start Time 1305    PT Stop Time 1355    PT Time Calculation (min) 50 min    Activity Tolerance Patient tolerated treatment well    Behavior During Therapy [facility] for tasks assessed/performed          Past Medical History:  Diagnosis Date   ADHD (attention deficit hyperactivity disorder), 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 Left torn meniscus Right arthritis   Lead exposure 03/02/2018   Lumbar degenerative disc disease 02/24/2017   MRI 2019 confirms multilevel change  Sciatica at times 2/2 this and periodic radiculopathy   Major depressive disorder 03/08/2013   Melanocytic nevi of trunk    Menopause    Neck pain on left side 09/10/2018   Suspect this is DDD and DJD - XR is pending Neurapraxic spasm to left trapezius   Neurofibroma 07/20/2019   Nuclear sclerotic cataract of both eyes 07/23/2012   OAB (overactive bladder) 08/05/2016   Osteopenia    PONV (postoperative nausea and vomiting)    Rosacea    Sciatica of left side 02/24/2017   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   By exam she has chronic tendinopathy of Glut Med   Urinary urgency 09/17/2020   Vertigo 04/12/2019    Vitamin D  deficiency 02/28/2014   Past Surgical History:  Procedure Laterality Date   ANTERIOR AND POSTERIOR REPAIR  05/13/2022   Procedure: ANTERIOR (CYSTOCELE) AND POSTERIOR REPAIR (RECTOCELE), PERINEORRHAPHY;  Surgeon: Marilynne Rosaline SAILOR, MD;  Location: [facility];  Service: Gynecology;;   blephoroplasty     BROW LIFT     CYSTOSCOPY N/A 05/13/2022   Procedure: CYSTOSCOPY;  Surgeon: Marilynne Rosaline SAILOR, MD;  Location: [facility];  Service: Gynecology;  Laterality: N/A;   DILATION AND CURETTAGE OF UTERUS     SKIN CANCER EXCISION     right lower lip   TONSILLECTOMY AND ADENOIDECTOMY     Patient Active Problem List   Diagnosis Date Noted   Traumatic hematoma of right thigh 01/29/2024   Mild cognitive disorder 09/02/2022   ETD (Eustachian tube dysfunction), bilateral 03/05/2022   Hashimoto's thyroiditis 02/14/2022   Constipation 08/23/2021   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   Basal cell carcinoma of face    Benign neoplasm of skin of lower limb    Melanocytic nevi of trunk    Rosacea    ADHD (attention deficit hyperactivity disorder), 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   Urinary incontinence 09/17/2020   History of COVID-19 09/14/2020   Osteopenia 04/11/2020   Neurofibroma 07/20/2019   Vertigo 04/12/2019   Bilateral carotid bruits  04/12/2019   Neck pain on left side 09/10/2018   Heart murmur 03/02/2018   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   Hyperglycemia 02/24/2017   Lumbar degenerative disc disease 02/24/2017   OAB (overactive bladder) 08/05/2016   Preventative health care 03/25/2016   Hyperlipidemia, mixed 03/11/2016   BMI 29.0-29.9,adult 03/13/2015   Vitamin D  deficiency 02/28/2014   Hypothyroidism 03/08/2013   Major depressive disorder 03/08/2013   Nuclear sclerotic cataract of both eyes 07/23/2012   Knee pain 09/26/2010   Hammer toe, acquired 03/15/2009    PCP: Domenica Harlene LABOR MD  REFERRING PROVIDER: Marilynne Rosaline SAILOR,  MD   REFERRING DIAG: [phone] (ICD-10-CM) - Chronic idiopathic constipation   THERAPY DIAG:  Other lack of coordination  Muscle weakness (generalized)  Rationale for Evaluation and Treatment: Rehabilitation  ONSET DATE: 05/13/22  SUBJECTIVE:                                                                                                                                                                                           SUBJECTIVE STATEMENT: I would like to go over the exercises.I am having softer bowel movements and come out better.  When I urinate I will poop too.  Fluid intake: Drinking 1-2 cups of coffee per day   FUNCTIONAL LIMITATIONS: none  PERTINENT HISTORY:  Medications for current condition:  Surgeries: Anterior and posterior repair with perineorrhaphy, cystoscopy on 05/13/22  Other: Basil carcinoma; Diverticulosis; diabetes; Hypothyroidism; Ostepenia   DIAGNOSTIC FINDINGS:  Post-void residual: Voiding Cystourethrogram (VCUG):  Ultrasound: PAIN:  Are you having pain? No   PRECAUTIONS: Other: skin cancer  RED FLAGS: None   WEIGHT BEARING RESTRICTIONS: No  FALLS:  Has patient fallen in last 6 months? Yes. Number of falls 1 time while hiking and not due to balance  OCCUPATION: retired  ACTIVITY LEVEL : not right now due to moving.   PLOF: Independent  PATIENT GOALS: reduce urinary leakage and improve pushing out of stool.    BOWEL MOVEMENT: Pain with bowel movement: No Type of bowel movement:Type (Bristol Stool Scale) Type 4, Frequency daily but not always a big one, Strain sometimes with a little piece comes out, and Splinting yes on the perineal body Fully empty rectum: Nowhen she wipes the stool is still on the toilet paper.  Leakage: No                                                     Pads: No Fiber supplement/laxative  miralax   URINATION: Pain with urination: No Fully empty bladder: Yes:                                  Post-void dribble:  No Stream: Strong Urgency: No Frequency:during the day every 2-3 hours                                                         Nocturia: No   Leakage: Coughing, Sneezing, Laughing, Exercise, and Lifting Pads/briefs: Yes: 1-4 during day and none at night  INTERCOURSE:not active   PREGNANCY: Vaginal deliveries 2 Tearing Yes:    PROLAPSE: None   OBJECTIVE:  Note: Objective measures were completed at Evaluation unless otherwise noted.  DIAGNOSTIC FINDINGS:  none    COGNITION: Overall cognitive status: Within functional limits for tasks assessed     SENSATION: Light touch: Appears intact    FUNCTIONAL TESTS:  Single leg stance:  Mu:pwrmzjdzi trunk sway  Ou:pwrmzjdzi trunk sway   POSTURE: rounded shoulders, forward head, increased thoracic kyphosis, and posterior pelvic tilt   LUMBARAROM/PROM: lumbar ROM decreased by 25%   LOWER EXTREMITY MNF:apojuzmjo hip ROM is full   LOWER EXTREMITY MMT:  MMT Right eval Left eval  Hip abduction 3+/5 3+/5   (Blank rows = not tested) PALPATION:   Abdominal:   Diastasis: No Distortion: No  Breathing: able to expand the lower rib cage Scar tissue: No                External Perineal Exam: some tenderness on the posterior rectal area                             Internal Pelvic Floor: decreased movement of the anococcygeal ligament, decreased contraction of the right side of the rectum  Patient confirms identification and approves PT to assess internal pelvic floor and treatment Yes No emotional/communication barriers or cognitive limitation. Patient is motivated to learn. Patient understands and agrees with treatment goals and plan. PT explains patient will be examined in standing, sitting, and lying down to see how their muscles and joints work. When they are ready, they will be asked to remove their underwear so PT can examine their perineum. The patient is also given the option of providing their own chaperone as one is  not provided in our facility. The patient also has the right and is explained the right to defer or refuse any part of the evaluation or treatment including the internal exam. With the patient's consent, PT will use one gloved finger to gently assess the muscles of the pelvic floor, seeing how well it contracts and relaxes and if there is muscle symmetry. After, the patient will get dressed and PT and patient will discuss exam findings and plan of care. PT and patient discuss plan of care, schedule, attendance policy and HEP activities.   PELVIC MMT:   MMT eval  Vaginal   Internal Anal Sphincter 3/5 4 sec  External Anal Sphincter 3/5 4 sec  Puborectalis 3/5 4 sec  (Blank rows = not tested)        TONE: Average tone  PROLAPSE: none  TODAY'S TREATMENT:       02/24/24 Manual: Soft tissue  mobilization: Circular massage to the abdomen to promote peristalic motion of the intestines and diaphragm to work on diaphragmatic breathing Myofascial release: Tissue rolling of the abdomen and lower rib cage Release along the lateral abdomen where the ureters are Release of the lower abdomen  Neuromuscular re-education: Pelvic floor contraction training: Transverse abdominus contraction with ball squeeze and pelvic floor contraction 20 x Down training: Diaphragmatic breathing with therapist assisting the movement of the rib cage                                                                                                                            DATE: 02/19/24  EVAL Examination completed, findings reviewed, pt educated on POC, HEP, and female pelvic floor anatomy, reasoning with pelvic floor assessment internally with pt consent, and abdominal massage. Pt motivated to participate in PT and agreeable to attempt recommendations.     PATIENT EDUCATION:  02/24/24 Education details: Access Code: [email or web address], information for fiber Person educated: Patient Education method: Explanation,  Demonstration, Tactile cues, Verbal cues, and Handouts Education comprehension: verbalized understanding, returned demonstration, verbal cues required, tactile cues required, and needs further education  HOME EXERCISE PROGRAM: 02/24/24 Access Code: [email or web address] URL: [email or web address] Date: 02/24/2024 Prepared by: Channing Pereyra  Exercises - Seated Pelvic Floor Contraction  - 3 x daily - 7 x weekly - 1 sets - 10 reps - 5 sec hold - Seated Quick Flick Pelvic Floor Contractions  - 3 x daily - 7 x weekly - 1 sets - 10 reps - Supine Diaphragmatic Breathing  - 1 x daily - 7 x weekly - 3 sets - 10 reps - Seated Diaphragmatic Breathing  - 1 x daily - 7 x weekly - 3 sets - 10 reps - Pelvic Floor Contractions in Hooklying with Adduction  - 1 x daily - 7 x weekly - 1 sets - 10 reps  Patient Education - Abdominal Massage for Constipation - High-Fiber Diet to Support Pelvic Health  ASSESSMENT:  CLINICAL IMPRESSION: Patient is a 73 y.o. female who was seen today for physical therapy  treatment for constipation. Patient has difficulty with pushing stool out. Patient was able to perform diaphragmatic breathing and feel the pelvic floor relax.   Patient needed assistance to contract the pelvic floor by using the ball squeeze. She will have stool come out when she urinates. Patient would benefit from skilled therapy to improve strength and endurance of the pelvic floor muscles.   OBJECTIVE IMPAIRMENTS: decreased activity tolerance, decreased coordination, decreased endurance, decreased ROM, decreased strength, and increased fascial restrictions.   ACTIVITY LIMITATIONS: continence and toileting  PARTICIPATION LIMITATIONS: driving, shopping, and community activity  PERSONAL FACTORS: 1-2 comorbidities: Anterior and posterior repair with perineorrhaphy, cystoscopy on 05/13/22  are also affecting patient's functional outcome.   REHAB POTENTIAL: Excellent  CLINICAL DECISION MAKING:  Evolving/moderate complexity  EVALUATION COMPLEXITY: Moderate   GOALS: Goals reviewed with patient? Yes  SHORT TERM GOALS: Target date: 03/18/24  Patient is able to  perform diaphragmatic breathing to lengthen the pelvic floor.  Baseline: Goal status: Met 02/24/24  2.  Patient educated on abdominal massage.  Baseline:  Goal status: Met 02/24/24  3.  Patient educated on how to sit on the commode to have a bowel movement.  Baseline:  Goal status: INITIAL   LONG TERM GOALS: Target date: 05/13/24  Patient is independent with her advanced HEP For pelvic floor strength and edurance.  Baseline:  Goal status: INITIAL  2.  Patient is able to generate enough pressure to push stool out of the rectum fully 4 out of 6 times.  Baseline:  Goal status: INITIAL  3.  Patient is able to lift items without leaking urine with reduction of pressure on the pelvic floor.  Baseline:  Goal status: INITIAL  4.  Patient is able to quickly contract the pelvic floor to reduce the amount of urinary leakage with coughing, sneezing and laughing.  Baseline:  Goal status: INITIAL   PLAN:  PT FREQUENCY: 1-2x/week  PT DURATION: 12 weeks  PLANNED INTERVENTIONS: 97110-Therapeutic exercises, 97530- Therapeutic activity, [id number]- Neuromuscular re-education, 97535- Self Care, [id number]- Manual therapy, Patient/Family education, Joint mobilization, Spinal mobilization, and Biofeedback  PLAN FOR NEXT SESSION:  toileting,  core strength. Isolating pelvic floor contraction   Channing Pereyra, PT 02/24/24 1:55 PM

## 2024-02-25 DIAGNOSIS — M542 Cervicalgia: Secondary | ICD-10-CM | POA: Diagnosis not present

## 2024-02-25 DIAGNOSIS — M5441 Lumbago with sciatica, right side: Secondary | ICD-10-CM | POA: Diagnosis not present

## 2024-02-29 NOTE — Assessment & Plan Note (Signed)
 [id number] acceptable, minimize simple carbs. Increase exercise as tolerated.

## 2024-02-29 NOTE — Assessment & Plan Note (Signed)
Not currently on meds.   °

## 2024-02-29 NOTE — Progress Notes (Signed)
 Subjective:    Patient ID: Shannon [name], female    DOB: 1950-05-24, 73 y.o.   MRN: [id number]  No chief complaint on file.   HPI Discussed the use of AI scribe software for clinical note transcription with the patient, who gave verbal consent to proceed.  History of Present Illness Shannon [name] is a 73 year old female who presents for medication management and follow-up on her current health issues.  She is currently taking Vyvanse  20 mg, which has been more effective than the previous 10 mg dose in improving focus and organization. She has a follow-up appointment in January.  She has a prescription for estradiol  but lost half of the applicator and applies it manually. She does not use insurance for this medication.  She experiences constipation and uses Miralax  daily but struggles to find the right balance. She is gluten intolerant and cannot use Benafiber. She is considering other options like magnesium citrate and oxide but is cautious about magnesium intake due to potential cardiac risks. She has been attending pelvic physical therapy to address bowel issues.  She has a history of stress incontinence and uses incontinence pads and underwear. There has been some improvement after bulking procedures, but she still experiences leakage, especially during physical activities like walking.  She has experienced several falls in the past year, attributed to fatigue and inattentiveness. She believes standing up straight helps prevent falls. She had a significant fall in [facility] last summer but did not sustain any fractures.  She is moving to [place] next week to be closer to her family, which includes her children and grandchildren. She plans to continue her care virtually and in-person as needed, given the proximity. No burning with urination but reports increased frequency, which has since settled down. She reports feeling tired and having no brain due to the stress of  moving.    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 Left torn meniscus Right arthritis   Lead exposure 03/02/2018   Lumbar degenerative disc disease 02/24/2017   MRI 2019 confirms multilevel change  Sciatica at times 2/2 this and periodic radiculopathy   Major depressive disorder 03/08/2013   Melanocytic nevi of trunk    Menopause    Neck pain on left side 09/10/2018   Suspect this is DDD and DJD - XR is pending Neurapraxic spasm to left trapezius   Neurofibroma 07/20/2019   Nuclear sclerotic cataract of both eyes 07/23/2012   OAB (overactive bladder) 08/05/2016   Osteopenia    PONV (postoperative nausea and vomiting)    Rosacea    Sciatica of left side 02/24/2017   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   By exam she has chronic tendinopathy of Glut Med   Urinary urgency 09/17/2020   Vertigo 04/12/2019   Vitamin D  deficiency 02/28/2014    Past Surgical History:  Procedure Laterality Date   ANTERIOR AND POSTERIOR REPAIR  05/13/2022  Procedure: ANTERIOR (CYSTOCELE) AND POSTERIOR REPAIR (RECTOCELE), PERINEORRHAPHY;  Surgeon: Marilynne Rosaline SAILOR, MD;  Location: [facility];  Service: Gynecology;;   blephoroplasty     BROW LIFT     CYSTOSCOPY N/A 05/13/2022   Procedure: CYSTOSCOPY;  Surgeon: Marilynne Rosaline SAILOR, MD;  Location: [facility];  Service: Gynecology;  Laterality: N/A;   DILATION AND CURETTAGE OF UTERUS     SKIN CANCER EXCISION      right lower lip   TONSILLECTOMY AND ADENOIDECTOMY      Family History  Problem Relation Age of Onset   Colon polyps Mother    Parkinson's disease Mother        developed Parkinson's disease dementia   Diabetes Father    Emphysema Father    Tuberculosis Father    Alcohol abuse Sister    Cancer Sister        skin cancer, melanoma cell, basil cell   Cancer Sister        melanoma   Cancer - Ovarian Sister    Anxiety disorder Brother    Parkinson's disease Maternal Grandmother        developed Parkinson's disease dementia   Colon cancer Maternal Grandfather    ADD / ADHD Daughter    Anxiety disorder Daughter    Depression Daughter    ADD / ADHD Son    Anxiety disorder Son    Depression Son    Bladder Cancer Neg Hx    Uterine cancer Neg Hx     Social History   Socioeconomic History   Marital status: Single    Spouse name: Not on file   Number of children: 2   Years of education: 18   Highest education level: Master's degree (e.g., MA, MS, MEng, MEd, MSW, MBA)  Occupational History   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   Drug use: No   Sexual activity: Not Currently  Other Topics Concern   Not on file  Social History Narrative   Retired from [employer].   No major dietary restrictions except avoid gluten.       Social Drivers of Corporate Investment Banker Strain: Low Risk  (09/11/2023)   Overall Financial Resource Strain (CARDIA)    Difficulty of Paying Living Expenses: Not hard at all  Food Insecurity: No Food  Insecurity (09/11/2023)   Hunger Vital Sign    Worried About Running Out of Food in the Last Year: Never true    Ran Out of Food in the Last Year: Never true  Transportation Needs: No Transportation Needs (09/11/2023)   PRAPARE - Administrator, Civil Service (Medical): No    Lack of Transportation (Non-Medical): No  Physical Activity: Insufficiently Active (09/11/2023)   Exercise Vital Sign    Days of Exercise per Week: 3 days    Minutes of Exercise per Session: 40 min  Stress: No Stress Concern Present (09/11/2023)  [employer] of Occupational Health - Occupational Stress Questionnaire    Feeling of Stress : Not at all  Recent Concern: Stress - Stress Concern Present (08/27/2023)   [employer] of Occupational Health - Occupational Stress Questionnaire    Feeling of Stress : To some extent  Social Connections: Moderately Integrated (09/11/2023)   Social Connection and Isolation Panel    Frequency of Communication with Friends and Family: More than three times a week    Frequency of Social Gatherings with Friends and Family: More than three times a week    Attends Religious Services: More than 4 times per year    Active Member of [employer] or Organizations: Yes    Attends Engineer, Structural: More than 4 times per year    Marital Status: Divorced  Intimate Partner Violence: Not At Risk (09/11/2023)   Humiliation, Afraid, Rape, and Kick questionnaire    Fear of Current or Ex-Partner: No    Emotionally Abused: No    Physically Abused: No    Sexually Abused: No    Outpatient Medications Prior to Visit  Medication Sig Dispense Refill   estradiol  (ESTRACE ) 0.1 MG/GM vaginal cream INSERT ONE APPLICATORFUL VAGINALLY TWICE A WEEK AT BEDTIME 42.5 g 1   gabapentin  (NEURONTIN ) 300 MG capsule Take 1 capsule (300 mg total) by mouth 3 (three) times daily as needed.     GAMMA AMINOBUTYRIC ACID PO Take 200 mg by mouth as needed. Up to bid     Krill Oil 1000 MG CAPS       Lactobacillus Acidophilus (ACIDOPHILUS LACTOBACILLUS) 10 BIL UNT/GM POWD Take 1 capsule by mouth daily.     LORazepam  (ATIVAN ) 2 MG tablet Take 1 tablet (2 mg total) by mouth every 8 (eight) hours as needed for anxiety. 40 tablet 1   MAGNESIUM GLYCINATE PLUS PO Take 100 mg by mouth 2 (two) times daily.     Nutritional Supplements (PEPTINEX DT/PREBIOTICS PO) Take by mouth.     TIROSINT  88 MCG CAPS TAKE ONE Capsule BY MOUTH EVERY DAY BEFORE BREAKFAST 90 capsule 1   tretinoin (RETIN-A) 0.025 % cream Apply 0.025 % topically daily. Pea size amount     Facility-Administered Medications Prior to Visit  Medication Dose Route Frequency Provider Last Rate Last Admin   sulfamethoxazole -trimethoprim  (BACTRIM  DS) 800-160 MG per tablet 1 tablet  1 tablet Oral Q12H Marilynne Rosaline SAILOR, MD        Allergies  Allergen Reactions   Gluten Meal Other (See Comments)    GI symptoms; has BM everytime she urinates GI symptoms; has BM everytime she urinates    Review of Systems  Constitutional:  Positive for malaise/fatigue. Negative for fever.  HENT:  Negative for congestion.   Eyes:  Negative for blurred vision.  Respiratory:  Negative for shortness of breath.   Cardiovascular:  Negative for chest pain, palpitations and leg swelling.  Gastrointestinal:  Positive for constipation. Negative for abdominal pain, blood in stool and nausea.  Genitourinary:  Positive for frequency and urgency. Negative for dysuria and hematuria.  Musculoskeletal:  Positive for falls.  Skin:  Negative for rash.  Neurological:  Negative for dizziness, loss of consciousness and headaches.  Endo/Heme/Allergies:  Negative for environmental allergies.  Psychiatric/Behavioral:  Negative for depression. The patient is not nervous/anxious.        Objective:    Physical Exam Constitutional:      General: She is not in acute distress.    Appearance: Normal appearance. She is well-developed. She is  not toxic-appearing.  HENT:      Head: Normocephalic and atraumatic.     Right Ear: External ear normal.     Left Ear: External ear normal.     Nose: Nose normal.  Eyes:     General:        Right eye: No discharge.        Left eye: No discharge.     Conjunctiva/sclera: Conjunctivae normal.  Neck:     Thyroid : No thyromegaly.  Cardiovascular:     Rate and Rhythm: Normal rate and regular rhythm.     Heart sounds: Normal heart sounds. No murmur heard. Pulmonary:     Effort: Pulmonary effort is normal. No respiratory distress.     Breath sounds: Normal breath sounds.  Abdominal:     General: Bowel sounds are normal.     Palpations: Abdomen is soft.     Tenderness: There is no abdominal tenderness. There is no guarding.  Musculoskeletal:        General: Normal range of motion.     Cervical back: Neck supple.  Lymphadenopathy:     Cervical: No cervical adenopathy.  Skin:    General: Skin is warm and dry.  Neurological:     Mental Status: She is alert and oriented to person, place, and time.  Psychiatric:        Mood and Affect: Mood normal.        Behavior: Behavior normal.        Thought Content: Thought content normal.        Judgment: Judgment normal.    LMP 03/29/2004  Wt Readings from Last 3 Encounters:  02/19/24 159 lb 12.8 oz (72.5 kg)  01/29/24 157 lb (71.2 kg)  09/11/23 165 lb (74.8 kg)    Diabetic Foot Exam - Simple   No data filed    Lab Results  Component Value Date   WBC 4.5 08/28/2023   HGB 13.3 08/28/2023   HCT 39.6 08/28/2023   PLT 273.0 08/28/2023   GLUCOSE 87 08/28/2023   CHOL 204 (H) 08/28/2023   TRIG 58.0 08/28/2023   HDL 58.20 08/28/2023   LDLCALC 135 (H) 08/28/2023   ALT 14 08/28/2023   AST 21 08/28/2023   NA 140 08/28/2023   K 5.0 08/28/2023   CL 104 08/28/2023   CREATININE 0.94 08/28/2023   BUN 18 08/28/2023   CO2 28 08/28/2023   TSH 1.25 02/19/2024   HGBA1C 5.5 08/28/2023   MICROALBUR 0.2 02/28/2014    Lab Results  Component Value Date   TSH 1.25 02/19/2024    Lab Results  Component Value Date   WBC 4.5 08/28/2023   HGB 13.3 08/28/2023   HCT 39.6 08/28/2023   MCV 102.8 (H) 08/28/2023   PLT 273.0 08/28/2023   Lab Results  Component Value Date   NA 140 08/28/2023   K 5.0 08/28/2023   CO2 28 08/28/2023   GLUCOSE 87 08/28/2023   BUN 18 08/28/2023   CREATININE 0.94 08/28/2023   BILITOT 1.0 08/28/2023   ALKPHOS 70 08/28/2023   AST 21 08/28/2023   ALT 14 08/28/2023   PROT 6.6 08/28/2023   ALBUMIN 4.3 08/28/2023   CALCIUM 9.6 08/28/2023   GFR 60.43 08/28/2023   Lab Results  Component Value Date   CHOL 204 (H) 08/28/2023   Lab Results  Component Value Date   HDL 58.20 08/28/2023   Lab Results  Component Value Date   LDLCALC 135 (H) 08/28/2023   Lab Results  Component Value Date   TRIG 58.0 08/28/2023   Lab Results  Component Value Date   CHOLHDL 4 08/28/2023   Lab Results  Component Value Date   HGBA1C 5.5 08/28/2023       Assessment & Plan:  ADHD (attention deficit hyperactivity disorder), inattentive type Assessment & Plan: Not currently on meds   Hyperglycemia Assessment & Plan: hgba1c acceptable, minimize simple carbs. Increase exercise as tolerated.    Hyperlipidemia, mixed Assessment & Plan: Encourage heart healthy diet such as MIND or DASH diet, increase exercise, avoid trans fats, simple carbohydrates and processed foods, consider a krill or fish or flaxseed oil cap daily.     Hypothyroidism, unspecified type Assessment & Plan: On Levothyroxine , continue to monitor    Vitamin D  deficiency Assessment & Plan: Supplement and monitor      Assessment and Plan Assessment & Plan ADHD, predominantly inattentive type Currently on Lisdexamfetamine (Vyvanse ) 20 mg daily, which is more effective than the previous 10 mg dose. Considering increasing to 30 mg to assess further improvement in focus and organization. - Will consider increasing to 30 mg after consultation with Dr. Elouise in  January.  Urinary incontinence Experiencing stress incontinence with some improvement after bulking procedure. Currently using incontinence underwear with pads, but still experiencing leakage during activities like walking three miles. Discussed potential benefits and risks of surgical intervention, noting it is a major surgery with variable success rates. - Will consider surgical intervention for urinary incontinence if symptoms persist and impact quality of life.  Constipation Chronic constipation managed with Miralax . Gluten intolerance limits fiber supplement options. Discussed alternative fiber supplements like psyllium or Citrucel. Magnesium citrate considered but noted as potentially harsh. Discussed potential use of magnesium oxide, but advised caution due to risk of hypermagnesemia, especially with concurrent magnesium glycinate use. - Continue Miralax  as needed. - Consider psyllium or Citrucel as fiber supplements. - Consult GI if considering magnesium citrate or oxide for constipation management.  General Health Maintenance Discussed potential upgrade to Prevnar 20 vaccine for improved pneumococcal coverage. No history of pneumonia. Discussed importance of vaccinations in preventing hospitalizations and respiratory failure in older adults. - Consider Prevnar 20 vaccine for improved pneumococcal coverage.  Recording duration: 26 minutes     Harlene Horton, MD

## 2024-02-29 NOTE — Assessment & Plan Note (Signed)
 Encourage heart healthy diet such as MIND or DASH diet, increase exercise, avoid trans fats, simple carbohydrates and processed foods, consider a krill or fish or flaxseed oil cap daily.

## 2024-02-29 NOTE — Assessment & Plan Note (Signed)
 On Levothyroxine, continue to monitor

## 2024-02-29 NOTE — Assessment & Plan Note (Signed)
 Supplement and monitor

## 2024-03-01 ENCOUNTER — Other Ambulatory Visit: Payer: Self-pay

## 2024-03-01 ENCOUNTER — Encounter: Payer: Self-pay | Admitting: Family Medicine

## 2024-03-01 ENCOUNTER — Other Ambulatory Visit: Payer: Self-pay | Admitting: Family Medicine

## 2024-03-01 MED ORDER — ESCITALOPRAM OXALATE 10 MG PO TABS: 15.0000 mg | ORAL_TABLET | Freq: Every day | ORAL | 1 refills | Status: DC

## 2024-03-02 ENCOUNTER — Encounter: Payer: Self-pay | Admitting: Physical Therapy

## 2024-03-02 ENCOUNTER — Encounter: Attending: Obstetrics and Gynecology | Admitting: Physical Therapy

## 2024-03-02 DIAGNOSIS — R278 Other lack of coordination: Secondary | ICD-10-CM | POA: Diagnosis not present

## 2024-03-02 DIAGNOSIS — M6281 Muscle weakness (generalized): Secondary | ICD-10-CM | POA: Diagnosis not present

## 2024-03-02 NOTE — Patient Instructions (Signed)

## 2024-03-02 NOTE — Therapy (Signed)
 OUTPATIENT PHYSICAL THERAPY FEMALE PELVIC TREATMENT   Patient Name: Shannon [name] MRN: [id number] DOB:08-Nov-1950, 73 y.o., female Today's Date: 03/02/2024  END OF SESSION:  PT End of Session - 03/02/24 1134     Visit Number 3    Date for Recertification  05/13/24    Authorization Type Medicare    Authorization - Visit Number 3    Authorization - Number of Visits 10    PT Start Time 1130    PT Stop Time 1215    PT Time Calculation (min) 45 min    Activity Tolerance Patient tolerated treatment well    Behavior During Therapy [facility] for tasks assessed/performed          Past Medical History:  Diagnosis Date   ADHD (attention deficit hyperactivity disorder), 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 Left torn meniscus Right arthritis   Lead exposure 03/02/2018   Lumbar degenerative disc disease 02/24/2017   MRI 2019 confirms multilevel change  Sciatica at times 2/2 this and periodic radiculopathy   Major depressive disorder 03/08/2013   Melanocytic nevi of trunk    Menopause    Neck pain on left side 09/10/2018   Suspect this is DDD and DJD - XR is pending Neurapraxic spasm to left trapezius   Neurofibroma 07/20/2019   Nuclear sclerotic cataract of both eyes 07/23/2012   OAB (overactive bladder) 08/05/2016   Osteopenia    PONV (postoperative nausea and vomiting)    Rosacea    Sciatica of left side 02/24/2017   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   By exam she has chronic tendinopathy of Glut Med   Urinary urgency 09/17/2020   Vertigo 04/12/2019   Vitamin  D deficiency 02/28/2014   Past Surgical History:  Procedure Laterality Date   ANTERIOR AND POSTERIOR REPAIR  05/13/2022   Procedure: ANTERIOR (CYSTOCELE) AND POSTERIOR REPAIR (RECTOCELE), PERINEORRHAPHY;  Surgeon: Marilynne Rosaline SAILOR, MD;  Location: [facility];  Service: Gynecology;;   blephoroplasty     BROW LIFT     CYSTOSCOPY N/A 05/13/2022   Procedure: CYSTOSCOPY;  Surgeon: Marilynne Rosaline SAILOR, MD;  Location: [facility];  Service: Gynecology;  Laterality: N/A;   DILATION AND CURETTAGE OF UTERUS     SKIN CANCER EXCISION     right lower lip   TONSILLECTOMY AND ADENOIDECTOMY     Patient Active Problem List   Diagnosis Date Noted   Traumatic hematoma of right thigh 01/29/2024   Mild cognitive disorder 09/02/2022   ETD (Eustachian tube dysfunction), bilateral 03/05/2022   Hashimoto's thyroiditis 02/14/2022   Constipation 08/23/2021   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   Basal cell carcinoma of face    Benign neoplasm of skin of lower limb    Melanocytic nevi of trunk    Rosacea    ADHD (attention deficit hyperactivity disorder), 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   Urinary incontinence 09/17/2020   History of COVID-19 09/14/2020   Osteopenia 04/11/2020   Neurofibroma 07/20/2019   Vertigo 04/12/2019   Bilateral carotid bruits  04/12/2019   Neck pain on left side 09/10/2018   Heart murmur 03/02/2018   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   Hyperglycemia 02/24/2017   Lumbar degenerative disc disease 02/24/2017   OAB (overactive bladder) 08/05/2016   Preventative health care 03/25/2016   Hyperlipidemia, mixed 03/11/2016   BMI 29.0-29.9,adult 03/13/2015   Vitamin D  deficiency 02/28/2014   Hypothyroidism 03/08/2013   Major depressive disorder 03/08/2013   Nuclear sclerotic cataract of both eyes 07/23/2012   Knee pain 09/26/2010   Hammer toe, acquired 03/15/2009    PCP: Domenica Harlene LABOR MD  REFERRING PROVIDER: Marilynne Rosaline SAILOR, MD    REFERRING DIAG: [phone] (ICD-10-CM) - Chronic idiopathic constipation   THERAPY DIAG:  Other lack of coordination  Muscle weakness (generalized)  Rationale for Evaluation and Treatment: Rehabilitation  ONSET DATE: 05/13/22  SUBJECTIVE:                                                                                                                                                                                           SUBJECTIVE STATEMENT: I would like to go over the exercises.I am having softer bowel movements and come out better.  When I urinate I will poop too.  Fluid intake: Drinking 1-2 cups of coffee per day   FUNCTIONAL LIMITATIONS: none  PERTINENT HISTORY:  Medications for current condition:  Surgeries: Anterior and posterior repair with perineorrhaphy, cystoscopy on 05/13/22  Other: Basil carcinoma; Diverticulosis; diabetes; Hypothyroidism; Ostepenia   DIAGNOSTIC FINDINGS:  Post-void residual: Voiding Cystourethrogram (VCUG):  Ultrasound: PAIN:  Are you having pain? No   PRECAUTIONS: Other: skin cancer  RED FLAGS: None   WEIGHT BEARING RESTRICTIONS: No  FALLS:  Has patient fallen in last 6 months? Yes. Number of falls 1 time while hiking and not due to balance  OCCUPATION: retired  ACTIVITY LEVEL : not right now due to moving.   PLOF: Independent  PATIENT GOALS: reduce urinary leakage and improve pushing out of stool.    BOWEL MOVEMENT: Pain with bowel movement: No Type of bowel movement:Type (Bristol Stool Scale) Type 4, Frequency daily but not always a big one, Strain sometimes with a little piece comes out, and Splinting yes on the perineal body Fully empty rectum: Nowhen she wipes the stool is still on the toilet paper.  Leakage: No                                                     Pads: No Fiber supplement/laxative  miralax   URINATION: Pain with urination: No Fully empty bladder: Yes:                                  Post-void dribble:  No Stream: Strong Urgency: No Frequency:during the day every 2-3 hours                                                         Nocturia: No   Leakage: Coughing, Sneezing, Laughing, Exercise, and Lifting Pads/briefs: Yes: 1-4 during day and none at night  INTERCOURSE:not active   PREGNANCY: Vaginal deliveries 2 Tearing Yes:    PROLAPSE: None   OBJECTIVE:  Note: Objective measures were completed at Evaluation unless otherwise noted.  DIAGNOSTIC FINDINGS:  none    COGNITION: Overall cognitive status: Within functional limits for tasks assessed     SENSATION: Light touch: Appears intact    FUNCTIONAL TESTS:  Single leg stance:  Mu:pwrmzjdzi trunk sway  Ou:pwrmzjdzi trunk sway   POSTURE: rounded shoulders, forward head, increased thoracic kyphosis, and posterior pelvic tilt   LUMBARAROM/PROM: lumbar ROM decreased by 25%   LOWER EXTREMITY MNF:apojuzmjo hip ROM is full   LOWER EXTREMITY MMT:  MMT Right eval Left eval  Hip abduction 3+/5 3+/5   (Blank rows = not tested) PALPATION:   Abdominal:   Diastasis: No Distortion: No  Breathing: able to expand the lower rib cage Scar tissue: No                External Perineal Exam: some tenderness on the posterior rectal area                             Internal Pelvic Floor: decreased movement of the anococcygeal ligament, decreased contraction of the right side of the rectum  Patient confirms identification and approves PT to assess internal pelvic floor and treatment Yes No emotional/communication barriers or cognitive limitation. Patient is motivated to learn. Patient understands and agrees with treatment goals and plan. PT explains patient will be examined in standing, sitting, and lying down to see how their muscles and joints work. When they are ready, they will be asked to remove their underwear so PT can examine their perineum. The patient is also given the option of providing their own chaperone as one is  not provided in our facility. The patient also has the right and is explained the right to defer or refuse any part of the evaluation or treatment including the internal exam. With the patient's consent, PT will use one gloved finger to gently assess the muscles of the pelvic floor, seeing how well it contracts and relaxes and if there is muscle symmetry. After, the patient will get dressed and PT and patient will discuss exam findings and plan of care. PT and patient discuss plan of care, schedule, attendance policy and HEP activities.   PELVIC MMT:   MMT eval  Vaginal   Internal Anal Sphincter 3/5 4 sec  External Anal Sphincter 3/5 4 sec  Puborectalis 3/5 4 sec  (Blank rows = not tested)        TONE: Average tone  PROLAPSE: none  TODAY'S TREATMENT:       03/02/24 Neuromuscular re-education: Form  correction: Bridge with ball between knees, press yoga block between hands, toes up Supine march with holding the yoga block between hands Supine with ball squeeze moving arms up and down squeezing the yoga block between hands Therapeutic activities: Functional strengthening activities: Educated patient on having a bowel movement with her knees above hips, diaphragmatic breathing, breathing out to push stool out and contract the pelvic floor afterwards.     02/24/24 Manual: Soft tissue mobilization: Circular massage to the abdomen to promote peristalic motion of the intestines and diaphragm to work on diaphragmatic breathing Myofascial release: Tissue rolling of the abdomen and lower rib cage Release along the lateral abdomen where the ureters are Release of the lower abdomen  Neuromuscular re-education: Pelvic floor contraction training: Transverse abdominus contraction with ball squeeze and pelvic floor contraction 20 x Down training: Diaphragmatic breathing with therapist assisting the movement of the rib cage                                                                                                                             DATE: 02/19/24  EVAL Examination completed, findings reviewed, pt educated on POC, HEP, and female pelvic floor anatomy, reasoning with pelvic floor assessment internally with pt consent, and abdominal massage. Pt motivated to participate in PT and agreeable to attempt recommendations.     PATIENT EDUCATION:  03/02/24 Education details: Access Code: [email or web address],; education on how to have a bowel movement Person educated: Patient Education method: Explanation, Demonstration, Tactile cues, Verbal cues, and Handouts Education comprehension: verbalized understanding, returned demonstration, verbal cues required, tactile cues required, and needs further education  HOME EXERCISE PROGRAM: 03/02/24 Access Code: [email or web address] URL: [email or web address] Date: 03/02/2024 Prepared by: Channing Pereyra  Exercises - Seated Pelvic Floor Contraction  - 3 x daily - 7 x weekly - 1 sets - 10 reps - 5 sec hold - Seated Quick Flick Pelvic Floor Contractions  - 3 x daily - 7 x weekly - 1 sets - 10 reps - Supine Diaphragmatic Breathing  - 1 x daily - 7 x weekly - 3 sets - 10 reps - Seated Diaphragmatic Breathing  - 1 x daily - 7 x weekly - 3 sets - 10 reps - Pelvic Floor Contractions in Hooklying with Adduction  - 1 x daily - 3 x weekly - 2 sets - 10 reps - Supine Bridge with Mini Swiss Ball Between Knees  - 1 x daily - 3 x weekly - 2 sets - 10 reps - Supine March  - 1 x daily - 3 x weekly - 2 sets - 10 reps  Patient Education - Abdominal Massage for Constipation - High-Fiber Diet to Support Pelvic Health  ASSESSMENT:  CLINICAL IMPRESSION: Patient is a 73 y.o. female who was seen today for physical therapy  treatment for constipation. Patient has learned how to have a bowel movement with breathing to assist the stool come out. She understands to contract the anus after a bowel movement to  reduce stool from coming out. She is learning how to strengthen  her core to make it easier to push stool out. She will have stool come out when she urinates. Patient would benefit from skilled therapy to improve strength and endurance of the pelvic floor muscles.   OBJECTIVE IMPAIRMENTS: decreased activity tolerance, decreased coordination, decreased endurance, decreased ROM, decreased strength, and increased fascial restrictions.   ACTIVITY LIMITATIONS: continence and toileting  PARTICIPATION LIMITATIONS: driving, shopping, and community activity  PERSONAL FACTORS: 1-2 comorbidities: Anterior and posterior repair with perineorrhaphy, cystoscopy on 05/13/22  are also affecting patient's functional outcome.   REHAB POTENTIAL: Excellent  CLINICAL DECISION MAKING: Evolving/moderate complexity  EVALUATION COMPLEXITY: Moderate   GOALS: Goals reviewed with patient? Yes  SHORT TERM GOALS: Target date: 03/18/24  Patient is able to perform diaphragmatic breathing to lengthen the pelvic floor.  Baseline: Goal status: Met 02/24/24  2.  Patient educated on abdominal massage.  Baseline:  Goal status: Met 02/24/24  3.  Patient educated on how to sit on the commode to have a bowel movement.  Baseline:  Goal status: Met 03/02/24   LONG TERM GOALS: Target date: 05/13/24  Patient is independent with her advanced HEP For pelvic floor strength and edurance.  Baseline:  Goal status: INITIAL  2.  Patient is able to generate enough pressure to push stool out of the rectum fully 4 out of 6 times.  Baseline:  Goal status: INITIAL  3.  Patient is able to lift items without leaking urine with reduction of pressure on the pelvic floor.  Baseline:  Goal status: INITIAL  4.  Patient is able to quickly contract the pelvic floor to reduce the amount of urinary leakage with coughing, sneezing and laughing.  Baseline:  Goal status: INITIAL   PLAN:  PT FREQUENCY: 1-2x/week  PT DURATION: 12 weeks  PLANNED INTERVENTIONS: 97110-Therapeutic exercises, 97530-  Therapeutic activity, [id number]- Neuromuscular re-education, 97535- Self Care, [id number]- Manual therapy, Patient/Family education, Joint mobilization, Spinal mobilization, and Biofeedback  PLAN FOR NEXT SESSION: core strength. Isolating pelvic floor contraction, lifting with pelvic floor engaged   Channing Pereyra, PT 03/02/24 1:01 PM

## 2024-03-04 ENCOUNTER — Ambulatory Visit: Payer: Self-pay | Admitting: Family Medicine

## 2024-03-04 ENCOUNTER — Ambulatory Visit: Admitting: Family Medicine

## 2024-03-04 VITALS — BP 124/78 | Temp 98.0°F | Resp 16 | Ht 65.0 in | Wt 160.0 lb

## 2024-03-04 DIAGNOSIS — E039 Hypothyroidism, unspecified: Secondary | ICD-10-CM | POA: Diagnosis not present

## 2024-03-04 DIAGNOSIS — R32 Unspecified urinary incontinence: Secondary | ICD-10-CM

## 2024-03-04 DIAGNOSIS — E782 Mixed hyperlipidemia: Secondary | ICD-10-CM

## 2024-03-04 DIAGNOSIS — Z79899 Other long term (current) drug therapy: Secondary | ICD-10-CM

## 2024-03-04 DIAGNOSIS — R739 Hyperglycemia, unspecified: Secondary | ICD-10-CM

## 2024-03-04 DIAGNOSIS — E559 Vitamin D deficiency, unspecified: Secondary | ICD-10-CM

## 2024-03-04 DIAGNOSIS — R0683 Snoring: Secondary | ICD-10-CM

## 2024-03-04 DIAGNOSIS — F900 Attention-deficit hyperactivity disorder, predominantly inattentive type: Secondary | ICD-10-CM | POA: Diagnosis not present

## 2024-03-04 DIAGNOSIS — E875 Hyperkalemia: Secondary | ICD-10-CM

## 2024-03-04 LAB — CBC WITH DIFFERENTIAL/PLATELET
Basophils Absolute: 0 K/uL (ref 0.0–0.1)
Basophils Relative: 1.1 % (ref 0.0–3.0)
Eosinophils Absolute: 0.1 K/uL (ref 0.0–0.7)
Eosinophils Relative: 3.3 % (ref 0.0–5.0)
HCT: 39.8 % (ref 36.0–46.0)
Hemoglobin: 13.4 g/dL (ref 12.0–15.0)
Lymphocytes Relative: 39.1 % (ref 12.0–46.0)
Lymphs Abs: 1.5 K/uL (ref 0.7–4.0)
MCHC: 33.7 g/dL (ref 30.0–36.0)
MCV: 98.3 fl (ref 78.0–100.0)
Monocytes Absolute: 0.3 K/uL (ref 0.1–1.0)
Monocytes Relative: 8.5 % (ref 3.0–12.0)
Neutro Abs: 1.8 K/uL (ref 1.4–7.7)
Neutrophils Relative %: 48 % (ref 43.0–77.0)
Platelets: 221 K/uL (ref 150.0–400.0)
RBC: 4.04 Mil/uL (ref 3.87–5.11)
RDW: 12.2 % (ref 11.5–15.5)
WBC: 3.8 K/uL — ABNORMAL LOW (ref 4.0–10.5)

## 2024-03-04 LAB — LIPID PANEL
Cholesterol: 181 mg/dL (ref 0–200)
HDL: 57.6 mg/dL (ref >39.00)
LDL Cholesterol: 112 mg/dL — ABNORMAL HIGH (ref 0–99)
NonHDL: 122.91
Total CHOL/HDL Ratio: 3
Triglycerides: 57 mg/dL (ref 0.0–149.0)
VLDL: 11.4 mg/dL (ref 0.0–40.0)

## 2024-03-04 LAB — COMPREHENSIVE METABOLIC PANEL WITH GFR
ALT: 22 U/L (ref 0–35)
AST: 32 U/L (ref 0–37)
Albumin: 4.3 g/dL (ref 3.5–5.2)
Alkaline Phosphatase: 72 U/L (ref 39–117)
BUN: 15 mg/dL (ref 6–23)
CO2: 31 meq/L (ref 19–32)
Calcium: 9.2 mg/dL (ref 8.4–10.5)
Chloride: 104 meq/L (ref 96–112)
Creatinine, Ser: 0.82 mg/dL (ref 0.40–1.20)
GFR: 70.94 mL/min (ref >60.00)
Glucose, Bld: 91 mg/dL (ref 70–99)
Potassium: 5.4 meq/L — ABNORMAL HIGH (ref 3.5–5.1)
Sodium: 140 meq/L (ref 135–145)
Total Bilirubin: 0.9 mg/dL (ref 0.2–1.2)
Total Protein: 6.5 g/dL (ref 6.0–8.3)

## 2024-03-04 LAB — HEMOGLOBIN A1C: Hgb A1c MFr Bld: 5.3 % (ref 4.6–6.5)

## 2024-03-04 NOTE — Patient Instructions (Signed)
Prevnar 20 vaccine is due

## 2024-03-05 LAB — URINE CULTURE
MICRO NUMBER:: 17200476
Result:: NO GROWTH
SPECIMEN QUALITY:: ADEQUATE

## 2024-03-07 LAB — DRUG MONITORING PANEL 376104, URINE
Amphetamines: NEGATIVE ng/mL (ref <500)
Barbiturates: NEGATIVE ng/mL (ref <300)
Benzodiazepines: NEGATIVE ng/mL (ref <100)
Cocaine Metabolite: NEGATIVE ng/mL (ref <150)
Desmethyltramadol: NEGATIVE ng/mL (ref <100)
Opiates: NEGATIVE ng/mL (ref <100)
Oxycodone: NEGATIVE ng/mL (ref <100)
Tramadol: NEGATIVE ng/mL (ref <100)

## 2024-03-07 LAB — DM TEMPLATE

## 2024-03-08 DIAGNOSIS — S01511A Laceration without foreign body of lip, initial encounter: Secondary | ICD-10-CM | POA: Diagnosis not present

## 2024-03-09 ENCOUNTER — Encounter: Admitting: Physical Therapy

## 2024-03-16 ENCOUNTER — Encounter: Admitting: Physical Therapy

## 2024-03-16 ENCOUNTER — Encounter: Payer: Self-pay | Admitting: Physical Therapy

## 2024-03-16 DIAGNOSIS — M6281 Muscle weakness (generalized): Secondary | ICD-10-CM

## 2024-03-16 DIAGNOSIS — R278 Other lack of coordination: Secondary | ICD-10-CM | POA: Diagnosis not present

## 2024-03-16 NOTE — Therapy (Addendum)
 " OUTPATIENT PHYSICAL THERAPY FEMALE PELVIC TREATMENT   Patient Name: Shannon [name] MRN: [id number] DOB:1950/11/09, 73 y.o., female Today's Date: 03/16/2024  END OF SESSION:  PT End of Session - 03/16/24 1309     Visit Number 4    Date for Recertification  05/13/24    Authorization Type Medicare    Authorization - Visit Number 4    Authorization - Number of Visits 10    PT Start Time 1300    PT Stop Time 1350    PT Time Calculation (min) 50 min    Activity Tolerance Patient tolerated treatment well    Behavior During Therapy [name] for tasks assessed/performed          Past Medical History:  Diagnosis Date   ADHD (attention deficit hyperactivity disorder), 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 Left torn meniscus Right arthritis   Lead exposure 03/02/2018   Lumbar degenerative disc disease 02/24/2017   MRI 2019 confirms multilevel change  Sciatica at times 2/2 this and periodic radiculopathy   Major depressive disorder 03/08/2013   Melanocytic nevi of trunk    Menopause    Neck pain on left side 09/10/2018   Suspect this is DDD and DJD - XR is pending Neurapraxic spasm to left trapezius   Neurofibroma 07/20/2019   Nuclear sclerotic cataract of both eyes 07/23/2012   OAB (overactive bladder) 08/05/2016   Osteopenia    PONV (postoperative nausea and vomiting)    Rosacea    Sciatica of left side 02/24/2017   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   By exam she has chronic tendinopathy of Glut Med   Urinary urgency 09/17/2020   Vertigo 04/12/2019    Vitamin D  deficiency 02/28/2014   Past Surgical History:  Procedure Laterality Date   ANTERIOR AND POSTERIOR REPAIR  05/13/2022   Procedure: ANTERIOR (CYSTOCELE) AND POSTERIOR REPAIR (RECTOCELE), PERINEORRHAPHY;  Surgeon: Marilynne Rosaline SAILOR, MD;  Location: [facility] [place];  Service: Gynecology;;   blephoroplasty     BROW LIFT     CYSTOSCOPY N/A 05/13/2022   Procedure: CYSTOSCOPY;  Surgeon: Marilynne Rosaline SAILOR, MD;  Location: [facility];  Service: Gynecology;  Laterality: N/A;   DILATION AND CURETTAGE OF UTERUS     SKIN CANCER EXCISION     right lower lip   TONSILLECTOMY AND ADENOIDECTOMY     Patient Active Problem List   Diagnosis Date Noted   Traumatic hematoma of right thigh 01/29/2024   Mild cognitive disorder 09/02/2022   ETD (Eustachian tube dysfunction), bilateral 03/05/2022   Hashimoto's thyroiditis 02/14/2022   Constipation 08/23/2021   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   Basal cell carcinoma of face    Benign neoplasm of skin of lower limb    Melanocytic nevi of trunk    Rosacea    ADHD (attention deficit hyperactivity disorder), 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   Urinary incontinence 09/17/2020   History of COVID-19 09/14/2020   Osteopenia 04/11/2020   Neurofibroma 07/20/2019   Vertigo 04/12/2019   Bilateral carotid  bruits 04/12/2019   Neck pain on left side 09/10/2018   Heart murmur 03/02/2018   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   Hyperglycemia 02/24/2017   Lumbar degenerative disc disease 02/24/2017   OAB (overactive bladder) 08/05/2016   Preventative health care 03/25/2016   Hyperlipidemia, mixed 03/11/2016   BMI 29.0-29.9,adult 03/13/2015   Vitamin D  deficiency 02/28/2014   Hypothyroidism 03/08/2013   Major depressive disorder 03/08/2013   Nuclear sclerotic cataract of both eyes 07/23/2012   Knee pain 09/26/2010   Hammer toe, acquired 03/15/2009    PCP: Domenica Harlene LABOR MD  REFERRING PROVIDER: Marilynne Rosaline SAILOR,  MD   REFERRING DIAG: [phone] (ICD-10-CM) - Chronic idiopathic constipation   THERAPY DIAG:  Other lack of coordination  Muscle weakness (generalized)  Rationale for Evaluation and Treatment: Rehabilitation  ONSET DATE: 05/13/22  SUBJECTIVE:                                                                                                                                                                                           SUBJECTIVE STATEMENT: Changed Metamucil that she is taking and bowel movements are firmer. Still trying to figure out the correct dose.    Fluid intake: Drinking 1-2 cups of coffee per day   FUNCTIONAL LIMITATIONS: none  PERTINENT HISTORY:  Medications for current condition:  Surgeries: Anterior and posterior repair with perineorrhaphy, cystoscopy on 05/13/22  Other: Basil carcinoma; Diverticulosis; diabetes; Hypothyroidism; Ostepenia   PAIN:  Are you having pain? No   PRECAUTIONS: Other: skin cancer  RED FLAGS: None   WEIGHT BEARING RESTRICTIONS: No  FALLS:  Has patient fallen in last 6 months? Yes. Number of falls 1 time while hiking and not due to balance  OCCUPATION: retired  ACTIVITY LEVEL : not right now due to moving.   PLOF: Independent  PATIENT GOALS: reduce urinary leakage and improve pushing out of stool.    BOWEL MOVEMENT: Pain with bowel movement: No Type of bowel movement:Type (Bristol Stool Scale) Type 4, Frequency daily but not always a big one, Strain sometimes with a little piece comes out, and Splinting yes on the perineal body Fully empty rectum: Nowhen she wipes the stool is still on the toilet paper.  Leakage: No                                                     Pads: No Fiber supplement/laxative miralax   URINATION: Pain with urination: No Fully empty bladder: Yes:  Post-void dribble: No Stream: Strong Urgency: No Frequency:during the day every 2-3 hours                                                          Nocturia: No   Leakage: Coughing, Sneezing, Laughing, Exercise, and Lifting Pads/briefs: Yes: 1-4 during day and none at night  INTERCOURSE:not active   PREGNANCY: Vaginal deliveries 2 Tearing Yes:    PROLAPSE: None   OBJECTIVE:  Note: Objective measures were completed at Evaluation unless otherwise noted.  DIAGNOSTIC FINDINGS:  none    COGNITION: Overall cognitive status: Within functional limits for tasks assessed     SENSATION: Light touch: Appears intact    FUNCTIONAL TESTS:  Single leg stance:  Mu:pwrmzjdzi trunk sway  Ou:pwrmzjdzi trunk sway   POSTURE: rounded shoulders, forward head, increased thoracic kyphosis, and posterior pelvic tilt   LUMBARAROM/PROM: lumbar ROM decreased by 25%   LOWER EXTREMITY MNF:apojuzmjo hip ROM is full   LOWER EXTREMITY MMT:  MMT Right eval Left eval  Hip abduction 3+/5 3+/5   (Blank rows = not tested) PALPATION:   Abdominal:   Diastasis: No Distortion: No  Breathing: able to expand the lower rib cage Scar tissue: No                External Perineal Exam: some tenderness on the posterior rectal area                             Internal Pelvic Floor: decreased movement of the anococcygeal ligament, decreased contraction of the right side of the rectum  Patient confirms identification and approves PT to assess internal pelvic floor and treatment Yes No emotional/communication barriers or cognitive limitation. Patient is motivated to learn. Patient understands and agrees with treatment goals and plan. PT explains patient will be examined in standing, sitting, and lying down to see how their muscles and joints work. When they are ready, they will be asked to remove their underwear so PT can examine their perineum. The patient is also given the option of providing their own chaperone as one is not provided in our facility. The patient also has the right and is explained the right to defer or  refuse any part of the evaluation or treatment including the internal exam. With the patient's consent, PT will use one gloved finger to gently assess the muscles of the pelvic floor, seeing how well it contracts and relaxes and if there is muscle symmetry. After, the patient will get dressed and PT and patient will discuss exam findings and plan of care. PT and patient discuss plan of care, schedule, attendance policy and HEP activities.   PELVIC MMT:   MMT eval  Vaginal   Internal Anal Sphincter 3/5 4 sec  External Anal Sphincter 3/5 4 sec  Puborectalis 3/5 4 sec  (Blank rows = not tested)        TONE: Average tone  PROLAPSE: none  TODAY'S TREATMENT:     03/16/24 Neuromuscular re-education: Pelvic floor contraction training: Sitting slightly back contracting the anus holding 10 sec 10 x  Bridge 15 x  Supine marching 20 x  Self-care: Discussed with patient on different fiber supplements and how to reduce the amount to have firmer stools.  Discussed how she  is pushing the stool out and keeping the correct consistency of the stool      03/02/24 Neuromuscular re-education: Form correction: Bridge with ball between knees, press yoga block between hands, toes up Supine march with holding the yoga block between hands Supine with ball squeeze moving arms up and down squeezing the yoga block between hands Therapeutic activities: Functional strengthening activities: Educated patient on having a bowel movement with her knees above hips, diaphragmatic breathing, breathing out to push stool out and contract the pelvic floor afterwards.     02/24/24 Manual: Soft tissue mobilization: Circular massage to the abdomen to promote peristalic motion of the intestines and diaphragm to work on diaphragmatic breathing Myofascial release: Tissue rolling of the abdomen and lower rib cage Release along the lateral abdomen where the ureters are Release of the lower abdomen  Neuromuscular  re-education: Pelvic floor contraction training: Transverse abdominus contraction with ball squeeze and pelvic floor contraction 20 x Down training: Diaphragmatic breathing with therapist assisting the movement of the rib cage                                                                                                                            DATE: 02/19/24  EVAL Examination completed, findings reviewed, pt educated on POC, HEP, and female pelvic floor anatomy, reasoning with pelvic floor assessment internally with pt consent, and abdominal massage. Pt motivated to participate in PT and agreeable to attempt recommendations.     PATIENT EDUCATION:  03/02/24 Education details: Access Code: [email or web address],; education on how to have a bowel movement Person educated: Patient Education method: Explanation, Demonstration, Tactile cues, Verbal cues, and Handouts Education comprehension: verbalized understanding, returned demonstration, verbal cues required, tactile cues required, and needs further education  HOME EXERCISE PROGRAM: 03/02/24 Access Code: [email or web address] URL: [email or web address] Date: 03/02/2024 Prepared by: Channing Pereyra  Exercises - Seated Pelvic Floor Contraction  - 3 x daily - 7 x weekly - 1 sets - 10 reps - 5 sec hold - Seated Quick Flick Pelvic Floor Contractions  - 3 x daily - 7 x weekly - 1 sets - 10 reps - Supine Diaphragmatic Breathing  - 1 x daily - 7 x weekly - 3 sets - 10 reps - Seated Diaphragmatic Breathing  - 1 x daily - 7 x weekly - 3 sets - 10 reps - Pelvic Floor Contractions in Hooklying with Adduction  - 1 x daily - 3 x weekly - 2 sets - 10 reps - Supine Bridge with Mini Swiss Ball Between Knees  - 1 x daily - 3 x weekly - 2 sets - 10 reps - Supine March  - 1 x daily - 3 x weekly - 2 sets - 10 reps  Patient Education - Abdominal Massage for Constipation - High-Fiber Diet to Support Pelvic Health  ASSESSMENT:  CLINICAL IMPRESSION: Patient is a 73  y.o. female who was seen today for physical therapy  treatment for constipation. Patient is  able to  push her stool out better due to having the stool firmer. She is chaning the amount of stool she takes to have the stool firmer. She will have stool come out when she urinates. She is in th eprocess of moving and the holidays are here so she would like to return to therapy in January to work on her urinary leakage. Patient would benefit from skilled therapy to improve strength and endurance of the pelvic floor muscles.   OBJECTIVE IMPAIRMENTS: decreased activity tolerance, decreased coordination, decreased endurance, decreased ROM, decreased strength, and increased fascial restrictions.   ACTIVITY LIMITATIONS: continence and toileting  PARTICIPATION LIMITATIONS: driving, shopping, and community activity  PERSONAL FACTORS: 1-2 comorbidities: Anterior and posterior repair with perineorrhaphy, cystoscopy on 05/13/22  are also affecting patient's functional outcome.   REHAB POTENTIAL: Excellent  CLINICAL DECISION MAKING: Evolving/moderate complexity  EVALUATION COMPLEXITY: Moderate   GOALS: Goals reviewed with patient? Yes  SHORT TERM GOALS: Target date: 03/18/24  Patient is able to perform diaphragmatic breathing to lengthen the pelvic floor.  Baseline: Goal status: Met 02/24/24  2.  Patient educated on abdominal massage.  Baseline:  Goal status: Met 02/24/24  3.  Patient educated on how to sit on the commode to have a bowel movement.  Baseline:  Goal status: Met 03/02/24   LONG TERM GOALS: Target date: 05/13/24  Patient is independent with her advanced HEP For pelvic floor strength and edurance.  Baseline:  Goal status: Ongoing 03/16/24  2.  Patient is able to generate enough pressure to push stool out of the rectum fully 4 out of 6 times.  Baseline:  Goal status: ongoing 03/16/24  3.  Patient is able to lift items without leaking urine with reduction of pressure on the pelvic  floor.  Baseline:  Goal status: ongoing 03/16/24  4.  Patient is able to quickly contract the pelvic floor to reduce the amount of urinary leakage with coughing, sneezing and laughing.  Baseline:  Goal status: ongoing 03/16/24   PLAN:  PT FREQUENCY: 1-2x/week  PT DURATION: 12 weeks  PLANNED INTERVENTIONS: 97110-Therapeutic exercises, 97530- Therapeutic activity, 97112- Neuromuscular re-education, 97535- Self Care, [id number]- Manual therapy, Patient/Family education, Joint mobilization, Spinal mobilization, and Biofeedback  PLAN FOR NEXT SESSION: core strength. Isolating pelvic floor contraction, lifting with pelvic floor engaged; see patient on 05/10/24 and work on her urinary leakage.    Channing Pereyra, PT 03/16/24 2:02 PM  PHYSICAL THERAPY DISCHARGE SUMMARY  Visits from Start of Care: 4  Current functional level related to goals / functional outcomes: Patient messaged physical therapist for her to be discharged due to moving to [facility]  and will receive services there.    Remaining deficits: See above.    Education / Equipment: HEP   Patient agrees to discharge. Patient goals were not met. Patient is being discharged due to the patient's request. Thank you for the referral.   Channing Pereyra, PT 05/07/2024 9:29 AM   "

## 2024-03-18 ENCOUNTER — Other Ambulatory Visit (INDEPENDENT_AMBULATORY_CARE_PROVIDER_SITE_OTHER)

## 2024-03-18 DIAGNOSIS — E875 Hyperkalemia: Secondary | ICD-10-CM

## 2024-03-18 LAB — COMPREHENSIVE METABOLIC PANEL WITH GFR
ALT: 16 U/L (ref 0–35)
AST: 24 U/L (ref 0–37)
Albumin: 4.4 g/dL (ref 3.5–5.2)
Alkaline Phosphatase: 64 U/L (ref 39–117)
BUN: 16 mg/dL (ref 6–23)
CO2: 32 meq/L (ref 19–32)
Calcium: 9.3 mg/dL (ref 8.4–10.5)
Chloride: 104 meq/L (ref 96–112)
Creatinine, Ser: 0.85 mg/dL (ref 0.40–1.20)
GFR: 67.92 mL/min (ref >60.00)
Glucose, Bld: 97 mg/dL (ref 70–99)
Potassium: 5.3 meq/L — ABNORMAL HIGH (ref 3.5–5.1)
Sodium: 139 meq/L (ref 135–145)
Total Bilirubin: 0.9 mg/dL (ref 0.2–1.2)
Total Protein: 7 g/dL (ref 6.0–8.3)

## 2024-03-19 ENCOUNTER — Ambulatory Visit: Payer: Self-pay | Admitting: Family Medicine

## 2024-03-19 DIAGNOSIS — E875 Hyperkalemia: Secondary | ICD-10-CM

## 2024-03-31 ENCOUNTER — Ambulatory Visit (INDEPENDENT_AMBULATORY_CARE_PROVIDER_SITE_OTHER)

## 2024-03-31 ENCOUNTER — Encounter: Payer: Self-pay | Admitting: Family Medicine

## 2024-03-31 ENCOUNTER — Encounter (INDEPENDENT_AMBULATORY_CARE_PROVIDER_SITE_OTHER): Payer: Self-pay

## 2024-03-31 ENCOUNTER — Institutional Professional Consult (permissible substitution) (INDEPENDENT_AMBULATORY_CARE_PROVIDER_SITE_OTHER)

## 2024-03-31 VITALS — BP 111/74 | HR 61 | Temp 97.9°F | Ht 65.0 in | Wt 157.0 lb

## 2024-03-31 DIAGNOSIS — J342 Deviated nasal septum: Secondary | ICD-10-CM | POA: Diagnosis not present

## 2024-03-31 DIAGNOSIS — R0683 Snoring: Secondary | ICD-10-CM | POA: Diagnosis not present

## 2024-04-01 NOTE — Progress Notes (Signed)
 Dear Dr. Domenica, Here is my assessment for our mutual patient, Shannon [name]. Thank you for allowing me the opportunity to care for your patient. Please do not hesitate to contact me should you have any other questions. Sincerely, Dr. Hadassah Parody  Otolaryngology Clinic Note Referring provider: Dr. Domenica HPI:   Initial HPI (03/31/2024) Discussed the use of AI scribe software for clinical note transcription with the patient, who gave verbal consent to proceed.  History of Present Illness Shannon [name] is a 73 year old female who presents with snoring.  Snoring - Snoring present for approximately ten years - Snoring confirmed by family and friends and occurs in all sleep positions  - No symptoms of sleep apnea, including no waking up choking or gasping for air, and no observed apneic episodes. No tiredness during day and feels like gets good nights rest.  - Occasional teeth grinding and tiredness, attributed to stress and poor sleep quality - Breathe Right strips used without significant improvement - Nasal device used, uncomfortable especially on the side suspected of deviated septum - Sleep app used to track snoring duration and volume, with noted improvement when using nasal device - device opens internal nasal valve   Nasal obstruction and septal deviation - Suspected deviated septum - mild obstruction  - No history of nasal trauma    Independent Review of Additional Tests or Records:  Referral note Harlene Domenica, MD 03/04/24: pt with snoring, refer to ENT   Hemoglobin A1c 03/04/2024: 5.3  CBC 03/04/2024: Hemoglobin 13.4  PMH/Meds/All/SocHx/FamHx/ROS:   Past Medical History:  Diagnosis Date   ADHD (attention deficit hyperactivity disorder), 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 Left torn meniscus Right arthritis   Lead exposure 03/02/2018   Lumbar degenerative disc disease 02/24/2017   MRI 2019 confirms multilevel change  Sciatica at times 2/2 this and periodic radiculopathy   Major depressive disorder 03/08/2013   Melanocytic nevi of trunk    Menopause    Neck pain on left side 09/10/2018   Suspect this is DDD and DJD - XR is pending Neurapraxic spasm to left trapezius   Neurofibroma 07/20/2019   Nuclear sclerotic cataract of both eyes 07/23/2012   OAB (overactive bladder) 08/05/2016   Osteopenia    PONV (postoperative nausea and vomiting)    Rosacea    Sciatica of left side 02/24/2017   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   By exam she has chronic tendinopathy of Glut Med   Urinary urgency 09/17/2020   Vertigo 04/12/2019   Vitamin D  deficiency 02/28/2014     Past Surgical History:  Procedure Laterality Date   ANTERIOR AND POSTERIOR REPAIR  05/13/2022   Procedure: ANTERIOR (CYSTOCELE) AND POSTERIOR REPAIR (RECTOCELE), PERINEORRHAPHY;  Surgeon: Marilynne Rosaline SAILOR, MD;  Location: [facility];  Service: Gynecology;;   blephoroplasty     BROW LIFT     CYSTOSCOPY N/A 05/13/2022   Procedure: CYSTOSCOPY;  Surgeon: Marilynne Rosaline SAILOR, MD;  Location: [facility];  Service: Gynecology;  Laterality: N/A;   DILATION AND CURETTAGE OF UTERUS     SKIN CANCER EXCISION     right lower lip   TONSILLECTOMY AND ADENOIDECTOMY      Family History  Problem Relation Age of Onset   Colon polyps Mother    Parkinson's disease Mother        developed Parkinson's disease dementia   Diabetes Father    Emphysema Father    Tuberculosis Father    Alcohol abuse Sister    Cancer Sister        skin cancer, melanoma  cell, basil cell   Cancer Sister        melanoma   Cancer - Ovarian Sister    Anxiety disorder Brother    Parkinson's disease Maternal Grandmother        developed Parkinson's disease dementia   Colon cancer Maternal Grandfather    ADD / ADHD Daughter    Anxiety disorder Daughter    Depression Daughter    ADD / ADHD Son    Anxiety disorder Son    Depression Son    Bladder Cancer Neg Hx    Uterine cancer Neg Hx      Social Connections: Moderately Integrated (03/04/2024)   Social Connection and Isolation Panel    Frequency of Communication with Friends and Family: Three times a week    Frequency of Social Gatherings with Friends and Family: Twice a week    Attends Religious Services: More than 4 times per year    Active Member of [employer] or Organizations: Yes    Attends Engineer, Structural: More than 4 times per year    Marital Status: Divorced     Current Outpatient Medications  Medication Instructions   escitalopram  (LEXAPRO ) 15 mg, Oral, Daily   estradiol  (ESTRACE ) 0.1 MG/GM vaginal cream INSERT ONE APPLICATORFUL VAGINALLY TWICE A WEEK AT BEDTIME   gabapentin  (NEURONTIN ) 300 mg, Oral, 3 times daily PRN   GAMMA AMINOBUTYRIC ACID PO 200 mg, As needed   Krill Oil 1000 MG CAPS No dose, route, or frequency recorded.   Lactobacillus Acidophilus (ACIDOPHILUS LACTOBACILLUS) 10 BIL UNT/GM POWD 1 capsule, Daily   lisdexamfetamine (VYVANSE ) 20 mg, Daily   LORazepam  (ATIVAN ) 2 mg, Oral, Every 8 hours PRN   MAGNESIUM GLYCINATE PLUS PO 100 mg, 2 times daily   TIROSINT  88 MCG CAPS TAKE ONE Capsule BY MOUTH EVERY DAY BEFORE BREAKFAST   tretinoin (RETIN-A) 0.025 %, Daily     Physical Exam:   BP 111/74 (BP Location: Left Arm, Patient Position: Sitting)   Pulse 61   Temp 97.9 F (36.6 C)   Ht 5' 5 (1.651 m)   Wt 157 lb (71.2 kg)   LMP 03/29/2004   SpO2 90%   BMI 26.13 kg/m   Salient findings:  CN II-XII intact Bilateral EAC clear and TM intact with well pneumatized  middle ear spaces Anterior rhinoscopy: Septum deviated left; bilateral inferior turbinates with mild hypertrophy  No lesions of oral cavity/oropharynx No obviously palpable neck masses/lymphadenopathy/thyromegaly No respiratory distress or stridor TFL was indicated to better evaluate the proximal airway, given the patient's history and exam findings, and is detailed below.   Seprately Identifiable Procedures:  Prior to initiating any procedures, risks/benefits/alternatives were explained to the patient and verbal consent obtained.  Procedure Note (03/31/2024) Pre-procedure diagnosis: snoring/ concern for airway obstruction Post-procedure diagnosis: Same Procedure: Transnasal Fiberoptic Laryngoscopy, CPT 31575 - Mod 25 Indication: snoring/ concern for airway obstruction Complications: None apparent EBL: 0 mL  The procedure was undertaken to further evaluate the patient's complaint of snoring/ concern for airway obstruction, with mirror exam inadequate for appropriate examination due to gag reflex and poor patient tolerance  Procedure:  Patient was identified as correct patient. Verbal consent was obtained. The nose was sprayed with oxymetazoline and 4% lidocaine . The The flexible laryngoscope was passed through the nose to view the nasal cavity, pharynx (oropharynx, hypopharynx) and larynx.  The larynx was examined at rest and during multiple phonatory tasks. Documentation was obtained and reviewed with patient. The scope was removed. The patient tolerated the procedure well.  Findings: The nasal cavity and nasopharynx did not reveal any masses or lesions, mucosa appeared to be without obvious lesions. The tongue base, pharyngeal walls, piriform sinuses, vallecula, epiglottis and postcricoid region are normal in appearance EXCEPT: easy soft palate collapse posteriorly when scoping. Adenoids not present. The visualized portion of the subglottis and proximal trachea is widely patent. The vocal  folds are mobile bilaterally. There are no lesions on the free edge of the vocal folds nor elsewhere in the larynx worrisome for malignancy.    Electronically signed by: Hadassah JAYSON Parody, MD 04/01/2024 4:19 PM   Impression & Plans:  Maanya Kille is a 73 y.o. female with   1. Snoring   2. Nasal septal deviation     Assessment and Plan Assessment & Plan Snoring Chronic snoring, likely due to soft palate collapse during sleep. No evidence of sleep apnea as there are no episodes of choking or apnea during sleep. Snoring is loud and rhythmic, but not associated with sleep apnea symptoms. Nasal strips provide some relief but are uncomfortable.  Reports her orthodontist Zohra provide an appliance to help with snoring, and I think this is a good option if this could help her.  surgical intervention for soft palate is not recommended due to limited efficacy. - Discuss with orthodontist about potential appliance. - Consider using nasal appliance which helps open her internal nasal valve if comfortable. - Monitor for signs of sleep apnea, such as choking or apnea during sleep. - If sleep apnea is suspected, will consider sleep study.  Deviated nasal septum Mild deviated nasal septum noted, but not the primary cause of snoring. Septum correction Sarafina improve nasal breathing but is unlikely to resolve snoring. Nasal breathing is generally adequate bilaterally. - Monitor for any changes in nasal breathing. - Will consider septum correction if nasal breathing becomes problematic.    See below regarding exact medications prescribed this encounter including dosages and route: No orders of the defined types were placed in this encounter.     Thank you for allowing me the opportunity to care for your patient. Please do not hesitate to contact me should you have any other questions.  Sincerely, Hadassah Parody, MD Otolaryngologist (ENT), [facility] Phone: [phone] Fax:  [phone]  MDM:  Level 4 Complexity/Problems addressed: 4- two chronic stable problems Data complexity: 4-independent review of 1 note, 2 labs - Morbidity: Low - Prescription Drug prescribed or managed: No

## 2024-04-13 DIAGNOSIS — M542 Cervicalgia: Secondary | ICD-10-CM | POA: Diagnosis not present

## 2024-04-13 DIAGNOSIS — M5441 Lumbago with sciatica, right side: Secondary | ICD-10-CM | POA: Diagnosis not present

## 2024-04-26 ENCOUNTER — Other Ambulatory Visit: Payer: Self-pay | Admitting: Internal Medicine

## 2024-05-10 ENCOUNTER — Ambulatory Visit: Payer: Self-pay | Admitting: Physical Therapy

## 2024-05-10 ENCOUNTER — Encounter: Payer: Self-pay | Admitting: *Deleted

## 2024-05-12 ENCOUNTER — Encounter: Payer: Self-pay | Admitting: Family Medicine

## 2024-05-18 ENCOUNTER — Encounter: Payer: Self-pay | Admitting: Family Medicine

## 2024-05-21 ENCOUNTER — Other Ambulatory Visit: Payer: Self-pay | Admitting: *Deleted

## 2024-05-21 ENCOUNTER — Other Ambulatory Visit

## 2024-05-21 DIAGNOSIS — E875 Hyperkalemia: Secondary | ICD-10-CM

## 2024-05-21 DIAGNOSIS — Z0000 Encounter for general adult medical examination without abnormal findings: Secondary | ICD-10-CM

## 2024-05-21 LAB — COMPLETE METABOLIC PANEL WITHOUT GFR
AG Ratio: 1.7 (calc) (ref 1.0–2.5)
ALT: 11 U/L (ref 6–29)
AST: 20 U/L (ref 10–35)
Albumin: 4.2 g/dL (ref 3.6–5.1)
Alkaline phosphatase (APISO): 64 U/L (ref 37–153)
BUN: 15 mg/dL (ref 7–25)
CO2: 29 mmol/L (ref 20–32)
Calcium: 9 mg/dL (ref 8.6–10.4)
Chloride: 103 mmol/L (ref 98–110)
Creat: 0.87 mg/dL (ref 0.60–1.00)
Globulin: 2.5 g/dL (ref 1.9–3.7)
Glucose, Bld: 96 mg/dL (ref 65–99)
Potassium: 4.4 mmol/L (ref 3.5–5.3)
Sodium: 140 mmol/L (ref 135–146)
Total Bilirubin: 0.8 mg/dL (ref 0.2–1.2)
Total Protein: 6.7 g/dL (ref 6.1–8.1)

## 2024-05-24 ENCOUNTER — Ambulatory Visit: Payer: Self-pay | Admitting: Family

## 2024-05-27 ENCOUNTER — Encounter: Payer: Self-pay | Admitting: Family Medicine

## 2024-05-28 MED ORDER — ESCITALOPRAM OXALATE 10 MG PO TABS: 15.0000 mg | ORAL_TABLET | Freq: Every day | ORAL | 1 refills | Status: AC

## 2024-08-26 ENCOUNTER — Ambulatory Visit: Admitting: Internal Medicine

## 2024-09-02 ENCOUNTER — Ambulatory Visit: Admitting: Family Medicine

## 2024-09-09 ENCOUNTER — Ambulatory Visit: Admitting: Family Medicine

## 2024-09-16 ENCOUNTER — Ambulatory Visit

## 2024-09-17 ENCOUNTER — Ambulatory Visit
